# Patient Record
Sex: Female | Born: 1970 | Race: Black or African American | Hispanic: No | Marital: Married | Smoking: Never smoker
Health system: Southern US, Community
[De-identification: ages and names within clinical notes are randomized; demographics above are authoritative.]

## PROBLEM LIST (undated history)

## (undated) DIAGNOSIS — F5104 Psychophysiologic insomnia: Secondary | ICD-10-CM

## (undated) DIAGNOSIS — G47419 Narcolepsy without cataplexy: Secondary | ICD-10-CM

## (undated) DIAGNOSIS — Z8669 Personal history of other diseases of the nervous system and sense organs: Secondary | ICD-10-CM

## (undated) HISTORY — DX: Narcolepsy without cataplexy: G47.419

## (undated) HISTORY — PX: OTHER SURGICAL HISTORY: SHX169

## (undated) HISTORY — DX: Psychophysiologic insomnia: F51.04

## (undated) HISTORY — DX: Personal history of other diseases of the nervous system and sense organs: Z86.69

---

## 1999-12-24 ENCOUNTER — Ambulatory Visit (HOSPITAL_BASED_OUTPATIENT_CLINIC_OR_DEPARTMENT_OTHER): Admission: RE | Admit: 1999-12-24 | Discharge: 1999-12-24 | Payer: Self-pay | Admitting: Internal Medicine

## 2000-01-05 ENCOUNTER — Encounter (INDEPENDENT_AMBULATORY_CARE_PROVIDER_SITE_OTHER): Payer: Self-pay | Admitting: Specialist

## 2000-01-05 ENCOUNTER — Other Ambulatory Visit: Admission: RE | Admit: 2000-01-05 | Discharge: 2000-01-05 | Payer: Self-pay | Admitting: Obstetrics and Gynecology

## 2000-10-25 ENCOUNTER — Other Ambulatory Visit: Admission: RE | Admit: 2000-10-25 | Discharge: 2000-10-25 | Payer: Self-pay | Admitting: Obstetrics and Gynecology

## 2000-11-04 ENCOUNTER — Encounter: Payer: Self-pay | Admitting: Obstetrics and Gynecology

## 2000-11-04 ENCOUNTER — Ambulatory Visit (HOSPITAL_COMMUNITY): Admission: RE | Admit: 2000-11-04 | Discharge: 2000-11-04 | Payer: Self-pay | Admitting: Obstetrics and Gynecology

## 2000-11-29 ENCOUNTER — Ambulatory Visit (HOSPITAL_COMMUNITY): Admission: RE | Admit: 2000-11-29 | Discharge: 2000-11-29 | Payer: Self-pay | Admitting: Obstetrics & Gynecology

## 2000-11-29 ENCOUNTER — Encounter: Payer: Self-pay | Admitting: Obstetrics & Gynecology

## 2001-04-28 ENCOUNTER — Inpatient Hospital Stay (HOSPITAL_COMMUNITY): Admission: AD | Admit: 2001-04-28 | Discharge: 2001-04-28 | Payer: Self-pay | Admitting: Obstetrics and Gynecology

## 2001-05-03 ENCOUNTER — Inpatient Hospital Stay (HOSPITAL_COMMUNITY): Admission: AD | Admit: 2001-05-03 | Discharge: 2001-05-06 | Payer: Self-pay | Admitting: Obstetrics and Gynecology

## 2002-11-27 ENCOUNTER — Other Ambulatory Visit: Admission: RE | Admit: 2002-11-27 | Discharge: 2002-11-27 | Payer: Self-pay | Admitting: Family Medicine

## 2004-08-12 ENCOUNTER — Other Ambulatory Visit: Admission: RE | Admit: 2004-08-12 | Discharge: 2004-08-12 | Payer: Self-pay | Admitting: Family Medicine

## 2005-03-05 ENCOUNTER — Ambulatory Visit: Payer: Self-pay | Admitting: Internal Medicine

## 2005-10-09 ENCOUNTER — Ambulatory Visit: Payer: Self-pay | Admitting: Internal Medicine

## 2007-01-13 ENCOUNTER — Ambulatory Visit: Payer: Self-pay | Admitting: Internal Medicine

## 2007-03-01 DIAGNOSIS — G47419 Narcolepsy without cataplexy: Secondary | ICD-10-CM | POA: Insufficient documentation

## 2007-03-01 DIAGNOSIS — G43909 Migraine, unspecified, not intractable, without status migrainosus: Secondary | ICD-10-CM | POA: Insufficient documentation

## 2007-03-01 DIAGNOSIS — G4700 Insomnia, unspecified: Secondary | ICD-10-CM | POA: Insufficient documentation

## 2007-04-22 ENCOUNTER — Telehealth: Payer: Self-pay | Admitting: Internal Medicine

## 2007-07-07 ENCOUNTER — Telehealth: Payer: Self-pay | Admitting: Internal Medicine

## 2007-09-12 ENCOUNTER — Telehealth (INDEPENDENT_AMBULATORY_CARE_PROVIDER_SITE_OTHER): Payer: Self-pay | Admitting: *Deleted

## 2007-09-21 ENCOUNTER — Encounter: Payer: Self-pay | Admitting: Internal Medicine

## 2007-11-03 ENCOUNTER — Emergency Department (HOSPITAL_COMMUNITY): Admission: EM | Admit: 2007-11-03 | Discharge: 2007-11-03 | Payer: Self-pay | Admitting: Family Medicine

## 2007-11-08 ENCOUNTER — Emergency Department (HOSPITAL_COMMUNITY): Admission: EM | Admit: 2007-11-08 | Discharge: 2007-11-08 | Payer: Self-pay | Admitting: Emergency Medicine

## 2007-11-14 ENCOUNTER — Other Ambulatory Visit: Admission: RE | Admit: 2007-11-14 | Discharge: 2007-11-14 | Payer: Self-pay | Admitting: Family Medicine

## 2007-12-05 ENCOUNTER — Telehealth (INDEPENDENT_AMBULATORY_CARE_PROVIDER_SITE_OTHER): Payer: Self-pay | Admitting: *Deleted

## 2008-01-12 ENCOUNTER — Ambulatory Visit: Payer: Self-pay | Admitting: Internal Medicine

## 2008-01-18 ENCOUNTER — Telehealth (INDEPENDENT_AMBULATORY_CARE_PROVIDER_SITE_OTHER): Payer: Self-pay | Admitting: *Deleted

## 2008-02-10 ENCOUNTER — Encounter: Payer: Self-pay | Admitting: Internal Medicine

## 2008-03-21 ENCOUNTER — Telehealth (INDEPENDENT_AMBULATORY_CARE_PROVIDER_SITE_OTHER): Payer: Self-pay | Admitting: *Deleted

## 2008-06-04 ENCOUNTER — Telehealth: Payer: Self-pay | Admitting: Internal Medicine

## 2008-07-23 ENCOUNTER — Telehealth (INDEPENDENT_AMBULATORY_CARE_PROVIDER_SITE_OTHER): Payer: Self-pay | Admitting: *Deleted

## 2008-07-30 ENCOUNTER — Telehealth (INDEPENDENT_AMBULATORY_CARE_PROVIDER_SITE_OTHER): Payer: Self-pay | Admitting: *Deleted

## 2008-09-18 ENCOUNTER — Telehealth: Payer: Self-pay | Admitting: Internal Medicine

## 2008-10-02 ENCOUNTER — Telehealth (INDEPENDENT_AMBULATORY_CARE_PROVIDER_SITE_OTHER): Payer: Self-pay | Admitting: *Deleted

## 2008-10-17 ENCOUNTER — Telehealth: Payer: Self-pay | Admitting: Internal Medicine

## 2008-12-12 ENCOUNTER — Telehealth (INDEPENDENT_AMBULATORY_CARE_PROVIDER_SITE_OTHER): Payer: Self-pay | Admitting: *Deleted

## 2009-02-18 ENCOUNTER — Telehealth: Payer: Self-pay | Admitting: Internal Medicine

## 2009-02-21 ENCOUNTER — Ambulatory Visit: Payer: Self-pay | Admitting: Internal Medicine

## 2009-02-22 ENCOUNTER — Telehealth: Payer: Self-pay | Admitting: Internal Medicine

## 2009-03-07 ENCOUNTER — Telehealth: Payer: Self-pay | Admitting: Internal Medicine

## 2009-03-11 ENCOUNTER — Encounter: Payer: Self-pay | Admitting: Internal Medicine

## 2009-03-15 ENCOUNTER — Encounter: Payer: Self-pay | Admitting: Internal Medicine

## 2009-03-25 ENCOUNTER — Ambulatory Visit: Payer: Self-pay | Admitting: Internal Medicine

## 2009-04-29 ENCOUNTER — Telehealth: Payer: Self-pay | Admitting: Internal Medicine

## 2009-05-07 ENCOUNTER — Ambulatory Visit (HOSPITAL_COMMUNITY): Admission: RE | Admit: 2009-05-07 | Discharge: 2009-05-07 | Payer: Self-pay | Admitting: Obstetrics and Gynecology

## 2009-05-31 ENCOUNTER — Telehealth (INDEPENDENT_AMBULATORY_CARE_PROVIDER_SITE_OTHER): Payer: Self-pay | Admitting: *Deleted

## 2009-06-11 ENCOUNTER — Telehealth (INDEPENDENT_AMBULATORY_CARE_PROVIDER_SITE_OTHER): Payer: Self-pay | Admitting: *Deleted

## 2009-06-24 ENCOUNTER — Telehealth (INDEPENDENT_AMBULATORY_CARE_PROVIDER_SITE_OTHER): Payer: Self-pay | Admitting: *Deleted

## 2009-07-08 ENCOUNTER — Telehealth: Payer: Self-pay | Admitting: Internal Medicine

## 2009-08-05 ENCOUNTER — Ambulatory Visit: Payer: Self-pay | Admitting: Internal Medicine

## 2009-09-18 ENCOUNTER — Telehealth (INDEPENDENT_AMBULATORY_CARE_PROVIDER_SITE_OTHER): Payer: Self-pay | Admitting: *Deleted

## 2009-10-07 ENCOUNTER — Telehealth (INDEPENDENT_AMBULATORY_CARE_PROVIDER_SITE_OTHER): Payer: Self-pay | Admitting: *Deleted

## 2009-12-24 ENCOUNTER — Telehealth (INDEPENDENT_AMBULATORY_CARE_PROVIDER_SITE_OTHER): Payer: Self-pay | Admitting: *Deleted

## 2010-02-07 ENCOUNTER — Telehealth: Payer: Self-pay | Admitting: Internal Medicine

## 2010-02-10 ENCOUNTER — Telehealth: Payer: Self-pay | Admitting: Internal Medicine

## 2010-03-15 ENCOUNTER — Encounter: Payer: Self-pay | Admitting: Pulmonary Disease

## 2010-03-17 ENCOUNTER — Telehealth: Payer: Self-pay | Admitting: Internal Medicine

## 2010-03-17 ENCOUNTER — Telehealth (INDEPENDENT_AMBULATORY_CARE_PROVIDER_SITE_OTHER): Payer: Self-pay | Admitting: *Deleted

## 2010-03-18 ENCOUNTER — Ambulatory Visit: Payer: Self-pay | Admitting: Internal Medicine

## 2010-06-05 ENCOUNTER — Telehealth (INDEPENDENT_AMBULATORY_CARE_PROVIDER_SITE_OTHER): Payer: Self-pay | Admitting: *Deleted

## 2010-06-16 ENCOUNTER — Ambulatory Visit: Admit: 2010-06-16 | Payer: Self-pay | Admitting: Internal Medicine

## 2010-06-17 ENCOUNTER — Telehealth: Payer: Self-pay | Admitting: Internal Medicine

## 2010-06-17 NOTE — Letter (Signed)
Summary: [employer]  [address]   [place], [place] [address]   Phone: [phone]  Fax: [phone]    Today's Date: August 05, 2009  Name of Patient: Denise Herrera  The above named patient had a medical visit today at:  1:30 pm.  Please take this into consideration when reviewing the time away from work.    Special Instructions:  [ X ] None  [  ] To be off the remainder of today, returning to the normal work / school schedule tomorrow.  [  ] To be off until the next scheduled appointment on ______________________.  [  ] Other ________________________________________________________________ ________________________________________________________________________   Sincerely yours,     Jetty Duhamel, M.D.

## 2010-06-17 NOTE — Progress Notes (Signed)
Summary: nos appt  Phone Note Call from Patient   Caller: [email or web address]  Call For: young Summary of Call: ATC pt to rsc nos from 9/22, phone disconnected. Initial call taken by: Darletta Moll,  February 07, 2010 10:13 AM

## 2010-06-17 NOTE — Progress Notes (Signed)
Summary: written rx  Phone Note Call from Patient   Caller: Patient--[phone] Call For: young Reason for Call: Talk to Nurse Summary of Call: Patient needing written rx for methylphenidate 5 mg and 20mg .  Will pick up when ready. Initial call taken by: Lehman Prom,  March 17, 2010 9:49 AM  Follow-up for Phone Call        Pt called Dr clance over the weekend requesting refills for this.  He denied due to 2 no show appts with Dr Maple Hudson.  Pt scheduled ov with Dr Maple Hudson tomorrow 03-18-10.  Do you want to wait until then to fill rx or print them out today?  Please advise. Abigail Miyamoto RN  March 17, 2010 10:56 AM   Additional Follow-up for Phone Call Additional follow up Details #1::        keep appointment Additional Follow-up by: Waymon Budge MD,  March 17, 2010 12:36 PM    Additional Follow-up for Phone Call Additional follow up Details #2::    LM for pt that Dr Maple Hudson would address new rx's at [facility] on 03-18-10. Abigail Miyamoto RN  March 17, 2010 12:40 PM

## 2010-06-17 NOTE — Progress Notes (Signed)
Summary: requests rx now-(see last msg)  Phone Note Call from Patient   Caller: Patient Call For: young Summary of Call: pt is aware that dr young will address her rx for methyphenidate at appt tomorrow- however, she says that since she is out she must have this today- can't work without this. pt# [phone] Initial call taken by: Tivis Ringer, CNA,  March 17, 2010 2:50 PM  Follow-up for Phone Call        called spoke with patient who states that she worked this past saturday, using the last dose of methylphenidate.  states she is unable to work without it, and goes to work tomorrow morning @ 6am.  would like to know if she may get quantity #1 of the methylphenidate for work tomorrow.  CDY please advise, thanks! Follow-up by: Boone Master CNA/MA,  March 17, 2010 3:57 PM  Additional Follow-up for Phone Call Additional follow up Details #1::        Seen in office. Additional Follow-up by: Waymon Budge MD,  March 19, 2010 8:51 AM

## 2010-06-17 NOTE — Progress Notes (Signed)
Summary: refills  Phone Note Call from Patient Call back at [phone]   Caller: Patient Call For: young Reason for Call: Refill Medication Summary of Call: Need rx for methylphenidate hcl cr 20mg  and methylphenidate hcl 5mg , pt states she is completely out and can't work without it. Initial call taken by: Darletta Moll,  Oct 07, 2009 3:26 PM  Follow-up for Phone Call        Spoke with pt.  She states that she would like to come pick up rx asap, she is completely out.  Pls advise when signed, thanks  Follow-up by: Vernie Murders,  Oct 07, 2009 3:35 PM  Additional Follow-up for Phone Call Additional follow up Details #1::        left message that phone note is complete and if any questions or concerns to call our office.[name] CMA  Oct 07, 2009 4:45 PM    RX is at front for pick up .[name] CMA  Oct 07, 2009 4:45 PM     Prescriptions: METHYLPHENIDATE HCL 5 MG  TABS (METHYLPHENIDATE HCL) 3 two times a day prn  #180 x 0   Entered by:   Vernie Murders   Authorized by:   Waymon Budge MD   Signed by:   Vernie Murders on 10/07/2009   Method used:   Print then Give to Patient   RxID:   [id number] METHYLPHENIDATE HCL CR 20 MG  TBCR (METHYLPHENIDATE HCL) twice daily prn  #60 x 0   Entered by:   Vernie Murders   Authorized by:   Waymon Budge MD   Signed by:   Vernie Murders on 10/07/2009   Method used:   Print then Give to Patient   RxID:   [id number]

## 2010-06-17 NOTE — Progress Notes (Signed)
Summary: PRESCRIPT  Phone Note Call from Patient   Caller: Patient Call For: Denise Herrera Summary of Call: PT NEED WRITTEN PRESCRIPT FOR RITALIN 2 AND 5MG S PT WILL PICK UP Initial call taken by: Rickard Patience,  July 08, 2009 11:07 AM  Follow-up for Phone Call        rx printed and palced on CY look-at. 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

## 2010-06-17 NOTE — Progress Notes (Signed)
Summary: Remus Loffler cr refill   Phone Note Call from Patient Call back at [phone]   Caller: Patient Call For: [name] Summary of Call: NEED AMBIEM PRESCRIPT REFILL [facility] Initial call taken by: Rickard Patience,  Sep 18, 2009 9:50 AM  Follow-up for Phone Call        Pleae advise if okay to refill Ambien CR for patient. Michel Bickers CMA  Sep 18, 2009 10:43 AM Allergies (verified):  No Known Drug Allergies     Additional Follow-up for Phone Call Additional follow up Details #1::        Per DrYoung, okay to refill ambien CR.  I called and spoke with pharmacist and okayed refill x 1 only.  LMOM for pt to be aware this was done. Additional Follow-up by: Vernie Murders,  Sep 18, 2009 3:35 PM    Prescriptions: AMBIEN CR 12.5 MG CR-TABS (ZOLPIDEM TARTRATE) 1 for sleep if needded  #30 x 0   Entered by:   Vernie Murders   Authorized by:   Waymon Budge MD   Signed by:   Vernie Murders on 09/18/2009   Method used:   Telephoned to ...       [facility] [id number] (retail)       [address] [place], [place]  [address]       Ph: [phone]       Fax: [phone]   RxID:   [id number]

## 2010-06-17 NOTE — Progress Notes (Signed)
Summary: rx request  Phone Note Call from Patient   Caller: Patient Call For: Denise Herrera Summary of Call: pt wants to pick up both rx's for METHYLPHENIDATE HCL CR 20mg  and 5mg . call pt at [phone] Initial call taken by: Tivis Ringer, CNA,  December 24, 2009 11:22 AM  Follow-up for Phone Call        Methylphenidate CR 20mg  last sent rx 10-07-2009 for # 60 x 0 refills.  Methylphenidate 5mg  last sent 10-07-2009 for # 180 x0 refills.  Pt last seen 08-05-2009.  Pending appt for 02-06-2010.  Will forward message to CY to address.  Aundra Millet Reynolds LPN  December 24, 2009 11:32 AM   Additional Follow-up for Phone Call Additional follow up Details #1::        Ok to print for my signature Additional Follow-up by: Waymon Budge MD,  December 24, 2009 1:02 PM    Additional Follow-up for Phone Call Additional follow up Details #2::    printed rx and put on CY's desk to sign.  Aundra Millet Reynolds LPN  December 24, 2009 1:14 PM     RX's at front for pick up and left message for pt to pick up during office hours.[name] CMA  December 24, 2009 4:56 PM   Prescriptions: METHYLPHENIDATE HCL 5 MG  TABS (METHYLPHENIDATE HCL) 3 two times a day prn  #180 x 0   Entered by:   Arman Filter LPN   Authorized by:   Waymon Budge MD   Signed by:   Arman Filter LPN on 27/25/3664   Method used:   Print then Give to Patient   RxID:   [id number] METHYLPHENIDATE HCL CR 20 MG  TBCR (METHYLPHENIDATE HCL) twice daily prn  #60 x 0   Entered by:   Arman Filter LPN   Authorized by:   Waymon Budge MD   Signed by:   Arman Filter LPN on 43/32/9518   Method used:   Print then Give to Patient   RxID:   [id number]

## 2010-06-17 NOTE — Progress Notes (Signed)
Summary: prescription  Phone Note Call from Patient Call back at Home Phone [phone]   Caller: Patient Call For: young Summary of Call: Wants to know the status of her ambien. Initial call taken by: Darletta Moll,  June 24, 2009 9:50 AM  Follow-up for Phone Call        lmtcb  Philipp Deputy [facility]  June 24, 2009 2:11 PM   pt called back - please return her call. Follow-up by: Eugene Gavia,  June 24, 2009 3:18 PM  Additional Follow-up for Phone Call Additional follow up Details #1::        pt was unaware med was sent to pharmacy, she will go to pick this up if it needs prior auth she will have the pharmacy fax info to [place] for this Additional Follow-up by: Philipp Deputy CMA,  June 24, 2009 3:59 PM

## 2010-06-17 NOTE — Progress Notes (Signed)
Summary: med not working  Phone Note Call from Patient   Caller: Patient Call For: young Summary of Call: silenor is not helping her sleep Initial call taken by: Rickard Patience,  June 11, 2009 3:03 PM  Follow-up for Phone Call        We changed zolpidem to silenor on 1/14.  Pt states that this is not helping with sleep.  Please advise, thanks!  Additional Follow-up for Phone Call Additional follow up Details #1::        Please ask her what sleep med works best for her. Additional Follow-up by: Waymon Budge MD,  June 11, 2009 4:12 PM    Additional Follow-up for Phone Call Additional follow up Details #2::    Spoke with pt.  She states that the only thing that she has tried for sleep that has helped was [place] cr.   Vernie Murders  June 11, 2009 4:20 PM   Additional Follow-up for Phone Call Additional follow up Details #3:: Details for Additional Follow-up Action Taken: We can Rx Ambien CR 12.5 mg, # 30, 1 for sleep if needed. Ref x 1. There is no generic.  called and spoke with pt.  pt aware rx sent to pharmacy.  Aundra Millet Reynolds LPN  June 11, 2009 4:40 PM  Additional Follow-up by: Waymon Budge MD,  June 11, 2009 4:30 PM  New/Updated Medications: AMBIEN CR 12.5 MG CR-TABS (ZOLPIDEM TARTRATE) 1 for sleep if needded Prescriptions: AMBIEN CR 12.5 MG CR-TABS (ZOLPIDEM TARTRATE) 1 for sleep if needded  #30 x 1   Entered by:   Arman Filter LPN   Authorized by:   Waymon Budge MD   Signed by:   Arman Filter LPN on 16/02/9603   Method used:   Telephoned to ...       [facility] [id number] (retail)       [address] [place], [place]  [address]       Ph: [phone]       Fax: [phone]   RxID:   [id number] AMBIEN CR 12.5 MG CR-TABS (ZOLPIDEM TARTRATE) 1 for sleep if needded  #30 x 1   Entered by:   Waymon Budge MD   Authorized by:   Pulmonary Triage   Signed by:   Waymon Budge MD on 06/11/2009   Method  used:   Historical   RxID:   [id number]

## 2010-06-17 NOTE — Progress Notes (Signed)
Summary: prescript-LMTCB x 1  Phone Note Call from Patient   Caller: Patient Call For: young Summary of Call: generic Evon Slack is not strong enough [facility] Initial call taken by: Rickard Patience,  May 31, 2009 4:11 PM  Follow-up for Phone Call        called, spoke with pt.  Pt states zoldipem one tablet is not working.  states she is still getting up in the middle of the night.  Requesting CY's recs.  Will forward to CY-please advise. Thanks! Follow-up by: Gweneth Dimitri RN,  May 31, 2009 5:25 PM  Additional Follow-up for Phone Call Additional follow up Details #1::        I have replaced zolpidem for sleep on her medlist with Silenor. Please let her try that. Additional Follow-up by: Waymon Budge MD,  June 03, 2009 9:59 AM    Additional Follow-up for Phone Call Additional follow up Details #2::    LMOMTCB Vernie Murders  June 03, 2009 10:01 AM  Spoke with pt and made aware that we called in  rx for silenor 6 mg.   Follow-up by: Vernie Murders,  June 03, 2009 3:30 PM  New/Updated Medications: SILENOR 6 MG TABS (DOXEPIN HCL) 1 for sleep as needed Prescriptions: SILENOR 6 MG TABS (DOXEPIN HCL) 1 for sleep as needed  #30 x 5   Entered by:   Vernie Murders   Authorized by:   Waymon Budge MD   Signed by:   Vernie Murders on 06/03/2009   Method used:   Telephoned to ...       [facility] [id number] (retail)       [address] [place], [place]  [address]       Ph: [phone]       Fax: [phone]   RxID:   [id number] SILENOR 6 MG TABS (DOXEPIN HCL) 1 for sleep as needed  #30 x 5   Entered by:   Waymon Budge MD   Authorized by:   Pulmonary Triage   Signed by:   Waymon Budge MD on 06/03/2009   Method used:   Historical   RxID:   [id number]

## 2010-06-17 NOTE — Letter (Signed)
Summary: Out of Work  [employer]  [address]   [place], [place] [address]   Phone: [phone]  Fax: [phone]    March 18, 2010   Employee:  Denise Herrera    To Whom It May Concern:   For Medical reasons, please excuse the above named employee from work for the following dates:  Start:   Tuesday 03-18-10  End:   Tuesday 03-18-10  Pt can return to work on Wednesday 03-19-10.  If you need additional information, please feel free to contact our office.         Sincerely,     Jason Coop

## 2010-06-17 NOTE — Progress Notes (Signed)
Summary: nos appt  Phone Note Call from Patient   Caller: [email or web address]  Call For: young Summary of Call: Rsc nos from 9/22 to 11/1 @ 4p. Initial call taken by: Darletta Moll,  February 10, 2010 10:34 AM

## 2010-06-17 NOTE — Assessment & Plan Note (Signed)
Summary: rov/apc   Primary Provider/Referring Provider:  Tally Joe  CC:  Follow up  and pt states Remus Loffler is working well.  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 Ritalin RX; does not like Temazepam (breaks through) Takes less ritalin. Feels edgy. Taking Rit 20 Cr once or twice daily. Some days takes 5mg  instead of the second 20. Bedtime around 10PM. latency varies, but longer with temazepam than with ambien before. Wakes or 1AM wanting to eat, then may wake again around 2AM for awhiile. Snores without apnea per husband; no kicking or complicated sleep behavior. She got demoted for being too snappy. Frequent headaches- chronic. Denies chest pain, palpitation or hx of seizure.  March 25, 2009- Narcolepsy wo cataplexy, chronic insomnia, hx Migraine "Fine= making it". Both Nuvigil 150 and 250 seemed to keep her calmer, probably meaning it didn't give the jangle of ritalin. She thinks she knows how to manage ritalin to avoid overstimulation. She denies chest pain or palpitation. Headache is chronic and variable, mostly related to menses- she is working on this with her gyn.  August 05, 2009- Narcolepsy w/o cataplexy, chronic insomnia, hx Migraine She had found that Ambien CR was really the best available for the chronic insomnia aspect of her sleep apnea. Work shift change. Gets up at 4AM to be at work  by 6AM. We estimated bedtime should be 830-9PM to get 7-8 hours sleep. Denies sleep walking with ambien.  Headaches remain frequent.     Current Medications (verified): 1)  Methylphenidate Hcl Cr 20 Mg  Tbcr (Methylphenidate Hcl) .... Twice Daily Prn 2)  Methylphenidate Hcl 5 Mg  Tabs (Methylphenidate Hcl) .... 3 Two Times A Day Prn 3)  Ambien Cr 12.5 Mg Cr-Tabs (Zolpidem Tartrate) .Marland Kitchen.. 1 For Sleep If Needded 4)  Zyrtec Allergy 10 Mg Tabs (Cetirizine Hcl) .Marland Kitchen.. 1 Once Daily 5)  Ibuprofen 800 Mg Tabs  (Ibuprofen) .Marland Kitchen.. 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) INSOMNIA, CHRONIC (ICD-307.42) Hx of MIGRAINE HEADACHE (ICD-346.90)  Past Surgical History: Last updated: 03/25/2009 C-section  Family History: Last updated: 01/12/2008 mother alive age 27 1 sibling alive age 74 1 sibling alive age 69 1 aunt had breast cancer grandparents had DM son has asthma  Social History: Last updated: 02/21/2009 non smoker Works making medical hose and prostheses exercises 1-2 times per week caffeine use:  2 cups daily married 3 children  Review of Systems      See HPI  The patient denies anorexia, fever, weight loss, weight gain, vision loss, decreased hearing, hoarseness, chest pain, syncope, dyspnea on exertion, peripheral edema, prolonged cough, headaches, hemoptysis, abdominal pain, and severe indigestion/heartburn.    Vital Signs:  Patient profile:   40 year old female Height:      643 inches Weight:      170 pounds BMI:     0.29 O2 Sat:      99 % on Room air Pulse rate:   107 / minute BP sitting:   130 / 86  (left arm)  Vitals Entered By: Renold Genta RCP, LPN (August 05, 2009 1:53 PM)  O2 Flow:  Room air CC: Follow up , pt states Remus Loffler is working well Comments Medications reviewed with patient Renold Genta RCP, LPN  August 05, 2009 2:00 PM    Physical Exam  Additional Exam:  General: A/Ox3; pleasant and cooperative, NAD, alert and appropriate, calm SKIN: no rash, lesions. 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  Problem # 2:  INSOMNIA, CHRONIC (ICD-307.42) I believe this reflects the sleep fragmentation common with  narcolepsy syndromes. She is tolerating and satisfied with her use of Ambien.  Medications Added to Medication List This Visit: 1)  Zyrtec Allergy 10 Mg Tabs (Cetirizine hcl) .Marland Kitchen.. 1 once daily 2)  Ibuprofen 800 Mg Tabs (Ibuprofen) .Marland Kitchen.. 1  as needed  Other Orders: Est. Patient Level III ([address])  Patient Instructions: 1)  Please schedule a follow-up appointment in 6 months. 2)  Consider asking Dr Azucena Cecil to refer you to a headache specialist like Dr Amelia Jo. 3)  Call as need for med refills.

## 2010-06-17 NOTE — Miscellaneous (Signed)
Summary: weekend phone call  Clinical Lists Changes pt called this weekend wanting refill on her methylphenidate.  I have noted in computer that she no-showed for 2 visit in september, and reminded her of this.  I have explained to her that medication requires a written prescription, and cannot be called in or sent electronically.  I have asked her to call monday about her refills.

## 2010-06-17 NOTE — Assessment & Plan Note (Signed)
Summary: f10/jd   Primary Duncan Alejandro/Referring Jermisha Hoffart:  Tally Joe  CC:  follow up visit-insomnia.  History of Present Illness:  March 25, 2009- Narcolepsy wo cataplexy, chronic insomnia, hx Migraine "Fine= making it". Both Nuvigil 150 and 250 seemed to keep her calmer, probably meaning it didn't give the jangle of ritalin. She thinks she knows how to manage ritalin to avoid overstimulation. She denies chest pain or palpitation. Headache is chronic and variable, mostly related to menses- she is working on this with her gyn.  August 05, 2009- Narcolepsy w/o cataplexy, chronic insomnia, hx Migraine She had found that Ambien CR was really the best available for the chronic insomnia aspect of her sleep apnea. Work shift change. Gets up at 4AM to be at work  by 6AM. We estimated bedtime should be 830-9PM to get 7-8 hours sleep. Denies sleep walking with ambien.  Headaches remain frequent.   March 18, 2010-  Narcolepsy w/o cataplexy, chronic insomnia, hx Migraine Nurse-CC: follow up visit-insomnia Unable to go to work today- too tired since she was unable to get methylphenidate. We reviewed her need for meds, sleep hygiene. She has not abused meds and they have permitted normal life style. We discussed the significant supply line problems currently for these meds.    Preventive Screening-Counseling & Management  Alcohol-Tobacco     Smoking Status: never  Current Medications (verified): 1)  Methylphenidate Hcl Cr 20 Mg  Tbcr (Methylphenidate Hcl) .... Twice Daily Prn 2)  Methylphenidate Hcl 5 Mg  Tabs (Methylphenidate Hcl) .... 3 Two Times A Day Prn 3)  Ambien Cr 12.5 Mg Cr-Tabs (Zolpidem Tartrate) .Marland Kitchen.. 1 For Sleep If Needded 4)  Zyrtec Allergy 10 Mg Tabs (Cetirizine Hcl) .Marland Kitchen.. 1 Once Daily 5)  Maxalt 10 Mg Tabs (Rizatriptan Benzoate) .... Take 1 By Mouth Once Daily As Needed Migraine 6)  Imipramine Hcl 25 Mg Tabs (Imipramine Hcl) .... 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    Vital Signs:  Patient profile:   40 year old female Height:      64 inches Weight:      165.25 pounds BMI:     28.47 O2 Sat:      100 % on Room air Pulse rate:   96 / minute BP sitting:   138 / 96  (left arm) Cuff size:   regular  Vitals Entered By: [name] CMA (March 18, 2010 4:17 PM)  O2 Flow:  Room air  Physical Exam  Additional Exam:  General: A/Ox3; pleasant and cooperative, NAD, alert and appropriate, calm SKIN:  no rash, lesions. 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 She has been very compliant with med use. We will rewrite her current scripts and let her try alternative drug stores. We will also give samples of Nuvigil to try as an alternative. She thinks the 150 mg samples before weren't strong enough.   Problem # 2:  INSOMNIA, CHRONIC (ICD-307.42)  This is part of the usual problem with narcoleptics not being able to remain asleep or awake. Usually Lowella Curb has helped her and been well tolerated. She continueds with attention to naps and sleep hygiene..   Medications Added to Medication List This Visit: 1)  Maxalt 10 Mg Tabs (Rizatriptan benzoate) .... Take 1 by mouth once daily as needed migraine 2)  Imipramine Hcl 25 Mg Tabs (Imipramine hcl) .... Take 1 by mouth once daily  Other Orders: Est. Patient Level III (11914)  Patient Instructions: 1)  Please schedule a follow-up appointment in 3 months. 2)  Scripts to try refilling your methylphenidate scripts 3)  Samples Nuvigil 250 mg, 1 daily as needed 4)  Work note- medical LOA for today Prescriptions: METHYLPHENIDATE HCL 5 MG  TABS (METHYLPHENIDATE HCL) 3 two times a day prn  #180 x 0   Entered and Authorized by:   Waymon Budge MD   Signed by:   Waymon Budge MD on 03/18/2010   Method used:   Print then Give to Patient   RxID:   [id number] METHYLPHENIDATE HCL CR 20 MG  TBCR (METHYLPHENIDATE HCL) twice daily prn  #60 x 0   Entered and Authorized by:   Waymon Budge MD   Signed by:   Waymon Budge MD on 03/18/2010   Method used:   Print then Give to Patient   RxID:   [id number]

## 2010-06-19 NOTE — Progress Notes (Signed)
Summary: refills  Phone Note Call from Patient Call back at Home Phone [phone]   Caller: Patient Call For: young Reason for Call: Refill Medication Summary of Call: Requests written rx for methylphenidate hcl cr 20mg  and methylphenidate hcl 5mg  will pick up. Initial call taken by: Darletta Moll,  June 05, 2010 11:35 AM  Follow-up for Phone Call        rx printed. pt last seen 11-11. Rx placed on CY look-at to sign. pt will pick-up. Carron Curie CMA  June 05, 2010 12:18 PM   Additional Follow-up for Phone Call Additional follow up Details #1::        Rx is signed and at front desk for patient to pick up-attempted to call patient at given number-left mess about this.If any questions/concerns then call the office.[name] CMA  June 05, 2010 2:07 PM     Prescriptions: METHYLPHENIDATE HCL 5 MG  TABS (METHYLPHENIDATE HCL) 3 two times a day prn  #180 x 0   Entered by:   Carron Curie CMA   Authorized by:   Waymon Budge MD   Signed by:   Carron Curie CMA on 06/05/2010   Method used:   Print then Give to Patient   RxID:   [id number] METHYLPHENIDATE HCL CR 20 MG  TBCR (METHYLPHENIDATE HCL) twice daily prn  #60 x 0   Entered by:   Carron Curie CMA   Authorized by:   Waymon Budge MD   Signed by:   Carron Curie CMA on 06/05/2010   Method used:   Print then Give to Patient   RxID:   [id number]

## 2010-06-25 NOTE — Progress Notes (Signed)
Summary: nos appt  Phone Note Call from Patient   Caller: [email or web address]  Call For: young Summary of Call: Rsc nos from 1/30 to 3/6. Initial call taken by: Darletta Moll,  June 17, 2010 3:45 PM

## 2010-07-22 ENCOUNTER — Ambulatory Visit (INDEPENDENT_AMBULATORY_CARE_PROVIDER_SITE_OTHER): Payer: BC Managed Care – PPO | Admitting: Internal Medicine

## 2010-07-22 ENCOUNTER — Encounter: Payer: Self-pay | Admitting: Internal Medicine

## 2010-07-22 DIAGNOSIS — G43909 Migraine, unspecified, not intractable, without status migrainosus: Secondary | ICD-10-CM

## 2010-07-22 DIAGNOSIS — G47419 Narcolepsy without cataplexy: Secondary | ICD-10-CM

## 2010-07-22 DIAGNOSIS — G4700 Insomnia, unspecified: Secondary | ICD-10-CM

## 2010-07-29 NOTE — Assessment & Plan Note (Signed)
Summary: rov ///kp   Primary Provider/Referring Provider:  Tally Joe  CC:  follow up visit-narcolepsy.  History of Present Illness: August 05, 2009- Narcolepsy w/o cataplexy, chronic insomnia, hx Migraine She had found that Ambien CR was really the best available for the chronic insomnia aspect of her sleep apnea. Work shift change. Gets up at 4AM to be at work  by 6AM. We estimated bedtime should be 830-9PM to get 7-8 hours sleep. Denies sleep walking with ambien.  Headaches remain frequent.   March 18, 2010-  Narcolepsy w/o cataplexy, chronic insomnia, hx Migraine Nurse-CC: follow up visit-insomnia Unable to go to work today- too tired since she was unable to get methylphenidate. We reviewed her need for meds, sleep hygiene. She has not abused meds and they have permitted normal life style. We discussed the significant supply line problems currently for these meds.   July 22, 2010- Narcolepsy w/o cataplexy, chronic insomnia, hx Migraine Nurse-CC: follow up visit-narcolepsy Imipramine given by her headache doctor along with ambien Cr still doesn't help long sleep latencies after HS at 10PM and she still wakes at night if she just takes the imipramine.  Currently she is taking methylphen 20 CR two times a day on work days, none on weekends. . When she has the 5 mg tabs, she also tries to take one of those two times a day as well..     Preventive Screening-Counseling & Management  Alcohol-Tobacco     Smoking Status: never  Current Medications (verified): 1)  Methylphenidate Hcl Cr 20 Mg  Tbcr (Methylphenidate Hcl) .... Twice Daily Prn 2)  Methylphenidate Hcl 5 Mg  Tabs (Methylphenidate Hcl) .... 3 Two Times A Day Prn 3)  Zyrtec Allergy 10 Mg Tabs (Cetirizine Hcl) .Marland Kitchen.. 1 Once Daily 4)  Maxalt 10 Mg Tabs (Rizatriptan Benzoate) .... Take 1 By Mouth Once Daily As Needed Migraine 5)  Imipramine Hcl 25 Mg Tabs (Imipramine Hcl) .... Take 1 By Mouth Once Daily  Allergies  (verified): 1)  ! * Ambien 2)  ! 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  The patient denies anorexia, fever, weight loss, weight gain, vision loss, decreased hearing, hoarseness, chest pain, syncope, dyspnea on exertion, peripheral edema, prolonged cough, hemoptysis, abdominal pain, melena, severe indigestion/heartburn, unusual weight change, abnormal bleeding, enlarged lymph nodes, and angioedema.    Vital Signs:  Patient profile:   40 year old female Height:      64 inches Weight:      167.13 pounds BMI:     28.79 O2 Sat:      100 % on Room air Pulse rate:   93 / minute BP sitting:   160 / 100  (left arm) Cuff size:   regular  Vitals Entered By: [name] CMA (July 22, 2010 3:32 PM)  O2  Flow:  Room air CC: follow up visit-narcolepsy Comments Pt states she is having a migraine at this time-hence the BP increased.[name] CMA  July 22, 2010 3:32 PM    Physical Exam  Additional Exam:  General: A/Ox3; pleasant and cooperative, NAD, alert and appropriate, calm SKIN: no rash, lesions. 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 She manages them appropriately. Narcolepsy is often associated with as much waking at night as sleepiness in the day. We discussed sleep hygiene and the importance of not letting her stimulant meds carry over into bedtime.   Problem # 2:  INSOMNIA, CHRONIC (ICD-307.42) we explored use of ambien and imipramine for help consolidating sleep.   Problem # 3:  Hx of MIGRAINE HEADACHE (ICD-346.90)  She came with headache and just took Excedrin before coming over. we discussed that in relation to her BP which  is up today. Advised she review BP with Dr Azucena Cecil.  Her updated medication list for this problem includes:    Maxalt 10 Mg Tabs (Rizatriptan benzoate) .Marland Kitchen... Take 1 by mouth once daily as needed migraine  Orders: Est. Patient Level III (45409)  Medications Added to Medication List This Visit: 1)  Ambien Cr 12.5 Mg Cr-tabs (Zolpidem tartrate) .Marland Kitchen.. 1 for sleep if needed  Patient Instructions: 1)  Please schedule a follow-up appointment in 6 months. 2)  script for Ambien CR 3)  Please watch your BP and if it still gets higher than 130/90, please talk to Dr Azucena Cecil about it.  Prescriptions: AMBIEN CR 12.5 MG CR-TABS (ZOLPIDEM TARTRATE) 1 for sleep if needed  #30 x 5   Entered and Authorized by:   Waymon Budge MD   Signed by:   Waymon Budge MD on 07/22/2010   Method used:   Print then Give to Patient   RxID:   [id number]

## 2010-08-18 LAB — CBC
HCT: 39 % (ref 36.0–46.0)
Hemoglobin: 13.3 g/dL (ref 12.0–15.0)
MCHC: 34.1 g/dL (ref 30.0–36.0)
MCV: 91.5 fL (ref 78.0–100.0)
Platelets: 341 10*3/uL (ref 150–400)
RBC: 4.26 MIL/uL (ref 3.87–5.11)
RDW: 13 % (ref 11.5–15.5)
WBC: 4.9 10*3/uL (ref 4.0–10.5)

## 2010-09-01 ENCOUNTER — Telehealth: Payer: Self-pay | Admitting: Internal Medicine

## 2010-09-01 MED ORDER — METHYLPHENIDATE HCL ER 20 MG PO TBCR: EXTENDED_RELEASE_TABLET | ORAL | Status: DC

## 2010-09-01 MED ORDER — METHYLPHENIDATE HCL 5 MG PO TABS: ORAL_TABLET | ORAL | Status: DC

## 2010-09-01 NOTE — Telephone Encounter (Signed)
I called and left message that Rx was at front for pick up as requested. If any questions or concerns then patient to call the office.Denise Herrera

## 2010-09-01 NOTE — Telephone Encounter (Signed)
Have printed off rx for pt and was placed on cdy cart for signature.   Carver Fila, CMA

## 2010-09-30 NOTE — Assessment & Plan Note (Signed)
[facility]                             PULMONARY OFFICE NOTE   Denise Herrera, Denise Herrera                         MRN:          [id number]  DATE:01/13/2007                            DOB:          1970-05-23    PROBLEM LIST:  1. Narcolepsy.  2. Insomnia complaint is a component of her narcolepsy.  3. History of migraine.   HISTORY:  Ritalin works well and makes it possible for her to work and  drive comfortably.  She does not usually need naps.  No sleep paralysis  or cataplexy.  Occasional migraines like before, not any worse with  these medicines.  Occasionally uses Ambien to help with the typical  fragmented nighttime sleep of narcolepsy.  She is aware that she  sometimes jerks a little in sleep, apparently not much, and has been  told that she talks in her sleep occasionally.  We talked about that as  possible issue with her Ambien.  It does not disturb her.  We again  discussed the risks and safety issues of using Ritalin and its  controlled drug status.   MEDICATIONS:  1. Generic Ritalin 20 mg SR, used once or sometimes twice daily.  2. Ritalin 5 mg used as a supplement intermittently if needed.   OBJECTIVE:  VITAL SIGNS: Weight 169 pounds.  Blood pressure 124/80,  pulse 103, room air saturation 100%.  CARDIAC:  Heart sounds are regular without murmur.  NEUROLOGIC:  Affect is calm.  There is no tremor.  NECK:  Thyroid is not enlarged.   IMPRESSION:  Narcolepsy with insomnia and hypersomnia.   PLAN:  1. Continue Ritalin.  2. May use p.r.n. Ambien.  3. We discussed sleep hygiene, naps, and emphasized her responsibility      to be safe and alert while driving.  4. Schedule return 1 year, earlier p.r.n.     Clinton D. Maple Hudson, MD, Tonny Bollman, FACP  Electronically Signed    CDY/MedQ  DD: 01/17/2007  DT: 01/17/2007  Job #: [id number]   cc:   Deboraha Sprang at [facility]

## 2010-12-04 ENCOUNTER — Telehealth: Payer: Self-pay | Admitting: Internal Medicine

## 2010-12-04 MED ORDER — METHYLPHENIDATE HCL ER 20 MG PO TBCR: EXTENDED_RELEASE_TABLET | ORAL | Status: DC

## 2010-12-04 MED ORDER — METHYLPHENIDATE HCL 5 MG PO TABS: ORAL_TABLET | ORAL | Status: DC

## 2010-12-04 NOTE — Telephone Encounter (Signed)
Pt came by the office; she needs a 30 day supply. Rx printed, signed by CY, and given to patient while in the office.

## 2010-12-04 NOTE — Telephone Encounter (Signed)
?   90 or 30 day supply- LMTCB

## 2011-01-22 ENCOUNTER — Ambulatory Visit (INDEPENDENT_AMBULATORY_CARE_PROVIDER_SITE_OTHER): Payer: BC Managed Care – PPO | Admitting: Internal Medicine

## 2011-01-22 ENCOUNTER — Encounter: Payer: Self-pay | Admitting: Internal Medicine

## 2011-01-22 VITALS — BP 124/82 | HR 68 | Ht 64.0 in | Wt 167.6 lb

## 2011-01-22 DIAGNOSIS — G4700 Insomnia, unspecified: Secondary | ICD-10-CM

## 2011-01-22 DIAGNOSIS — G47419 Narcolepsy without cataplexy: Secondary | ICD-10-CM

## 2011-01-22 NOTE — Progress Notes (Signed)
Subjective:    Patient ID: Denise Herrera, female    DOB: [birth year], 40 y.o.   MRN: [id number]  HPI 01/22/11- 40 year old female never smoker followed for narcolepsy without cataplexy, chronic insomnia, history of migraine. Last here July 22, 2010 Sleep is ok. Still occasional nap mid-day. Sleeps through night. Metadate 20ER - daily, methylphenidate 5mg  - 1 in AM, 2 at lunch. Usually skips Saturdays and Sundays. Menses make HA worse, but her stimulants seem to not affect them. Denies my descriptions of sleep paralysis, cataplexy and hypnagogic hallucination.  Denies chest pain, palpitation, inappropriate anger, Working with headache specialist trying tofranil and pain meds.  Review of Systems Constitutional:   No-   weight loss, night sweats, fevers, chills, fatigue, lassitude. HEENT:   No-  headaches, difficulty swallowing, tooth/dental problems, sore throat,       No-  sneezing, itching, ear ache, nasal congestion, post nasal drip,  CV:  No-   chest pain, orthopnea, PND, swelling in lower extremities, anasarca, dizziness, palpitations Resp: No-   shortness of breath with exertion or at rest.              No-   productive cough,  No non-productive cough,  No-  coughing up of blood.              No-   change in color of mucus.  No- wheezing.   Skin: No-   rash or lesions. GI:  No-   heartburn, indigestion, abdominal pain, nausea, vomiting, diarrhea,                 change in bowel habits, loss of appetite GU: No-   dysuria, change in color of urine, no urgency or frequency.  No- flank pain. MS:  No-   joint pain or swelling.  No- decreased range of motion.  No- back pain. Neuro- grossly normal to observation, Or:  Psych:  No- change in mood or affect. No depression or anxiety.  No memory loss.      Objective:   Physical Exam General- Alert, Oriented, Affect-appropriate, Distress- none acute  Medium build Skin- rash-none, lesions- none, excoriation- none; tatoos Lymphadenopathy-  none Head- atraumatic            Eyes- Gross vision intact, PERRLA, conjunctivae clear secretions            Ears- Hearing, canals normal            Nose- Clear, NoSeptal dev, mucus, polyps, erosion, perforation             Throat- Mallampati II , mucosa clear , drainage- none, tonsils- atrophic Neck- flexible , trachea midline, no stridor , thyroid nl, carotid no bruit Chest - symmetrical excursion , unlabored           Heart/CV- RRR , no murmur , no gallop  , no rub, nl s1 s2                           - JVD- none , edema- none, stasis changes- none, varices- none           Lung- clear to P&A, wheeze- none, cough- none , dullness-none, rub- none           Chest wall-  Abd- tender-no, distended-no, bowel sounds-present, HSM- no Br/ Gen/ Rectal- Not done, not indicated Extrem- cyanosis- none, clubbing, none, atrophy- none, strength- nl Neuro- grossly intact to observation  Assessment & Plan:

## 2011-01-22 NOTE — Patient Instructions (Addendum)
Please call for refills and as needed

## 2011-01-22 NOTE — Assessment & Plan Note (Signed)
Good control. She is used to these meds and needs no changes after discussion. She will continue to work with her headache doctors.

## 2011-01-25 NOTE — Assessment & Plan Note (Signed)
Fragmented sleep is characteristic of narcolepsy. She has been educated on good sleep hygiene. Medication options reviewed.

## 2011-03-02 ENCOUNTER — Telehealth: Payer: Self-pay | Admitting: Internal Medicine

## 2011-03-02 MED ORDER — METHYLPHENIDATE HCL 5 MG PO TABS: ORAL_TABLET | ORAL | Status: DC

## 2011-03-02 MED ORDER — METHYLPHENIDATE HCL ER 20 MG PO TBCR: EXTENDED_RELEASE_TABLET | ORAL | Status: DC

## 2011-03-02 MED ORDER — ZOLPIDEM TARTRATE ER 12.5 MG PO TBCR: 12.5000 mg | EXTENDED_RELEASE_TABLET | Freq: Every evening | ORAL | Status: DC | PRN

## 2011-03-02 NOTE — Telephone Encounter (Signed)
Called, spoke with pt. I informed her rxs are at front for pick up and ambien cr 12.5mg  take 1 po qhs prn called into [facility].  She verbalized understanding of this.

## 2011-03-02 NOTE — Telephone Encounter (Signed)
Pt is requesting to pick up rxs for methylphidate 20mg  er, methylphidate 5 mg, and ambein cr 12.5mg  1 qhs prn.    Last OV with CDY 01/22/11 Pending OV with CDY 01/22/12  I printed the rxs for Methylphidate and placed on CDY's cart for signature but ambein is not on pt's current med list.  Dr. Maple Hudson, pls advise if this is ok.  Thanks!

## 2011-03-02 NOTE — Telephone Encounter (Signed)
Rx's printed and signed however CY states okay to call in Ambien Cr 12.5mg  #30 take 1 po qhs prn with 5 refills as he is gone for the afternoon for [employer] training.   RX's printed are at front for pick up.

## 2011-06-12 ENCOUNTER — Telehealth: Payer: Self-pay | Admitting: Internal Medicine

## 2011-06-12 MED ORDER — METHYLPHENIDATE HCL 5 MG PO TABS: ORAL_TABLET | ORAL | Status: DC

## 2011-06-12 MED ORDER — METHYLPHENIDATE HCL ER 20 MG PO TBCR: EXTENDED_RELEASE_TABLET | ORAL | Status: DC

## 2011-06-12 NOTE — Telephone Encounter (Signed)
Pt aware that Rx's at front for pick up-due to weather pt understands to call the office prior to coming to pick up Rx's as we may close early.

## 2011-06-12 NOTE — Telephone Encounter (Signed)
Last ov with CDY 01-22-11, follow up in 1 year > appt scheduled for 01-22-12.  Called spoke with patient, verified medications/dose/instructions that pt is requesting refills for.  rx's printed for CDY to sign.  Will call pt when ready so that she may pick up.

## 2011-07-07 ENCOUNTER — Other Ambulatory Visit (HOSPITAL_COMMUNITY)
Admission: RE | Admit: 2011-07-07 | Discharge: 2011-07-07 | Disposition: A | Discharge status: Home / Self Care | Payer: BC Managed Care – PPO | Source: Ambulatory Visit | Attending: Family Medicine | Admitting: Family Medicine

## 2011-07-07 ENCOUNTER — Other Ambulatory Visit: Payer: Self-pay | Admitting: Physician Assistant

## 2011-07-07 DIAGNOSIS — Z0000 Encounter for general adult medical examination without abnormal findings: Secondary | ICD-10-CM | POA: Insufficient documentation

## 2011-09-09 ENCOUNTER — Telehealth: Payer: Self-pay | Admitting: Internal Medicine

## 2011-09-09 MED ORDER — METHYLPHENIDATE HCL ER 20 MG PO TBCR: EXTENDED_RELEASE_TABLET | ORAL | Status: DC

## 2011-09-09 MED ORDER — METHYLPHENIDATE HCL 5 MG PO TABS: ORAL_TABLET | ORAL | Status: DC

## 2011-09-09 NOTE — Telephone Encounter (Signed)
Rx signed and at from to pick up. Left message that phone note complete.

## 2011-09-09 NOTE — Telephone Encounter (Signed)
rx's have been printed off for cdy to sign. Please advise Dr. Maple Hudson thanks

## 2011-10-28 ENCOUNTER — Telehealth: Payer: Self-pay | Admitting: Internal Medicine

## 2011-10-28 NOTE — Telephone Encounter (Signed)
Spoke with patient-states she is needing her RX asap; I explained that CY is out of office until am and can advise then. Pt understands and states she is due in work at [employer] but will miss due to being out of medications. I explained she needs to reach [place] sooner to make sure her medications are filled ontime so she doesn't miss work due to this. Pt will await decision and recs from CY in am.

## 2011-10-28 NOTE — Telephone Encounter (Signed)
Pt does not have any medication for work tomorrow.  Needs Rx ASAP.  Antionette Fairy

## 2011-10-28 NOTE — Telephone Encounter (Signed)
Ok to refill her methylphenidate 20 mg as before, on paper. She should take it to a couple of different pharmacies, rather than repeatedly going to her own if it isn't getting this drug in.

## 2011-10-28 NOTE — Telephone Encounter (Signed)
lmomtcb  

## 2011-10-29 MED ORDER — METHYLPHENIDATE HCL 5 MG PO TABS: ORAL_TABLET | ORAL | Status: DC

## 2011-10-29 MED ORDER — METHYLPHENIDATE HCL ER 20 MG PO TBCR: EXTENDED_RELEASE_TABLET | ORAL | Status: DC

## 2011-10-29 NOTE — Telephone Encounter (Signed)
Per CY-ok to refill this time.

## 2011-10-29 NOTE — Telephone Encounter (Signed)
Called, spoke with pt.  I informed her of below per Dr. Maple Hudson.  Pt states she has been taking more tablets of the 5 mgs bc the 20 mg tablets are on backorder at [facility].  Therefore, she has ran out of the 5 mg tablets as well.  Reports she has been taking at least 7 of the 5 mg tablets per day.  She is aware we will print off rx for the methylphenidate 20 mg so she can take this to different pharmacies.  Also, she is requesting a rx for 5 mg tablets.  Dr. Maple Hudson, are you ok with this?    Per chart, methylphenidate 5 mg rx was last written on 09/09/11 # 180 x 0.

## 2011-10-29 NOTE — Telephone Encounter (Addendum)
Methylphenidate 20 mg and 5 mg rxs have been printed and placed on CDY's cart for signature.    lmomtcb to see if pt wants to pick these up once signed or have them mailed to her.

## 2011-10-30 NOTE — Telephone Encounter (Signed)
Not clear why this is in my look at box

## 2011-10-30 NOTE — Telephone Encounter (Signed)
Rx at front -pt aware.

## 2011-10-30 NOTE — Telephone Encounter (Signed)
Denise Herrera, have this been taking care of. Please advise, thanks

## 2011-12-02 ENCOUNTER — Telehealth: Payer: Self-pay | Admitting: Internal Medicine

## 2011-12-02 NOTE — Telephone Encounter (Signed)
[facility] [place] [facility]  ambien cr 12.5mg  <> take  1 tablet at bedtime as needed.  #30 X5 Last fill 2- [id number] Allergies  Allergen Reactions  . Armodafinil     REACTION: headaches  . Zolpidem Tartrate     REACTION: forgettfulness   Dr young is out of the office sent to Dr wert for approval. Thank you

## 2011-12-03 MED ORDER — ZOLPIDEM TARTRATE ER 12.5 MG PO TBCR: 12.5000 mg | EXTENDED_RELEASE_TABLET | Freq: Every evening | ORAL | Status: DC | PRN

## 2011-12-03 NOTE — Telephone Encounter (Signed)
Ok x one

## 2011-12-03 NOTE — Telephone Encounter (Signed)
Called refill under CY to [facility] per MW.

## 2011-12-16 ENCOUNTER — Telehealth: Payer: Self-pay | Admitting: Internal Medicine

## 2011-12-16 NOTE — Telephone Encounter (Signed)
I spoke with pt and advised her we had called rx in on 12/03/11 #30 x 0 refills. She states the pharmacy is telling her they never received this. I spoke with [facility] and was advised they did receive rx and was ready for pick up. Pt is aware of this and needed nothing further

## 2011-12-30 ENCOUNTER — Telehealth: Payer: Self-pay | Admitting: Internal Medicine

## 2011-12-30 NOTE — Telephone Encounter (Signed)
Spoke with patient about Rx refill she req.  Last OV 01/22/11 Upcoming OV 01/22/12 Pt req refill Ritalin 5mg  #180  Take 3 tablets bid prn Pt has been out of this medication for the past few days--she takes the 5mg  with her 20mg  to help her stay awake during the day and has noticed that without it she becomes tired more quickly than before.   Please advise Dr Maple Hudson if okay to refill Rx. Thanks.   Allergies  Allergen Reactions  . Armodafinil     REACTION: headaches  . Zolpidem Tartrate     REACTION: forgettfulness

## 2011-12-31 NOTE — Telephone Encounter (Signed)
Ok to refill 

## 2012-01-01 MED ORDER — METHYLPHENIDATE HCL 5 MG PO TABS: ORAL_TABLET | ORAL | Status: DC

## 2012-01-01 NOTE — Telephone Encounter (Signed)
Left message for patient that Rx is ready to picked up and will be at the front for her. Also, patient's mobile number is incorrect. Left message on patient's home phone.

## 2012-01-01 NOTE — Telephone Encounter (Signed)
Rx printed and placed on CY look-at to sign. Pt needs to be advised when rx is ready for pick-up. Carron Curie, CMA

## 2012-01-22 ENCOUNTER — Encounter: Payer: Self-pay | Admitting: *Deleted

## 2012-01-22 ENCOUNTER — Encounter: Payer: Self-pay | Admitting: Internal Medicine

## 2012-01-22 ENCOUNTER — Ambulatory Visit (INDEPENDENT_AMBULATORY_CARE_PROVIDER_SITE_OTHER): Payer: BC Managed Care – PPO | Admitting: Internal Medicine

## 2012-01-22 VITALS — BP 124/90 | HR 91 | Ht 64.0 in | Wt 169.4 lb

## 2012-01-22 DIAGNOSIS — G47419 Narcolepsy without cataplexy: Secondary | ICD-10-CM

## 2012-01-22 NOTE — Progress Notes (Signed)
Subjective:    Patient ID: Denise Herrera, female    DOB: 1971-02-26, 41 y.o.   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 Last here July 22, 2010 Sleep is ok. Still occasional nap mid-day. Sleeps through night. Metadate 20ER - daily, methylphenidate 5mg  - 1 in AM, 2 at lunch. Usually skips Saturdays and Sundays. Menses make HA worse, but her stimulants seem to not affect them. Denies my descriptions of sleep paralysis, cataplexy and hypnagogic hallucination.  Denies chest pain, palpitation, inappropriate anger, Working with headache specialist trying tofranil and pain meds.  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 Patient reports she is doing fine--denies any symptoms She naps whenever she is able to. Continues Metadate 20 mg ER once each morning with methylphenidate 5 mg each morning and at lunch but not on weekends. Occasional supply problem with her drugstore. Topamax has helped her headache.  Review of Systems Constitutional:   No-   weight loss, night sweats, fevers, chills, fatigue, lassitude. HEENT:   +  headaches, no-difficulty swallowing, tooth/dental problems, sore throat,       No-  sneezing, itching, ear ache, nasal congestion, post nasal drip,  CV:  No-   chest pain, orthopnea, PND, swelling in lower extremities, anasarca, dizziness, palpitations Resp: No-   shortness of breath with exertion or at rest.              No-   productive cough,  No non-productive cough,  No-  coughing up of blood.              No-   change in color of mucus.  No- wheezing.   Skin: No-   rash or lesions. GI:  No-   heartburn, indigestion, abdominal pain, nausea, vomiting,  GU: . MS:  No-   joint pain or swelling.  No- decreased range of motion.  No- back pain. Neuro- nothing unusual Psych:  No- change in  mood or affect. No depression or anxiety.  No memory loss    Objective:   Physical Exam General- Alert, Oriented, Affect-appropriate, Distress- none acute  Medium build. Appears well Skin- rash-none, lesions- none, excoriation- none; tatoos Lymphadenopathy- none Head- atraumatic            Eyes- Gross vision intact, PERRLA, conjunctivae clear secretions            Ears- Hearing, canals normal            Nose- Clear, NoSeptal dev, mucus, polyps, erosion, perforation             Throat- Mallampati II , mucosa clear , drainage- none, tonsils- atrophic Neck- flexible , trachea midline, no stridor , thyroid nl, carotid no bruit Chest - symmetrical excursion , unlabored           Heart/CV- RRR , no murmur , no gallop  , no rub, nl s1 s2                           - JVD- none , edema- none, stasis changes- none, varices- none           Lung- clear to P&A, wheeze- none, cough- none , dullness-none, rub- none           Chest wall-  Abd-  Br/ Gen/ Rectal- Not done, not indicated Extrem- cyanosis- none, clubbing,  none, atrophy- none, strength- nl Neuro- grossly intact to observation. No tremor Assessment & Plan:

## 2012-01-22 NOTE — Patient Instructions (Addendum)
We can continue present meds  Please call as needed 

## 2012-01-31 NOTE — Assessment & Plan Note (Signed)
Her pattern has not changed and medications seem to be working well. There has been no problem with medication tolerance. She understands sleep hygiene and the need for naps as well as her responsibility to drive safely.

## 2012-03-18 ENCOUNTER — Telehealth: Payer: Self-pay | Admitting: Internal Medicine

## 2012-03-18 MED ORDER — METHYLPHENIDATE HCL 5 MG PO TABS: ORAL_TABLET | ORAL | Status: DC

## 2012-03-18 MED ORDER — ZOLPIDEM TARTRATE ER 12.5 MG PO TBCR: 12.5000 mg | EXTENDED_RELEASE_TABLET | Freq: Every evening | ORAL | Status: DC | PRN

## 2012-03-18 NOTE — Telephone Encounter (Signed)
Per CY-okay to refill as requested. 

## 2012-03-18 NOTE — Telephone Encounter (Signed)
Pt is requesting refill of the ambien and the ritalin.    Pt last ov--01/22/2012 Pt next ov--01/23/2013  CY please advise on these refills please. Thanks  Allergies  Allergen Reactions  . Armodafinil     REACTION: headaches  . Zolpidem Tartrate     REACTION: forgettfulness

## 2012-03-18 NOTE — Telephone Encounter (Signed)
Ambien has been called in, I have printed Ritalin for CY to sign.

## 2012-03-18 NOTE — Telephone Encounter (Signed)
Pt aware that Ambien has been called in and she will come Monday and pickup her Ritalin prescription.

## 2012-03-22 ENCOUNTER — Telehealth: Payer: Self-pay | Admitting: Internal Medicine

## 2012-03-22 NOTE — Telephone Encounter (Signed)
Pt aware that we will put refills on additional refills on prescription.

## 2012-05-24 ENCOUNTER — Telehealth: Payer: Self-pay | Admitting: Internal Medicine

## 2012-05-24 MED ORDER — ZOLPIDEM TARTRATE ER 12.5 MG PO TBCR: 12.5000 mg | EXTENDED_RELEASE_TABLET | Freq: Every evening | ORAL | Status: DC | PRN

## 2012-05-24 MED ORDER — METHYLPHENIDATE HCL ER 20 MG PO TBCR: EXTENDED_RELEASE_TABLET | ORAL | Status: DC

## 2012-05-24 NOTE — Telephone Encounter (Signed)
Yes ok to refill both  

## 2012-05-24 NOTE — Telephone Encounter (Signed)
Pt was last seen on 01/22/12. Last fill of Ritalin was 10/29/11 Last fill of Ambien was 03/18/12.  Would it be okay to refill these? Thanks!

## 2012-05-24 NOTE — Telephone Encounter (Signed)
LMTCB x 1 for the pt. Will print both prescriptions so the pt can pick this up. RX placed on CY cart for signature.

## 2012-05-25 NOTE — Telephone Encounter (Signed)
Left message that rx's ready for pickup and if any questions or concerns then patient to call the office.

## 2012-06-21 ENCOUNTER — Telehealth: Payer: Self-pay | Admitting: Internal Medicine

## 2012-06-21 NOTE — Telephone Encounter (Signed)
ATC patient no answer, lmomtcb

## 2012-06-22 NOTE — Telephone Encounter (Signed)
Last ov 01/22/12. Pending ov 01/23/13.  LMTCB x 1 for the pt.

## 2012-06-23 MED ORDER — METHYLPHENIDATE HCL 5 MG PO TABS: ORAL_TABLET | ORAL | Status: DC

## 2012-06-23 NOTE — Telephone Encounter (Signed)
Per CY-okay to refill as requested. Rx printed and placed on CY's cart to sign. Will call patient once ready for pick up.

## 2012-06-23 NOTE — Telephone Encounter (Signed)
LMTCB-Rx at front for pick up.

## 2012-06-23 NOTE — Telephone Encounter (Signed)
Pls advise if okay for Ritalin 5 mg refill. Pt would like to pick this up.

## 2012-06-24 NOTE — Telephone Encounter (Signed)
LMTCB x 1 for the pt  

## 2012-06-29 NOTE — Telephone Encounter (Signed)
Pt has picked up RX

## 2012-07-04 ENCOUNTER — Telehealth: Payer: Self-pay | Admitting: Internal Medicine

## 2012-07-04 NOTE — Telephone Encounter (Signed)
lmtcb X1 

## 2012-07-05 MED ORDER — METHYLPHENIDATE HCL 10 MG PO TABS: 10.0000 mg | ORAL_TABLET | Freq: Three times a day (TID) | ORAL | Status: DC

## 2012-07-05 NOTE — Telephone Encounter (Signed)
lmomtcb  

## 2012-07-05 NOTE — Telephone Encounter (Signed)
Spoke with pt and notified of recs per CDY She verbalized understanding Rx printed and we will call her once ready to pick up Thanks

## 2012-07-05 NOTE — Telephone Encounter (Signed)
Per CY-change to Ritalin 10 mg #90 take 3 tablets daily no refills.

## 2012-07-05 NOTE — Telephone Encounter (Signed)
Pt returned triage's call.  Holly D Pryor ° °

## 2012-07-05 NOTE — Telephone Encounter (Signed)
Spoke with pt She states that ritalin 5 mg 3 bid prn no longer covered She states pharmacist at [facility] told her that her insurance only allows 3 tablets daily Do you want to do a PA, or change med or strength? Please advise thanks! Last ov 02/01/12 Next ov 01/23/13 Allergies  Allergen Reactions  . Armodafinil     REACTION: headaches  . Zolpidem Tartrate     REACTION: forgettfulness

## 2012-07-05 NOTE — Telephone Encounter (Signed)
rx signed by CY Left detailed message on named voice mail informing pt that her "requested rx is ready to be picked up at her convenience." rx placed in brown folder up front Will sign off.

## 2012-08-16 ENCOUNTER — Telehealth: Payer: Self-pay | Admitting: Internal Medicine

## 2012-08-16 MED ORDER — METHYLPHENIDATE HCL ER 20 MG PO TBCR: EXTENDED_RELEASE_TABLET | ORAL | Status: DC

## 2012-08-16 NOTE — Telephone Encounter (Signed)
lmomtcb x1 

## 2012-08-16 NOTE — Telephone Encounter (Signed)
Pt returned call Verified refill medication, strength, directions and quantity Last ov w/ CY 9.6.13, follow up in 1 year Last refill 1.7.14 #60 Rx printed for CY to sign Pt to pick up Please call when ready

## 2012-08-16 NOTE — Telephone Encounter (Signed)
Rx signed and at front for pick up. Pt aware and nothing more needed at this time.

## 2012-08-16 NOTE — Telephone Encounter (Signed)
ok 

## 2012-09-14 ENCOUNTER — Telehealth: Payer: Self-pay | Admitting: Internal Medicine

## 2012-09-14 MED ORDER — METHYLPHENIDATE HCL 10 MG PO TABS: 10.0000 mg | ORAL_TABLET | Freq: Three times a day (TID) | ORAL | Status: DC

## 2012-09-14 NOTE — Telephone Encounter (Signed)
Spoke with patient-Rx printed and placed on CY's cart-will place at front. Pt aware to pick up Thursday at lunch time. Nothing more needed at this time. Will sign off on message.

## 2012-11-07 ENCOUNTER — Telehealth: Payer: Self-pay | Admitting: Internal Medicine

## 2012-11-07 MED ORDER — METHYLPHENIDATE HCL ER 20 MG PO TBCR: EXTENDED_RELEASE_TABLET | ORAL | Status: DC

## 2012-11-07 NOTE — Telephone Encounter (Signed)
Pt requesting rx for mmethylphenidate 20mg  er. rx sent for CY to sign.  ,\ Allergies  Allergen Reactions  . Armodafinil     REACTION: headaches  . Zolpidem Tartrate     REACTION: forgettfulness   Dr Maple Hudson please advise Thank you

## 2012-11-07 NOTE — Telephone Encounter (Signed)
Pt aware that Rx is at front for pick up. Will sign off on message.

## 2012-11-25 ENCOUNTER — Telehealth: Payer: Self-pay | Admitting: Internal Medicine

## 2012-11-25 MED ORDER — METHYLPHENIDATE HCL 10 MG PO TABS: 10.0000 mg | ORAL_TABLET | Freq: Three times a day (TID) | ORAL | Status: DC

## 2012-11-25 NOTE — Telephone Encounter (Signed)
Rx signed by CY Rx placed up front for pt to pick up at her convenience  LMOM TCB x1

## 2012-11-25 NOTE — Telephone Encounter (Signed)
Called and spoke with pt and she is aware that rx has been printed and we will call her once this rx is ready to be picked up.

## 2012-11-28 NOTE — Telephone Encounter (Signed)
Pt advised Rx available. Carron Curie, CMA

## 2012-12-18 ENCOUNTER — Other Ambulatory Visit: Payer: Self-pay | Admitting: Internal Medicine

## 2012-12-19 ENCOUNTER — Telehealth: Payer: Self-pay | Admitting: Internal Medicine

## 2012-12-19 MED ORDER — ZOLPIDEM TARTRATE ER 12.5 MG PO TBCR: 12.5000 mg | EXTENDED_RELEASE_TABLET | Freq: Every evening | ORAL | Status: DC | PRN

## 2012-12-19 NOTE — Telephone Encounter (Signed)
Please advise if okay to refill. Thanks.  

## 2012-12-19 NOTE — Telephone Encounter (Signed)
Ok to refill 

## 2012-12-19 NOTE — Telephone Encounter (Signed)
ambien 12.5 mg last refilled 05/24/12 #30 x 5 refills Last OV 01/22/12 Pending 01/23/13 Please advise if okay to refill Dr. Maple Hudson thanks

## 2012-12-19 NOTE — Telephone Encounter (Signed)
Per CDY: ok to refill  ----  Rx called into [facility].  Pt aware and was asked to keep OV with CDY in Sept 2014 for further rxs.

## 2013-01-13 ENCOUNTER — Telehealth: Payer: Self-pay | Admitting: Internal Medicine

## 2013-01-13 MED ORDER — METHYLPHENIDATE HCL ER 20 MG PO TBCR: EXTENDED_RELEASE_TABLET | ORAL | Status: DC

## 2013-01-13 MED ORDER — METHYLPHENIDATE HCL 10 MG PO TABS: 10.0000 mg | ORAL_TABLET | Freq: Three times a day (TID) | ORAL | Status: DC

## 2013-01-13 NOTE — Telephone Encounter (Signed)
Pt is needing ritalin 10 mg and 20 mg. She will pick up. This has been printed out and placed on CDY cart for signature. Please advise thanks

## 2013-01-13 NOTE — Telephone Encounter (Signed)
Done

## 2013-01-13 NOTE — Telephone Encounter (Signed)
Rx has been placed up front and patient is aware she may pick this up at her convenience Pt stated that she is out of her medication and asked if her pharmacy could dispense her #1 pill because she will need this for work on Tuesday.  Advised pt she may ask her pharmacist about this, but that it would be an out of pocket expense.  Pt verbalized her understanding. Nothing further needed at this time; will sign off.

## 2013-01-23 ENCOUNTER — Ambulatory Visit (INDEPENDENT_AMBULATORY_CARE_PROVIDER_SITE_OTHER): Payer: BC Managed Care – PPO | Admitting: Internal Medicine

## 2013-01-23 ENCOUNTER — Encounter: Payer: Self-pay | Admitting: Internal Medicine

## 2013-01-23 VITALS — BP 138/90 | HR 109 | Ht 64.0 in | Wt 165.0 lb

## 2013-01-23 DIAGNOSIS — G4700 Insomnia, unspecified: Secondary | ICD-10-CM

## 2013-01-23 DIAGNOSIS — G47419 Narcolepsy without cataplexy: Secondary | ICD-10-CM

## 2013-01-23 MED ORDER — ZOLPIDEM TARTRATE ER 12.5 MG PO TBCR: 12.5000 mg | EXTENDED_RELEASE_TABLET | Freq: Every evening | ORAL | Status: DC | PRN

## 2013-01-23 NOTE — Progress Notes (Signed)
Subjective:    Patient ID: Denise Herrera, female    DOB: 06-13-1970, 42 y.o.   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 Last here July 22, 2010 Sleep is ok. Still occasional nap mid-day. Sleeps through night. Metadate 20ER - daily, methylphenidate 5mg  - 1 in AM, 2 at lunch. Usually skips Saturdays and Sundays. Menses make HA worse, but her stimulants seem to not affect them. Denies my descriptions of sleep paralysis, cataplexy and hypnagogic hallucination.  Denies chest pain, palpitation, inappropriate anger, Working with headache specialist trying tofranil and pain meds.  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 Patient reports she is doing fine--denies any symptoms She naps whenever she is able to. Continues Metadate 20 mg ER once each morning with methylphenidate 5 mg each morning and at lunch but not on weekends. Occasional supply problem with her drugstore. Topamax has helped her headache.  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 FOLLOWS FOR:has been able to take meds and stay awake. Would like to discuss Flu shot today. She thinks she is doing well with these medications:Ritalin ER 20 mg in the morning, 5 mg in the morning and at noon. Skips weekends. Occasionally uses Ambien CR. Does not have cataplexy  Review of Systems- aee HPI Constitutional:   No-   weight loss, night sweats, fevers, chills, +fatigue, lassitude. HEENT:   +  headaches, no-difficulty swallowing, tooth/dental problems, sore throat,       No-  sneezing, itching, ear ache, nasal congestion, post nasal drip,  CV:  No-   chest pain, orthopnea, PND, swelling in lower extremities, anasarca, dizziness, palpitations Resp: No-   shortness  of breath with exertion or at rest.              No-   productive cough,  No non-productive cough,  No-  coughing up of blood.              No-   change in color of mucus.  No- wheezing.   Skin: No-   rash or lesions.  GI:  No-   heartburn, indigestion, abdominal pain, nausea, vomiting,  GU: . MS:  No-   joint pain or swelling.   Neuro- nothing unusual Psych:  No- change in mood or affect. No depression or anxiety.  No memory loss    Objective:   Physical Exam General- Alert, Oriented, Affect-appropriate, Distress- none acute  Medium build. Appears well Skin- rash-none, lesions- none, excoriation- none; tatoos Lymphadenopathy- none Head- atraumatic            Eyes- Gross vision intact, PERRLA, conjunctivae clear secretions            Ears- Hearing, canals normal            Nose- Clear, NoSeptal dev, mucus, polyps, erosion, perforation             Throat- Mallampati II , mucosa clear , drainage- none, tonsils- atrophic Neck- flexible , trachea midline, no stridor , thyroid nl, carotid no bruit Chest - symmetrical excursion , unlabored           Heart/CV- RRR , no murmur , no gallop  , no rub, nl s1 s2                           -  JVD- none , edema- none, stasis changes- none, varices- none           Lung- clear to P&A, wheeze- none, cough- none , dullness-none, rub- none           Chest wall-  Abd-  Br/ Gen/ Rectal- Not done, not indicated Extrem- cyanosis- none, clubbing, none, atrophy- none, strength- nl Neuro- grossly intact to observation. No tremor Assessment & Plan:

## 2013-01-23 NOTE — Patient Instructions (Addendum)
We can continue the methylphenidate/ ritalin as before  Ambien CR refilled  If you change your mind about the flu vaccine, you can get it anywhere, including drug stores, this fall

## 2013-01-29 NOTE — Assessment & Plan Note (Signed)
She has maintained good control, without developing tolerance to her meds. Appropriate naps.

## 2013-01-29 NOTE — Assessment & Plan Note (Signed)
Occasional Ambien CR has been sufficient

## 2013-03-06 ENCOUNTER — Telehealth: Payer: Self-pay | Admitting: Internal Medicine

## 2013-03-06 MED ORDER — METHYLPHENIDATE HCL 10 MG PO TABS: 10.0000 mg | ORAL_TABLET | Freq: Three times a day (TID) | ORAL | Status: DC

## 2013-03-06 NOTE — Telephone Encounter (Signed)
Ok to refill routinely

## 2013-03-06 NOTE — Telephone Encounter (Signed)
Last OV 01/23/13 Pending OV 01/23/14 Last fill 01/13/13 #90  Allergies  Allergen Reactions  . Armodafinil     REACTION: headaches  . Zolpidem Tartrate     REACTION: forgettfulness    CY - please advise on refill. Thanks.

## 2013-03-06 NOTE — Telephone Encounter (Signed)
Pt made aware that Rx at front for pick up.  

## 2013-03-06 NOTE — Telephone Encounter (Signed)
RX printed and placed on CDY CART. WILL FORWARD TO KATIE ONCE SIGNED AND LEFT UPFRONT

## 2013-03-23 ENCOUNTER — Other Ambulatory Visit: Payer: Self-pay

## 2013-04-26 ENCOUNTER — Telehealth: Payer: Self-pay | Admitting: Internal Medicine

## 2013-04-26 MED ORDER — METHYLPHENIDATE HCL ER 20 MG PO TBCR: EXTENDED_RELEASE_TABLET | ORAL | Status: DC

## 2013-04-26 NOTE — Telephone Encounter (Signed)
Pt aware that Rx will be ready for pick up tomorrow as CY is off this afternoon. Rx is for 20 mg tablets. Nothing more needed; will sign off on message.

## 2013-06-08 ENCOUNTER — Telehealth: Payer: Self-pay | Admitting: Internal Medicine

## 2013-06-08 MED ORDER — METHYLPHENIDATE HCL 10 MG PO TABS
10.0000 mg | ORAL_TABLET | Freq: Three times a day (TID) | ORAL | Status: DC
Start: 2013-06-08 — End: 2013-08-08

## 2013-06-08 MED ORDER — METHYLPHENIDATE HCL 10 MG PO TABS: 10.0000 mg | ORAL_TABLET | Freq: Three times a day (TID) | ORAL | Status: DC

## 2013-06-08 NOTE — Telephone Encounter (Signed)
Rx on CDY's cart to be signed

## 2013-06-08 NOTE — Telephone Encounter (Signed)
Pt requesting rx for Methylphenidate 10 mg.  Last filled on 03/06/13 for # 90 tablets.  Pt would like to pick up rx.  Please advise if ok to print rx.

## 2013-06-08 NOTE — Telephone Encounter (Signed)
Ok to refill 

## 2013-06-08 NOTE — Telephone Encounter (Signed)
Spoke with pt and is aware RX will be ready for pick this afternoon. Nothing further needed

## 2013-08-01 ENCOUNTER — Telehealth: Payer: Self-pay | Admitting: Internal Medicine

## 2013-08-01 MED ORDER — METHYLPHENIDATE HCL ER 20 MG PO TBCR: EXTENDED_RELEASE_TABLET | ORAL | Status: DC

## 2013-08-01 NOTE — Telephone Encounter (Signed)
Spoke with pt. She needed a refill on metadate 20 mg. She will pick this up later. RX printed and placed on CDY cart for signature. Nothing further needed

## 2013-08-07 ENCOUNTER — Telehealth: Payer: Self-pay | Admitting: Internal Medicine

## 2013-08-08 MED ORDER — METHYLPHENIDATE HCL 10 MG PO TABS: 10.0000 mg | ORAL_TABLET | Freq: Three times a day (TID) | ORAL | Status: DC

## 2013-08-08 NOTE — Telephone Encounter (Signed)
Rx signed and mailed to patient as requested.

## 2013-09-07 ENCOUNTER — Telehealth: Payer: Self-pay | Admitting: Internal Medicine

## 2013-09-07 MED ORDER — ZOLPIDEM TARTRATE ER 12.5 MG PO TBCR: 12.5000 mg | EXTENDED_RELEASE_TABLET | Freq: Every evening | ORAL | Status: DC | PRN

## 2013-09-07 NOTE — Telephone Encounter (Signed)
Called and lmom to make the pt aware that the rx for the Remus Lofflerambien has been called to the pharmacy.  Pt to call back for anything further is needed

## 2013-09-07 NOTE — Telephone Encounter (Signed)
Per CY-okay to refill as requested. 

## 2013-09-07 NOTE — Telephone Encounter (Signed)
Pt is calling for a refill of her ambien.  Last filled on 01/23/2013 given with 5 refills.    Last ov--01/23/2013 Next ov--01/23/2013  Please advise if ok to refill CY.  thanks

## 2013-10-30 ENCOUNTER — Telehealth: Payer: Self-pay | Admitting: Internal Medicine

## 2013-10-30 MED ORDER — METHYLPHENIDATE HCL ER 20 MG PO TBCR: EXTENDED_RELEASE_TABLET | ORAL | Status: DC

## 2013-10-30 MED ORDER — METHYLPHENIDATE HCL 10 MG PO TABS: 10.0000 mg | ORAL_TABLET | Freq: Three times a day (TID) | ORAL | Status: DC

## 2013-10-30 NOTE — Telephone Encounter (Signed)
Ok to refill 

## 2013-10-30 NOTE — Telephone Encounter (Signed)
Pt last had refill on ritalin 10 08/08/13 #90 x0 refills Ritalin 20 mg 08/01/13 #60 x0 refills Please advise Dr. Maple HudsonYoung thanks

## 2013-10-30 NOTE — Telephone Encounter (Signed)
Per CY-okay to refill. Rx's printed and placed on CY's cart.    Rx's are signed by CY and placed at front for pick up. I attempted to call patient at all numbers and no answer or a way to leave a message. Will leave in Triage to call patient in AM.

## 2013-10-31 NOTE — Telephone Encounter (Signed)
Pt aware. Nothing further needed 

## 2014-01-11 ENCOUNTER — Telehealth: Payer: Self-pay | Admitting: Internal Medicine

## 2014-01-11 MED ORDER — METHYLPHENIDATE HCL 10 MG PO TABS: 10.0000 mg | ORAL_TABLET | Freq: Three times a day (TID) | ORAL | Status: DC

## 2014-01-11 MED ORDER — METHYLPHENIDATE HCL ER 20 MG PO TBCR: EXTENDED_RELEASE_TABLET | ORAL | Status: DC

## 2014-01-11 NOTE — Telephone Encounter (Signed)
Called and spoke with pt and she stated if CY could refill both the ritalin 20 mg er tablets and the 10 mg tablets.  IF CY will refills these pt would like these mailed to her.  CY please advise. Thanks  Last ov--01/23/13 Next ov--01/23/14  Allergies  Allergen Reactions  . Armodafinil     REACTION: headaches  . Zolpidem Tartrate     REACTION: forgettfulness    Current Outpatient Prescriptions on File Prior to Visit  Medication Sig Dispense Refill  . carvedilol (COREG) 6.25 MG tablet Take 6.25 mg by mouth 2 (two) times daily with a meal. Patient does 6.25 in AM and 12.5 in PM      . cetirizine (ZYRTEC) 10 MG tablet Take 10 mg by mouth daily.        . cyclobenzaprine (FLEXERIL) 10 MG tablet 1 at bedtime      . methylphenidate (METADATE ER) 20 MG ER tablet Twice daily as needed  60 tablet  0  . methylphenidate (RITALIN) 10 MG tablet Take 1 tablet (10 mg total) by mouth 3 (three) times daily.  90 tablet  0  . zolpidem (AMBIEN CR) 12.5 MG CR tablet Take 1 tablet (12.5 mg total) by mouth at bedtime as needed for sleep.  30 tablet  5   No current facility-administered medications on file prior to visit.

## 2014-01-11 NOTE — Telephone Encounter (Signed)
Pt is aware of rx being filled and mailed to her. Nothing further is needed.

## 2014-01-11 NOTE — Telephone Encounter (Signed)
Ok to refill both and mail- confirm address

## 2014-01-23 ENCOUNTER — Encounter: Payer: Self-pay | Admitting: Internal Medicine

## 2014-01-23 ENCOUNTER — Encounter: Payer: Self-pay | Admitting: *Deleted

## 2014-01-23 ENCOUNTER — Ambulatory Visit (INDEPENDENT_AMBULATORY_CARE_PROVIDER_SITE_OTHER): Payer: BC Managed Care – PPO | Admitting: Internal Medicine

## 2014-01-23 VITALS — BP 126/82 | HR 77 | Ht 64.0 in | Wt 161.4 lb

## 2014-01-23 DIAGNOSIS — G4700 Insomnia, unspecified: Secondary | ICD-10-CM

## 2014-01-23 NOTE — Patient Instructions (Signed)
We can continue present meds- please call as needed 

## 2014-01-23 NOTE — Progress Notes (Signed)
Subjective:    Patient ID: Denise Herrera, female    DOB: 1970-10-18, 43 y.o.   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 Last here July 22, 2010 Sleep is ok. Still occasional nap mid-day. Sleeps through night. Metadate 20ER - daily, methylphenidate  - 1 in AM, 2 at lunch. Usually skips Saturdays and Sundays. Menses make HA worse, but her stimulants seem to not affect them. Denies my descriptions of sleep paralysis, cataplexy and hypnagogic hallucination.  Denies chest pain, palpitation, inappropriate anger, Working with headache specialist trying tofranil and pain meds.  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 Patient reports she is doing fine--denies any symptoms She naps whenever she is able to. Continues Metadate 20 mg ER once each morning with methylphenidate 5 mg each morning and at lunch but not on weekends. Occasional supply problem with her drugstore. Topamax has helped her headache.  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 FOLLOWS FOR:has been able to take meds and stay awake. Would like to discuss Flu shot today. She thinks she is doing well with these medications:Ritalin ER 20 mg in the morning, 5 mg in the morning and at noon. Skips weekends. Occasionally uses Ambien CR. 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 FOLLOWS FOR: meds continue to help stay awake during the day Metadate 20 ER Ritalin 10 Ambien CR 12.5   Review of Systems- aee HPI Constitutional:   No-   weight loss, night sweats, fevers, chills, +fatigue, lassitude. HEENT:   +  headaches, no-difficulty  swallowing, tooth/dental problems, sore throat,       No-  sneezing, itching, ear ache, nasal congestion, post nasal drip,  CV:  No-   chest pain, orthopnea, PND, swelling in lower extremities, anasarca, dizziness, palpitations Resp: No-   shortness of breath with exertion or at rest.              No-   productive cough,  No non-productive cough,  No-  coughing up of blood.              No-   change in color of mucus.  No- wheezing.   Skin: No-   rash or lesions.  GI:  No-   heartburn, indigestion, abdominal pain, nausea, vomiting,  GU: . MS:  No-   joint pain or swelling.   Neuro- nothing unusual Psych:  No- change in mood or affect. No depression or anxiety.  No memory loss    Objective:   Physical Exam General- Alert, Oriented, Affect-appropriate, Distress- none acute  Medium build. Appears well Skin- rash-none, lesions- none, excoriation- none; tatoos Lymphadenopathy- none Head- atraumatic            Eyes- Gross vision intact, PERRLA, conjunctivae clear secretions            Ears- Hearing, canals normal            Nose- Clear, NoSeptal dev, mucus, polyps, erosion, perforation             Throat- Mallampati II , mucosa clear , drainage- none, tonsils- atrophic Neck- flexible , trachea midline, no stridor , thyroid nl, carotid no bruit Chest - symmetrical excursion , unlabored           Heart/CV-  RRR , no murmur , no gallop  , no rub, nl s1 s2                           - JVD- none , edema- none, stasis changes- none, varices- none           Lung- clear to P&A, wheeze- none, cough- none , dullness-none, rub- none           Chest wall-  Abd-  Br/ Gen/ Rectal- Not done, not indicated Extrem- cyanosis- none, clubbing, none, atrophy- none, strength- nl Neuro- grossly intact to observation. No tremor Assessment & Plan:

## 2014-03-23 ENCOUNTER — Telehealth: Payer: Self-pay | Admitting: Internal Medicine

## 2014-03-23 MED ORDER — METHYLPHENIDATE HCL 10 MG PO TABS: 10.0000 mg | ORAL_TABLET | Freq: Three times a day (TID) | ORAL | Status: DC

## 2014-03-23 MED ORDER — METHYLPHENIDATE HCL ER 20 MG PO TBCR: EXTENDED_RELEASE_TABLET | ORAL | Status: DC

## 2014-03-23 NOTE — Telephone Encounter (Signed)
lmtcb for pt.  Rx placed up front for pick up.

## 2014-03-23 NOTE — Telephone Encounter (Signed)
Ok to refill both ritalin doses

## 2014-03-23 NOTE — Telephone Encounter (Signed)
Pt requesting refill for Ritalin 10mg  and 20mg .  Ritalin 10mg -take 1 tab TID-last filled on 01/11/14 for #90, 0 refills.  Ritalin 20mg -1 tab BID prn-last filled on 01/11/14 for #60, 0 refills.  Pt has pending appt for 01/24/2015.   CY please advise.   Allergies  Allergen Reactions  . Armodafinil     REACTION: headaches  . Zolpidem Tartrate     REACTION: forgettfulness    Current Outpatient Prescriptions on File Prior to Visit  Medication Sig Dispense Refill  . carvedilol (COREG) 6.25 MG tablet Take 6.25 mg by mouth 2 (two) times daily with a meal. Patient does 6.25 in AM and 12.5 in PM    . cetirizine (ZYRTEC) 10 MG tablet Take 10 mg by mouth daily.      . cyanocobalamin 500 MCG tablet Take 500 mcg by mouth daily.    . Ginkgo Biloba 40 MG TABS Take 120 mg by mouth daily.    Marland Kitchen. HYDROcodone-acetaminophen (NORCO/VICODIN) 5-325 MG per tablet Take 1 tablet by mouth every 6 (six) hours as needed for moderate pain.    . methylphenidate (METADATE ER) 20 MG ER tablet Twice daily as needed 60 tablet 0  . methylphenidate (RITALIN) 10 MG tablet Take 1 tablet (10 mg total) by mouth 3 (three) times daily. 90 tablet 0  . Omega-3 Fatty Acids (FISH OIL) 1000 MG CAPS Take 1 capsule by mouth daily.    Marland Kitchen. zolpidem (AMBIEN CR) 12.5 MG CR tablet Take 1 tablet (12.5 mg total) by mouth at bedtime as needed for sleep. 30 tablet 5   No current facility-administered medications on file prior to visit.

## 2014-03-26 NOTE — Telephone Encounter (Signed)
Spoke with the pt and notified that rxs are ready  Nothing further needed

## 2014-03-27 ENCOUNTER — Telehealth: Payer: Self-pay | Admitting: Internal Medicine

## 2014-03-27 MED ORDER — METHYLPHENIDATE HCL ER 20 MG PO TBCR: EXTENDED_RELEASE_TABLET | ORAL | Status: DC

## 2014-03-27 MED ORDER — METHYLPHENIDATE HCL 10 MG PO TABS: 10.0000 mg | ORAL_TABLET | Freq: Three times a day (TID) | ORAL | Status: DC

## 2014-03-27 NOTE — Telephone Encounter (Signed)
Pt just came by and I handed her the envelope containing her rxs  Nothing further needed

## 2014-03-27 NOTE — Telephone Encounter (Signed)
Per 03/23/14 phone msg, ritalin 10 and 20 mg rxs were placed at front for pick up.   Pt in office now to pick up rxs.  However, they are not up at the front for pick.  This was verified x 3 nurses that envelope was not overlooked. I spoke with Dr. Maple HudsonYoung and discussed situation.  He is ok with printing rxs again, this time, for pt. Ritalin 10 and 20 mg rxs printed; signed by Dr. Maple HudsonYoung.   When attempting to give rxs to pt, pt has now left the lobby. Per Receptionist, pt went outside for a little while. I have placed the rxs up front for pick up with a note for nurse/front to document in phone msg once rxs are picked up for documentation purposes. Will leave msg open until it is documented that rxs were picked up.

## 2014-05-30 ENCOUNTER — Telehealth: Payer: Self-pay | Admitting: Internal Medicine

## 2014-05-30 ENCOUNTER — Other Ambulatory Visit: Payer: Self-pay | Admitting: Internal Medicine

## 2014-05-30 NOTE — Telephone Encounter (Signed)
Pt aware that I will need to get approval for refill and he is off until AM.   CY please advise. Thanks.

## 2014-05-31 NOTE — Telephone Encounter (Signed)
Pt made aware that Rx has been called with refills [facility] [place] [place] voicemail. Nothing more needed at this time.

## 2014-05-31 NOTE — Telephone Encounter (Signed)
Called to pharmacy voicemail. 

## 2014-05-31 NOTE — Telephone Encounter (Signed)
Ok to refill ambien

## 2014-06-26 ENCOUNTER — Telehealth: Payer: Self-pay | Admitting: Internal Medicine

## 2014-06-26 MED ORDER — METHYLPHENIDATE HCL ER 20 MG PO TBCR: EXTENDED_RELEASE_TABLET | ORAL | Status: DC

## 2014-06-26 MED ORDER — METHYLPHENIDATE HCL 10 MG PO TABS: 10.0000 mg | ORAL_TABLET | Freq: Three times a day (TID) | ORAL | Status: DC

## 2014-06-26 NOTE — Telephone Encounter (Signed)
lmtcb for pt.  

## 2014-06-26 NOTE — Telephone Encounter (Signed)
Pt would like to pick up Rx's and aware to pick up tomorrow. Rx's signed by CY and placed up front. Nothing more needed at this time.

## 2014-06-26 NOTE — Telephone Encounter (Signed)
Return call.Stanley A Dalton °

## 2014-08-29 ENCOUNTER — Telehealth: Payer: Self-pay | Admitting: Internal Medicine

## 2014-08-29 MED ORDER — METHYLPHENIDATE HCL ER 20 MG PO TBCR: EXTENDED_RELEASE_TABLET | ORAL | Status: DC

## 2014-08-29 MED ORDER — METHYLPHENIDATE HCL 10 MG PO TABS: 10.0000 mg | ORAL_TABLET | Freq: Three times a day (TID) | ORAL | Status: DC

## 2014-08-29 NOTE — Telephone Encounter (Signed)
Pt requesting refill on Ritalin 10 and 20mg  20mg  #60 bid prn last refilled 06/26/14, no refills 10mg  #90 tid last refilled 06/26/14, no refills.  Pt is requesting this be mailed to her, verified home address on file.  Dr. Maple HudsonYoung are you ok with this refill?  Thanks!

## 2014-08-29 NOTE — Telephone Encounter (Signed)
rx printed and left on CY's cart to sign with addressed envelope.

## 2014-08-29 NOTE — Telephone Encounter (Signed)
Ok with refills 

## 2014-08-30 NOTE — Telephone Encounter (Signed)
Rx signed and placed up front to be mailed out to the patient home address. Pt aware. Nothing further needed.

## 2014-09-03 ENCOUNTER — Telehealth: Payer: Self-pay | Admitting: Internal Medicine

## 2014-09-03 NOTE — Telephone Encounter (Signed)
Patient has not received her Ritalin prescription in the mail yet. Patient says that had she known that the prescription would not go out until this morning, then she would have just picked it up.  She said that she cannot go to work without her Ritalin and she is completely out of medication.  She wants to know if she can get a couple of pills to last her until she gets her prescription.    CY - please advise. Current Outpatient Prescriptions on File Prior to Visit  Medication Sig Dispense Refill  . carvedilol (COREG) 6.25 MG tablet Take 6.25 mg by mouth 2 (two) times daily with a meal. Patient does 6.25 in AM and 12.5 in PM    . cetirizine (ZYRTEC) 10 MG tablet Take 10 mg by mouth daily.      . cyanocobalamin 500 MCG tablet Take 500 mcg by mouth daily.    . Ginkgo Biloba 40 MG TABS Take 120 mg by mouth daily.    Marland Kitchen. HYDROcodone-acetaminophen (NORCO/VICODIN) 5-325 MG per tablet Take 1 tablet by mouth every 6 (six) hours as needed for moderate pain.    . methylphenidate (METADATE ER) 20 MG ER tablet Twice daily as needed 60 tablet 0  . methylphenidate (RITALIN) 10 MG tablet Take 1 tablet (10 mg total) by mouth 3 (three) times daily. 90 tablet 0  . Omega-3 Fatty Acids (FISH OIL) 1000 MG CAPS Take 1 capsule by mouth daily.    Marland Kitchen. zolpidem (AMBIEN CR) 12.5 MG CR tablet TAKE ONE CAPSULE BY MOUTH AT BEDTIME AS NEEDED SLEEP 30 tablet 5   No current facility-administered medications on file prior to visit.   Allergies  Allergen Reactions  . Armodafinil     REACTION: headaches  . Zolpidem Tartrate     REACTION: forgettfulness

## 2014-09-03 NOTE — Telephone Encounter (Signed)
Ok script for her to pick up providing enough for 3 days. She can come for it tomorrow AM. Her mailed script will probably get to her tomorrow

## 2014-09-04 MED ORDER — METHYLPHENIDATE HCL 10 MG PO TABS: 10.0000 mg | ORAL_TABLET | Freq: Three times a day (TID) | ORAL | Status: DC

## 2014-09-04 NOTE — Telephone Encounter (Signed)
Pt aware of rx being up front for 3 days worth.  rx printed, signed, and placed up front.  Nothing further needed.

## 2014-11-02 ENCOUNTER — Telehealth: Payer: Self-pay | Admitting: Internal Medicine

## 2014-11-02 NOTE — Telephone Encounter (Signed)
Allergies  Allergen Reactions  . Armodafinil     REACTION: headaches  . Zolpidem Tartrate     REACTION: forgettfulness       Pt calling requesting refill on Ritalin 10mg  and 20mg . Last OV was 01/23/14. Last refills were 08/29/14. Pt requesting to pick these up. CY please advise on refills.

## 2014-11-02 NOTE — Telephone Encounter (Signed)
Ok for these refills, to pick up Monday

## 2014-11-05 MED ORDER — METHYLPHENIDATE HCL 10 MG PO TABS: 10.0000 mg | ORAL_TABLET | Freq: Three times a day (TID) | ORAL | Status: DC

## 2014-11-05 MED ORDER — METHYLPHENIDATE HCL ER 20 MG PO TBCR: EXTENDED_RELEASE_TABLET | ORAL | Status: DC

## 2014-11-05 NOTE — Telephone Encounter (Signed)
RX at front for pick up-left message for patient stating this.

## 2014-11-05 NOTE — Telephone Encounter (Signed)
RX'S printed and placed on CDY cart for signature. Will forward to [place] to f/u on once signed.

## 2014-12-31 ENCOUNTER — Telehealth: Payer: Self-pay | Admitting: Internal Medicine

## 2014-12-31 MED ORDER — METHYLPHENIDATE HCL 10 MG PO TABS: 10.0000 mg | ORAL_TABLET | Freq: Three times a day (TID) | ORAL | Status: DC

## 2014-12-31 MED ORDER — METHYLPHENIDATE HCL ER 20 MG PO TBCR: EXTENDED_RELEASE_TABLET | ORAL | Status: DC

## 2014-12-31 NOTE — Telephone Encounter (Signed)
Ok to refill both?? 

## 2014-12-31 NOTE — Telephone Encounter (Signed)
RX's printed and placed on CDY cart for signature. Will forward to Katie to f/u on

## 2014-12-31 NOTE — Telephone Encounter (Signed)
Called spoke with pt. She is requetsing refill on  methylphenidate (METADATE ER) 20 MG ER tablet  Twice daily as needed Last refilled 11/05/14 #60 x 0 refills  And also on methylphenidate (RITALIN) 10 MG tablet Take 1 tablet (10 mg total) by mouth 3 (three) times daily.       Last refilled 11/05/14 #90 x 0 refills  She wants to pick up. Please advise Dr. Maple Hudson thanks

## 2014-12-31 NOTE — Telephone Encounter (Signed)
Pt aware that Rx's have been signed by CY, placed at front for pick up on Tuesday 01-01-15. Nothing more needed at this time.

## 2015-01-24 ENCOUNTER — Encounter: Payer: Self-pay | Admitting: Internal Medicine

## 2015-01-24 ENCOUNTER — Ambulatory Visit (INDEPENDENT_AMBULATORY_CARE_PROVIDER_SITE_OTHER): Payer: BLUE CROSS/BLUE SHIELD | Admitting: Internal Medicine

## 2015-01-24 ENCOUNTER — Encounter: Payer: Self-pay | Admitting: *Deleted

## 2015-01-24 VITALS — BP 114/78 | HR 75 | Ht 64.0 in | Wt 160.4 lb

## 2015-01-24 DIAGNOSIS — G47419 Narcolepsy without cataplexy: Secondary | ICD-10-CM | POA: Diagnosis not present

## 2015-01-24 DIAGNOSIS — G4700 Insomnia, unspecified: Secondary | ICD-10-CM

## 2015-01-24 MED ORDER — ZOLPIDEM TARTRATE ER 12.5 MG PO TBCR: EXTENDED_RELEASE_TABLET | ORAL | Status: DC

## 2015-01-24 NOTE — Progress Notes (Signed)
Subjective:    Patient ID: NOURA PURPURA, female    DOB: [birth year], 44 y.o.   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 Last here July 22, 2010 Sleep is ok. Still occasional nap mid-day. Sleeps through night. Metadate 20ER - daily, methylphenidate 5mg  - 1 in AM, 2 at lunch. Usually skips Saturdays and Sundays. Menses make HA worse, but her stimulants seem to not affect them. Denies my descriptions of sleep paralysis, cataplexy and hypnagogic hallucination.  Denies chest pain, palpitation, inappropriate anger, Working with headache specialist trying tofranil and pain meds.  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 Patient reports she is doing fine--denies any symptoms She naps whenever she is able to. Continues Metadate 20 mg ER once each morning with methylphenidate 5 mg each morning and at lunch but not on weekends. Occasional supply problem with her drugstore. Topamax has helped her headache.  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 FOLLOWS FOR:has been able to take meds and stay awake. Would like to discuss Flu shot today. She thinks she is doing well with these medications:Ritalin ER 20 mg in the morning, 5 mg in the morning and at noon. Skips weekends. Occasionally uses Ambien CR. 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  FOLLOWS FOR: pt states  she doing well, she states she is taking a little more Ambien than normal depending on how much she is working and the amount of sleep she is getting. Uses Ambien about 5 nights a week. We discussed cognitive behavioral therapy as an alternative. NPSG and MSLT results consistent with narcolepsy syndrome in paper chart prior to 2009 Review of Systems- see HPI Constitutional:   No-   weight loss, night sweats, fevers, chills, +fatigue, lassitude. HEENT:   +  headaches, no-difficulty swallowing, tooth/dental problems, sore throat,       No-  sneezing, itching, ear ache, nasal congestion, post nasal drip,  CV:  No-   chest pain, orthopnea, PND, swelling in lower extremities, anasarca, dizziness, palpitations Resp: No-   shortness of breath with exertion or at rest.              No-   productive cough,  No non-productive cough,  No-  coughing up of blood.              No-   change in color of mucus.  No- wheezing.   Skin: No-   rash or lesions.  GI:  No-   heartburn, indigestion, abdominal pain, nausea, vomiting,  GU: . MS:  No-   joint pain or swelling.   Neuro- nothing unusual Psych:  No- change in mood or affect. No depression or anxiety.  No memory loss    Objective:   Physical Exam General- Alert, Oriented, Affect-appropriate, Distress- none acute  Medium build. Appears well Skin- rash-none, lesions- none, excoriation- none; tatoos Lymphadenopathy- none Head- atraumatic            Eyes- Gross vision intact, PERRLA, conjunctivae clear secretions  Ears- Hearing, canals normal            Nose- Clear, NoSeptal dev, mucus, polyps, erosion, perforation             Throat- Mallampati II , mucosa clear , drainage- none, tonsils- atrophic Neck- flexible , trachea midline, no stridor , thyroid nl, carotid no bruit Chest - symmetrical excursion , unlabored           Heart/CV- RRR , no murmur , no gallop  , no rub, nl s1 s2                           - JVD- none , edema- none, stasis  changes- none, varices- none           Lung- clear to P&A, wheeze- none, cough- none , dullness-none, rub- none           Chest wall-  Abd-  Br/ Gen/ Rectal- Not done, not indicated Extrem- cyanosis- none, clubbing, none, atrophy- none, strength- nl Neuro- grossly intact to observation. No tremor Assessment & Plan:

## 2015-01-24 NOTE — Patient Instructions (Addendum)
Script refilling Ambien  Biofeedback practitioners for Cognitive Behavioral Therapy   Drs Ledon Snare and Delton See  [phone] to help with the insomnia component of your Narcolepsy problem  Ok to continue as you have been doing. Please call as needed

## 2015-02-25 ENCOUNTER — Telehealth: Payer: Self-pay | Admitting: Internal Medicine

## 2015-02-25 MED ORDER — METHYLPHENIDATE HCL 10 MG PO TABS: 10.0000 mg | ORAL_TABLET | Freq: Three times a day (TID) | ORAL | Status: DC

## 2015-02-25 MED ORDER — METHYLPHENIDATE HCL ER 20 MG PO TBCR: EXTENDED_RELEASE_TABLET | ORAL | Status: DC

## 2015-02-25 NOTE — Telephone Encounter (Signed)
Last OV 01/24/15 Pending OV 01/27/16 Last refills: Methylphenidate  08/29/14 #90 1 TID        Methylphenidate  12/31/14 #60 1 BID prn  CY - please advise on refill. Thanks.

## 2015-02-25 NOTE — Telephone Encounter (Signed)
OK to refill

## 2015-02-25 NOTE — Telephone Encounter (Signed)
CY is on vacation this week. Will send to DOD since pt will be out before CY returns.  BQ - please advise. Thanks.

## 2015-02-25 NOTE — Telephone Encounter (Signed)
Rx printed and put at front to be picked up. Patient notified. Nothing further needed.

## 2015-03-01 NOTE — Assessment & Plan Note (Signed)
Typical of narcolepsy, she has trouble staying awake in the daytime and trouble staying asleep at night as part of the same sleep-wake instability. She and I would both like her to explore nonpharmaceutical management of her sleep problems as much as possible. Plan-suggested she look into cognitive behavioral therapy.

## 2015-03-01 NOTE — Assessment & Plan Note (Addendum)
NPSG and MSLT results consistent with narcolepsy syndrome in paper chart prior to 2009 She has been stable for years using methylphenidate for daytime sleepiness and Ambien for nighttime wakefulness at stable dose without complications. Continued emphasis on good sleep hygiene, responsibility to drive safely, appropriate use of naps.

## 2015-05-08 ENCOUNTER — Telehealth: Payer: Self-pay | Admitting: Internal Medicine

## 2015-05-08 MED ORDER — METHYLPHENIDATE HCL 10 MG PO TABS: 10.0000 mg | ORAL_TABLET | Freq: Three times a day (TID) | ORAL | Status: DC

## 2015-05-08 MED ORDER — METHYLPHENIDATE HCL ER 20 MG PO TBCR: EXTENDED_RELEASE_TABLET | ORAL | Status: DC

## 2015-05-08 NOTE — Telephone Encounter (Signed)
Pt is requesting a refill on Methylphenidate 20mg  and 10mg  tablets. Last OV was 01/24/15. Has pending OV on 01/27/16. Last refills were on  02/25/15.  CY - please advise on refills. Thanks.

## 2015-05-08 NOTE — Telephone Encounter (Signed)
rx printed, signed, and placed up front for pickup. Pt aware.  Nothing further needed.

## 2015-05-08 NOTE — Telephone Encounter (Signed)
Ok to refill 

## 2015-05-28 ENCOUNTER — Ambulatory Visit
Admission: RE | Admit: 2015-05-28 | Discharge: 2015-05-28 | Disposition: A | Discharge status: Home / Self Care | Payer: BLUE CROSS/BLUE SHIELD | Source: Ambulatory Visit | Attending: Family Medicine | Admitting: Family Medicine

## 2015-05-28 ENCOUNTER — Other Ambulatory Visit: Payer: Self-pay | Admitting: Family Medicine

## 2015-05-28 DIAGNOSIS — M25572 Pain in left ankle and joints of left foot: Secondary | ICD-10-CM

## 2015-07-08 ENCOUNTER — Telehealth: Payer: Self-pay | Admitting: Internal Medicine

## 2015-07-08 NOTE — Telephone Encounter (Signed)
ATC PT, NA, VM NA. WCB

## 2015-07-09 MED ORDER — METHYLPHENIDATE HCL 10 MG PO TABS: 10.0000 mg | ORAL_TABLET | Freq: Three times a day (TID) | ORAL | Status: DC

## 2015-07-09 MED ORDER — METHYLPHENIDATE HCL ER 20 MG PO TBCR: EXTENDED_RELEASE_TABLET | ORAL | Status: DC

## 2015-07-09 NOTE — Telephone Encounter (Signed)
Rxs printed and placed on CY cart to be signed with envelope with patient name on it. Please place up front to be picked up when signed and document or return to triage to ensure this gets done. Thanks.

## 2015-07-09 NOTE — Telephone Encounter (Signed)
Spoke with pt, requesting a refill on Metadate and Ritalin.  Pt wishes to pick up refills from office.  Last ov: 01/24/15  Last refill on Metadate ER : 05/08/2015 #60 with 0 refills, 2 tabs qd prn.   Last refill on Ritalin : 05/08/2015 #90 with 0 refills, 1 tab TID  CY please advise on refills.  Thanks!

## 2015-07-09 NOTE — Telephone Encounter (Signed)
Rx placed in folder for pick up at front.

## 2015-07-09 NOTE — Telephone Encounter (Signed)
Ok to refill both?? 

## 2015-07-10 NOTE — Telephone Encounter (Signed)
Pt is aware that her prescriptions are ready to be picked up. Nothing further was needed at this time.

## 2015-08-05 ENCOUNTER — Other Ambulatory Visit: Payer: Self-pay | Admitting: Radiology

## 2015-08-18 ENCOUNTER — Other Ambulatory Visit: Payer: Self-pay | Admitting: Internal Medicine

## 2015-08-20 ENCOUNTER — Telehealth: Payer: Self-pay | Admitting: Internal Medicine

## 2015-08-20 DIAGNOSIS — E785 Hyperlipidemia, unspecified: Secondary | ICD-10-CM | POA: Diagnosis not present

## 2015-08-20 DIAGNOSIS — G4730 Sleep apnea, unspecified: Secondary | ICD-10-CM | POA: Diagnosis not present

## 2015-08-20 DIAGNOSIS — J209 Acute bronchitis, unspecified: Secondary | ICD-10-CM | POA: Diagnosis not present

## 2015-08-20 DIAGNOSIS — I10 Essential (primary) hypertension: Secondary | ICD-10-CM | POA: Diagnosis not present

## 2015-08-20 NOTE — Telephone Encounter (Signed)
Ok to refill as before 

## 2015-08-20 NOTE — Telephone Encounter (Signed)
CY please advise on refill. Thanks. 

## 2015-08-20 NOTE — Telephone Encounter (Signed)
[facility] [place]. Per pt's chart, this refill was approved and sent in already this morning.   Called pharmacy, verified that this rx has been sent and received.   Pt aware.  Nothing further needed.

## 2015-08-21 ENCOUNTER — Telehealth: Payer: Self-pay | Admitting: Internal Medicine

## 2015-08-21 NOTE — Telephone Encounter (Signed)
Received records from [facility] for appointment on 09/05/15 with Dr Hilty.  Records given to N Hines (medical records) for Dr Hilty's schedule on 09/05/15 lp °

## 2015-09-04 DIAGNOSIS — M4692 Unspecified inflammatory spondylopathy, cervical region: Secondary | ICD-10-CM | POA: Diagnosis not present

## 2015-09-04 DIAGNOSIS — M4722 Other spondylosis with radiculopathy, cervical region: Secondary | ICD-10-CM | POA: Diagnosis not present

## 2015-09-05 ENCOUNTER — Ambulatory Visit (INDEPENDENT_AMBULATORY_CARE_PROVIDER_SITE_OTHER): Payer: BLUE CROSS/BLUE SHIELD | Admitting: Internal Medicine

## 2015-09-05 ENCOUNTER — Encounter: Payer: Self-pay | Admitting: Internal Medicine

## 2015-09-05 VITALS — BP 136/86 | HR 64 | Ht 64.0 in | Wt 161.5 lb

## 2015-09-05 DIAGNOSIS — I10 Essential (primary) hypertension: Secondary | ICD-10-CM | POA: Diagnosis not present

## 2015-09-05 NOTE — Progress Notes (Signed)
OFFICE NOTE  Chief Complaint:  Labile blood pressure  Primary Care Physician: Denise Hoff, MD  HPI:  Denise Herrera is a 45 y.o. female who is coming referred to me by Dr. Tally Herrera for evaluation of labile blood pressures. According to the records she's had high blood pressure for which she is asymptomatic. She's been on a number of different hypertensive medications before including amlodipine and hydrochlorothiazide. Both medicines made her feel "drained. She was also on by systolic however the cost is excessive for her. She does have Nurse, learning disability but has never used a co-pay assistance card. In addition she was placed on carvedilol 12.5 mg daily, dose which is higher than the equivalent dose of by systolic. This also caused her to feel somewhat fatigued. Blood pressures were optimal during a number of office visits at 120/78. Her blood pressure today was 136/86. At times it was reported blood pressure could be as high as 180s. This makes me concerned about possible whitecoat hypertension. There is not a long standing history of high blood pressure in the family. She reports her mother's side has some high blood pressure but her father does not and siblings do not. 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 She uses CPAP. The stimulants can raise blood pressure and may explain some increased blood pressures during the day. She says she's had the best control of her blood pressure with by systolic. She denies any chest pain, shortness of breath or anginal symptoms.  PMHx:  Past Medical History  Diagnosis Date  . Narcolepsy without cataplexy   . Chronic insomnia   . History of migraine headaches     Past Surgical History  Procedure Laterality Date  . Cesarean section    . Gyn cervical procedure      FAMHx:  Family History  Problem Relation Age of Onset  . Asthma Mother   . Hypertension Mother   . Cancer Mother    breast cancer  . Hypertension Maternal Grandfather   . Diabetes Maternal Grandfather   . Hypertension Maternal Grandmother     SOCHx:   reports that she has never smoked. She has never used smokeless tobacco. She reports that she does not drink alcohol or use illicit drugs.  ALLERGIES:  Allergies  Allergen Reactions  . Amlodipine Besylate     Dizziness, disequilibrium, fatigue  . Armodafinil     REACTION: headaches  . Atorvastatin     fatigue  . Etodolac Itching  . Hydrochlorothiazide     Fatigue/ laziness  . Vicodin [Hydrocodone-Acetaminophen] Itching  . Zolpidem Tartrate     REACTION: forgettfulness    ROS: Pertinent items noted in HPI and remainder of comprehensive ROS otherwise negative.  HOME MEDS: Current Outpatient Prescriptions  Medication Sig Dispense Refill  . acetaminophen (TYLENOL ARTHRITIS PAIN) 650 MG CR tablet Take 650 mg by mouth as needed for pain.    Marland Kitchen atorvastatin (LIPITOR) 20 MG tablet Take 20 mg by mouth daily.    Marland Kitchen BYSTOLIC 5 MG tablet Take 1 tablet by mouth daily.  4  . cetirizine (ZYRTEC) 10 MG tablet Take 10 mg by mouth daily.      . diphenhydrAMINE (SOMINEX) 25 MG tablet Take 25 mg by mouth at bedtime as needed for sleep.    . methylphenidate (METADATE ER) 20 MG ER tablet Twice daily as needed 60 tablet 0  . methylphenidate (RITALIN) 10 MG tablet Take 1 tablet (10  mg total) by mouth 3 (three) times daily. 90 tablet 0  . RELPAX 40 MG tablet   1  . triamcinolone (NASACORT ALLERGY 24HR) 55 MCG/ACT AERO nasal inhaler Place 2 sprays into the Herrera daily.    Marland Kitchen. zolpidem (AMBIEN CR) 12.5 MG CR tablet TAKE 1 TABLET BY MOUTH AT BEDTIME AS NEEDED 30 tablet 5   No current facility-administered medications for this visit.    LABS/IMAGING: No results found for this or any previous visit (from the past 48 hour(s)). No results found.  WEIGHTS: Wt Readings from Last 3 Encounters:  09/05/15 161 lb 8 oz (73.256 kg)  01/24/15 160 lb 6.4 oz (72.757 kg)    01/23/14 161 lb 6.4 oz (73.211 kg)    VITALS: BP 136/86 mmHg  Pulse 64  Ht 5\' 4"  (1.626 m)  Wt 161 lb 8 oz (73.256 kg)  BMI 27.71 kg/m2  EXAM: General appearance: alert and no distress Neck: no carotid bruit and no JVD Lungs: clear to auscultation bilaterally Heart: regular rate and rhythm, S1, S2 normal, no murmur, click, rub or gallop Abdomen: soft, non-tender; bowel sounds normal; no masses,  no organomegaly Extremities: extremities normal, atraumatic, no cyanosis or edema Pulses: 2+ and symmetric Skin: Skin color, texture, turgor normal. No rashes or lesions Neurologic: Grossly normal Psych: Pleasant  EKG: Normal sinus rhythm at 65  ASSESSMENT: 1. Hypertension-with a possible whitecoat component  PLAN: 1.   Mrs. Denise Herrera has had some mild hypertension which seems to be controlled with Bystolic. When she's been on additional medication such as amlodipine, HCTZ or carvedilol, she felt very fatigued, washed out and sluggish. She also had some orthostatic symptoms suggesting that she may been overtreated. At times her blood pressures were high, but that may be related to white coat.  Blood pressure looks good today on Bystolic. I would like her to continue on that and will provide samples and a co-pay assistance card. Hopefully this will help with that issue of cost and we will not have to switch medications. Of also advised her to get a blood pressure cuff and take home readings once or twice a day at random times over the next couple of weeks. Then she will follow-up with Denise Herrera, our antihypertension pharmacist for further evaluation of blood pressure and to see if there is a need for additional medication.  Thanks for the kind referral.  Denise NoseKenneth C. Chloe Baig, MD, [facility] Attending Cardiologist [facility]  Denise NoseKenneth C Jerimiah Herrera 09/05/2015, 5:13 PM

## 2015-09-05 NOTE — Patient Instructions (Signed)
Dr. Rennis GoldenHilty recommends an OMRON arm cuff - monitor 1-2 times daily  Your physician recommends that you schedule a follow-up appointment in 2 weeks with Belenda CruiseKristin (clinical pharmacist) for a blood pressure check -- please bring a list of your BP readings and your cuff to your appointment

## 2015-09-06 ENCOUNTER — Telehealth: Payer: Self-pay | Admitting: Internal Medicine

## 2015-09-06 MED ORDER — METHYLPHENIDATE HCL 10 MG PO TABS: 10.0000 mg | ORAL_TABLET | Freq: Three times a day (TID) | ORAL | Status: DC

## 2015-09-06 MED ORDER — METHYLPHENIDATE HCL ER 20 MG PO TBCR: EXTENDED_RELEASE_TABLET | ORAL | Status: DC

## 2015-09-06 NOTE — Telephone Encounter (Signed)
rx signed and placed up front for pickup.  Pt aware.  Nothing further needed.  

## 2015-09-06 NOTE — Telephone Encounter (Signed)
Spoke with pt, requesting refill on Metadate and Ritalin. Last refill: Metadate ER 20mg  #60 with 0 refills on 07/09/15, take 2 tabs qd prn. Ritalin 10mg  #90 with 0 refills on 07/09/15, take 3 tabs qd.  Pt wishes to pick up rx from office.    Last ov: 01/24/15 Next ov: 01/27/16  CY please advise on refills.  Thanks!

## 2015-09-06 NOTE — Telephone Encounter (Signed)
Refills ok 

## 2015-09-10 DIAGNOSIS — M542 Cervicalgia: Secondary | ICD-10-CM | POA: Diagnosis not present

## 2015-09-24 DIAGNOSIS — M5412 Radiculopathy, cervical region: Secondary | ICD-10-CM | POA: Diagnosis not present

## 2015-09-30 DIAGNOSIS — M5412 Radiculopathy, cervical region: Secondary | ICD-10-CM | POA: Diagnosis not present

## 2015-10-08 DIAGNOSIS — M5412 Radiculopathy, cervical region: Secondary | ICD-10-CM | POA: Diagnosis not present

## 2015-10-21 DIAGNOSIS — M5412 Radiculopathy, cervical region: Secondary | ICD-10-CM | POA: Diagnosis not present

## 2015-11-03 DIAGNOSIS — J22 Unspecified acute lower respiratory infection: Secondary | ICD-10-CM | POA: Diagnosis not present

## 2015-11-03 DIAGNOSIS — R05 Cough: Secondary | ICD-10-CM | POA: Diagnosis not present

## 2015-11-20 ENCOUNTER — Telehealth: Payer: Self-pay | Admitting: Internal Medicine

## 2015-11-20 MED ORDER — METHYLPHENIDATE HCL ER 20 MG PO TBCR: EXTENDED_RELEASE_TABLET | ORAL | Status: DC

## 2015-11-20 MED ORDER — METHYLPHENIDATE HCL 10 MG PO TABS: 10.0000 mg | ORAL_TABLET | Freq: Three times a day (TID) | ORAL | Status: DC

## 2015-11-20 NOTE — Telephone Encounter (Signed)
Pt is requesting refill on her methylphenidate 20 ER and methylphenidate 10mg .   Methylphenidate 20mg  ER Take twice daily as needed # 60 with 0 refills Last refilled on 09/06/15  Methylphenidate 10mg  Take 1 tablet by mouth three times a day #90 with 0 refills  CY please advise if okay to refill both meds  Allergies  Allergen Reactions  . Amlodipine Besylate     Dizziness, disequilibrium, fatigue  . Armodafinil     REACTION: headaches  . Atorvastatin     fatigue  . Etodolac Itching  . Hydrochlorothiazide     Fatigue/ laziness  . Vicodin [Hydrocodone-Acetaminophen] Itching  . Zolpidem Tartrate     REACTION: forgettfulness    Current outpatient prescriptions:  .  acetaminophen (TYLENOL ARTHRITIS PAIN) 650 MG CR tablet, Take 650 mg by mouth as needed for pain., Disp: , Rfl:  .  atorvastatin (LIPITOR) 20 MG tablet, Take 20 mg by mouth daily., Disp: , Rfl:  .  BYSTOLIC 5 MG tablet, Take 1 tablet by mouth daily., Disp: , Rfl: 4 .  cetirizine (ZYRTEC) 10 MG tablet, Take 10 mg by mouth daily.  , Disp: , Rfl:  .  diphenhydrAMINE (SOMINEX) 25 MG tablet, Take 25 mg by mouth at bedtime as needed for sleep., Disp: , Rfl:  .  methylphenidate (METADATE ER) 20 MG ER tablet, Twice daily as needed, Disp: 60 tablet, Rfl: 0 .  methylphenidate (RITALIN) 10 MG tablet, Take 1 tablet (10 mg total) by mouth 3 (three) times daily., Disp: 90 tablet, Rfl: 0 .  RELPAX 40 MG tablet, , Disp: , Rfl: 1 .  triamcinolone (NASACORT ALLERGY 24HR) 55 MCG/ACT AERO nasal inhaler, Place 2 sprays into the nose daily., Disp: , Rfl:  .  zolpidem (AMBIEN CR) 12.5 MG CR tablet, TAKE 1 TABLET BY MOUTH AT BEDTIME AS NEEDED, Disp: 30 tablet, Rfl: 5

## 2015-11-20 NOTE — Telephone Encounter (Signed)
Called spoke with pt. Informed her of CY'recs and that both rx have been placed at the front office for pick up. She voiced understanding and had no further questions. Rx placed at the front for pick up. Nothing further needed.

## 2015-11-20 NOTE — Telephone Encounter (Signed)
Ok to refill 

## 2016-01-17 ENCOUNTER — Telehealth: Payer: Self-pay | Admitting: Internal Medicine

## 2016-01-17 NOTE — Telephone Encounter (Signed)
Rx refill will need to wait until CY's return on 01-23-16. Thanks.

## 2016-01-17 NOTE — Telephone Encounter (Signed)
Pt will be out of Rx on Tuesday-per SN he is okay with giving patient a short term supply rx for 1 week. Please inform pt on Tuesday and print Rx for SN to sign.

## 2016-01-17 NOTE — Telephone Encounter (Signed)
Called and spoke with pt and she stated that she needs a refill of the methyphenidate 10 mg  And 20 mg .  She stated that she has enough to last until Tuesday.  CY will not be back until Wednesday.  SN please advise. Thanks  Allergies  Allergen Reactions  . Amlodipine Besylate     Dizziness, disequilibrium, fatigue  . Armodafinil     REACTION: headaches  . Atorvastatin     fatigue  . Etodolac Itching  . Hydrochlorothiazide     Fatigue/ laziness  . Vicodin [Hydrocodone-Acetaminophen] Itching  . Zolpidem Tartrate     REACTION: forgettfulness

## 2016-01-21 MED ORDER — METHYLPHENIDATE HCL ER 20 MG PO TBCR: EXTENDED_RELEASE_TABLET | ORAL | 0 refills | Status: DC

## 2016-01-21 MED ORDER — METHYLPHENIDATE HCL 10 MG PO TABS: 10.0000 mg | ORAL_TABLET | Freq: Three times a day (TID) | ORAL | 0 refills | Status: DC

## 2016-01-21 NOTE — Telephone Encounter (Signed)
Pt aware that Dr Kriste BasqueNadel has okayed a 1 week supply.  Rx printed and placed on SN cart to sign.  Pt to pick up around lunch.

## 2016-01-21 NOTE — Telephone Encounter (Signed)
rx has been signed by SN and placed up front to pick up. Nothing further is needed.

## 2016-01-27 ENCOUNTER — Encounter: Payer: Self-pay | Admitting: Internal Medicine

## 2016-01-27 ENCOUNTER — Ambulatory Visit (INDEPENDENT_AMBULATORY_CARE_PROVIDER_SITE_OTHER): Payer: BLUE CROSS/BLUE SHIELD | Admitting: Internal Medicine

## 2016-01-27 DIAGNOSIS — G47419 Narcolepsy without cataplexy: Secondary | ICD-10-CM

## 2016-01-27 DIAGNOSIS — G4700 Insomnia, unspecified: Secondary | ICD-10-CM | POA: Diagnosis not present

## 2016-01-27 MED ORDER — METHYLPHENIDATE HCL 10 MG PO TABS: 10.0000 mg | ORAL_TABLET | Freq: Three times a day (TID) | ORAL | 0 refills | Status: DC

## 2016-01-27 MED ORDER — METHYLPHENIDATE HCL ER 20 MG PO TBCR: EXTENDED_RELEASE_TABLET | ORAL | 0 refills | Status: DC

## 2016-01-27 NOTE — Patient Instructions (Signed)
Refill scripts printed for Ritalin and Metadate ER  Ok to continue [place] as needed  Please call as needed

## 2016-01-27 NOTE — Assessment & Plan Note (Signed)
She has worked out a very stable rhythm that is satisfactory for her quality of life. Okay to continue Metadate and Ritalin as prescribed. Controlled medication issues again discussed. No problems.

## 2016-01-27 NOTE — Assessment & Plan Note (Signed)
Common problem with many narcoleptics as sleep stage control is inconsistent throughout day and night. Ambien has worked well for her with no sleepwalking. Plan-okay to continue Ambien.

## 2016-01-27 NOTE — Progress Notes (Signed)
Subjective:    Patient ID: Denise Herrera, female    DOB: [birth year], 45 y.o.   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 Last here July 22, 2010 Sleep is ok. Still occasional nap mid-day. Sleeps through night. Metadate 20ER - daily, methylphenidate 5mg  - 1 in AM, 2 at lunch. Usually skips Saturdays and Sundays. Menses make HA worse, but her stimulants seem to not affect them. Denies my descriptions of sleep paralysis, cataplexy and hypnagogic hallucination.  Denies chest pain, palpitation, inappropriate anger, Working with headache specialist trying tofranil and pain meds.  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 Patient reports she is doing fine--denies any symptoms She naps whenever she is able to. Continues Metadate 20 mg ER once each morning with methylphenidate 5 mg each morning and at lunch but not on weekends. Occasional supply problem with her drugstore. Topamax has helped her headache.  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 FOLLOWS FOR:has been able to take meds and stay awake. Would like to discuss Flu shot today. She thinks she is doing well with these medications:Ritalin ER 20 mg in the morning, 5 mg in the morning and at noon. Skips weekends. Occasionally uses Ambien CR. 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 FOLLOWS FOR: pt states  she doing well, she states she is taking a little more Ambien than normal depending on how much she is working and the amount of sleep she is getting. Uses Ambien about 5 nights a week. We discussed cognitive behavioral therapy as an alternative. NPSG and MSLT results consistent with narcolepsy syndrome in paper chart prior to 2009  01/27/2016-45 year old female never smoker followed for Narcolepsy without cataplexy, chronic insomnia, Located by history of migraine FOLLOW FOR:  narcolepsy, no concerns. She feels very stable, able to continue working without problems. Skips methylphenidate on weekends. On a typical workday she takes Metadate ER once and Ritalin 10 mg, usually twice. She tries to stretch out her prescription. No concerns about intolerance, side effects or misuse. Uses Ambien most work nights to help sleep. Occasional nap.  Review of Systems- see HPI Constitutional:   No-   weight loss, night sweats, fevers, chills, +fatigue, lassitude. HEENT:   +  headaches, no-difficulty swallowing, tooth/dental problems, sore throat,       No-  sneezing, itching, ear ache, nasal congestion, post nasal drip,  CV:  No-   chest pain, orthopnea, PND, swelling in lower extremities, anasarca, dizziness, palpitations Resp: No-   shortness of breath with exertion or at rest.              No-   productive cough,  No non-productive cough,  No-  coughing up of blood.              No-   change in color of mucus.  No- wheezing.   Skin: No-   rash or lesions.  GI:  No-   heartburn, indigestion, abdominal pain, nausea, vomiting,  GU: . MS:  No-   joint pain or swelling.   Neuro-  nothing unusual Psych:  No- change in mood or affect. No depression or anxiety.  No memory loss    Objective:   Physical Exam General- Alert, Oriented, Affect-appropriate, Distress- none acute  Medium build. Appears well Skin- rash-none, lesions- none, excoriation- none; tatoos Lymphadenopathy- none Head- atraumatic             Eyes- Gross vision intact, PERRLA, conjunctivae clear secretions            Ears- Hearing, canals normal            Nose- Clear, NoSeptal dev, mucus, polyps, erosion, perforation             Throat- Mallampati II , mucosa clear , drainage- none, tonsils- atrophic Neck- flexible , trachea midline, no stridor , thyroid nl, carotid no bruit Chest - symmetrical excursion , unlabored           Heart/CV- RRR , no murmur , no gallop  , no rub, nl s1 s2                           - JVD- none , edema- none, stasis changes- none, varices- none           Lung- clear to P&A, wheeze- none, cough- none , dullness-none, rub- none           Chest wall-  Abd-  Br/ Gen/ Rectal- Not done, not indicated Extrem- cyanosis- none, clubbing, none, atrophy- none, strength- nl Neuro- grossly intact to observation. No tremor Assessment & Plan:

## 2016-02-20 DIAGNOSIS — J309 Allergic rhinitis, unspecified: Secondary | ICD-10-CM | POA: Diagnosis not present

## 2016-02-20 DIAGNOSIS — G43909 Migraine, unspecified, not intractable, without status migrainosus: Secondary | ICD-10-CM | POA: Diagnosis not present

## 2016-02-20 DIAGNOSIS — E785 Hyperlipidemia, unspecified: Secondary | ICD-10-CM | POA: Diagnosis not present

## 2016-02-20 DIAGNOSIS — I10 Essential (primary) hypertension: Secondary | ICD-10-CM | POA: Diagnosis not present

## 2016-03-25 ENCOUNTER — Telehealth: Payer: Self-pay | Admitting: Internal Medicine

## 2016-03-25 NOTE — Telephone Encounter (Signed)
Please advise if okay to refill Ambien  Pt last had this prescribed on 08-20-15 12.5 mg # 30 with 5 rf  Last ov 01-27-16  No ov pending

## 2016-03-26 MED ORDER — ZOLPIDEM TARTRATE ER 12.5 MG PO TBCR: 12.5000 mg | EXTENDED_RELEASE_TABLET | Freq: Every evening | ORAL | 1 refills | Status: DC | PRN

## 2016-03-26 NOTE — Telephone Encounter (Signed)
Ok refill 6 months 

## 2016-03-26 NOTE — Telephone Encounter (Signed)
Spoke with pt. She is aware that we will be calling in her prescription. Rx has been called in. Nothing further was needed.

## 2016-04-06 ENCOUNTER — Telehealth: Payer: Self-pay | Admitting: Internal Medicine

## 2016-04-06 MED ORDER — METHYLPHENIDATE HCL ER 20 MG PO TBCR: EXTENDED_RELEASE_TABLET | ORAL | 0 refills | Status: DC

## 2016-04-06 MED ORDER — METHYLPHENIDATE HCL 10 MG PO TABS: 10.0000 mg | ORAL_TABLET | Freq: Three times a day (TID) | ORAL | 0 refills | Status: DC

## 2016-04-06 NOTE — Telephone Encounter (Signed)
Called and spoke with pt and she is needing a refill of the methylphenidate 20 mg and 10 mg.  This was last filled on 01/27/16 at her last OV.  CY please advise if ok to refill.  Thanks  Next ov--no pending appts.   Allergies  Allergen Reactions  . Amlodipine Besylate     Dizziness, disequilibrium, fatigue  . Armodafinil     REACTION: headaches  . Atorvastatin     fatigue  . Etodolac Itching  . Hydrochlorothiazide     Fatigue/ laziness  . Vicodin [Hydrocodone-Acetaminophen] Itching  . Zolpidem Tartrate     REACTION: forgettfulness

## 2016-04-06 NOTE — Telephone Encounter (Signed)
Ok to refill both?? 

## 2016-04-06 NOTE — Telephone Encounter (Signed)
rx printed, signed by CY, and placed in brown accordion file for pickup. Pt aware.   Nothing further needed.

## 2016-06-22 ENCOUNTER — Telehealth: Payer: Self-pay | Admitting: Internal Medicine

## 2016-06-22 NOTE — Telephone Encounter (Signed)
Pt requesting a refill on metadate and ritalin. Last refills- Medtadate 20mg  ER on 04/06/2016 #60, 1 po bid prn with 0 refills Ritalin 10mg  on 04/06/2016 #90 with 0 refills, 1 po tid with 0 refills  Pt requests to pick this rx up in office.  CY please advise on refill.  Thanks!

## 2016-06-23 MED ORDER — METHYLPHENIDATE HCL 10 MG PO TABS: 10.0000 mg | ORAL_TABLET | Freq: Three times a day (TID) | ORAL | 0 refills | Status: DC

## 2016-06-23 MED ORDER — METHYLPHENIDATE HCL ER 20 MG PO TBCR: EXTENDED_RELEASE_TABLET | ORAL | 0 refills | Status: DC

## 2016-06-23 NOTE — Telephone Encounter (Signed)
Patient called back, states she will pick these RX's up from the office.  CB is [phone].

## 2016-06-23 NOTE — Telephone Encounter (Signed)
Attempted to contact pt. Received a message stating, "The wireless customer that you are trying to reach is unavailable at this time. Please try your call again later." We need to know if the pt wants these prescriptions mailed or if she will pick them up.

## 2016-06-23 NOTE — Telephone Encounter (Signed)
Rx placed up front in brown folder for pick up. Pt aware & voiced her understanding. Nothing further needed.

## 2016-06-23 NOTE — Telephone Encounter (Signed)
Ok to refill both?? 

## 2016-06-25 ENCOUNTER — Other Ambulatory Visit: Payer: Self-pay | Admitting: Family Medicine

## 2016-06-25 ENCOUNTER — Ambulatory Visit
Admission: RE | Admit: 2016-06-25 | Discharge: 2016-06-25 | Disposition: A | Discharge status: Home / Self Care | Payer: BLUE CROSS/BLUE SHIELD | Source: Ambulatory Visit | Attending: Family Medicine | Admitting: Family Medicine

## 2016-06-25 DIAGNOSIS — M545 Low back pain, unspecified: Secondary | ICD-10-CM

## 2016-06-25 DIAGNOSIS — M25552 Pain in left hip: Secondary | ICD-10-CM | POA: Diagnosis not present

## 2016-06-25 DIAGNOSIS — R1032 Left lower quadrant pain: Secondary | ICD-10-CM | POA: Diagnosis not present

## 2016-08-04 DIAGNOSIS — Z09 Encounter for follow-up examination after completed treatment for conditions other than malignant neoplasm: Secondary | ICD-10-CM | POA: Diagnosis not present

## 2016-08-04 DIAGNOSIS — Z803 Family history of malignant neoplasm of breast: Secondary | ICD-10-CM | POA: Diagnosis not present

## 2016-08-04 DIAGNOSIS — R922 Inconclusive mammogram: Secondary | ICD-10-CM | POA: Diagnosis not present

## 2016-08-05 DIAGNOSIS — H5213 Myopia, bilateral: Secondary | ICD-10-CM | POA: Diagnosis not present

## 2016-09-04 ENCOUNTER — Telehealth: Payer: Self-pay | Admitting: Internal Medicine

## 2016-09-04 MED ORDER — METHYLPHENIDATE HCL ER 20 MG PO TBCR: EXTENDED_RELEASE_TABLET | ORAL | 0 refills | Status: DC

## 2016-09-04 MED ORDER — METHYLPHENIDATE HCL 10 MG PO TABS: 10.0000 mg | ORAL_TABLET | Freq: Three times a day (TID) | ORAL | 0 refills | Status: DC

## 2016-09-04 NOTE — Telephone Encounter (Signed)
rx refilled and placed in brown accordion folder up front.  Pt aware of refill.  Nothing further needed.

## 2016-09-04 NOTE — Telephone Encounter (Signed)
Ok to refill 

## 2016-09-04 NOTE — Telephone Encounter (Signed)
Called and spoke with pt and she is requesting refill of the metadate  20 mg  BID and the ritalin 10 mg  TID.  Pt would like to pick these up.  CY please advise on refill.  Thanks  Last ov--01/27/16 Next ov--none pending  Allergies  Allergen Reactions  . Amlodipine Besylate     Dizziness, disequilibrium, fatigue  . Armodafinil     REACTION: headaches  . Atorvastatin     fatigue  . Etodolac Itching  . Hydrochlorothiazide     Fatigue/ laziness  . Vicodin [Hydrocodone-Acetaminophen] Itching  . Zolpidem Tartrate     REACTION: forgettfulness

## 2016-09-25 DIAGNOSIS — G43909 Migraine, unspecified, not intractable, without status migrainosus: Secondary | ICD-10-CM | POA: Diagnosis not present

## 2016-09-25 DIAGNOSIS — I10 Essential (primary) hypertension: Secondary | ICD-10-CM | POA: Diagnosis not present

## 2016-09-25 DIAGNOSIS — E7800 Pure hypercholesterolemia, unspecified: Secondary | ICD-10-CM | POA: Diagnosis not present

## 2016-09-25 DIAGNOSIS — J309 Allergic rhinitis, unspecified: Secondary | ICD-10-CM | POA: Diagnosis not present

## 2016-11-16 ENCOUNTER — Telehealth: Payer: Self-pay | Admitting: Internal Medicine

## 2016-11-16 MED ORDER — METHYLPHENIDATE HCL ER 20 MG PO TBCR: EXTENDED_RELEASE_TABLET | ORAL | 0 refills | Status: DC

## 2016-11-16 MED ORDER — METHYLPHENIDATE HCL 10 MG PO TABS: 10.0000 mg | ORAL_TABLET | Freq: Three times a day (TID) | ORAL | 0 refills | Status: DC

## 2016-11-16 NOTE — Telephone Encounter (Signed)
Spoke with pt. She is requesting a refill on Ritalin and Metadate. Her last OV was with CY on 01/27/16. She has a pending OV with CY on 02/25/17. Last refill on both medications were on 09/04/16.  CY-  Please advise on refill. Thanks.

## 2016-11-16 NOTE — Telephone Encounter (Signed)
RX has been printed and signed by CY. Patient is aware RXs are ready for pickup and her to bring her ID with her. She verbalized understanding. RX has been placed up front.

## 2016-11-16 NOTE — Telephone Encounter (Signed)
Ok to refill both?? 

## 2017-01-12 ENCOUNTER — Telehealth: Payer: Self-pay | Admitting: Internal Medicine

## 2017-01-12 MED ORDER — ZOLPIDEM TARTRATE ER 12.5 MG PO TBCR: 12.5000 mg | EXTENDED_RELEASE_TABLET | Freq: Every evening | ORAL | 1 refills | Status: DC | PRN

## 2017-01-12 NOTE — Telephone Encounter (Signed)
Ok to refill 

## 2017-01-12 NOTE — Telephone Encounter (Signed)
Spoke with patient. She is requesting a refill on her Ambien 12.5mg .   Last OV: 01/27/16 Next OV: 02/25/17 Last Refill: 03/26/16 for 90 tablets with 1 refill.   Pt wishes to use [facility] in [place].   CY, please advise if this refill is appropriate. Thanks!

## 2017-01-12 NOTE — Telephone Encounter (Signed)
Patient is aware that CY has approved this medication. Medication has been called into pharmacy. Nothing else needed at time of call.

## 2017-01-15 ENCOUNTER — Encounter: Payer: Self-pay | Admitting: Internal Medicine

## 2017-01-15 ENCOUNTER — Ambulatory Visit (INDEPENDENT_AMBULATORY_CARE_PROVIDER_SITE_OTHER): Payer: BLUE CROSS/BLUE SHIELD | Admitting: Internal Medicine

## 2017-01-15 ENCOUNTER — Ambulatory Visit: Payer: BLUE CROSS/BLUE SHIELD | Admitting: Internal Medicine

## 2017-01-15 VITALS — BP 148/100 | HR 60 | Ht 64.0 in | Wt 167.6 lb

## 2017-01-15 DIAGNOSIS — I10 Essential (primary) hypertension: Secondary | ICD-10-CM | POA: Diagnosis not present

## 2017-01-15 DIAGNOSIS — G47419 Narcolepsy without cataplexy: Secondary | ICD-10-CM

## 2017-01-15 MED ORDER — BYSTOLIC 5 MG PO TABS: ORAL_TABLET | ORAL | 11 refills | Status: DC

## 2017-01-15 NOTE — Patient Instructions (Addendum)
Your physician has recommended you make the following change in your medication:  -- INCREASE bystolic to 7.5mg  Monday-Friday  Dr. Rennis GoldenHilty has requested that you schedule an appointment with one of our clinical pharmacists for a blood pressure check appointment within the next 2-3 weeks.  -- if you monitor your blood pressure (BP) at home, please bring your BP cuff and your BP readings with you to this appointment -- please check your BP no more than twice daily, after you have been sitting/resting for 5-10 minutes, at least 1 hour after taking your BP medications  Your physician wants you to follow-up in: ONE YEAR with Dr. Rennis GoldenHilty. You will receive a reminder letter in the mail two months in advance. If you don't receive a letter, please call our office to schedule the follow-up appointment.

## 2017-01-15 NOTE — Progress Notes (Signed)
OFFICE NOTE  Chief Complaint:  Follow-up of pressure  Primary Care Physician: Denise JoeSwayne, David, MD  HPI:  Denise ByesDonna D Herrera is a 46 y.o. female who is coming referred to me by Dr. Tally Joeavid Swayne for evaluation of labile blood pressures. According to the records she's had high blood pressure for which she is asymptomatic. She's been on a number of different hypertensive medications before including amlodipine and hydrochlorothiazide. Both medicines made her feel "drained. She was also on by systolic however the cost is excessive for her. She does have Nurse, learning disabilitycommercial insurance but has never used a co-pay assistance card. In addition she was placed on carvedilol 12.5 mg daily, dose which is higher than the equivalent dose of by systolic. This also caused her to feel somewhat fatigued. Blood pressures were optimal during a number of office visits at 120/78. Her blood pressure today was 136/86. At times it was reported blood pressure could be as high as 180s. This makes me concerned about possible whitecoat hypertension. There is not a long standing history of high blood pressure in the family. She reports her mother's side has some high blood pressure but her father does not and siblings do not. 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 She uses CPAP. The stimulants can raise blood pressure and may explain some increased blood pressures during the day. She says she's had the best control of her blood pressure with by systolic. She denies any chest pain, shortness of breath or anginal symptoms.  01/15/2017  Mrs. Denise Herrera returns today for follow-up of blood pressure. She reports her in the week her blood pressures are running a little higher. This may be related to her taking Ritalin. On the weekends she does not take the medicine. She is on the Ritalin for narcolepsy. She does use CPAP. She denies any chest pain or worsening shortness of breath.  PMHx:  Past  Medical History:  Diagnosis Date  . Chronic insomnia   . History of migraine headaches   . Narcolepsy without cataplexy(347.00)     Past Surgical History:  Procedure Laterality Date  . CESAREAN SECTION    . gyn cervical procedure      FAMHx:  Family History  Problem Relation Age of Onset  . Asthma Mother   . Hypertension Mother   . Cancer Mother        breast cancer  . Hypertension Maternal Grandfather   . Diabetes Maternal Grandfather   . Hypertension Maternal Grandmother     SOCHx:   reports that she has never smoked. She has never used smokeless tobacco. She reports that she does not drink alcohol or use drugs.  ALLERGIES:  Allergies  Allergen Reactions  . Amlodipine Besylate     Dizziness, disequilibrium, fatigue  . Armodafinil     REACTION: headaches  . Atorvastatin     fatigue  . Etodolac Itching  . Hydrochlorothiazide     Fatigue/ laziness  . Vicodin [Hydrocodone-Acetaminophen] Itching  . Zolpidem Tartrate     REACTION: forgettfulness    ROS: Pertinent items noted in HPI and remainder of comprehensive ROS otherwise negative.  HOME MEDS: Current Outpatient Prescriptions  Medication Sig Dispense Refill  . atorvastatin (LIPITOR) 20 MG tablet Take 20 mg by mouth daily.    Marland Kitchen. BYSTOLIC 5 MG tablet Take 1 tablet by mouth daily.  4  . cetirizine (ZYRTEC) 10 MG tablet Take 10 mg by mouth daily.      .Marland Kitchen  diphenhydrAMINE (SOMINEX) 25 MG tablet Take 25 mg by mouth at bedtime as needed for sleep.    . methylphenidate (METADATE ER) 20 MG ER tablet Twice daily as needed 60 tablet 0  . methylphenidate (RITALIN) 10 MG tablet Take 1 tablet (10 mg total) by mouth 3 (three) times daily. 90 tablet 0  . naproxen (NAPROSYN) 500 MG tablet Take 1 tablet by mouth as directed.  2  . RELPAX 40 MG tablet   1  . triamcinolone (NASACORT ALLERGY 24HR) 55 MCG/ACT AERO nasal inhaler Place 2 sprays into the nose daily.    Marland Kitchen zolpidem (AMBIEN CR) 12.5 MG CR tablet Take 1 tablet (12.5 mg  total) by mouth at bedtime as needed. 90 tablet 1   No current facility-administered medications for this visit.     LABS/IMAGING: No results found for this or any previous visit (from the past 48 hour(s)). No results found.  WEIGHTS: Wt Readings from Last 3 Encounters:  01/15/17 167 lb 9.6 oz (76 kg)  01/27/16 168 lb 12.8 oz (76.6 kg)  09/05/15 161 lb 8 oz (73.3 kg)    VITALS: BP (!) 148/100   Pulse 60   Ht 5\' 4"  (1.626 m)   Wt 167 lb 9.6 oz (76 kg)   BMI 28.77 kg/m   EXAM: General appearance: alert and no distress Neck: no carotid bruit and no JVD Lungs: clear to auscultation bilaterally Heart: regular rate and rhythm, S1, S2 normal, no murmur, click, rub or gallop Abdomen: soft, non-tender; bowel sounds normal; no masses,  no organomegaly Extremities: extremities normal, atraumatic, no cyanosis or edema Pulses: 2+ and symmetric Skin: Skin color, texture, turgor normal. No rashes or lesions Neurologic: Grossly normal Psych: Pleasant  EKG: Sinus rhythm at 60, possible left atrial enlargement-personally reviewed  ASSESSMENT: 1. Essential hypertension 2. Narcolepsy on stimulants  PLAN: 1.   Denise Herrera seems to run higher blood pressures during the week when she is on Ritalin but not on the weekends when she is not taking it. I recommend increasing her Bystolic to 7.5 mg during the week and then continue with 5 mg on the weekends. She should follow her blood pressures when she returns from work during the week and follow-up with her pharmacy hypertension clinic in 2-3 weeks. Follow-up with me then annually.  Chrystie Nose, MD, [facility] Attending Cardiologist [facility]  Chrystie Nose 01/15/2017, 4:36 PM

## 2017-01-22 ENCOUNTER — Encounter: Payer: Self-pay | Admitting: Internal Medicine

## 2017-01-22 ENCOUNTER — Telehealth: Payer: Self-pay | Admitting: Internal Medicine

## 2017-01-22 MED ORDER — METHYLPHENIDATE HCL 10 MG PO TABS: 10.0000 mg | ORAL_TABLET | Freq: Three times a day (TID) | ORAL | 0 refills | Status: DC

## 2017-01-22 MED ORDER — METHYLPHENIDATE HCL ER 20 MG PO TBCR: EXTENDED_RELEASE_TABLET | ORAL | 0 refills | Status: DC

## 2017-01-22 NOTE — Telephone Encounter (Signed)
I have called the pt and she is aware of rx that have been signed and placed up front for her to come by and pick this up.

## 2017-01-22 NOTE — Telephone Encounter (Signed)
Ok to refill both?? 

## 2017-01-22 NOTE — Telephone Encounter (Signed)
Called and spoke with pt and she stated that she is needing a refill of her 2 medication of methylphenidate.  These were last refilled on 7/2.  CY please advise if you are ok to refill these.  Pt would like to pick these up.  Thanks

## 2017-01-27 ENCOUNTER — Ambulatory Visit (INDEPENDENT_AMBULATORY_CARE_PROVIDER_SITE_OTHER): Payer: BLUE CROSS/BLUE SHIELD | Admitting: Pharmacist

## 2017-01-27 VITALS — BP 142/90 | HR 62

## 2017-01-27 DIAGNOSIS — I10 Essential (primary) hypertension: Secondary | ICD-10-CM

## 2017-01-27 NOTE — Patient Instructions (Addendum)
Return for a  follow up appointment in 5 weeks  Your blood pressure today is 142/90 pulse 62  Check your blood pressure at home daily (if able) and keep record of the readings.  Take your BP meds as follows: *All medication as prescribed*  Bring all of your meds, your BP cuff and your record of home blood pressures to your next appointment.  Exercise as you're able, try to walk approximately 30 minutes per day.  Keep salt intake to a minimum, especially watch canned and prepared boxed foods.  Eat more fresh fruits and vegetables and fewer canned items.  Avoid eating in fast food restaurants.    HOW TO TAKE YOUR BLOOD PRESSURE: . Rest 5 minutes before taking your blood pressure. .  Don't smoke or drink caffeinated beverages for at least 30 minutes before. . Take your blood pressure before (not after) you eat. . Sit comfortably with your back supported and both feet on the floor (don't cross your legs). . Elevate your arm to heart level on a table or a desk. . Use the proper sized cuff. It should fit smoothly and snugly around your bare upper arm. There should be enough room to slip a fingertip under the cuff. The bottom edge of the cuff should be 1 inch above the crease of the elbow. . Ideally, take 3 measurements at one sitting and record the average.

## 2017-01-27 NOTE — Progress Notes (Signed)
kla opened in error

## 2017-01-27 NOTE — Progress Notes (Signed)
Patient ID: NAKIEA METZNER                 DOB: [birth year]                      MRN: [id number]     HPI: Denise Herrera is a 46 y.o. female referred by Dr. Rennis Golden to HTN clinic. PMH includes chronic insomnia, migraines, narcolepsy, and hypertension.  Narcolepsy therapy includes methylphenidate  ER plus methylphenidate  TID. Her blood pressure has worsen since with methylphenidate therapy and remains challenging to control.  During most recent  OV with DR Hilty her bystolic dose was increased from  daily (Monday to Friday) to 7.5mg  daily (M-F). Patient denies dizziness, swelling , chest pain or shortness of breath. Repost slight increase in fatigue but nothing significant enough to bother her daily activities.   Current HTN meds:  Bystolic 7.5mg  daily (Monday - Friday) - only days she takes methylphenidate Bystolic  Saturday and Sunday  Previously tried:  Amlodipine - dizziness, fatigue HCTZ - increased fatigue Carvedilol - increased fatigue  BP goal: 130/80  Family History: hypertension, cancer and asthma form mother; hypertension and DM from grandmother  Social History: denies tobacco use, alcohol use or illicit drugs  Diet: low sodium diet  Home BP readings: none provided today  Wt Readings from Last 3 Encounters:  01/15/17 167 lb 9.6 oz (76 kg)  01/27/16 168 lb 12.8 oz (76.6 kg)  09/05/15 161 lb 8 oz (73.3 kg)   BP Readings from Last 3 Encounters:  01/27/17 (!) 142/90  01/15/17 (!) 148/100  01/27/16 120/86   Pulse Readings from Last 3 Encounters:  01/27/17 62  01/15/17 60  01/27/16 77     Past Medical History:  Diagnosis Date  . Chronic insomnia   . History of migraine headaches   . Narcolepsy without cataplexy(347.00)     Current Outpatient Prescriptions on File Prior to Visit  Medication Sig Dispense Refill  . atorvastatin (LIPITOR) 20 MG tablet Take 20 mg by mouth daily.    Marland Kitchen BYSTOLIC 5 MG tablet Take 7.5mg  (1.5 tablets) by mouth Mon-Fri and   all other days. 45 tablet 11  . cetirizine (ZYRTEC) 10 MG tablet Take 10 mg by mouth daily.      . diphenhydrAMINE (SOMINEX) 25 MG tablet Take 25 mg by mouth at bedtime as needed for sleep.    . methylphenidate (METADATE ER) 20 MG ER tablet Twice daily as needed 60 tablet 0  . methylphenidate (RITALIN) 10 MG tablet Take 1 tablet (10 mg total) by mouth 3 (three) times daily. 90 tablet 0  . naproxen (NAPROSYN) 500 MG tablet Take 1 tablet by mouth as directed.  2  . RELPAX 40 MG tablet   1  . triamcinolone (NASACORT ALLERGY 24HR) 55 MCG/ACT AERO nasal inhaler Place 2 sprays into the nose daily.    Marland Kitchen zolpidem (AMBIEN CR) 12.5 MG CR tablet Take 1 tablet (12.5 mg total) by mouth at bedtime as needed. 90 tablet 1   No current facility-administered medications on file prior to visit.     Allergies  Allergen Reactions  . Amlodipine Besylate     Dizziness, disequilibrium, fatigue  . Armodafinil     REACTION: headaches  . Atorvastatin     fatigue  . Etodolac Itching  . Hydrochlorothiazide     Fatigue/ laziness  . Vicodin [Hydrocodone-Acetaminophen] Itching  . Zolpidem Tartrate     REACTION: forgettfulness    Blood pressure Marland Kitchen)  142/90, pulse 62, SpO2 99 %.  Essential hypertension Blood pressure remains elevated above goal of 130/80. No records from home readings were provided today and I am unable to assess BP control at home. Patient bet-blocker dose was adjusted only 10 days ago and patient may need some time to allow tolerability for higher beta-blocker dose. She has narcolepsy and increase dose of Bystolic may worsen symptoms of somnolence/fatigue. Will continue current Bystolic dose at 7.5mg  daily M-F and 5mg  Sat & Sun. Patient instructed to monitor BP twice daily for 1 months and return to clinic for follow up. Plan to initiate ACEi or ARB if additional BP control needed.   Denise Herrera PharmD, BCPS, CPP [facility] [address] [place]  [phone] 01/28/2017 2:53 PM

## 2017-01-28 NOTE — Assessment & Plan Note (Signed)
Blood pressure remains elevated above goal of 130/80. No records from home readings were provided today and I am unable to assess BP control at home. Patient bet-blocker dose was adjusted only 10 days ago and patient may need some time to allow tolerability for higher beta-blocker dose. She has narcolepsy and increase dose of Bystolic may worsen symptoms of somnolence/fatigue. Will continue current Bystolic dose at 7.5mg  daily M-F and 5mg  Sat & Sun. Patient instructed to monitor BP twice daily for 1 months and return to clinic for follow up. Plan to initiate ACEi or ARB if additional BP control needed.

## 2017-02-08 DIAGNOSIS — Z0000 Encounter for general adult medical examination without abnormal findings: Secondary | ICD-10-CM | POA: Diagnosis not present

## 2017-02-08 DIAGNOSIS — E7800 Pure hypercholesterolemia, unspecified: Secondary | ICD-10-CM | POA: Diagnosis not present

## 2017-02-25 ENCOUNTER — Ambulatory Visit (INDEPENDENT_AMBULATORY_CARE_PROVIDER_SITE_OTHER): Payer: BLUE CROSS/BLUE SHIELD | Admitting: Internal Medicine

## 2017-02-25 ENCOUNTER — Encounter: Payer: Self-pay | Admitting: Internal Medicine

## 2017-02-25 ENCOUNTER — Ambulatory Visit: Payer: BLUE CROSS/BLUE SHIELD

## 2017-02-25 VITALS — BP 130/74 | HR 61 | Ht 64.0 in | Wt 168.4 lb

## 2017-02-25 DIAGNOSIS — G47419 Narcolepsy without cataplexy: Secondary | ICD-10-CM | POA: Diagnosis not present

## 2017-02-25 DIAGNOSIS — G4700 Insomnia, unspecified: Secondary | ICD-10-CM | POA: Diagnosis not present

## 2017-02-25 NOTE — Patient Instructions (Signed)
Since your sleep problems were significant before the current meds, and not as well managed with other therapy, we don't want to change unless your BP doctors are really concerned.  Try taking the ambien 30 minutes earlier at night, so it isn't lingering in your system in the morning.  Please call as needed

## 2017-02-25 NOTE — Progress Notes (Signed)
Subjective:    Patient ID: Denise Herrera, female    DOB: [birth year], 46 y.o.   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 She feels very stable, able to continue working without problems. Skips methylphenidate on weekends. On a typical workday she takes Metadate ER once and Ritalin 10 mg, usually twice. She tries to stretch out her prescription. No concerns about intolerance, side effects or misuse. Uses Ambien most work nights to help sleep. Occasional nap.  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 Pt continues to have issues with wakefulness during the day.  Ritalin 10, metadate ER 20 twice, ambien CR 12.5, Sominex Increasing dose of Bystolic from 5 mg to 7.5 mg daily Was associated with increased sense of fatigue. Continues Ambien CR 12.5 mg Monday through Friday only. Occasional Sominex OTC. Continues Metadate once or twice daily, morning and around 1:30. Ritalin 10 mg twice daily-rarely 3 times daily. This is been very stable for her, and with occasional nap, has been sufficient. There is been no tolerance or concern about misuse. He does not seem to interfere with BP control.  Review of Systems- see HPI  + = positive Constitutional:   No-   weight loss, night sweats, fevers, chills, +fatigue, lassitude. HEENT:   +   headaches, no-difficulty swallowing, tooth/dental problems, sore throat,       No-  sneezing, itching, ear ache, nasal congestion, post nasal drip,  CV:  No-   chest pain, orthopnea, PND, swelling in lower extremities, anasarca, dizziness, palpitations Resp: No-   shortness of breath with exertion or at rest.              No-   productive cough,  No non-productive cough,  No-  coughing up of blood.              No-   change in color of mucus.  No- wheezing.   Skin: No-   rash or lesions.  GI:  No-   heartburn, indigestion, abdominal pain, nausea, vomiting,  GU: . MS:  No-   joint pain or swelling.   Neuro- nothing unusual Psych:  No- change in mood or affect. No depression or anxiety.  No memory loss    Objective:   Physical Exam General- Alert, Oriented, Affect-appropriate, Distress- none acute  Medium build. Appears well Skin- rash-none, lesions- none, excoriation- none; tatoos Lymphadenopathy- none Head- atraumatic            Eyes- Gross vision intact, PERRLA, conjunctivae clear secretions            Ears- Hearing, canals normal            Nose- Clear, NoSeptal dev, mucus, polyps, erosion, perforation             Throat- Mallampati II , mucosa clear , drainage- none, tonsils- atrophic Neck- flexible , trachea midline, no stridor , thyroid nl, carotid no bruit Chest - symmetrical excursion , unlabored           Heart/CV- RRR ,  no murmur , no gallop  , no rub, nl s1 s2                           - JVD- none , edema- none, stasis changes- none, varices- none           Lung- clear to P&A, wheeze- none, cough- none , dullness-none, rub- none           Chest wall-  Abd-  Br/ Gen/ Rectal- Not done, not indicated Extrem- cyanosis- none, clubbing, none, atrophy- none, strength- nl Neuro- grossly intact to observation. No tremor Assessment & Plan:

## 2017-02-27 NOTE — Assessment & Plan Note (Signed)
Insomnia is a common complaint with narcolepsy, representing unstable sleep-wake control around-the-clock. She has been very comfortable with Ambien and uses it only 5 days a week to avoid tolerance. There is been no sleepwalking and no complaints of morning carryover.

## 2017-02-27 NOTE — Assessment & Plan Note (Signed)
She feels very stable and feels medications don't need to be changed. She has only been on increased dose of Bystolic for BP in the last few weeks. She poorly clearly associates the increased dose with her sense of increased fatigue and will discuss that with her primary physician. Perhaps a non-beta blocker could be tried.

## 2017-03-03 NOTE — Progress Notes (Signed)
Patient ID: Denise Herrera                 DOB: 1971-02-28                      MRN: [id number]     HPI: NASHAY Herrera is a 46 y.o. female referred by Dr. Rennis Golden to HTN clinic. PMH includes chronic insomnia, migraines, narcolepsy, and hypertension.  Narcolepsy therapy includes methylphenidate 20mg  ER plus methylphenidate 10mg  TID. Her blood pressure has worsen since with methylphenidate therapy and remains challenging to control.  Dr Rennis Golden her bystolic dose was increased from 5mg  daily (Monday to Friday) to 7.5mg  daily (M-F) on 01/15/2017. During most recent HTN clinic visit not changes were made to therapy and focus was placed on lifestyle modifications and compliance with Bystolic increased dose.  Patient denies dizziness, swelling , chest pain or shortness of breath. Repost significant increase in fatigue and need to take extra Ritalin to accomplish daily tasks.  Current HTN meds:  Bystolic 7.5mg  daily (Monday - Friday) - only days she takes methylphenidate  Bystolic 5mg  Saturday and Sunday  Previously tried:  Amlodipine - dizziness, fatigue HCTZ - increased fatigue Carvedilol - increased fatigue  BP goal: 130/80  Family History: hypertension, cancer and asthma form mother; hypertension and DM from grandmother  Social History: denies tobacco use, alcohol use or illicit drugs  Diet: low sodium diet  Home BP readings: 11 readings; average 160/103 (pulse 56-75 bpm) BP home monitor ([facility]) - not accurate with  Systolic BP >15 mmHg different from manual reading  Wt Readings from Last 3 Encounters:  02/25/17 168 lb 6.4 oz (76.4 kg)  01/15/17 167 lb 9.6 oz (76 kg)  01/27/16 168 lb 12.8 oz (76.6 kg)   BP Readings from Last 3 Encounters:  03/04/17 (!) 138/96  02/25/17 130/74  01/27/17 (!) 142/90   Pulse Readings from Last 3 Encounters:  03/04/17 (!) 48  02/25/17 61  01/27/17 62     Past Medical History:  Diagnosis Date  . Chronic insomnia   . History of migraine headaches    . Narcolepsy without cataplexy(347.00)     Current Outpatient Prescriptions on File Prior to Visit  Medication Sig Dispense Refill  . atorvastatin (LIPITOR) 20 MG tablet Take 20 mg by mouth daily.    . cetirizine (ZYRTEC) 10 MG tablet Take 10 mg by mouth daily.      . diphenhydrAMINE (SOMINEX) 25 MG tablet Take 25 mg by mouth at bedtime as needed for sleep.    . methylphenidate (METADATE ER) 20 MG ER tablet Twice daily as needed 60 tablet 0  . methylphenidate (RITALIN) 10 MG tablet Take 1 tablet (10 mg total) by mouth 3 (three) times daily. 90 tablet 0  . naproxen (NAPROSYN) 500 MG tablet Take 1 tablet by mouth as directed.  2  . RELPAX 40 MG tablet   1  . triamcinolone (NASACORT ALLERGY 24HR) 55 MCG/ACT AERO nasal inhaler Place 2 sprays into the nose daily.    Marland Kitchen zolpidem (AMBIEN CR) 12.5 MG CR tablet Take 1 tablet (12.5 mg total) by mouth at bedtime as needed. 90 tablet 1   No current facility-administered medications on file prior to visit.     Allergies  Allergen Reactions  . Amlodipine Besylate     Dizziness, disequilibrium, fatigue  . Armodafinil     REACTION: headaches  . Atorvastatin     fatigue  . Etodolac Itching  . Hydrochlorothiazide  Fatigue/ laziness  . Vicodin [Hydrocodone-Acetaminophen] Itching  . Zolpidem Tartrate     REACTION: forgettfulness    Blood pressure (!) 138/96, pulse (!) 48.  Essential hypertension Blood pressure remains above goal of 130/80 but better controlled today after 4 weeks on Bystolic 7.5mg  daily. Patient reports severe fatigue affecting daily activities and need to take extra Ritalin to perform daily tasks. Average home BP reported as 160/103 but BP home monitor was determined to be inaccurate with over 15mmHg difference from manual readings. Also noted, HR at 48bpm today and range between 50s and 70s at home than may be affecting energy levels. Will [facility], decrease Bystolic back to  5mg  daily, and initiate valsartan 40mg  every evening. Patient to monitor BP and bring records to f/u visit in 2 weeks. BMET repeat will be done during next visit. Patient refused 30 day supply of valsartan without knowing efficacy and tolerability.   Tien Spooner Rodriguez-Guzman PharmD, BCPS, CPP [facility] [address] [place], [phone] 03/04/2017 8:58 AM

## 2017-03-04 ENCOUNTER — Ambulatory Visit (INDEPENDENT_AMBULATORY_CARE_PROVIDER_SITE_OTHER): Payer: BLUE CROSS/BLUE SHIELD | Admitting: Pharmacist

## 2017-03-04 VITALS — BP 138/96 | HR 48

## 2017-03-04 DIAGNOSIS — I10 Essential (primary) hypertension: Secondary | ICD-10-CM | POA: Diagnosis not present

## 2017-03-04 MED ORDER — BYSTOLIC 5 MG PO TABS: ORAL_TABLET | ORAL | 5 refills | Status: DC

## 2017-03-04 MED ORDER — VALSARTAN 40 MG PO TABS: 40.0000 mg | ORAL_TABLET | Freq: Every day | ORAL | 0 refills | Status: DC

## 2017-03-04 NOTE — Assessment & Plan Note (Addendum)
Blood pressure remains above goal of 130/80 but better controlled today after 4 weeks on Bystolic 7.5mg  daily. Patient reports severe fatigue affecting daily activities and need to take extra Ritalin to perform daily tasks. Average home BP reported as 160/103 but BP home monitor was determined to be inaccurate with over 15mmHg difference from manual readings. Also noted, HR at 48bpm today and range between 50s and 70s at home than may be affecting energy levels. Will obtain recent [facility], decrease Bystolic back to 5mg  daily, and initiate valsartan 40mg  every evening. Patient to monitor BP and bring records to f/u visit in 2 weeks. BMET repeat will be done during next visit. Patient refused 30 day supply of valsartan without knowing efficacy and tolerability.

## 2017-03-04 NOTE — Patient Instructions (Addendum)
Return for a follow up appointment in 2 weeks  Your blood pressure today is 138/96 pulse 48  Check your blood pressure at home daily (if able) and keep record of the readings.  Take your BP meds as follows: **DECREASE Bystolic to 5mg  daily **START Valsartan 40mg  every evening**  Bring all of your meds, your BP cuff and your record of home blood pressures to your next appointment.  Exercise as you're able, try to walk approximately 30 minutes per day.  Keep salt intake to a minimum, especially watch canned and prepared boxed foods.  Eat more fresh fruits and vegetables and fewer canned items.  Avoid eating in fast food restaurants.    HOW TO TAKE YOUR BLOOD PRESSURE: . Rest 5 minutes before taking your blood pressure. .  Don't smoke or drink caffeinated beverages for at least 30 minutes before. . Take your blood pressure before (not after) you eat. . Sit comfortably with your back supported and both feet on the floor (don't cross your legs). . Elevate your arm to heart level on a table or a desk. . Use the proper sized cuff. It should fit smoothly and snugly around your bare upper arm. There should be enough room to slip a fingertip under the cuff. The bottom edge of the cuff should be 1 inch above the crease of the elbow. . Ideally, take 3 measurements at one sitting and record the average.

## 2017-03-10 DIAGNOSIS — J069 Acute upper respiratory infection, unspecified: Secondary | ICD-10-CM | POA: Diagnosis not present

## 2017-03-17 NOTE — Progress Notes (Signed)
Patient ID: Denise Herrera                 DOB: [birth year]                      MRN: [id number]     HPI: Denise Herrera is a 46 y.o. female referred by Dr. Rennis Golden to HTN clinic. PMH includes chronic insomnia, migraines, narcolepsy, and hypertension.  Narcolepsy therapy includes methylphenidate 20mg  ER plus methylphenidate 10mg  TID. Her blood pressure has worsen since with methylphenidate therapy and remains challenging to control.  During most recent HTN clinic visit Bystolic was decreased to 5mg  daily due to increased fatigue, decreased HR, and increased sedation. Valsartan 40mg  every evening was added to therapy. Patient denies dizziness, swelling , chest pain or shortness of breath.  Patient presents for HTN follow up. Denies swelling, chest pain or shortness of breath. Reports stopping valsartan 40mg  1 week ago due to severe sedation, and inability to drive.    Current HTN meds:  Bystolic 5mg  daily  Valsartan 40mg  daily (every night) - used for only 1 week  Previously tried:  Amlodipine - dizziness, fatigue HCTZ - increased fatigue Carvedilol - increased fatigue Valsartan - severe lightheaded/unable to drive  BP goal: 409/81  Family History: hypertension, cancer and asthma form mother; hypertension and DM from grandmother  Social History: denies tobacco use, alcohol use or illicit drugs  Diet: low sodium diet but not familiar with DASH diet  Home BP readings: none available today. BP home monitor ([facility]) - not accurate with  Systolic BP >15 mmHg different from manual reading  Wt Readings from Last 3 Encounters:  02/25/17 168 lb 6.4 oz (76.4 kg)  01/15/17 167 lb 9.6 oz (76 kg)  01/27/16 168 lb 12.8 oz (76.6 kg)   BP Readings from Last 3 Encounters:  03/18/17 (!) 136/98  03/04/17 (!) 138/96  02/25/17 130/74   Pulse Readings from Last 3 Encounters:  03/18/17 70  03/04/17 (!) 48  02/25/17 61    Past Medical History:  Diagnosis Date  . Chronic insomnia   . History of  migraine headaches   . Narcolepsy without cataplexy(347.00)     Current Outpatient Prescriptions on File Prior to Visit  Medication Sig Dispense Refill  . atorvastatin (LIPITOR) 20 MG tablet Take 20 mg by mouth daily.    Marland Kitchen BYSTOLIC 5 MG tablet Take 1 tablet (5mg ) daily 30 tablet 5  . cetirizine (ZYRTEC) 10 MG tablet Take 10 mg by mouth daily.      . diphenhydrAMINE (SOMINEX) 25 MG tablet Take 25 mg by mouth at bedtime as needed for sleep.    . methylphenidate (METADATE ER) 20 MG ER tablet Twice daily as needed 60 tablet 0  . methylphenidate (RITALIN) 10 MG tablet Take 1 tablet (10 mg total) by mouth 3 (three) times daily. 90 tablet 0  . naproxen (NAPROSYN) 500 MG tablet Take 1 tablet by mouth as directed.  2  . RELPAX 40 MG tablet   1  . triamcinolone (NASACORT ALLERGY 24HR) 55 MCG/ACT AERO nasal inhaler Place 2 sprays into the nose daily.    Marland Kitchen zolpidem (AMBIEN CR) 12.5 MG CR tablet Take 1 tablet (12.5 mg total) by mouth at bedtime as needed. 90 tablet 1   No current facility-administered medications on file prior to visit.     Allergies  Allergen Reactions  . Amlodipine Besylate     Dizziness, disequilibrium, fatigue  . Armodafinil  REACTION: headaches  . Atorvastatin     fatigue  . Etodolac Itching  . Hydrochlorothiazide     Fatigue/ laziness  . Vicodin [Hydrocodone-Acetaminophen] Itching  . Zolpidem Tartrate     REACTION: forgettfulness    Blood pressure (!) 136/98, pulse 70, SpO2 98 %.  Essential hypertension Blood pressure remains above goal during office visit. Noted patient BP was 130/74 at pulmonologist f/u while taking valsartan, but she stopped taking medication due to ADR. Risk for heart failure and kidney injury with chronically elevated BP was discussed with patient.  Therapy options discussed with patient and DR Hilty; but she continues to refuse addition of spironolactone or any other medication today. Will initiate DASH diet, increase hydration, and  incorporate nuts and fresh fruits to daily intake. Patient is to get new BP home monitor. Instructed to monitor BP twice daily and bring records to next f/u in 8 weeks. Plan to initiate spironolactone at 12.5mg  every other day during next office visit if additional BP control needed.   Aayat Hajjar Rodriguez-Guzman PharmD, BCPS, CPP [facility] [address] [place] [phone] 03/18/2017 9:08 AM

## 2017-03-18 ENCOUNTER — Ambulatory Visit (INDEPENDENT_AMBULATORY_CARE_PROVIDER_SITE_OTHER): Payer: BLUE CROSS/BLUE SHIELD | Admitting: Pharmacist

## 2017-03-18 VITALS — BP 136/98 | HR 70

## 2017-03-18 DIAGNOSIS — I10 Essential (primary) hypertension: Secondary | ICD-10-CM

## 2017-03-18 NOTE — Assessment & Plan Note (Signed)
Blood pressure remains above goal during office visit. Noted patient BP was 130/74 at pulmonologist f/u while taking valsartan, but she stopped taking medication due to ADR. Risk for heart failure and kidney injury with chronically elevated BP was discussed with patient.  Therapy options discussed with patient and DR Hilty; but she continues to refuse addition of spironolactone or any other medication today. Will initiate DASH diet, increase hydration, and incorporate nuts and fresh fruits to daily intake. Patient is to get new BP home monitor. Instructed to monitor BP twice daily and bring records to next f/u in 8 weeks. Plan to initiate spironolactone at 12.5mg  every other day during next office visit if additional BP control needed.

## 2017-03-18 NOTE — Patient Instructions (Addendum)
Return for a  follow up appointment in 8 weeks   Your blood pressure today is 136/98 pulse 70  Check your blood pressure at home daily (if able) and keep record of the readings.  Take your BP meds as follows: *STOP valsartan* *Continue all other medication as previously prescribed*   DASH Eating Plan DASH stands for "Dietary Approaches to Stop Hypertension." The DASH eating plan is a healthy eating plan that has been shown to reduce high blood pressure (hypertension). It may also reduce your risk for type 2 diabetes, heart disease, and stroke. The DASH eating plan may also help with weight loss. What are tips for following this plan? General guidelines  Avoid eating more than 2,300 mg (milligrams) of salt (sodium) a day. If you have hypertension, you may need to reduce your sodium intake to 1,500 mg a day.  Limit alcohol intake to no more than 1 drink a day for nonpregnant women and 2 drinks a day for men. One drink equals 12 oz of beer, 5 oz of wine, or 1 oz of hard liquor.  Work with your health care provider to maintain a healthy body weight or to lose weight. Ask what an ideal weight is for you.  Get at least 30 minutes of exercise that causes your heart to beat faster (aerobic exercise) most days of the week. Activities may include walking, swimming, or biking.  Work with your health care provider or diet and nutrition specialist (dietitian) to adjust your eating plan to your individual calorie needs. Reading food labels  Check food labels for the amount of sodium per serving. Choose foods with less than 5 percent of the Daily Value of sodium. Generally, foods with less than 300 mg of sodium per serving fit into this eating plan.  To find whole grains, look for the word "whole" as the first word in the ingredient list. Shopping  Buy products labeled as "low-sodium" or "no salt added."  Buy fresh foods. Avoid canned foods and premade or frozen meals. Cooking  Avoid adding  salt when cooking. Use salt-free seasonings or herbs instead of table salt or sea salt. Check with your health care provider or pharmacist before using salt substitutes.  Do not fry foods. Cook foods using healthy methods such as baking, boiling, grilling, and broiling instead.  Cook with heart-healthy oils, such as olive, canola, soybean, or sunflower oil. Meal planning   Eat a balanced diet that includes: ? 5 or more servings of fruits and vegetables each day. At each meal, try to fill half of your plate with fruits and vegetables. ? Up to 6-8 servings of whole grains each day. ? Less than 6 oz of lean meat, poultry, or fish each day. A 3-oz serving of meat is about the same size as a deck of cards. One egg equals 1 oz. ? 2 servings of low-fat dairy each day. ? A serving of nuts, seeds, or beans 5 times each week. ? Heart-healthy fats. Healthy fats called Omega-3 fatty acids are found in foods such as flaxseeds and coldwater fish, like sardines, salmon, and mackerel.  Limit how much you eat of the following: ? Canned or prepackaged foods. ? Food that is high in trans fat, such as fried foods. ? Food that is high in saturated fat, such as fatty meat. ? Sweets, desserts, sugary drinks, and other foods with added sugar. ? Full-fat dairy products.  Do not salt foods before eating.  Try to eat at least 2 vegetarian  meals each week.  Eat more home-cooked food and less restaurant, buffet, and fast food.  When eating at a restaurant, ask that your food be prepared with less salt or no salt, if possible. What foods are recommended? The items listed may not be a complete list. Talk with your dietitian about what dietary choices are best for you. Grains Whole-grain or whole-wheat bread. Whole-grain or whole-wheat pasta. Brown rice. [place]row. Bulgur. Whole-grain and low-sodium cereals. Pita bread. Low-fat, low-sodium crackers. Whole-wheat flour tortillas. Vegetables Fresh or frozen  vegetables (raw, steamed, roasted, or grilled). Low-sodium or reduced-sodium tomato and vegetable juice. Low-sodium or reduced-sodium tomato sauce and tomato paste. Low-sodium or reduced-sodium canned vegetables. Fruits All fresh, dried, or frozen fruit. Canned fruit in natural juice (without added sugar). Meat and other protein foods Skinless chicken or [place]. Ground chicken or [place]. Pork with fat trimmed off. Fish and seafood. Egg whites. Dried beans, peas, or lentils. Unsalted nuts, nut butters, and seeds. Unsalted canned beans. Lean cuts of beef with fat trimmed off. Low-sodium, lean deli meat. Dairy Low-fat (1%) or fat-free (skim) milk. Fat-free, low-fat, or reduced-fat cheeses. Nonfat, low-sodium ricotta or cottage cheese. Low-fat or nonfat yogurt. Low-fat, low-sodium cheese. Fats and oils Soft margarine without trans fats. Vegetable oil. Low-fat, reduced-fat, or light mayonnaise and salad dressings (reduced-sodium). Canola, safflower, olive, soybean, and sunflower oils. Avocado. Seasoning and other foods Herbs. Spices. Seasoning mixes without salt. Unsalted popcorn and pretzels. Fat-free sweets. What foods are not recommended? The items listed may not be a complete list. Talk with your dietitian about what dietary choices are best for you. Grains Baked goods made with fat, such as croissants, muffins, or some breads. Dry pasta or rice meal packs. Vegetables Creamed or fried vegetables. Vegetables in a cheese sauce. Regular canned vegetables (not low-sodium or reduced-sodium). Regular canned tomato sauce and paste (not low-sodium or reduced-sodium). Regular tomato and vegetable juice (not low-sodium or reduced-sodium). Angie Fava. Olives. Fruits Canned fruit in a light or heavy syrup. Fried fruit. Fruit in cream or butter sauce. Meat and other protein foods Fatty cuts of meat. Ribs. Fried meat. Berniece Salines. Sausage. Bologna and other processed lunch meats. Salami. Fatback. Hotdogs. Bratwurst.  Salted nuts and seeds. Canned beans with added salt. Canned or smoked fish. Whole eggs or egg yolks. Chicken or [place] with skin. Dairy Whole or 2% milk, cream, and half-and-half. Whole or full-fat cream cheese. Whole-fat or sweetened yogurt. Full-fat cheese. Nondairy creamers. Whipped toppings. Processed cheese and cheese spreads. Fats and oils Butter. Stick margarine. Lard. Shortening. Ghee. Bacon fat. Tropical oils, such as coconut, palm kernel, or palm oil. Seasoning and other foods Salted popcorn and pretzels. Onion salt, garlic salt, seasoned salt, table salt, and sea salt. Worcestershire sauce. Tartar sauce. Barbecue sauce. Teriyaki sauce. Soy sauce, including reduced-sodium. Steak sauce. Canned and packaged gravies. Fish sauce. Oyster sauce. Cocktail sauce. Horseradish that you find on the shelf. Ketchup. Mustard. Meat flavorings and tenderizers. Bouillon cubes. Hot sauce and Tabasco sauce. Premade or packaged marinades. Premade or packaged taco seasonings. Relishes. Regular salad dressings. Where to find more information:  National Heart, Lung, and [place]: https://wilson-eaton.com/  American Heart Association: www.heart.org Summary  The DASH eating plan is a healthy eating plan that has been shown to reduce high blood pressure (hypertension). It may also reduce your risk for type 2 diabetes, heart disease, and stroke.  With the DASH eating plan, you should limit salt (sodium) intake to 2,300 mg a day. If you have hypertension, you may need to  reduce your sodium intake to 1,500 mg a day.  When on the DASH eating plan, aim to eat more fresh fruits and vegetables, whole grains, lean proteins, low-fat dairy, and heart-healthy fats.  Work with your health care provider or diet and nutrition specialist (dietitian) to adjust your eating plan to your individual calorie needs. This information is not intended to replace advice given to you by your health care provider. Make sure you discuss any  questions you have with your health care provider. Document Released: 04/23/2011 Document Revised: 04/27/2016 Document Reviewed: 04/27/2016 Elsevier Interactive Patient Education  2017 [employer]   Bring all of your meds, your BP cuff and your record of home blood pressures to your next appointment.  Exercise as you're able, try to walk approximately 30 minutes per day.  Keep salt intake to a minimum, especially watch canned and prepared boxed foods.  Eat more fresh fruits and vegetables and fewer canned items.  Avoid eating in fast food restaurants.      HOW TO TAKE YOUR BLOOD PRESSURE: . Rest 5 minutes before taking your blood pressure. .  Don't smoke or drink caffeinated beverages for at least 30 minutes before. . Take your blood pressure before (not after) you eat. . Sit comfortably with your back supported and both feet on the floor (don't cross your legs). . Elevate your arm to heart level on a table or a desk. . Use the proper sized cuff. It should fit smoothly and snugly around your bare upper arm. There should be enough room to slip a fingertip under the cuff. The bottom edge of the cuff should be 1 inch above the crease of the elbow. . Ideally, take 3 measurements at one sitting and record the average.

## 2017-03-23 ENCOUNTER — Telehealth: Payer: Self-pay | Admitting: Internal Medicine

## 2017-03-23 NOTE — Telephone Encounter (Signed)
lmtcb x1 for pt. 

## 2017-03-24 MED ORDER — METHYLPHENIDATE HCL ER 20 MG PO TBCR: EXTENDED_RELEASE_TABLET | ORAL | 0 refills | Status: DC

## 2017-03-24 MED ORDER — METHYLPHENIDATE HCL 10 MG PO TABS: 10.0000 mg | ORAL_TABLET | Freq: Three times a day (TID) | ORAL | 0 refills | Status: DC

## 2017-03-24 NOTE — Telephone Encounter (Signed)
Ok to refill both?? 

## 2017-03-24 NOTE — Telephone Encounter (Signed)
Called and spoke with pt and she stated that she will need the ritalin er 20 mg  And the ritalin 10 mg  rx printed out today so she may come and pick these up.  She will be out today.  CY please advise if you are ok with these rx being printed out.   Last refilled 01/22/17  Last ov--02/25/17

## 2017-03-24 NOTE — Telephone Encounter (Signed)
RXs have been printed and placed on CY's cart for signature.   Patient is aware. Stated that she would pick up the RXs this afternoon.

## 2017-06-03 ENCOUNTER — Telehealth: Payer: Self-pay | Admitting: Internal Medicine

## 2017-06-03 MED ORDER — METHYLPHENIDATE HCL 10 MG PO TABS: 10.0000 mg | ORAL_TABLET | Freq: Three times a day (TID) | ORAL | 0 refills | Status: DC

## 2017-06-03 NOTE — Telephone Encounter (Signed)
Called pt who states she is needing a new Rx of her Ritalin 10mg  tablet. Med last filled 03/24/17, #90 tabs with 0 RF. Pt last seen at office 02/25/17.  Dr. Maple HudsonYoung, please advise if you are okay with us refilling this med for pt.  Thanks!  Allergies  Allergen Reactions  . Amlodipine Besylate     Dizziness, disequilibrium, fatigue  . Armodafinil     REACTION: headaches  . Atorvastatin     fatigue  . Etodolac Itching  . Hydrochlorothiazide     Fatigue/ laziness  . Vicodin [Hydrocodone-Acetaminophen] Itching  . Zolpidem Tartrate     REACTION: forgettfulness     Current Outpatient Medications:  .  atorvastatin (LIPITOR) 20 MG tablet, Take 20 mg by mouth daily., Disp: , Rfl:  .  BYSTOLIC 5 MG tablet, Take 1 tablet (5mg ) daily, Disp: 30 tablet, Rfl: 5 .  cetirizine (ZYRTEC) 10 MG tablet, Take 10 mg by mouth daily.  , Disp: , Rfl:  .  diphenhydrAMINE (SOMINEX) 25 MG tablet, Take 25 mg by mouth at bedtime as needed for sleep., Disp: , Rfl:  .  methylphenidate (METADATE ER) 20 MG ER tablet, Twice daily as needed, Disp: 60 tablet, Rfl: 0 .  methylphenidate (RITALIN) 10 MG tablet, Take 1 tablet (10 mg total) 3 (three) times daily by mouth., Disp: 90 tablet, Rfl: 0 .  naproxen (NAPROSYN) 500 MG tablet, Take 1 tablet by mouth as directed., Disp: , Rfl: 2 .  RELPAX 40 MG tablet, , Disp: , Rfl: 1 .  triamcinolone (NASACORT ALLERGY 24HR) 55 MCG/ACT AERO nasal inhaler, Place 2 sprays into the nose daily., Disp: , Rfl:  .  zolpidem (AMBIEN CR) 12.5 MG CR tablet, Take 1 tablet (12.5 mg total) by mouth at bedtime as needed., Disp: 90 tablet, Rfl: 1

## 2017-06-03 NOTE — Telephone Encounter (Signed)
Rx printed and awaiting signature. Advised pt Rx ready to pick up. Nothing further is needed. .Marland Kitchen

## 2017-06-03 NOTE — Telephone Encounter (Signed)
Ok to refill 

## 2017-06-09 ENCOUNTER — Telehealth: Payer: Self-pay | Admitting: Internal Medicine

## 2017-06-09 NOTE — Telephone Encounter (Signed)
Called pt to verify she was needing an Rx of 20mg  tablet along with the 10mg  tablet she picked up.  Pt verified that that was correct due to her taking both the 10mg  tablet of ritalin and also  20mg  tablet of ritalin.  Dr. Maple HudsonYoung, please advise if we can do a script of ritalin 20mg  tablet.    Allergies  Allergen Reactions  . Amlodipine Besylate     Dizziness, disequilibrium, fatigue  . Armodafinil     REACTION: headaches  . Atorvastatin     fatigue  . Etodolac Itching  . Hydrochlorothiazide     Fatigue/ laziness  . Vicodin [Hydrocodone-Acetaminophen] Itching  . Zolpidem Tartrate     REACTION: forgettfulness     Current Outpatient Medications:  .  atorvastatin (LIPITOR) 20 MG tablet, Take 20 mg by mouth daily., Disp: , Rfl:  .  BYSTOLIC 5 MG tablet, Take 1 tablet (5mg ) daily, Disp: 30 tablet, Rfl: 5 .  cetirizine (ZYRTEC) 10 MG tablet, Take 10 mg by mouth daily.  , Disp: , Rfl:  .  diphenhydrAMINE (SOMINEX) 25 MG tablet, Take 25 mg by mouth at bedtime as needed for sleep., Disp: , Rfl:  .  methylphenidate (METADATE ER) 20 MG ER tablet, Twice daily as needed, Disp: 60 tablet, Rfl: 0 .  methylphenidate (RITALIN) 10 MG tablet, Take 1 tablet (10 mg total) by mouth 3 (three) times daily., Disp: 90 tablet, Rfl: 0 .  naproxen (NAPROSYN) 500 MG tablet, Take 1 tablet by mouth as directed., Disp: , Rfl: 2 .  RELPAX 40 MG tablet, , Disp: , Rfl: 1 .  triamcinolone (NASACORT ALLERGY 24HR) 55 MCG/ACT AERO nasal inhaler, Place 2 sprays into the nose daily., Disp: , Rfl:  .  zolpidem (AMBIEN CR) 12.5 MG CR tablet, Take 1 tablet (12.5 mg total) by mouth at bedtime as needed., Disp: 90 tablet, Rfl: 1

## 2017-06-10 MED ORDER — METHYLPHENIDATE HCL ER 20 MG PO TBCR: EXTENDED_RELEASE_TABLET | ORAL | 0 refills | Status: DC

## 2017-06-10 NOTE — Telephone Encounter (Signed)
Ok to refill 

## 2017-06-10 NOTE — Telephone Encounter (Signed)
rx has been signed by CY and placed in the mail to the pt.  Pt is aware.

## 2017-06-10 NOTE — Telephone Encounter (Signed)
rx has been printed out and placed on CY cart to be signed.  Will mail to the pt once returned rx has been sent to triage.

## 2017-08-16 ENCOUNTER — Telehealth: Payer: Self-pay | Admitting: Internal Medicine

## 2017-08-16 MED ORDER — METHYLPHENIDATE HCL ER 20 MG PO TBCR: EXTENDED_RELEASE_TABLET | ORAL | 0 refills | Status: DC

## 2017-08-16 MED ORDER — METHYLPHENIDATE HCL 10 MG PO TABS: 10.0000 mg | ORAL_TABLET | Freq: Three times a day (TID) | ORAL | 0 refills | Status: DC

## 2017-08-16 NOTE — Telephone Encounter (Signed)
Ok to refill 

## 2017-08-16 NOTE — Telephone Encounter (Signed)
Called and spoke with patient, she is requesting a refill of Ritalin. Patient was last seen in the office by CY on 02/25/17 with a recent refill on 06/03/17 patient was prescribed 90 tablets with zero refills. Patient takes the medication 3 times daily. Per last OV by CY she was to call as needed.   CY please advise are you ok with refilling this medication, thanks.   ------------  Current Outpatient Medications on File Prior to Visit  Medication Sig Dispense Refill  . atorvastatin (LIPITOR) 20 MG tablet Take 20 mg by mouth daily.    Marland Kitchen. BYSTOLIC 5 MG tablet Take 1 tablet (5mg ) daily 30 tablet 5  . cetirizine (ZYRTEC) 10 MG tablet Take 10 mg by mouth daily.      . diphenhydrAMINE (SOMINEX) 25 MG tablet Take 25 mg by mouth at bedtime as needed for sleep.    . methylphenidate (METADATE ER) 20 MG ER tablet Twice daily as needed 60 tablet 0  . methylphenidate (RITALIN) 10 MG tablet Take 1 tablet (10 mg total) by mouth 3 (three) times daily. 90 tablet 0  . naproxen (NAPROSYN) 500 MG tablet Take 1 tablet by mouth as directed.  2  . RELPAX 40 MG tablet   1  . triamcinolone (NASACORT ALLERGY 24HR) 55 MCG/ACT AERO nasal inhaler Place 2 sprays into the nose daily.    Marland Kitchen. zolpidem (AMBIEN CR) 12.5 MG CR tablet Take 1 tablet (12.5 mg total) by mouth at bedtime as needed. 90 tablet 1   No current facility-administered medications on file prior to visit.     Allergies  Allergen Reactions  . Amlodipine Besylate     Dizziness, disequilibrium, fatigue  . Armodafinil     REACTION: headaches  . Atorvastatin     fatigue  . Etodolac Itching  . Hydrochlorothiazide     Fatigue/ laziness  . Vicodin [Hydrocodone-Acetaminophen] Itching  . Zolpidem Tartrate     REACTION: forgettfulness

## 2017-08-16 NOTE — Telephone Encounter (Signed)
RX has been printed and signed by CY. Patient is aware that the RX is up front, ready for pickup.   Nothing else needed at time of call.

## 2017-09-02 DIAGNOSIS — J309 Allergic rhinitis, unspecified: Secondary | ICD-10-CM | POA: Diagnosis not present

## 2017-09-02 DIAGNOSIS — M4692 Unspecified inflammatory spondylopathy, cervical region: Secondary | ICD-10-CM | POA: Diagnosis not present

## 2017-09-02 DIAGNOSIS — E7800 Pure hypercholesterolemia, unspecified: Secondary | ICD-10-CM | POA: Diagnosis not present

## 2017-09-02 DIAGNOSIS — G43909 Migraine, unspecified, not intractable, without status migrainosus: Secondary | ICD-10-CM | POA: Diagnosis not present

## 2017-10-18 ENCOUNTER — Telehealth: Payer: Self-pay | Admitting: Internal Medicine

## 2017-10-18 MED ORDER — METHYLPHENIDATE HCL 10 MG PO TABS: 10.0000 mg | ORAL_TABLET | Freq: Three times a day (TID) | ORAL | 0 refills | Status: DC

## 2017-10-18 MED ORDER — METHYLPHENIDATE HCL ER 20 MG PO TBCR: EXTENDED_RELEASE_TABLET | ORAL | 0 refills | Status: DC

## 2017-10-18 NOTE — Telephone Encounter (Signed)
Ok to refill both?? 

## 2017-10-18 NOTE — Telephone Encounter (Signed)
Rx's printed and signed. Called pt to advise. Nothing further is needed.

## 2017-10-18 NOTE — Telephone Encounter (Signed)
Rx refill for Ritalin 10 mg and Methadate 20mg . CY please advise.   Last OV 02/25/2017 Last RX 08/16/2017  Current Outpatient Medications on File Prior to Visit  Medication Sig Dispense Refill  . atorvastatin (LIPITOR) 20 MG tablet Take 20 mg by mouth daily.    Marland Kitchen. BYSTOLIC 5 MG tablet Take 1 tablet (5mg ) daily 30 tablet 5  . cetirizine (ZYRTEC) 10 MG tablet Take 10 mg by mouth daily.      . diphenhydrAMINE (SOMINEX) 25 MG tablet Take 25 mg by mouth at bedtime as needed for sleep.    . methylphenidate (METADATE ER) 20 MG ER tablet Twice daily as needed 60 tablet 0  . methylphenidate (RITALIN) 10 MG tablet Take 1 tablet (10 mg total) by mouth 3 (three) times daily. 90 tablet 0  . naproxen (NAPROSYN) 500 MG tablet Take 1 tablet by mouth as directed.  2  . RELPAX 40 MG tablet   1  . triamcinolone (NASACORT ALLERGY 24HR) 55 MCG/ACT AERO nasal inhaler Place 2 sprays into the nose daily.    Marland Kitchen. zolpidem (AMBIEN CR) 12.5 MG CR tablet Take 1 tablet (12.5 mg total) by mouth at bedtime as needed. 90 tablet 1   No current facility-administered medications on file prior to visit.    Allergies  Allergen Reactions  . Amlodipine Besylate     Dizziness, disequilibrium, fatigue  . Armodafinil     REACTION: headaches  . Atorvastatin     fatigue  . Etodolac Itching  . Hydrochlorothiazide     Fatigue/ laziness  . Vicodin [Hydrocodone-Acetaminophen] Itching  . Zolpidem Tartrate     REACTION: forgettfulness

## 2017-10-26 ENCOUNTER — Telehealth: Payer: Self-pay | Admitting: Internal Medicine

## 2017-10-26 MED ORDER — ZOLPIDEM TARTRATE ER 12.5 MG PO TBCR: 12.5000 mg | EXTENDED_RELEASE_TABLET | Freq: Every evening | ORAL | 1 refills | Status: DC | PRN

## 2017-10-26 NOTE — Telephone Encounter (Signed)
Called and spoke with patient. She is requesting a refill of her ambien. Patient was last seen on 10.11.18. This was last refilled on 8.28.18. CY please advise, thank you.   Current Outpatient Medications on File Prior to Visit  Medication Sig Dispense Refill  . atorvastatin (LIPITOR) 20 MG tablet Take 20 mg by mouth daily.    Marland Kitchen. BYSTOLIC 5 MG tablet Take 1 tablet (5mg ) daily 30 tablet 5  . cetirizine (ZYRTEC) 10 MG tablet Take 10 mg by mouth daily.      . diphenhydrAMINE (SOMINEX) 25 MG tablet Take 25 mg by mouth at bedtime as needed for sleep.    . methylphenidate (METADATE ER) 20 MG ER tablet Twice daily as needed 60 tablet 0  . methylphenidate (RITALIN) 10 MG tablet Take 1 tablet (10 mg total) by mouth 3 (three) times daily. 90 tablet 0  . naproxen (NAPROSYN) 500 MG tablet Take 1 tablet by mouth as directed.  2  . RELPAX 40 MG tablet   1  . triamcinolone (NASACORT ALLERGY 24HR) 55 MCG/ACT AERO nasal inhaler Place 2 sprays into the nose daily.    Marland Kitchen. zolpidem (AMBIEN CR) 12.5 MG CR tablet Take 1 tablet (12.5 mg total) by mouth at bedtime as needed. 90 tablet 1   No current facility-administered medications on file prior to visit.    Allergies  Allergen Reactions  . Amlodipine Besylate     Dizziness, disequilibrium, fatigue  . Armodafinil     REACTION: headaches  . Atorvastatin     fatigue  . Etodolac Itching  . Hydrochlorothiazide     Fatigue/ laziness  . Vicodin [Hydrocodone-Acetaminophen] Itching  . Zolpidem Tartrate     REACTION: forgettfulness

## 2017-10-26 NOTE — Telephone Encounter (Signed)
Per CY-okay to refill Ambien x 6 months. Thanks.

## 2017-10-26 NOTE — Telephone Encounter (Signed)
Called and spoke with patient advised her that we have called medication into pharmacy. Nothing further needed.

## 2017-11-01 DIAGNOSIS — Z803 Family history of malignant neoplasm of breast: Secondary | ICD-10-CM | POA: Diagnosis not present

## 2017-11-01 DIAGNOSIS — Z1231 Encounter for screening mammogram for malignant neoplasm of breast: Secondary | ICD-10-CM | POA: Diagnosis not present

## 2017-12-24 ENCOUNTER — Telehealth: Payer: Self-pay | Admitting: Internal Medicine

## 2017-12-24 MED ORDER — METHYLPHENIDATE HCL 10 MG PO TABS: 10.0000 mg | ORAL_TABLET | Freq: Three times a day (TID) | ORAL | 0 refills | Status: DC

## 2017-12-24 MED ORDER — METHYLPHENIDATE HCL ER 20 MG PO TBCR: EXTENDED_RELEASE_TABLET | ORAL | 0 refills | Status: DC

## 2017-12-24 NOTE — Telephone Encounter (Signed)
Called and spoke with pt regarding refill on rx metadate ER 20mg , and Ritalin 10mg  Advised pt that CY is out of the office today, and can sign the rx on Monday 12/27/17 Pt advised she is in need of them first thing in the morning. Placed both scripts for signature with red paper for CY on the right of his cart to be signed. Will f/u with them first thing in the morning Monday.  Routing to [place] to advise

## 2017-12-27 NOTE — Telephone Encounter (Signed)
Called and spoke with pt that rx for methylphenidate ready for p/u today Placed scripts up front in front office Nothing further needed.

## 2017-12-27 NOTE — Telephone Encounter (Signed)
Returned Rx's to triage.

## 2018-01-22 ENCOUNTER — Other Ambulatory Visit: Payer: Self-pay | Admitting: Internal Medicine

## 2018-02-24 ENCOUNTER — Ambulatory Visit: Payer: BLUE CROSS/BLUE SHIELD | Admitting: Internal Medicine

## 2018-02-24 ENCOUNTER — Encounter: Payer: Self-pay | Admitting: Internal Medicine

## 2018-02-24 VITALS — BP 162/110 | HR 65 | Ht 64.0 in | Wt 167.0 lb

## 2018-02-24 DIAGNOSIS — G47419 Narcolepsy without cataplexy: Secondary | ICD-10-CM

## 2018-02-24 DIAGNOSIS — G4700 Insomnia, unspecified: Secondary | ICD-10-CM

## 2018-02-24 MED ORDER — METHYLPHENIDATE HCL ER 20 MG PO TBCR: EXTENDED_RELEASE_TABLET | ORAL | 0 refills | Status: DC

## 2018-02-24 MED ORDER — METHYLPHENIDATE HCL 10 MG PO TABS: 10.0000 mg | ORAL_TABLET | Freq: Three times a day (TID) | ORAL | 0 refills | Status: DC

## 2018-02-24 MED ORDER — ZOLPIDEM TARTRATE ER 12.5 MG PO TBCR: 12.5000 mg | EXTENDED_RELEASE_TABLET | Freq: Every evening | ORAL | 1 refills | Status: DC | PRN

## 2018-02-24 NOTE — Progress Notes (Signed)
Subjective:    Patient ID: Denise Herrera, female    DOB: 07-26-1970, 47 y.o.   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 Pt continues to have issues with wakefulness during the day.  Ritalin 10, metadate ER 20 twice, ambien CR 12.5, Sominex Increasing dose of Bystolic from 5 mg to 7.5 mg daily Was associated with increased sense of fatigue. Continues Ambien CR 12.5 mg Monday through Friday only. Occasional Sominex OTC. Continues Metadate once or twice daily, morning and around 1:30. Ritalin 10 mg twice daily-rarely 3 times daily. This is been very stable for her, and with occasional nap, has been sufficient. There is been no tolerance or concern about misuse. He does not seem to interfere with BP control.  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  Uses Ambien Monday through Friday on work nights to help consolidate sleep.  No sleepwalking or other associated concerns. Uses her 20 mg extended release methylphenidate once or twice daily and the 10 mg tabs 2 daily.  She feels very comfortable with this.  There have been  no concerns about misuse.  Review of Systems- see HPI  + = positive Constitutional:   No-   weight loss, night sweats, fevers, chills, +fatigue, lassitude. HEENT:   +  headaches, no-difficulty swallowing, tooth/dental problems, sore throat,       No-  sneezing, itching, ear ache, nasal congestion, post nasal drip,  CV:  No-   chest pain, orthopnea, PND, swelling in lower extremities, anasarca, dizziness, palpitations Resp: No-   shortness of breath with exertion or at rest.              No-   productive cough,  No non-productive cough,  No-  coughing up of blood.              No-   change in color of mucus.  No- wheezing.   Skin: No-   rash or lesions.  GI:  No-   heartburn, indigestion, abdominal pain, nausea, vomiting,  GU: . MS:  No-   joint pain or swelling.   Neuro- nothing unusual Psych:  No- change in mood or affect. No depression or anxiety.  No memory loss    Objective:   Physical Exam General- Alert, Oriented, Affect-appropriate, Distress- none acute  Medium build. Appears well Skin- rash-none, lesions- none, excoriation- none; tatoos Lymphadenopathy- none Head- atraumatic            Eyes- Gross vision intact, PERRLA, conjunctivae clear secretions            Ears- Hearing, canals normal            Nose- Clear, NoSeptal dev, mucus, polyps, erosion, perforation             Throat- Mallampati II , mucosa clear ,  drainage- none, tonsils- atrophic Neck- flexible , trachea midline, no stridor , thyroid nl, carotid no bruit Chest - symmetrical excursion , unlabored           Heart/CV- RRR , no murmur , no gallop  , no rub, nl s1 s2                           - JVD- none , edema- none, stasis changes- none, varices- none           Lung- clear to P&A, wheeze- none, cough- none , dullness-none, rub- none           Chest wall-  Abd-  Br/ Gen/ Rectal- Not done, not indicated Extrem- cyanosis- none, clubbing, none, atrophy- none, strength- nl Neuro- grossly intact to observation. No  tremor Assessment & Plan:

## 2018-02-24 NOTE — Patient Instructions (Signed)
Scripts printed refilling meds  Ambien removed from allergy list  Please contact your PCP- your blood pressure was too high today at 162/ 110.

## 2018-02-25 NOTE — Assessment & Plan Note (Addendum)
Insomnia is common problem associated with narcolepsy because of unstable sleep-wake control. Plan-okay to continue Ambien

## 2018-02-25 NOTE — Assessment & Plan Note (Signed)
Is been very stable and her medication use and has learned to control this problem with a combination of medication and attention to adequate sleep including naps.  She is careful about driving.

## 2018-05-16 ENCOUNTER — Other Ambulatory Visit: Payer: Self-pay | Admitting: Internal Medicine

## 2018-05-16 MED ORDER — METHYLPHENIDATE HCL 10 MG PO TABS: 10.0000 mg | ORAL_TABLET | Freq: Three times a day (TID) | ORAL | 0 refills | Status: DC

## 2018-05-16 MED ORDER — METHYLPHENIDATE HCL ER 20 MG PO TBCR: EXTENDED_RELEASE_TABLET | ORAL | 0 refills | Status: DC

## 2018-05-16 NOTE — Telephone Encounter (Signed)
Pt last seen 02-24-18; [facility] For Ritalin 10mg  and 20mg  doses. Pt aware we will send electronically. No call back needed once completed by CY.

## 2018-06-02 ENCOUNTER — Encounter: Payer: Self-pay | Admitting: Internal Medicine

## 2018-06-02 ENCOUNTER — Ambulatory Visit: Payer: BLUE CROSS/BLUE SHIELD | Admitting: Internal Medicine

## 2018-06-02 VITALS — BP 134/76 | HR 74 | Ht 64.0 in | Wt 172.8 lb

## 2018-06-02 DIAGNOSIS — I10 Essential (primary) hypertension: Secondary | ICD-10-CM

## 2018-06-02 DIAGNOSIS — G47419 Narcolepsy without cataplexy: Secondary | ICD-10-CM

## 2018-06-02 NOTE — Patient Instructions (Signed)
Medication Instructions:  NO CHANGES   Follow-Up: At [facility], you and your health needs are our priority.  As part of our continuing mission to provide you with exceptional heart care, we have created designated Provider Care Teams.  These Care Teams include your primary Cardiologist (physician) and Advanced Practice Providers (APPs -  Physician Assistants and Nurse Practitioners) who all work together to provide you with the care you need, when you need it. You will need a follow up appointment as needed with Dr. Rennis Golden.  If you need an appointment, you may see Dr. Rennis Golden or one of the following Advanced Practice Providers on your designated Care Team: Azalee Course, [place] . Micah Flesher, PA-C  Any Other Special Instructions Will Be Listed Below (If Applicable).

## 2018-06-02 NOTE — Progress Notes (Signed)
OFFICE NOTE  Chief Complaint:  Follow-up labile hypertension  Primary Care Physician: Tally Joe, MD  HPI:  Denise Herrera is a 48 y.o. female who is coming referred to me by Dr. Tally Joe for evaluation of labile blood pressures. According to the records she's had high blood pressure for which she is asymptomatic. She's been on a number of different hypertensive medications before including amlodipine and hydrochlorothiazide. Both medicines made her feel "drained. She was also on by systolic however the cost is excessive for her. She does have Nurse, learning disability but has never used a co-pay assistance card. In addition she was placed on carvedilol 12.5 mg daily, dose which is higher than the equivalent dose of by systolic. This also caused her to feel somewhat fatigued. Blood pressures were optimal during a number of office visits at 120/78. Her blood pressure today was 136/86. At times it was reported blood pressure could be as high as 180s. This makes me concerned about possible whitecoat hypertension. There is not a long standing history of high blood pressure in the family. She reports her mother's side has some high blood pressure but her father does not and siblings do not. 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 She uses CPAP. The stimulants can raise blood pressure and may explain some increased blood pressures during the day. She says she's had the best control of her blood pressure with by systolic. She denies any chest pain, shortness of breath or anginal symptoms.  01/15/2017  Denise Herrera returns today for follow-up of blood pressure. She reports her in the week her blood pressures are running a little higher. This may be related to her taking Ritalin. On the weekends she does not take the medicine. She is on the Ritalin for narcolepsy. She does use CPAP. She denies any chest pain or worsening shortness of  breath.  06/02/2018  Denise Herrera is seen today for follow-up of labile blood pressures.  She has been on different doses of Bystolic but recently on 5 mg daily.  She was also on valsartan for a short period of time but had low blood pressures and felt unwell.  This was stopped and she is done better.  Blood pressure now is somewhere between 120 and 130 systolic.  She continues on Ritalin during the day and medication to help her sleep at night.  PMHx:  Past Medical History:  Diagnosis Date  . Chronic insomnia   . History of migraine headaches   . Narcolepsy without cataplexy(347.00)     Past Surgical History:  Procedure Laterality Date  . CESAREAN SECTION    . gyn cervical procedure      FAMHx:  Family History  Problem Relation Age of Onset  . Asthma Mother   . Hypertension Mother   . Cancer Mother        breast cancer  . Hypertension Maternal Grandfather   . Diabetes Maternal Grandfather   . Hypertension Maternal Grandmother     SOCHx:   reports that she has never smoked. She has never used smokeless tobacco. She reports that she does not drink alcohol or use drugs.  ALLERGIES:  Allergies  Allergen Reactions  . Amlodipine Besylate     Dizziness, disequilibrium, fatigue  . Armodafinil     REACTION: headaches  . Atorvastatin     fatigue  . Etodolac Itching  . Hydrochlorothiazide     Fatigue/ laziness  . Vicodin [  Hydrocodone-Acetaminophen] Itching    ROS: Pertinent items noted in HPI and remainder of comprehensive ROS otherwise negative.  HOME MEDS: Current Outpatient Medications  Medication Sig Dispense Refill  . atorvastatin (LIPITOR) 20 MG tablet Take 20 mg by mouth daily.    Marland Kitchen BYSTOLIC 5 MG tablet Take 7.5mg  (1.5 tablets) by mouth Mon-Fri and 5mg  all other days. Please F/U schedule appointment. (Patient taking differently: Take 5 mg by mouth daily. Take 1 tablet by mouth daily) 45 tablet 2  . cetirizine (ZYRTEC) 10 MG tablet Take 10 mg by mouth daily.       . cyclobenzaprine (FLEXERIL) 5 MG tablet Take 5 mg by mouth as needed.    . diphenhydrAMINE (SOMINEX) 25 MG tablet Take 25 mg by mouth at bedtime as needed for sleep.    . methylphenidate (METADATE ER) 20 MG ER tablet Twice daily as needed 60 tablet 0  . methylphenidate (RITALIN) 10 MG tablet Take 1 tablet (10 mg total) by mouth 3 (three) times daily. 90 tablet 0  . naproxen (NAPROSYN) 500 MG tablet Take 1 tablet by mouth as directed.  2  . Omega-3 Fatty Acids (FISH OIL CONCENTRATE PO) Take by mouth daily.    . RELPAX 40 MG tablet   1  . triamcinolone (NASACORT ALLERGY 24HR) 55 MCG/ACT AERO nasal inhaler Place 2 sprays into the nose daily.    Marland Kitchen zolpidem (AMBIEN CR) 12.5 MG CR tablet Take 1 tablet (12.5 mg total) by mouth at bedtime as needed. 90 tablet 1   No current facility-administered medications for this visit.     LABS/IMAGING: No results found for this or any previous visit (from the past 48 hour(s)). No results found.  WEIGHTS: Wt Readings from Last 3 Encounters:  06/02/18 172 lb 12.8 oz (78.4 kg)  02/24/18 167 lb (75.8 kg)  02/25/17 168 lb 6.4 oz (76.4 kg)    VITALS: BP 134/76   Pulse 74   Ht 5\' 4"  (1.626 m)   Wt 172 lb 12.8 oz (78.4 kg)   LMP 05/07/2018   BMI 29.66 kg/m   EXAM: General appearance: alert and no distress Neck: no carotid bruit and no JVD Lungs: clear to auscultation bilaterally Heart: regular rate and rhythm, S1, S2 normal, no murmur, click, rub or gallop Abdomen: soft, non-tender; bowel sounds normal; no masses,  no organomegaly Extremities: extremities normal, atraumatic, no cyanosis or edema Pulses: 2+ and symmetric Skin: Skin color, texture, turgor normal. No rashes or lesions Neurologic: Grossly normal Psych: Pleasant  EKG: Normal sinus rhythm 74, possible LAE-personally reviewed  ASSESSMENT: 1. Essential hypertension 2. Narcolepsy on stimulants  PLAN: 1.   Denise Herrera seems to have more stable blood pressure at this point.  She is  on a regimen of Bystolic 5 mg daily.  She uses Ritalin during the week and less on the weekends.  Overall she seems to be managing her narcolepsy and blood pressure is essentially well controlled.  I will defer further management of blood pressure and cholesterol to her PCP.  Follow-up with cardiology as needed.  Thanks for allowing me to participate in her care.  Chrystie Nose, MD, [facility], FACP  [place]  [facility]  Medical Director of the [facility]  Card[facility] Reduction [facility] Diplomate of the American Board of Clinical Lipidology Attending Cardiologist  Direct Dial: [phone]  Fax: [phone]  Website:  [email or web address] Nicely Treena Cosman 06/02/2018, 3:50 PM

## 2018-06-17 ENCOUNTER — Telehealth: Payer: Self-pay | Admitting: Internal Medicine

## 2018-06-17 DIAGNOSIS — I10 Essential (primary) hypertension: Secondary | ICD-10-CM | POA: Diagnosis not present

## 2018-06-17 DIAGNOSIS — Z0000 Encounter for general adult medical examination without abnormal findings: Secondary | ICD-10-CM | POA: Diagnosis not present

## 2018-06-17 DIAGNOSIS — M4692 Unspecified inflammatory spondylopathy, cervical region: Secondary | ICD-10-CM | POA: Diagnosis not present

## 2018-06-17 DIAGNOSIS — G43909 Migraine, unspecified, not intractable, without status migrainosus: Secondary | ICD-10-CM | POA: Diagnosis not present

## 2018-06-17 DIAGNOSIS — E7800 Pure hypercholesterolemia, unspecified: Secondary | ICD-10-CM | POA: Diagnosis not present

## 2018-06-17 NOTE — Telephone Encounter (Signed)
Call made to patient, requesting refill of ambien, made aware CY is gone for the afternoon and this will be addressed Monday.   Last Fill 02/24/18 Ambien 12.5 one tab po at bedtime prn #90  CY please advise once this has been sent. Thanks.

## 2018-06-20 MED ORDER — ZOLPIDEM TARTRATE ER 12.5 MG PO TBCR: 12.5000 mg | EXTENDED_RELEASE_TABLET | Freq: Every evening | ORAL | 1 refills | Status: DC | PRN

## 2018-06-20 NOTE — Telephone Encounter (Signed)
Ambien refill e-sent 

## 2018-06-20 NOTE — Telephone Encounter (Signed)
ambien refilled 

## 2018-06-20 NOTE — Telephone Encounter (Signed)
Patient is aware Rx was sent to pharmacy

## 2018-07-27 ENCOUNTER — Other Ambulatory Visit: Payer: Self-pay | Admitting: Internal Medicine

## 2018-07-27 MED ORDER — METHYLPHENIDATE HCL ER 20 MG PO TBCR: EXTENDED_RELEASE_TABLET | ORAL | 0 refills | Status: DC

## 2018-07-27 MED ORDER — METHYLPHENIDATE HCL 10 MG PO TABS: 10.0000 mg | ORAL_TABLET | Freq: Three times a day (TID) | ORAL | 0 refills | Status: DC

## 2018-07-27 NOTE — Telephone Encounter (Signed)
Spoke with the pt and notified rxs were sent Nothing further needed 

## 2018-07-27 NOTE — Telephone Encounter (Signed)
Requesting Rx for Ritalin 20mg  and Ritalin 10 mg to be [facility] into [facility]  Last OV 02/24/2018 Last refill 05/17/2018  Current Outpatient Medications on File Prior to Visit  Medication Sig Dispense Refill  . atorvastatin (LIPITOR) 20 MG tablet Take 20 mg by mouth daily.    Marland Kitchen BYSTOLIC 5 MG tablet Take 7.5mg  (1.5 tablets) by mouth Mon-Fri and 5mg  all other days. Please F/U schedule appointment. (Patient taking differently: Take 5 mg by mouth daily. Take 1 tablet by mouth daily) 45 tablet 2  . cetirizine (ZYRTEC) 10 MG tablet Take 10 mg by mouth daily.      . cyclobenzaprine (FLEXERIL) 5 MG tablet Take 5 mg by mouth as needed.    . diphenhydrAMINE (SOMINEX) 25 MG tablet Take 25 mg by mouth at bedtime as needed for sleep.    . methylphenidate (METADATE ER) 20 MG ER tablet Twice daily as needed 60 tablet 0  . methylphenidate (RITALIN) 10 MG tablet Take 1 tablet (10 mg total) by mouth 3 (three) times daily. 90 tablet 0  . naproxen (NAPROSYN) 500 MG tablet Take 1 tablet by mouth as directed.  2  . Omega-3 Fatty Acids (FISH OIL CONCENTRATE PO) Take by mouth daily.    . RELPAX 40 MG tablet   1  . triamcinolone (NASACORT ALLERGY 24HR) 55 MCG/ACT AERO nasal inhaler Place 2 sprays into the nose daily.    Marland Kitchen zolpidem (AMBIEN CR) 12.5 MG CR tablet Take 1 tablet (12.5 mg total) by mouth at bedtime as needed. 90 tablet 1   No current facility-administered medications on file prior to visit.    Allergies  Allergen Reactions  . Amlodipine Besylate     Dizziness, disequilibrium, fatigue  . Armodafinil     REACTION: headaches  . Atorvastatin     fatigue  . Etodolac Itching  . Hydrochlorothiazide     Fatigue/ laziness  . Vicodin [Hydrocodone-Acetaminophen] Itching

## 2018-07-27 NOTE — Telephone Encounter (Signed)
Refills e-sent 

## 2018-10-20 ENCOUNTER — Telehealth: Payer: Self-pay | Admitting: Internal Medicine

## 2018-10-20 MED ORDER — METHYLPHENIDATE HCL 10 MG PO TABS: 10.0000 mg | ORAL_TABLET | Freq: Three times a day (TID) | ORAL | 0 refills | Status: DC

## 2018-10-20 MED ORDER — METHYLPHENIDATE HCL ER 20 MG PO TBCR: EXTENDED_RELEASE_TABLET | ORAL | 0 refills | Status: DC

## 2018-10-20 NOTE — Telephone Encounter (Signed)
Pt called requesting a refill of both her Ritalin 10mg  and Ritalin 20mg . Last OV with CY was 02/24/18 and last time meds were filled for her was 07/27/2018.  Dr. Maple Hudson, please advise if you are okay refilling both meds for pt. Pharmacy these [facility] to be [facility] to [facility] [address] Lynford Humphrey!  Allergies  Allergen Reactions  . Amlodipine Besylate     Dizziness, disequilibrium, fatigue  . Armodafinil     REACTION: headaches  . Atorvastatin     fatigue  . Etodolac Itching  . Hydrochlorothiazide     Fatigue/ laziness  . Vicodin [Hydrocodone-Acetaminophen] Itching     Current Outpatient Medications:  .  atorvastatin (LIPITOR) 20 MG tablet, Take 20 mg by mouth daily., Disp: , Rfl:  .  BYSTOLIC 5 MG tablet, Take 7.5mg  (1.5 tablets) by mouth Mon-Fri and 5mg  all other days. Please F/U schedule appointment. (Patient taking differently: Take 5 mg by mouth daily. Take 1 tablet by mouth daily), Disp: 45 tablet, Rfl: 2 .  cetirizine (ZYRTEC) 10 MG tablet, Take 10 mg by mouth daily.  , Disp: , Rfl:  .  cyclobenzaprine (FLEXERIL) 5 MG tablet, Take 5 mg by mouth as needed., Disp: , Rfl:  .  diphenhydrAMINE (SOMINEX) 25 MG tablet, Take 25 mg by mouth at bedtime as needed for sleep., Disp: , Rfl:  .  methylphenidate (METADATE ER) 20 MG ER tablet, Twice daily as needed, Disp: 60 tablet, Rfl: 0 .  methylphenidate (RITALIN) 10 MG tablet, Take 1 tablet (10 mg total) by mouth 3 (three) times daily., Disp: 90 tablet, Rfl: 0 .  naproxen (NAPROSYN) 500 MG tablet, Take 1 tablet by mouth as directed., Disp: , Rfl: 2 .  Omega-3 Fatty Acids (FISH OIL CONCENTRATE PO), Take by mouth daily., Disp: , Rfl:  .  RELPAX 40 MG tablet, , Disp: , Rfl: 1 .  triamcinolone (NASACORT ALLERGY 24HR) 55 MCG/ACT AERO nasal inhaler, Place 2 sprays into the nose daily., Disp: , Rfl:  .  zolpidem (AMBIEN CR) 12.5 MG CR tablet, Take 1 tablet (12.5 mg total) by mouth at bedtime as needed., Disp: 90 tablet, Rfl: 1

## 2018-10-20 NOTE — Telephone Encounter (Signed)
Pt aware.

## 2018-10-20 NOTE — Telephone Encounter (Signed)
Refills e-sent 

## 2018-10-31 DIAGNOSIS — Z20828 Contact with and (suspected) exposure to other viral communicable diseases: Secondary | ICD-10-CM | POA: Diagnosis not present

## 2018-11-07 DIAGNOSIS — Z1231 Encounter for screening mammogram for malignant neoplasm of breast: Secondary | ICD-10-CM | POA: Diagnosis not present

## 2018-11-07 DIAGNOSIS — Z803 Family history of malignant neoplasm of breast: Secondary | ICD-10-CM | POA: Diagnosis not present

## 2018-12-19 DIAGNOSIS — G43909 Migraine, unspecified, not intractable, without status migrainosus: Secondary | ICD-10-CM | POA: Diagnosis not present

## 2018-12-19 DIAGNOSIS — I10 Essential (primary) hypertension: Secondary | ICD-10-CM | POA: Diagnosis not present

## 2018-12-19 DIAGNOSIS — M4692 Unspecified inflammatory spondylopathy, cervical region: Secondary | ICD-10-CM | POA: Diagnosis not present

## 2018-12-19 DIAGNOSIS — E7800 Pure hypercholesterolemia, unspecified: Secondary | ICD-10-CM | POA: Diagnosis not present

## 2018-12-26 ENCOUNTER — Telehealth: Payer: Self-pay | Admitting: Internal Medicine

## 2018-12-26 MED ORDER — METHYLPHENIDATE HCL 10 MG PO TABS: 10.0000 mg | ORAL_TABLET | Freq: Three times a day (TID) | ORAL | 0 refills | Status: DC

## 2018-12-26 MED ORDER — ZOLPIDEM TARTRATE ER 12.5 MG PO TBCR: 12.5000 mg | EXTENDED_RELEASE_TABLET | Freq: Every evening | ORAL | 1 refills | Status: DC | PRN

## 2018-12-26 MED ORDER — METHYLPHENIDATE HCL ER 20 MG PO TBCR: EXTENDED_RELEASE_TABLET | ORAL | 0 refills | Status: DC

## 2018-12-26 NOTE — Telephone Encounter (Signed)
Ambien refill e-sent 

## 2018-12-26 NOTE — Telephone Encounter (Signed)
Pt called requesting to have her methylphenidate 10mg  and her methylphenidate 20mg  refilled. Pt last seen 02/24/18 and both meds last filled for pt 10/20/2018.  Dr. Annamaria Boots, please advise if you are okay refilling meds for pt. Pharmacy [facility] [place]. Pt also needs to have her ambien CR refilled as well.  Allergies  Allergen Reactions  . Amlodipine Besylate     Dizziness, disequilibrium, fatigue  . Armodafinil     REACTION: headaches  . Atorvastatin     fatigue  . Etodolac Itching  . Hydrochlorothiazide     Fatigue/ laziness  . Vicodin [Hydrocodone-Acetaminophen] Itching     Current Outpatient Medications:  .  atorvastatin (LIPITOR) 20 MG tablet, Take 20 mg by mouth daily., Disp: , Rfl:  .  BYSTOLIC 5 MG tablet, Take 7.5mg  (1.5 tablets) by mouth Mon-Fri and 5mg  all other days. Please F/U schedule appointment. (Patient taking differently: Take 5 mg by mouth daily. Take 1 tablet by mouth daily), Disp: 45 tablet, Rfl: 2 .  cetirizine (ZYRTEC) 10 MG tablet, Take 10 mg by mouth daily.  , Disp: , Rfl:  .  cyclobenzaprine (FLEXERIL) 5 MG tablet, Take 5 mg by mouth as needed., Disp: , Rfl:  .  diphenhydrAMINE (SOMINEX) 25 MG tablet, Take 25 mg by mouth at bedtime as needed for sleep., Disp: , Rfl:  .  methylphenidate (METADATE ER) 20 MG ER tablet, Twice daily as needed, Disp: 60 tablet, Rfl: 0 .  methylphenidate (RITALIN) 10 MG tablet, Take 1 tablet (10 mg total) by mouth 3 (three) times daily., Disp: 90 tablet, Rfl: 0 .  naproxen (NAPROSYN) 500 MG tablet, Take 1 tablet by mouth as directed., Disp: , Rfl: 2 .  Omega-3 Fatty Acids (FISH OIL CONCENTRATE PO), Take by mouth daily., Disp: , Rfl:  .  RELPAX 40 MG tablet, , Disp: , Rfl: 1 .  triamcinolone (NASACORT ALLERGY 24HR) 55 MCG/ACT AERO nasal inhaler, Place 2 sprays into the nose daily., Disp: , Rfl:  .  zolpidem (AMBIEN CR) 12.5 MG CR tablet, Take 1 tablet (12.5 mg total) by mouth at bedtime as needed., Disp: 90  tablet, Rfl: 1

## 2018-12-26 NOTE — Telephone Encounter (Signed)
Methylphenidate refills e-sent 

## 2018-12-26 NOTE — Telephone Encounter (Addendum)
Called and spoke w/ pt regarding CY's refill authorizations. Pt states she also needs a refill of her Ambien. CY stated that was fine and I let pt know of this. Pt verbalized understanding with no additional questions.    Routing to [employer] for Ambien refill authorization to [place]. Thank you.

## 2018-12-26 NOTE — Addendum Note (Signed)
Addended by: Baird Lyons D on: 12/26/2018 04:54 PM   Modules accepted: Orders

## 2019-02-27 ENCOUNTER — Encounter: Payer: Self-pay | Admitting: Internal Medicine

## 2019-02-27 ENCOUNTER — Other Ambulatory Visit: Payer: Self-pay

## 2019-02-27 ENCOUNTER — Ambulatory Visit: Payer: BC Managed Care – PPO | Admitting: Internal Medicine

## 2019-02-27 DIAGNOSIS — G47419 Narcolepsy without cataplexy: Secondary | ICD-10-CM

## 2019-02-27 DIAGNOSIS — G4700 Insomnia, unspecified: Secondary | ICD-10-CM | POA: Diagnosis not present

## 2019-02-27 NOTE — Patient Instructions (Signed)
We can continue current meds   - Please call if we can help with anything

## 2019-02-27 NOTE — Assessment & Plan Note (Signed)
Maintains good sleep habits. No tolerance or side-effect concerns with alerting meds.

## 2019-02-27 NOTE — Progress Notes (Signed)
Subjective:    Patient ID: Denise Herrera, female    DOB: 12-26-1970, 48 y.o.   MRN: [id number]  HPI  female never smoker followed for Narcolepsy without cataplexy, chronic insomnia, complicated by history of migraine NPSG and MSLT results consistent with narcolepsy syndrome in paper chart prior to 2009  -------------------------------------------------------------------------------------------   02/24/2018- 48 year old female never smoker followed for Narcolepsy without cataplexy, chronic insomnia, complicated by history of migraine  ------Follows for: Narcolepsy, doing well, needs refills on Ambien and methyphenidate Arrival BP 162/110 left arm, HR 65-discussed with her Ritalin 10 mg 3x daily, methylphenidate ER 20 mg twice daily Ambien CR 12.5 mg She states she is doing "fine".  Uses Ambien Monday through Friday on work nights to help consolidate sleep.  No sleepwalking or other associated concerns. Uses her 20 mg extended release methylphenidate once or twice daily and the 10 mg tabs 2 daily.  She feels very comfortable with this.  There have been no concerns about misuse.  02/27/2019- 48 year old female never smoker followed for Narcolepsy without cataplexy, chronic insomnia, complicated by history of migraine  -----followed for narcolepsy, pt states her symptoms are at baseline Ritalin 10 mg 3x daily, methylphenidate ER 20 mg twice daily Ambien CR 12.5 mg She feels she is doing very well with sleep issues, remaining stable with current meds. Denies concerns. Feels well controlled. Denies other significant medical issues.  Review of Systems- see HPI  + = positive Constitutional:   No-   weight loss, night sweats, fevers, chills, +fatigue, lassitude. HEENT:   +  headaches, no-difficulty swallowing, tooth/dental problems, sore throat,       No-  sneezing, itching, ear ache, nasal congestion, post nasal drip,  CV:  No-   chest pain, orthopnea, PND, swelling in lower extremities,  anasarca, dizziness, palpitations Resp: No-   shortness of breath with exertion or at rest.              No-   productive cough,  No non-productive cough,  No-  coughing up of blood.              No-   change in color of mucus.  No- wheezing.   Skin: No-   rash or lesions.  GI:  No-   heartburn, indigestion, abdominal pain, nausea, vomiting,  GU: . MS:  No-   joint pain or swelling.   Neuro- nothing unusual Psych:  No- change in mood or affect. No depression or anxiety.  No memory loss    Objective:   Physical Exam General- Alert, Oriented, Affect-appropriate, Distress- none acute  Medium build. Appears well Skin- rash-none, lesions- none, excoriation- none; tatoos Lymphadenopathy- none Head- atraumatic            Eyes- Gross vision intact, PERRLA, conjunctivae clear secretions            Ears- Hearing, canals normal            Nose- Clear, NoSeptal dev, mucus, polyps, erosion, perforation             Throat- Mallampati II , mucosa clear , drainage- none, tonsils- atrophic Neck- flexible , trachea midline, no stridor , thyroid nl, carotid no bruit Chest - symmetrical excursion , unlabored           Heart/CV- RRR , no murmur , no gallop  , no rub, nl s1 s2                           -  JVD- none , edema- none, stasis changes- none, varices- none           Lung- clear to P&A, wheeze- none, cough- none , dullness-none, rub- none           Chest wall-  Abd-  Br/ Gen/ Rectal- Not done, not indicated Extrem- cyanosis- none, clubbing, none, atrophy- none, strength- nl Neuro- grossly intact to observation. No tremor Assessment & Plan:

## 2019-02-27 NOTE — Assessment & Plan Note (Signed)
Ambien working well still and well-tolerated without sleep walking or carry-over problems.

## 2019-03-06 DIAGNOSIS — M25511 Pain in right shoulder: Secondary | ICD-10-CM | POA: Diagnosis not present

## 2019-03-06 DIAGNOSIS — M25561 Pain in right knee: Secondary | ICD-10-CM | POA: Diagnosis not present

## 2019-03-07 ENCOUNTER — Telehealth: Payer: Self-pay | Admitting: Internal Medicine

## 2019-03-07 NOTE — Telephone Encounter (Signed)
Called and spoke w/ pt regarding her refill requests of Ambien and Ritalin. Pt last seen 02/27/2019 w/ CY.   - Ritalin 10 mg last filled 12/26/2018 90 tab x0 refill. - Ambien 12.6 mg CR filled 12/26/2018 90 tab x1 refill.   I let pt know her Ambien request may be a bit early, which pt expressed understanding to and states she will contact her pharmacy about because she states she hasn't been receiving her full 90-supply of Ambien.   I let her know I would send the request for Ritalin to CY. Pt verbalized understanding.   Current Outpatient Medications on File Prior to Visit  Medication Sig Dispense Refill  . atorvastatin (LIPITOR) 20 MG tablet Take 20 mg by mouth daily.    Marland Kitchen BYSTOLIC 5 MG tablet Take 7.5mg  (1.5 tablets) by mouth Mon-Fri and 5mg  all other days. Please F/U schedule appointment. (Patient taking differently: Take 5 mg by mouth daily. Take 1 tablet by mouth daily) 45 tablet 2  . cetirizine (ZYRTEC) 10 MG tablet Take 10 mg by mouth daily.      . cyclobenzaprine (FLEXERIL) 5 MG tablet Take 5 mg by mouth as needed.    . methylphenidate (METADATE ER) 20 MG ER tablet Twice daily as needed 60 tablet 0  . methylphenidate (RITALIN) 10 MG tablet Take 1 tablet (10 mg total) by mouth 3 (three) times daily. 90 tablet 0  . naproxen (NAPROSYN) 500 MG tablet Take 1 tablet by mouth as directed.  2  . Omega-3 Fatty Acids (FISH OIL CONCENTRATE PO) Take by mouth daily.    . RELPAX 40 MG tablet   1  . triamcinolone (NASACORT ALLERGY 24HR) 55 MCG/ACT AERO nasal inhaler Place 2 sprays into the nose daily.    Marland Kitchen zolpidem (AMBIEN CR) 12.5 MG CR tablet Take 1 tablet (12.5 mg total) by mouth at bedtime as needed. 90 tablet 1   No current facility-administered medications on file prior to visit.    Allergies  Allergen Reactions  . Amlodipine Besylate     Dizziness, disequilibrium, fatigue  . Armodafinil     REACTION: headaches  . Atorvastatin     fatigue  . Etodolac Itching  . Hydrochlorothiazide      Fatigue/ laziness  . Vicodin [Hydrocodone-Acetaminophen] Itching   CY, please advise on the refill of pt's Ritalin. Thank you.

## 2019-03-08 MED ORDER — CYCLOBENZAPRINE HCL 5 MG PO TABS: 5.0000 mg | ORAL_TABLET | ORAL | 0 refills | Status: DC | PRN

## 2019-03-08 MED ORDER — ZOLPIDEM TARTRATE ER 12.5 MG PO TBCR: 12.5000 mg | EXTENDED_RELEASE_TABLET | Freq: Every evening | ORAL | 1 refills | Status: DC | PRN

## 2019-03-08 MED ORDER — METHYLPHENIDATE HCL ER 20 MG PO TBCR: EXTENDED_RELEASE_TABLET | ORAL | 0 refills | Status: DC

## 2019-03-08 NOTE — Telephone Encounter (Signed)
Refill scripts sent.

## 2019-03-08 NOTE — Telephone Encounter (Signed)
Called and spoke w/ pt regarding CY's message below. Pt verbalized understanding with no additional questions. Nothing further needed at this time.

## 2019-03-10 ENCOUNTER — Telehealth: Payer: Self-pay | Admitting: Internal Medicine

## 2019-03-10 MED ORDER — METHYLPHENIDATE HCL 10 MG PO TABS: 10.0000 mg | ORAL_TABLET | Freq: Three times a day (TID) | ORAL | 0 refills | Status: DC

## 2019-03-10 NOTE — Telephone Encounter (Signed)
Spoke with patient.  Let her know Dr. Annamaria Boots sent in medication She voiced understanding.  Nothing further needed

## 2019-03-10 NOTE — Telephone Encounter (Signed)
Called and spoke with Patient. Patient stated she only received Ritalin 20mg  at pharmacy yesterday. Patient stated she is still needing Ritalin 10mg  sent [facility].  Patient last refill for Ritalin 10mg , take 1 tab three times a day, #90, no refills, 12/26/18.  Allergies  Allergen Reactions  . Amlodipine Besylate     Dizziness, disequilibrium, fatigue  . Armodafinil     REACTION: headaches  . Atorvastatin     fatigue  . Etodolac Itching  . Hydrochlorothiazide     Fatigue/ laziness  . Vicodin [Hydrocodone-Acetaminophen] Itching   Current Outpatient Medications on File Prior to Visit  Medication Sig Dispense Refill  . atorvastatin (LIPITOR) 20 MG tablet Take 20 mg by mouth daily.    Marland Kitchen BYSTOLIC 5 MG tablet Take 7.5mg  (1.5 tablets) by mouth Mon-Fri and 5mg  all other days. Please F/U schedule appointment. (Patient taking differently: Take 5 mg by mouth daily. Take 1 tablet by mouth daily) 45 tablet 2  . cetirizine (ZYRTEC) 10 MG tablet Take 10 mg by mouth daily.      . cyclobenzaprine (FLEXERIL) 5 MG tablet Take 1 tablet (5 mg total) by mouth as needed. 30 tablet 0  . methylphenidate (METADATE ER) 20 MG ER tablet Twice daily as needed 60 tablet 0  . methylphenidate (RITALIN) 10 MG tablet Take 1 tablet (10 mg total) by mouth 3 (three) times daily. 90 tablet 0  . naproxen (NAPROSYN) 500 MG tablet Take 1 tablet by mouth as directed.  2  . Omega-3 Fatty Acids (FISH OIL CONCENTRATE PO) Take by mouth daily.    . RELPAX 40 MG tablet   1  . triamcinolone (NASACORT ALLERGY 24HR) 55 MCG/ACT AERO nasal inhaler Place 2 sprays into the nose daily.    Marland Kitchen zolpidem (AMBIEN CR) 12.5 MG CR tablet Take 1 tablet (12.5 mg total) by mouth at bedtime as needed. 90 tablet 1   No current facility-administered medications on file prior to visit.    Message routed to Dr. Annamaria Boots

## 2019-03-10 NOTE — Telephone Encounter (Signed)
Sorry for the delay- Ritalin 10 mg script e-sent

## 2019-05-05 ENCOUNTER — Telehealth: Payer: Self-pay | Admitting: Internal Medicine

## 2019-05-05 MED ORDER — METHYLPHENIDATE HCL 10 MG PO TABS: 10.0000 mg | ORAL_TABLET | Freq: Three times a day (TID) | ORAL | 0 refills | Status: DC

## 2019-05-05 MED ORDER — METHYLPHENIDATE HCL ER 20 MG PO TBCR: EXTENDED_RELEASE_TABLET | ORAL | 0 refills | Status: DC

## 2019-05-05 NOTE — Telephone Encounter (Signed)
Refills e-sent 

## 2019-05-05 NOTE — Telephone Encounter (Signed)
Called and spoke with pt letting her know that the meds were sent in for her and she verbalized understanding. Nothing further needed.

## 2019-05-05 NOTE — Telephone Encounter (Signed)
Pt requesting refill of Ritalin 10mg  and Metadate 20mg . Last refills: Ritalin 10mg : 03/10/2019 #90 with 0 refills, take 1 tab TID Metadate 20mg :03/08/2019 #60 with 0 refills, take 1 tab BID PRN Last OV: 02/27/2019 Next OV: 02/29/2020  CY please advise on refills- they have been pended in pt's chart.  Thanks!

## 2019-06-06 DIAGNOSIS — M25561 Pain in right knee: Secondary | ICD-10-CM | POA: Diagnosis not present

## 2019-06-06 DIAGNOSIS — M25461 Effusion, right knee: Secondary | ICD-10-CM | POA: Diagnosis not present

## 2019-07-14 ENCOUNTER — Telehealth: Payer: Self-pay | Admitting: Internal Medicine

## 2019-07-14 MED ORDER — METHYLPHENIDATE HCL ER 20 MG PO TBCR: EXTENDED_RELEASE_TABLET | ORAL | 0 refills | Status: DC

## 2019-07-14 MED ORDER — METHYLPHENIDATE HCL 10 MG PO TABS: 10.0000 mg | ORAL_TABLET | Freq: Three times a day (TID) | ORAL | 0 refills | Status: DC

## 2019-07-14 NOTE — Telephone Encounter (Signed)
Called and spoke with pt. Pt needs both her Ritalin 10mg  and 20mg  refilled.   Dr. , please advise on this for pt.  Allergies  Allergen Reactions  . Amlodipine Besylate     Dizziness, disequilibrium, fatigue  . Armodafinil     REACTION: headaches  . Atorvastatin     fatigue  . Etodolac Itching  . Hydrochlorothiazide     Fatigue/ laziness  . Vicodin [Hydrocodone-Acetaminophen] Itching     Current Outpatient Medications:  .  atorvastatin (LIPITOR) 20 MG tablet, Take 20 mg by mouth daily., Disp: , Rfl:  .  BYSTOLIC 5 MG tablet, Take 7.5mg  (1.5 tablets) by mouth Mon-Fri and 5mg  all other days. Please F/U schedule appointment. (Patient taking differently: Take 5 mg by mouth daily. Take 1 tablet by mouth daily), Disp: 45 tablet, Rfl: 2 .  cetirizine (ZYRTEC) 10 MG tablet, Take 10 mg by mouth daily.  , Disp: , Rfl:  .  cyclobenzaprine (FLEXERIL) 5 MG tablet, Take 1 tablet (5 mg total) by mouth as needed., Disp: 30 tablet, Rfl: 0 .  methylphenidate (METADATE ER) 20 MG ER tablet, Twice daily as needed, Disp: 60 tablet, Rfl: 0 .  methylphenidate (RITALIN) 10 MG tablet, Take 1 tablet (10 mg total) by mouth 3 (three) times daily., Disp: 90 tablet, Rfl: 0 .  naproxen (NAPROSYN) 500 MG tablet, Take 1 tablet by mouth as directed., Disp: , Rfl: 2 .  Omega-3 Fatty Acids (FISH OIL CONCENTRATE PO), Take by mouth daily., Disp: , Rfl:  .  RELPAX 40 MG tablet, , Disp: , Rfl: 1 .  triamcinolone (NASACORT ALLERGY 24HR) 55 MCG/ACT AERO nasal inhaler, Place 2 sprays into the nose daily., Disp: , Rfl:  .  zolpidem (AMBIEN CR) 12.5 MG CR tablet, Take 1 tablet (12.5 mg total) by mouth at bedtime as needed., Disp: 90 tablet, Rfl: 1

## 2019-07-14 NOTE — Telephone Encounter (Signed)
Refills were sent to [facility]

## 2019-07-18 DIAGNOSIS — G43909 Migraine, unspecified, not intractable, without status migrainosus: Secondary | ICD-10-CM | POA: Diagnosis not present

## 2019-07-18 DIAGNOSIS — Z0000 Encounter for general adult medical examination without abnormal findings: Secondary | ICD-10-CM | POA: Diagnosis not present

## 2019-07-18 DIAGNOSIS — I10 Essential (primary) hypertension: Secondary | ICD-10-CM | POA: Diagnosis not present

## 2019-07-18 DIAGNOSIS — E7800 Pure hypercholesterolemia, unspecified: Secondary | ICD-10-CM | POA: Diagnosis not present

## 2019-07-18 DIAGNOSIS — M4692 Unspecified inflammatory spondylopathy, cervical region: Secondary | ICD-10-CM | POA: Diagnosis not present

## 2019-09-08 ENCOUNTER — Telehealth: Payer: Self-pay | Admitting: Internal Medicine

## 2019-09-08 NOTE — Telephone Encounter (Signed)
Called and spoke with pt. Pt is needing a refill of her ambien CR [facility] [place]  Dr. Maple Hudson, please advise.  Allergies  Allergen Reactions  . Amlodipine Besylate     Dizziness, disequilibrium, fatigue  . Armodafinil     REACTION: headaches  . Atorvastatin     fatigue  . Etodolac Itching  . Hydrochlorothiazide     Fatigue/ laziness  . Vicodin [Hydrocodone-Acetaminophen] Itching     Current Outpatient Medications:  .  atorvastatin (LIPITOR) 20 MG tablet, Take 20 mg by mouth daily., Disp: , Rfl:  .  BYSTOLIC 5 MG tablet, Take 7.5mg  (1.5 tablets) by mouth Mon-Fri and 5mg  all other days. Please F/U schedule appointment. (Patient taking differently: Take 5 mg by mouth daily. Take 1 tablet by mouth daily), Disp: 45 tablet, Rfl: 2 .  cetirizine (ZYRTEC) 10 MG tablet, Take 10 mg by mouth daily.  , Disp: , Rfl:  .  cyclobenzaprine (FLEXERIL) 5 MG tablet, Take 1 tablet (5 mg total) by mouth as needed., Disp: 30 tablet, Rfl: 0 .  methylphenidate (METADATE ER) 20 MG ER tablet, Twice daily as needed, Disp: 60 tablet, Rfl: 0 .  methylphenidate (RITALIN) 10 MG tablet, Take 1 tablet (10 mg total) by mouth 3 (three) times daily., Disp: 90 tablet, Rfl: 0 .  naproxen (NAPROSYN) 500 MG tablet, Take 1 tablet by mouth as directed., Disp: , Rfl: 2 .  Omega-3 Fatty Acids (FISH OIL CONCENTRATE PO), Take by mouth daily., Disp: , Rfl:  .  RELPAX 40 MG tablet, , Disp: , Rfl: 1 .  triamcinolone (NASACORT ALLERGY 24HR) 55 MCG/ACT AERO nasal inhaler, Place 2 sprays into the nose daily., Disp: , Rfl:  .  zolpidem (AMBIEN CR) 12.5 MG CR tablet, Take 1 tablet (12.5 mg total) by mouth at bedtime as needed., Disp: 90 tablet, Rfl: 1

## 2019-09-09 MED ORDER — ZOLPIDEM TARTRATE ER 12.5 MG PO TBCR: 12.5000 mg | EXTENDED_RELEASE_TABLET | Freq: Every evening | ORAL | 1 refills | Status: DC | PRN

## 2019-09-09 NOTE — Telephone Encounter (Signed)
ambien refill e-sent to [facility]

## 2019-09-18 ENCOUNTER — Telehealth: Payer: Self-pay | Admitting: Internal Medicine

## 2019-09-18 MED ORDER — METHYLPHENIDATE HCL 10 MG PO TABS: 10.0000 mg | ORAL_TABLET | Freq: Three times a day (TID) | ORAL | 0 refills | Status: DC

## 2019-09-18 MED ORDER — METHYLPHENIDATE HCL ER 20 MG PO TBCR: EXTENDED_RELEASE_TABLET | ORAL | 0 refills | Status: DC

## 2019-09-18 NOTE — Telephone Encounter (Signed)
Pt requesting refill on Ritalin 10mg  and 20 mg.   Last refills:  20mg : 07/14/19 #60 with 0 refills, take 1 tab BID PRN 10mg : 07/14/19 #90 with 0 refills, take 1 tab TID Last OV: 02/27/2019 Next OV: 02/29/2020  rx has been pended in chart.  CY please advise on refill.  Thanks!

## 2019-09-22 MED ORDER — METHYLPHENIDATE HCL ER 20 MG PO TBCR: EXTENDED_RELEASE_TABLET | ORAL | 0 refills | Status: DC

## 2019-09-22 MED ORDER — METHYLPHENIDATE HCL 10 MG PO TABS: 10.0000 mg | ORAL_TABLET | Freq: Three times a day (TID) | ORAL | 0 refills | Status: DC

## 2019-09-22 NOTE — Telephone Encounter (Signed)
Ritalin refills e-sent

## 2019-10-10 DIAGNOSIS — J069 Acute upper respiratory infection, unspecified: Secondary | ICD-10-CM | POA: Diagnosis not present

## 2019-11-06 ENCOUNTER — Telehealth: Payer: Self-pay | Admitting: Internal Medicine

## 2019-11-06 MED ORDER — METHYLPHENIDATE HCL 10 MG PO TABS: 10.0000 mg | ORAL_TABLET | Freq: Three times a day (TID) | ORAL | 0 refills | Status: DC

## 2019-11-06 NOTE — Telephone Encounter (Signed)
Pt is aware that her medication has been sent in. Nothing further was needed.

## 2019-11-06 NOTE — Telephone Encounter (Signed)
Ritalin refill e-sent 

## 2019-11-06 NOTE — Telephone Encounter (Signed)
Pt is needing a refill on Ritalin 10mg . This was last refilled on 09/22/2019#90. Her last OV was on 02/27/2019 and has a pending OV on 02/29/2020.  Dr. 03/02/2020 - please advise on refill. Thanks.

## 2019-11-13 DIAGNOSIS — Z1231 Encounter for screening mammogram for malignant neoplasm of breast: Secondary | ICD-10-CM | POA: Diagnosis not present

## 2019-12-29 ENCOUNTER — Telehealth: Payer: Self-pay | Admitting: Internal Medicine

## 2019-12-29 MED ORDER — METHYLPHENIDATE HCL ER 20 MG PO TBCR: EXTENDED_RELEASE_TABLET | ORAL | 0 refills | Status: DC

## 2019-12-29 MED ORDER — METHYLPHENIDATE HCL 10 MG PO TABS: 10.0000 mg | ORAL_TABLET | Freq: Three times a day (TID) | ORAL | 0 refills | Status: DC

## 2019-12-29 NOTE — Telephone Encounter (Signed)
Metadate and ritalin refills e-sent

## 2019-12-29 NOTE — Telephone Encounter (Signed)
Patient requesting refill for Metodate ER 20 mg and Ritalin 10 mg, last OV 02/27/2019. Please advise/refill.

## 2020-01-21 DIAGNOSIS — Z20828 Contact with and (suspected) exposure to other viral communicable diseases: Secondary | ICD-10-CM | POA: Diagnosis not present

## 2020-02-13 DIAGNOSIS — E7800 Pure hypercholesterolemia, unspecified: Secondary | ICD-10-CM | POA: Diagnosis not present

## 2020-02-13 DIAGNOSIS — G43909 Migraine, unspecified, not intractable, without status migrainosus: Secondary | ICD-10-CM | POA: Diagnosis not present

## 2020-02-13 DIAGNOSIS — M4692 Unspecified inflammatory spondylopathy, cervical region: Secondary | ICD-10-CM | POA: Diagnosis not present

## 2020-02-13 DIAGNOSIS — I10 Essential (primary) hypertension: Secondary | ICD-10-CM | POA: Diagnosis not present

## 2020-02-21 ENCOUNTER — Telehealth: Payer: Self-pay | Admitting: Internal Medicine

## 2020-02-21 MED ORDER — METHYLPHENIDATE HCL ER 20 MG PO TBCR: EXTENDED_RELEASE_TABLET | ORAL | 0 refills | Status: DC

## 2020-02-21 MED ORDER — ZOLPIDEM TARTRATE ER 12.5 MG PO TBCR
12.5000 mg | EXTENDED_RELEASE_TABLET | Freq: Every evening | ORAL | 1 refills | Status: DC | PRN
Start: 2020-02-21 — End: 2020-06-17

## 2020-02-21 MED ORDER — METHYLPHENIDATE HCL 10 MG PO TABS
10.0000 mg | ORAL_TABLET | Freq: Three times a day (TID) | ORAL | 0 refills | Status: DC
Start: 2020-02-21 — End: 2020-04-19

## 2020-02-21 NOTE — Telephone Encounter (Signed)
3 meds refilled as requested

## 2020-02-21 NOTE — Telephone Encounter (Signed)
Called and spoke with pt. Pt is needing a refill of Metadate 20mg , Ritalin 10mg , and Ambien CR 12.5mg  [facility] [address]  Dr. , please advise.  Allergies  Allergen Reactions  . Amlodipine Besylate     Dizziness, disequilibrium, fatigue  . Armodafinil     REACTION: headaches  . Atorvastatin     fatigue  . Etodolac Itching  . Hydrochlorothiazide     Fatigue/ laziness  . Vicodin [Hydrocodone-Acetaminophen] Itching     Current Outpatient Medications:  .  atorvastatin (LIPITOR) 20 MG tablet, Take 20 mg by mouth daily., Disp: , Rfl:  .  BYSTOLIC 5 MG tablet, Take 7.5mg  (1.5 tablets) by mouth Mon-Fri and 5mg  all other days. Please F/U schedule appointment. (Patient taking differently: Take 5 mg by mouth daily. Take 1 tablet by mouth daily), Disp: 45 tablet, Rfl: 2 .  cetirizine (ZYRTEC) 10 MG tablet, Take 10 mg by mouth daily.  , Disp: , Rfl:  .  cyclobenzaprine (FLEXERIL) 5 MG tablet, Take 1 tablet (5 mg total) by mouth as needed., Disp: 30 tablet, Rfl: 0 .  methylphenidate (METADATE ER) 20 MG ER tablet, Twice daily as needed, Disp: 60 tablet, Rfl: 0 .  methylphenidate (RITALIN) 10 MG tablet, Take 1 tablet (10 mg total) by mouth 3 (three) times daily., Disp: 90 tablet, Rfl: 0 .  naproxen (NAPROSYN) 500 MG tablet, Take 1 tablet by mouth as directed., Disp: , Rfl: 2 .  Omega-3 Fatty Acids (FISH OIL CONCENTRATE PO), Take by mouth daily., Disp: , Rfl:  .  RELPAX 40 MG tablet, , Disp: , Rfl: 1 .  triamcinolone (NASACORT ALLERGY 24HR) 55 MCG/ACT AERO nasal inhaler, Place 2 sprays into the nose daily., Disp: , Rfl:  .  zolpidem (AMBIEN CR) 12.5 MG CR tablet, Take 1 tablet (12.5 mg total) by mouth at bedtime as needed., Disp: 90 tablet, Rfl: 1

## 2020-02-29 ENCOUNTER — Encounter: Payer: Self-pay | Admitting: Internal Medicine

## 2020-02-29 ENCOUNTER — Other Ambulatory Visit: Payer: Self-pay

## 2020-02-29 ENCOUNTER — Ambulatory Visit: Payer: BC Managed Care – PPO | Admitting: Internal Medicine

## 2020-02-29 DIAGNOSIS — G47419 Narcolepsy without cataplexy: Secondary | ICD-10-CM

## 2020-02-29 DIAGNOSIS — G4700 Insomnia, unspecified: Secondary | ICD-10-CM

## 2020-02-29 NOTE — Assessment & Plan Note (Signed)
We discussed and will continue current meds Safe driving and adequate sleep/ naps emphasized

## 2020-02-29 NOTE — Assessment & Plan Note (Addendum)
Ambien still works well. She is having to get up earlier to get kids to school since mother isn't there to help. Plan- continue [place]

## 2020-02-29 NOTE — Progress Notes (Signed)
Subjective:    Patient ID: Denise Herrera, female    DOB: [birth year], 49 y.o.   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 Denies concerns. Feels well controlled. Denies other significant medical issues.  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 Mother died last winter, so she is using stimulants more regularly, within guidance, and napping less.  She does not plan to get vaccinated.   Review of Systems- see HPI  + = positive Constitutional:   No-   weight loss, night sweats, fevers, chills, +fatigue, lassitude. HEENT:   +  headaches, no-difficulty swallowing, tooth/dental problems, sore throat,       No-  sneezing, itching, ear ache, nasal congestion, post nasal drip,  CV:  No-   chest pain, orthopnea, PND, swelling in lower extremities, anasarca, dizziness, palpitations Resp: No-   shortness of breath with exertion or at rest.              No-   productive cough,   No non-productive cough,  No-  coughing up of blood.              No-   change in color of mucus.  No- wheezing.   Skin: No-   rash or lesions.  GI:  No-   heartburn, indigestion, abdominal pain, nausea, vomiting,  GU: . MS:  No-   joint pain or swelling.   Neuro- nothing unusual Psych:  No- change in mood or affect. No depression or anxiety.  No memory loss    Objective:   Physical Exam General- Alert, Oriented, Affect-appropriate, Distress- none acute  Medium build. Appears well Skin- rash-none, lesions- none, excoriation- none; tatoos Lymphadenopathy- none Head- atraumatic            Eyes- Gross vision intact, PERRLA, conjunctivae clear secretions            Ears- Hearing, canals normal            Nose- Clear, NoSeptal dev, mucus, polyps, erosion, perforation             Throat- Mallampati II , mucosa clear , drainage- none, tonsils- atrophic Neck- flexible , trachea midline, no stridor , thyroid nl, carotid no bruit Chest - symmetrical excursion , unlabored           Heart/CV- RRR , no murmur , no gallop  , no rub, nl s1 s2                           - JVD- none , edema- none, stasis changes- none, varices-  none           Lung- clear to P&A, wheeze- none, cough- none , dullness-none, rub- none           Chest wall-  Abd-  Br/ Gen/ Rectal- Not done, not indicated Extrem- cyanosis- none, clubbing, none, atrophy- none, strength- nl Neuro- grossly intact to observation. No tremor Assessment & Plan:

## 2020-02-29 NOTE — Patient Instructions (Signed)
Ok to continue current meds  Please try to nap when you can, to supplement your meds  Good luck with those grand kids.  I do recommend the Covid vaccine and Flu vaccine- you want to stick around to raise the kids. !!  Please call if we can help

## 2020-04-19 ENCOUNTER — Telehealth: Payer: Self-pay | Admitting: Internal Medicine

## 2020-04-19 MED ORDER — METHYLPHENIDATE HCL ER 20 MG PO TBCR
EXTENDED_RELEASE_TABLET | ORAL | 0 refills | Status: DC
Start: 2020-04-19 — End: 2020-06-19

## 2020-04-19 MED ORDER — METHYLPHENIDATE HCL 10 MG PO TABS
10.0000 mg | ORAL_TABLET | Freq: Three times a day (TID) | ORAL | 0 refills | Status: DC
Start: 2020-04-19 — End: 2020-06-19

## 2020-04-19 NOTE — Telephone Encounter (Signed)
Called and spoke to patient.  Patient is requesting refill on Ritalin 10mg  and 20mg . Last refilled 02/21/2020 #90. Last OV 02/29/2020 with pending OV for 03/03/2020. [facility]  Dr. 03/02/2020, please advise. Thanks   Current Outpatient Medications on File Prior to Visit  Medication Sig Dispense Refill  . atorvastatin (LIPITOR) 20 MG tablet Take 20 mg by mouth daily.    03/05/2020 BYSTOLIC 5 MG tablet Take 7.5mg  (1.5 tablets) by mouth Mon-Fri and 5mg  all other days. Please F/U schedule appointment. (Patient taking differently: Take 5 mg by mouth daily. Take 1 tablet by mouth daily) 45 tablet 2  . cetirizine (ZYRTEC) 10 MG tablet Take 10 mg by mouth daily.      . cyclobenzaprine (FLEXERIL) 5 MG tablet Take 1 tablet (5 mg total) by mouth as needed. 30 tablet 0  . methylphenidate (METADATE ER) 20 MG ER tablet Twice daily as needed 60 tablet 0  . methylphenidate (RITALIN) 10 MG tablet Take 1 tablet (10 mg total) by mouth 3 (three) times daily. 90 tablet 0  . naproxen (NAPROSYN) 500 MG tablet Take 1 tablet by mouth as directed.  2  . Omega-3 Fatty Acids (FISH OIL CONCENTRATE PO) Take by mouth daily.    . RELPAX 40 MG tablet   1  . triamcinolone (NASACORT ALLERGY 24HR) 55 MCG/ACT AERO nasal inhaler Place 2 sprays into the nose daily.    [place] zolpidem (AMBIEN CR) 12.5 MG CR tablet Take 1 tablet (12.5 mg total) by mouth at bedtime as needed. 90 tablet 1   No current facility-administered medications on file prior to visit.    Allergies  Allergen Reactions  . Amlodipine Besylate     Dizziness, disequilibrium, fatigue  . Armodafinil     REACTION: headaches  . Atorvastatin     fatigue  . Etodolac Itching  . Hydrochlorothiazide     Fatigue/ laziness  . Vicodin [Hydrocodone-Acetaminophen] Itching

## 2020-04-19 NOTE — Telephone Encounter (Signed)
Methylphenidate refills e-sent

## 2020-05-27 ENCOUNTER — Other Ambulatory Visit: Payer: BLUE CROSS/BLUE SHIELD

## 2020-05-27 DIAGNOSIS — Z20822 Contact with and (suspected) exposure to covid-19: Secondary | ICD-10-CM | POA: Diagnosis not present

## 2020-05-29 LAB — SARS-COV-2, NAA 2 DAY TAT

## 2020-05-29 LAB — NOVEL CORONAVIRUS, NAA: SARS-CoV-2, NAA: NOT DETECTED

## 2020-06-14 ENCOUNTER — Telehealth: Payer: Self-pay | Admitting: Internal Medicine

## 2020-06-17 MED ORDER — ZOLPIDEM TARTRATE ER 12.5 MG PO TBCR
12.5000 mg | EXTENDED_RELEASE_TABLET | Freq: Every evening | ORAL | 1 refills | Status: DC | PRN
Start: 2020-06-17 — End: 2020-09-18

## 2020-06-17 NOTE — Telephone Encounter (Signed)
Patient called -unable to reach - need verification of pharmacy when patient calls back   Medication name/strength/dose: Ambien 12.5mg  CR Medication last rx'd: 106/21 Quantity and number of refills last rx'd: 90 tablets 1 refill Instructions: take 1 tablet (12.5 mg total) by mouth at bedtime as needed  Last OV: 02/29/20 Next OV: 03/03/21  CY please advise on refill request  Allergies  Allergen Reactions  . Amlodipine Besylate     Dizziness, disequilibrium, fatigue  . Armodafinil     REACTION: headaches  . Atorvastatin     fatigue  . Etodolac Itching  . Hydrochlorothiazide     Fatigue/ laziness  . Vicodin [Hydrocodone-Acetaminophen] Itching   Current Outpatient Medications on File Prior to Visit  Medication Sig Dispense Refill  . atorvastatin (LIPITOR) 20 MG tablet Take 20 mg by mouth daily.    Marland Kitchen BYSTOLIC 5 MG tablet Take 7.5mg  (1.5 tablets) by mouth Mon-Fri and 5mg  all other days. Please F/U schedule appointment. (Patient taking differently: Take 5 mg by mouth daily. Take 1 tablet by mouth daily) 45 tablet 2  . cetirizine (ZYRTEC) 10 MG tablet Take 10 mg by mouth daily.      . cyclobenzaprine (FLEXERIL) 5 MG tablet Take 1 tablet (5 mg total) by mouth as needed. 30 tablet 0  . methylphenidate (METADATE ER) 20 MG ER tablet Twice daily as needed 60 tablet 0  . methylphenidate (RITALIN) 10 MG tablet Take 1 tablet (10 mg total) by mouth 3 (three) times daily. 90 tablet 0  . naproxen (NAPROSYN) 500 MG tablet Take 1 tablet by mouth as directed.  2  . Omega-3 Fatty Acids (FISH OIL CONCENTRATE PO) Take by mouth daily.    . RELPAX 40 MG tablet   1  . triamcinolone (NASACORT ALLERGY 24HR) 55 MCG/ACT AERO nasal inhaler Place 2 sprays into the nose daily.    zolpidem (AMBIEN CR) 12.5 MG CR tablet Take 1 tablet (12.5 mg total) by mouth at bedtime as needed. 90 tablet 1   No current facility-administered medications on file prior to visit.

## 2020-06-17 NOTE — Telephone Encounter (Signed)
[facility]ien refilled at [facility]

## 2020-06-17 NOTE — Telephone Encounter (Signed)
Called and spoke with pt letting her know that CY refilled her ambien and she verbalized understanding. Nothing further needed. 

## 2020-06-18 ENCOUNTER — Telehealth: Payer: Self-pay | Admitting: Internal Medicine

## 2020-06-19 MED ORDER — METHYLPHENIDATE HCL ER 20 MG PO TBCR
EXTENDED_RELEASE_TABLET | ORAL | 0 refills | Status: DC
Start: 2020-06-19 — End: 2020-08-08

## 2020-06-19 MED ORDER — METHYLPHENIDATE HCL 10 MG PO TABS
10.0000 mg | ORAL_TABLET | Freq: Three times a day (TID) | ORAL | 0 refills | Status: DC
Start: 2020-06-19 — End: 2020-08-08

## 2020-06-19 NOTE — Telephone Encounter (Signed)
Called and spoke with pt letting her know that CY refilled both meds for her and she verbalized understanding. Nothing further needed. 

## 2020-06-19 NOTE — Telephone Encounter (Signed)
Methylphenidate scripts refilled 

## 2020-06-19 NOTE — Telephone Encounter (Signed)
Pt requesting refill for the following: Metadate ER 20mg  Take 1 bid prn Last refill 04/19/20 #60 tablets  Methylphenidate 10mg  Take 1 tid Last refill 04/19/20 #90 tablets  Last ov 02/29/20 with Dr 14/3/21

## 2020-07-23 DIAGNOSIS — Z0000 Encounter for general adult medical examination without abnormal findings: Secondary | ICD-10-CM | POA: Diagnosis not present

## 2020-07-23 DIAGNOSIS — I10 Essential (primary) hypertension: Secondary | ICD-10-CM | POA: Diagnosis not present

## 2020-07-23 DIAGNOSIS — M4692 Unspecified inflammatory spondylopathy, cervical region: Secondary | ICD-10-CM | POA: Diagnosis not present

## 2020-07-23 DIAGNOSIS — E7800 Pure hypercholesterolemia, unspecified: Secondary | ICD-10-CM | POA: Diagnosis not present

## 2020-07-23 DIAGNOSIS — G43909 Migraine, unspecified, not intractable, without status migrainosus: Secondary | ICD-10-CM | POA: Diagnosis not present

## 2020-08-08 ENCOUNTER — Telehealth: Payer: Self-pay | Admitting: Internal Medicine

## 2020-08-08 MED ORDER — METHYLPHENIDATE HCL ER 20 MG PO TBCR
EXTENDED_RELEASE_TABLET | ORAL | 0 refills | Status: DC
Start: 2020-08-08 — End: 2020-09-26

## 2020-08-08 MED ORDER — METHYLPHENIDATE HCL 10 MG PO TABS
10.0000 mg | ORAL_TABLET | Freq: Three times a day (TID) | ORAL | 0 refills | Status: DC
Start: 2020-08-08 — End: 2020-09-26

## 2020-08-08 NOTE — Telephone Encounter (Signed)
Called and spoke with Patient. Patient requested refills on Methylphenidate 20mg  ER tabs and Methylphenidate 10mg  tabs.  Methylphenidate 10mg , take 1 tab, three times a day,#90, no refills Methylphenidate ER 20mg , take twice daily as needed, #60, no refills  Message routed to Dr. to advise

## 2020-08-08 NOTE — Telephone Encounter (Signed)
Called and spoke wit Patient. Patient made aware requested prescriptions sent to pharmacy. Nothing further at this time.

## 2020-08-08 NOTE — Telephone Encounter (Signed)
Methylphenidate scripts refilled 

## 2020-08-18 DIAGNOSIS — J029 Acute pharyngitis, unspecified: Secondary | ICD-10-CM | POA: Diagnosis not present

## 2020-08-29 ENCOUNTER — Ambulatory Visit: Payer: BLUE CROSS/BLUE SHIELD | Admitting: Internal Medicine

## 2020-08-29 ENCOUNTER — Encounter: Payer: Self-pay | Admitting: Internal Medicine

## 2020-08-29 ENCOUNTER — Other Ambulatory Visit: Payer: Self-pay

## 2020-08-29 VITALS — BP 158/100 | HR 59 | Ht 64.0 in | Wt 165.6 lb

## 2020-08-29 DIAGNOSIS — E119 Type 2 diabetes mellitus without complications: Secondary | ICD-10-CM

## 2020-08-29 DIAGNOSIS — I10 Essential (primary) hypertension: Secondary | ICD-10-CM

## 2020-08-29 MED ORDER — TELMISARTAN 40 MG PO TABS: 40.0000 mg | ORAL_TABLET | Freq: Every day | ORAL | 3 refills | Status: DC

## 2020-08-29 NOTE — Progress Notes (Signed)
OFFICE NOTE  Chief Complaint:  Follow-up  Primary Care Physician: Tally Joe, MD  HPI:  Denise Herrera is a 50 y.o. female who is coming referred to me by Dr. Tally Joe for evaluation of labile blood pressures. According to the records she's had high blood pressure for which she is asymptomatic. She's been on a number of different hypertensive medications before including amlodipine and hydrochlorothiazide. Both medicines made her feel "drained. She was also on by systolic however the cost is excessive for her. She does have Nurse, learning disability but has never used a co-pay assistance card. In addition she was placed on carvedilol 12.5 mg daily, dose which is higher than the equivalent dose of by systolic. This also caused her to feel somewhat fatigued. Blood pressures were optimal during a number of office visits at 120/78. Her blood pressure today was 136/86. At times it was reported blood pressure could be as high as 180s. This makes me concerned about possible whitecoat hypertension. There is not a long standing history of high blood pressure in the family. She reports her mother's side has some high blood pressure but her father does not and siblings do not. 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 She uses CPAP. The stimulants can raise blood pressure and may explain some increased blood pressures during the day. She says she's had the best control of her blood pressure with by systolic. She denies any chest pain, shortness of breath or anginal symptoms.  01/15/2017  Denise Herrera returns today for follow-up of blood pressure. She reports her in the week her blood pressures are running a little higher. This may be related to her taking Ritalin. On the weekends she does not take the medicine. She is on the Ritalin for narcolepsy. She does use CPAP. She denies any chest pain or worsening shortness of breath.  06/02/2018  Denise Herrera is  seen today for follow-up of labile blood pressures.  She has been on different doses of Bystolic but recently on 5 mg daily.  She was also on valsartan for a short period of time but had low blood pressures and felt unwell.  This was stopped and she is done better.  Blood pressure now is somewhere between 120 and 130 systolic.  She continues on Ritalin during the day and medication to help her sleep at night.  08/29/2020  Denise Herrera returns today for follow-up.  Her PCP sent her back because of uncontrolled hypertension.  At times in the past her blood pressure looked a little better however is seemingly increased.  She was instructed to take a greater dose of Bystolic but had side effects and said she felt unwell while driving.  I suspect this might be related to bradycardia.  Heart rates in the upper 50s now on 5 mg which is a current dose she is taking.  Blood pressure today was 158/100.  She will likely need a second agent.  Unfortunately she had side effects on amlodipine and hydrochlorothiazide in the past.  Her renal function is normal as of March with a creatinine of 0.93.  PMHx:  Past Medical History:  Diagnosis Date  . Chronic insomnia   . History of migraine headaches   . Narcolepsy without cataplexy(347.00)     Past Surgical History:  Procedure Laterality Date  . CESAREAN SECTION    . gyn cervical procedure      FAMHx:  Family History  Problem  Relation Age of Onset  . Asthma Mother   . Hypertension Mother   . Cancer Mother        breast cancer  . Hypertension Maternal Grandfather   . Diabetes Maternal Grandfather   . Hypertension Maternal Grandmother     SOCHx:   reports that she has never smoked. She has never used smokeless tobacco. She reports that she does not drink alcohol and does not use drugs.  ALLERGIES:  Allergies  Allergen Reactions  . Amlodipine Besylate     Dizziness, disequilibrium, fatigue  . Armodafinil     REACTION: headaches  . Atorvastatin      fatigue  . Etodolac Itching  . Hydrochlorothiazide     Fatigue/ laziness  . Vicodin [Hydrocodone-Acetaminophen] Itching    ROS: Pertinent items noted in HPI and remainder of comprehensive ROS otherwise negative.  HOME MEDS: Current Outpatient Medications  Medication Sig Dispense Refill  . atorvastatin (LIPITOR) 20 MG tablet Take 20 mg by mouth daily.    Marland Kitchen BYSTOLIC 5 MG tablet Take 7.5mg  (1.5 tablets) by mouth Mon-Fri and 5mg  all other days. Please F/U schedule appointment. (Patient taking differently: Take 5 mg by mouth daily. Take 1 tablet by mouth daily) 45 tablet 2  . cetirizine (ZYRTEC) 10 MG tablet Take 10 mg by mouth daily.    . cyclobenzaprine (FLEXERIL) 5 MG tablet Take 1 tablet (5 mg total) by mouth as needed. 30 tablet 0  . methylphenidate (METADATE ER) 20 MG ER tablet Twice daily as needed (Patient taking differently: Twice daily) 60 tablet 0  . methylphenidate (RITALIN) 10 MG tablet Take 1 tablet (10 mg total) by mouth 3 (three) times daily. 90 tablet 0  . naproxen (NAPROSYN) 500 MG tablet Take 1 tablet by mouth as directed.  2  . Omega-3 Fatty Acids (FISH OIL CONCENTRATE PO) Take by mouth daily.    . RELPAX 40 MG tablet   1  . triamcinolone (NASACORT) 55 MCG/ACT AERO nasal inhaler Place 2 sprays into the nose daily.    zolpidem (AMBIEN CR) 12.5 MG CR tablet Take 1 tablet (12.5 mg total) by mouth at bedtime as needed. 90 tablet 1   No current facility-administered medications for this visit.    LABS/IMAGING: No results found for this or any previous visit (from the past 48 hour(s)). No results found.  WEIGHTS: Wt Readings from Last 3 Encounters:  08/29/20 165 lb 9.6 oz (75.1 kg)  02/29/20 167 lb 12.8 oz (76.1 kg)  02/27/19 173 lb 12.8 oz (78.8 kg)    VITALS: BP (!) 158/100 (BP Location: Left Arm, Patient Position: Sitting)   Pulse (!) 59   Ht 5\' 4"  (1.626 m)   Wt 165 lb 9.6 oz (75.1 kg)   SpO2 100%   BMI 28.43 kg/m   EXAM: General appearance: alert and  no distress Neck: no carotid bruit and no JVD Lungs: clear to auscultation bilaterally Heart: regular rate and rhythm, S1, S2 normal, no murmur, click, rub or gallop Abdomen: soft, non-tender; bowel sounds normal; no masses,  no organomegaly Extremities: extremities normal, atraumatic, no cyanosis or edema Pulses: 2+ and symmetric Skin: Skin color, texture, turgor normal. No rashes or lesions Neurologic: Grossly normal Psych: Pleasant  EKG: Sinus bradycardia, possible left atrial enlargement at 59-personally reviewed  ASSESSMENT: 1. Uncontrolled hypertension 2. Narcolepsy on stimulants  PLAN: 1.   Denise Herrera has uncontrolled hypertension.  She will likely need an additional agent.  She is really hesitant to take additional medicine because  of side effects.  She had inaccurate reading she said with the home blood pressure cuff and is unwilling to purchase another cuff in order to follow it.  Although she is skeptical about this she understands the seriousness of good blood pressure control and the endorgan effects of uncontrolled hypertension.  Would recommend adding telmisartan 40 mg daily to her current regimen.  She will continue with the Bystolic.  We will bring her back in about a month with one of our nurse practitioners with the hypertension clinic to recheck her blood pressure.  Chrystie Nose, MD, [facility], FACP  [place]  [facility]  Medical Director of the [facility]  Cardiovascular Risk Reduction Clinic Diplomate of the American Board of Clinical Lipidology Attending Cardiologist  Direct Dial: [phone]  Fax: [phone]  Website:  [email or web address] Nicely Nailea Whitehorn 08/29/2020, 8:32 AM

## 2020-08-29 NOTE — Patient Instructions (Signed)
Medication Instructions:  BEGIN Telmisartan 40mg  once daily.  *If you need a refill on your cardiac medications before your next appointment, please call your pharmacy*   Lab Work: None ordered  If you have labs (blood work) drawn today and your tests are completely normal, you will receive your results only by: MyChart Message (if you have MyChart) OR . A paper copy in the mail If you have any lab test that is abnormal or we need to change your treatment, we will call you to review the results.   Testing/Procedures: None ordered  Follow-Up: At [facility], you and your health needs are our priority.  As part of our continuing mission to provide you with exceptional heart care, we have created designated Provider Care Teams.  These Care Teams include your primary Cardiologist (physician) and Advanced Practice Providers (APPs -  Physician Assistants and Nurse Practitioners) who all work together to provide you with the care you need, when you need it.  We recommend signing up for the patient portal called "MyChart".  Sign up information is provided on this After Visit Summary.  MyChart is used to connect with patients for Virtual Visits (Telemedicine).  Patients are able to view lab/test results, encounter notes, upcoming appointments, etc.  Non-urgent messages can be sent to your provider as well.   To learn more about what you can do with MyChart, go to [facility].    Your next appointment:   1-2 month(s)  The format for your next appointment:   In Person  Provider:   [email or web address] Kirtland Bouchard Hilty, MD or [place], NP

## 2020-09-18 ENCOUNTER — Telehealth: Payer: Self-pay | Admitting: Internal Medicine

## 2020-09-18 MED ORDER — ZOLPIDEM TARTRATE ER 12.5 MG PO TBCR: 12.5000 mg | EXTENDED_RELEASE_TABLET | Freq: Every evening | ORAL | 1 refills | Status: DC | PRN

## 2020-09-18 NOTE — Telephone Encounter (Signed)
Received faxed refill request from   Pt called the office  Medication name/strength/dose: zolpidem cr 12.5 mg Medication last rx'd: 06/17/2020 Quantity and number of refills last rx'd: #90 with 1 refill Instructions: take 1 tablet by mouth at bedtime as needed  Last OV: 02/29/2020 Next OV: 03/03/2021  [facility]  please advise on refill request  Allergies  Allergen Reactions  . Amlodipine Besylate     Dizziness, disequilibrium, fatigue  . Armodafinil     REACTION: headaches  . Atorvastatin     fatigue  . Etodolac Itching  . Hydrochlorothiazide     Fatigue/ laziness  . Vicodin [Hydrocodone-Acetaminophen] Itching   Current Outpatient Medications on File Prior to Visit  Medication Sig Dispense Refill  . atorvastatin (LIPITOR) 20 MG tablet Take 20 mg by mouth daily.    . cetirizine (ZYRTEC) 10 MG tablet Take 10 mg by mouth daily.    . cyclobenzaprine (FLEXERIL) 5 MG tablet Take 1 tablet (5 mg total) by mouth as needed. 30 tablet 0  . methylphenidate (METADATE ER) 20 MG ER tablet Twice daily as needed (Patient taking differently: Twice daily) 60 tablet 0  . methylphenidate (RITALIN) 10 MG tablet Take 1 tablet (10 mg total) by mouth 3 (three) times daily. 90 tablet 0  . naproxen (NAPROSYN) 500 MG tablet Take 1 tablet by mouth as directed.  2  . nebivolol (BYSTOLIC) 5 MG tablet Take 5 mg by mouth daily.    . Omega-3 Fatty Acids (FISH OIL CONCENTRATE PO) Take by mouth daily.    . RELPAX 40 MG tablet   1  . telmisartan (MICARDIS) 40 MG tablet Take 1 tablet (40 mg total) by mouth daily. 90 tablet 3  . triamcinolone (NASACORT) 55 MCG/ACT AERO nasal inhaler Place 2 sprays into the nose daily.    Marland Kitchen zolpidem (AMBIEN CR) 12.5 MG CR tablet Take 1 tablet (12.5 mg total) by mouth at bedtime as needed. 90 tablet 1   No current facility-administered medications on file prior to visit.

## 2020-09-18 NOTE — Telephone Encounter (Signed)
Zolpidem refilled.

## 2020-09-19 NOTE — Telephone Encounter (Signed)
lmtcb for pt.  

## 2020-09-23 NOTE — Telephone Encounter (Signed)
Called and left detailed message of the new Ambien script. Will close encounter.

## 2020-09-26 ENCOUNTER — Telehealth: Payer: Self-pay | Admitting: Internal Medicine

## 2020-09-26 MED ORDER — METHYLPHENIDATE HCL 10 MG PO TABS: 10.0000 mg | ORAL_TABLET | Freq: Three times a day (TID) | ORAL | 0 refills | Status: DC

## 2020-09-26 MED ORDER — METHYLPHENIDATE HCL ER 20 MG PO TBCR: EXTENDED_RELEASE_TABLET | ORAL | 0 refills | Status: DC

## 2020-09-26 NOTE — Telephone Encounter (Signed)
Ritalin scripts refilled 

## 2020-09-26 NOTE — Telephone Encounter (Signed)
I called and spoke with patient regarding refills on both Methylphenidate 10mg  and 20mg  to be sent to [facility] on [address]. Informed patient will route to Dr. and reach out once we hear back.  Dr. , please advise on refills. Thanks!

## 2020-09-26 NOTE — Telephone Encounter (Signed)
I called and notified patient that scripts have been to pharmacy. Patient verbalized understanding, nothing further needed.

## 2020-10-15 DIAGNOSIS — R0981 Nasal congestion: Secondary | ICD-10-CM | POA: Diagnosis not present

## 2020-11-19 ENCOUNTER — Telehealth: Payer: Self-pay | Admitting: Internal Medicine

## 2020-11-19 DIAGNOSIS — Z1231 Encounter for screening mammogram for malignant neoplasm of breast: Secondary | ICD-10-CM | POA: Diagnosis not present

## 2020-11-19 MED ORDER — METHYLPHENIDATE HCL ER 20 MG PO TBCR: EXTENDED_RELEASE_TABLET | ORAL | 0 refills | Status: DC

## 2020-11-19 MED ORDER — METHYLPHENIDATE HCL 10 MG PO TABS: 10.0000 mg | ORAL_TABLET | Freq: Three times a day (TID) | ORAL | 0 refills | Status: DC

## 2020-11-19 NOTE — Telephone Encounter (Signed)
Dr Maple Hudson, please advise on refill for methylphenidate 10 and 20 mg  Last filled 09/26/20 Next ov 03/03/21

## 2020-11-19 NOTE — Telephone Encounter (Signed)
Methylphenidate scripts refilled 

## 2020-11-19 NOTE — Progress Notes (Deleted)
Cardiology Clinic Note   Patient Name: Denise Herrera Date of Encounter: 11/19/2020  Primary Care Provider:  Tally Joe, MD Primary Cardiologist:  Chrystie Nose, MD  Patient Profile    Denise Herrera 50 year old female presents the clinic today for follow-up evaluation of her hypertension.  Past Medical History    Past Medical History:  Diagnosis Date   Chronic insomnia    History of migraine headaches    Narcolepsy without cataplexy(347.00)    Past Surgical History:  Procedure Laterality Date   CESAREAN SECTION     gyn cervical procedure      Allergies  Allergies  Allergen Reactions   Amlodipine Besylate     Dizziness, disequilibrium, fatigue   Armodafinil     REACTION: headaches   Atorvastatin     fatigue   Etodolac Itching   Hydrochlorothiazide     Fatigue/ laziness   Vicodin [Hydrocodone-Acetaminophen] Itching    History of Present Illness    [name] has a PMH of labile blood pressures and type 2 diabetes.  She was initially referred for her elevated blood pressure.  She was asymptomatic at that time.  She had been on a number of different hypertensive medications before taking amlodipine and HCTZ.  She reported that the medications made her feel " drained."  She was additionally placed on carvedilol 12.5 mg daily.  This medication also made her feel fatigued.  Her blood pressures at that time were 120/78.  PMH also includes OSA: She has been compliant on her CPAP.  She returns for follow-up 8/18.  During that time she reported that her blood pressures were running a little higher.  It was felt that this may be due to her taking Ritalin.  She reported that on the weekend she did not take the medication.  She remained compliant with her CPAP.  She denied chest pain worsening shortness of breath.  She returns for follow-up 1/20.  During that time she reported that she had been on different doses of Bystolic.  At that time she was on 5 mg daily.  She was  also on valsartan which made her feel unwell.  The medication was stopped and she felt better.  Her blood pressures were ranging in the 120-130 systolic range.  She continued on her Ritalin daily and sleep aid medication.  She was seen again in follow-up 4/22.  She was sent back to the cardiology office for uncontrolled hypertension.  She was instructed to take a higher dose of her Bystolic but had side effects with the medication and felt unwell while driving.  Her heart rate was in the upper 50s on 5 mg.  Her blood pressure was 158/100.  Her renal function was normal in March with a creatinine of 0.93.  Telmisartan 40 mg daily was added to her medication regimen.  Her Bystolic was continued.  She presents the clinic today for follow-up evaluation and states***  *** denies chest pain, shortness of breath, lower extremity edema, fatigue, palpitations, melena, hematuria, hemoptysis, diaphoresis, weakness, presyncope, syncope, orthopnea, and PND.   Currently taking: Bystolic 5 mg daily Telmisartan 40 mg daily  Did not tolerate: Amlodipine-dizziness, fatigue HCTZ-fatigue, laziness Valsartan-felt unwell  Home Medications    Prior to Admission medications   Medication Sig Start Date End Date Taking? Authorizing Provider  atorvastatin (LIPITOR) 20 MG tablet Take 20 mg by mouth daily.    [provider]  cetirizine (ZYRTEC) 10 MG tablet Take 10 mg by mouth  daily.    [provider]  cyclobenzaprine (FLEXERIL) 5 MG tablet Take 1 tablet (5 mg total) by mouth as needed. 03/08/19   Waymon Budge, MD  methylphenidate (METADATE ER) 20 MG ER tablet Twice daily as needed 09/26/20   Jetty Duhamel D, MD  methylphenidate (RITALIN) 10 MG tablet Take 1 tablet (10 mg total) by mouth 3 (three) times daily. 09/26/20   Jetty Duhamel D, MD  naproxen (NAPROSYN) 500 MG tablet Take 1 tablet by mouth as directed. 12/22/16   [provider]  nebivolol (BYSTOLIC) 5 MG tablet Take 5 mg by  mouth daily.    [provider]  Omega-3 Fatty Acids (FISH OIL CONCENTRATE PO) Take by mouth daily.    [provider]  RELPAX 40 MG tablet  01/01/15   [provider]  telmisartan (MICARDIS) 40 MG tablet Take 1 tablet (40 mg total) by mouth daily. 08/29/20   Hilty, Lisette Abu, MD  triamcinolone (NASACORT) 55 MCG/ACT AERO nasal inhaler Place 2 sprays into the nose daily.    [provider]  zolpidem (AMBIEN CR) 12.5 MG CR tablet Take 1 tablet (12.5 mg total) by mouth at bedtime as needed. 09/18/20   Waymon Budge, MD    Family History    Family History  Problem Relation Age of Onset   Asthma Mother    Hypertension Mother    Cancer Mother        breast cancer   Hypertension Maternal Grandfather    Diabetes Maternal Grandfather    Hypertension Maternal Grandmother    She indicated that her mother is alive. She indicated that the status of her maternal grandmother is unknown. She indicated that the status of her maternal grandfather is unknown. She indicated that the status of her son is unknown and reported the following: hx of asthma. She indicated that two of her four others are alive. She reported the following about one of her unknown others: grandparents hx of DM. She reported the following about another other of unknown status: aunt hx of breast cancer.  Social History    Social History   Socioeconomic History   Marital status: Married    Spouse name: Not on file   Number of children: 3   Years of education: Not on file   Highest education level: Not on file  Occupational History   Occupation: makes medical hose and prostheses  Tobacco Use   Smoking status: Never   Smokeless tobacco: Never  Substance and Sexual Activity   Alcohol use: No    Alcohol/week: 0.0 standard drinks   Drug use: No   Sexual activity: Not on file  Other Topics Concern   Not on file  Social History Narrative   Not on file   Social Determinants of Health    Financial Resource Strain: Not on file  Food Insecurity: Not on file  Transportation Needs: Not on file  Physical Activity: Not on file  Stress: Not on file  Social Connections: Not on file  Intimate Partner Violence: Not on file     Review of Systems    General:  No chills, fever, night sweats or weight changes.  Cardiovascular:  No chest pain, dyspnea on exertion, edema, orthopnea, palpitations, paroxysmal nocturnal dyspnea. Dermatological: No rash, lesions/masses Respiratory: No cough, dyspnea Urologic: No hematuria, dysuria Abdominal:   No nausea, vomiting, diarrhea, bright red blood per rectum, melena, or hematemesis Neurologic:  No visual changes, wkns, changes in mental status. All other systems  reviewed and are otherwise negative except as noted above.  Physical Exam    VS:  There were no vitals taken for this visit. , BMI There is no height or weight on file to calculate BMI. GEN: Well nourished, well developed, in no acute distress. HEENT: normal. Neck: Supple, no JVD, carotid bruits, or masses. Cardiac: RRR, no murmurs, rubs, or gallops. No clubbing, cyanosis, edema.  Radials/DP/PT 2+ and equal bilaterally.  Respiratory:  Respirations regular and unlabored, clear to auscultation bilaterally. GI: Soft, nontender, nondistended, BS + x 4. MS: no deformity or atrophy. Skin: warm and dry, no rash. Neuro:  Strength and sensation are intact. Psych: Normal affect.  Accessory Clinical Findings    Recent Labs: No results found for requested labs within last 8760 hours.   Recent Lipid Panel No results found for: CHOL, TRIG, HDL, CHOLHDL, VLDL, LDLCALC, LDLDIRECT  ECG personally reviewed by me today- *** - No acute changes  EKG 08/30/2020 Sinus bradycardia left atrial enlargement 59 bpm  Assessment & Plan   1.  Uncontrolled hypertension-BP today***.  Much better control at home with the addition of telmisartan.  She brings a blood pressure log which  shows*** Continue Bystolic, telmisartan Heart healthy low-sodium diet-salty 6 given Increase physical activity as tolerated Maintain blood pressure log Reviewed secondary causes of hypertension.   Disposition: Follow-up with pharmacy hypertension clinic in 1 month.  Thomasene Ripple. Maxximus Gotay NP-C    11/19/2020, 4:25 PM [facility] [address] Office [phone] Fax [phone]  Notice: This dictation was prepared with Dragon dictation along with smaller phrase technology. Any transcriptional errors that result from this process are unintentional and may not be corrected upon review.  I spent***minutes examining this patient, reviewing medications, and using patient centered shared decision making involving her cardiac care.  Prior to her visit I spent greater than 20 minutes reviewing her past medical history,  medications, and prior cardiac tests.

## 2020-11-20 NOTE — Telephone Encounter (Signed)
Patient is aware of below message and voiced her understanding.  Nothing further needed.   

## 2020-11-21 ENCOUNTER — Ambulatory Visit: Payer: BLUE CROSS/BLUE SHIELD | Admitting: General Practice

## 2020-11-26 DIAGNOSIS — R928 Other abnormal and inconclusive findings on diagnostic imaging of breast: Secondary | ICD-10-CM | POA: Diagnosis not present

## 2020-11-26 DIAGNOSIS — R921 Mammographic calcification found on diagnostic imaging of breast: Secondary | ICD-10-CM | POA: Diagnosis not present

## 2020-11-26 DIAGNOSIS — R922 Inconclusive mammogram: Secondary | ICD-10-CM | POA: Diagnosis not present

## 2020-12-25 DIAGNOSIS — M67911 Unspecified disorder of synovium and tendon, right shoulder: Secondary | ICD-10-CM | POA: Diagnosis not present

## 2021-01-09 ENCOUNTER — Other Ambulatory Visit: Payer: Self-pay | Admitting: Internal Medicine

## 2021-01-09 NOTE — Telephone Encounter (Signed)
Received faxed refill request from pt called in for refills  Medication name/strength/dose:ritalin 10 Metadate ER 20 mg, Ambien cr 12.5  Medication last rx'd: Metadate #60 on 11/19/2020; Ritalin #90 on 11/19/20; Ambien #90 on 09/18/2020 Quantity and number of refills last rx'd: no refills on all meds Instructions: see med list  Last OV: 02/29/2020 Next OV: 03/03/21  CY  please advise on refill request  Allergies  Allergen Reactions   Amlodipine Besylate     Dizziness, disequilibrium, fatigue   Armodafinil     REACTION: headaches   Atorvastatin     fatigue   Etodolac Itching   Hydrochlorothiazide     Fatigue/ laziness   Vicodin [Hydrocodone-Acetaminophen] Itching   Current Outpatient Medications on File Prior to Visit  Medication Sig Dispense Refill   atorvastatin (LIPITOR) 20 MG tablet Take 20 mg by mouth daily.     cetirizine (ZYRTEC) 10 MG tablet Take 10 mg by mouth daily.     cyclobenzaprine (FLEXERIL) 5 MG tablet Take 1 tablet (5 mg total) by mouth as needed. 30 tablet 0   methylphenidate (METADATE ER) 20 MG ER tablet Twice daily as needed 60 tablet 0   methylphenidate (RITALIN) 10 MG tablet Take 1 tablet (10 mg total) by mouth 3 (three) times daily. 90 tablet 0   naproxen (NAPROSYN) 500 MG tablet Take 1 tablet by mouth as directed.  2   nebivolol (BYSTOLIC) 5 MG tablet Take 5 mg by mouth daily.     Omega-3 Fatty Acids (FISH OIL CONCENTRATE PO) Take by mouth daily.     RELPAX 40 MG tablet   1   telmisartan (MICARDIS) 40 MG tablet Take 1 tablet (40 mg total) by mouth daily. 90 tablet 3   triamcinolone (NASACORT) 55 MCG/ACT AERO nasal inhaler Place 2 sprays into the nose daily.     zolpidem (AMBIEN CR) 12.5 MG CR tablet Take 1 tablet (12.5 mg total) by mouth at bedtime as needed. 90 tablet 1   No current facility-administered medications on file prior to visit.

## 2021-01-10 MED ORDER — METHYLPHENIDATE HCL 10 MG PO TABS: 10.0000 mg | ORAL_TABLET | Freq: Three times a day (TID) | ORAL | 0 refills | Status: DC

## 2021-01-10 MED ORDER — ZOLPIDEM TARTRATE ER 12.5 MG PO TBCR: 12.5000 mg | EXTENDED_RELEASE_TABLET | Freq: Every evening | ORAL | 1 refills | Status: DC | PRN

## 2021-01-10 MED ORDER — METHYLPHENIDATE HCL ER 20 MG PO TBCR: EXTENDED_RELEASE_TABLET | ORAL | 0 refills | Status: DC

## 2021-01-10 NOTE — Telephone Encounter (Signed)
Refills sent for 3 meds as requested

## 2021-02-05 DIAGNOSIS — M67911 Unspecified disorder of synovium and tendon, right shoulder: Secondary | ICD-10-CM | POA: Diagnosis not present

## 2021-02-06 ENCOUNTER — Other Ambulatory Visit: Payer: Self-pay | Admitting: Orthopedic Surgery

## 2021-02-06 DIAGNOSIS — M25511 Pain in right shoulder: Secondary | ICD-10-CM

## 2021-02-19 ENCOUNTER — Other Ambulatory Visit: Payer: Self-pay

## 2021-02-19 ENCOUNTER — Ambulatory Visit
Admission: RE | Admit: 2021-02-19 | Discharge: 2021-02-19 | Disposition: A | Discharge status: Home / Self Care | Payer: BLUE CROSS/BLUE SHIELD | Source: Ambulatory Visit | Attending: Orthopedic Surgery | Admitting: Orthopedic Surgery

## 2021-02-19 DIAGNOSIS — M25511 Pain in right shoulder: Secondary | ICD-10-CM

## 2021-02-22 ENCOUNTER — Other Ambulatory Visit: Payer: BLUE CROSS/BLUE SHIELD

## 2021-02-24 ENCOUNTER — Telehealth: Payer: Self-pay | Admitting: Internal Medicine

## 2021-02-24 MED ORDER — METHYLPHENIDATE HCL 10 MG PO TABS: 10.0000 mg | ORAL_TABLET | Freq: Three times a day (TID) | ORAL | 0 refills | Status: DC

## 2021-02-24 MED ORDER — METHYLPHENIDATE HCL ER 20 MG PO TBCR: EXTENDED_RELEASE_TABLET | ORAL | 0 refills | Status: DC

## 2021-02-24 NOTE — Telephone Encounter (Signed)
Dr. Maple Hudson, please advise if you are okay refilling both pt's Ritalin 10 and 20mg . [facility].  Allergies  Allergen Reactions   Amlodipine Besylate     Dizziness, disequilibrium, fatigue   Armodafinil     REACTION: headaches   Atorvastatin     fatigue   Etodolac Itching   Hydrochlorothiazide     Fatigue/ laziness   Vicodin [Hydrocodone-Acetaminophen] Itching    Current Outpatient Medications:    atorvastatin (LIPITOR) 20 MG tablet, Take 20 mg by mouth daily., Disp: , Rfl:    cetirizine (ZYRTEC) 10 MG tablet, Take 10 mg by mouth daily., Disp: , Rfl:    cyclobenzaprine (FLEXERIL) 5 MG tablet, Take 1 tablet (5 mg total) by mouth as needed., Disp: 30 tablet, Rfl: 0   methylphenidate (METADATE ER) 20 MG ER tablet, Twice daily as needed, Disp: 60 tablet, Rfl: 0   methylphenidate (RITALIN) 10 MG tablet, Take 1 tablet (10 mg total) by mouth 3 (three) times daily., Disp: 90 tablet, Rfl: 0   naproxen (NAPROSYN) 500 MG tablet, Take 1 tablet by mouth as directed., Disp: , Rfl: 2   nebivolol (BYSTOLIC) 5 MG tablet, Take 5 mg by mouth daily., Disp: , Rfl:    Omega-3 Fatty Acids (FISH OIL CONCENTRATE PO), Take by mouth daily., Disp: , Rfl:    RELPAX 40 MG tablet, , Disp: , Rfl: 1   telmisartan (MICARDIS) 40 MG tablet, Take 1 tablet (40 mg total) by mouth daily., Disp: 90 tablet, Rfl: 3   triamcinolone (NASACORT) 55 MCG/ACT AERO nasal inhaler, Place 2 sprays into the nose daily., Disp: , Rfl:    zolpidem (AMBIEN CR) 12.5 MG CR tablet, Take 1 tablet (12.5 mg total) by mouth at bedtime as needed., Disp: 90 tablet, Rfl: 1

## 2021-02-24 NOTE — Telephone Encounter (Signed)
Called and spoke with pt letting her know that [facility] refilled both Rx for her and she verbalized understanding. Nothing further needed.

## 2021-02-24 NOTE — Telephone Encounter (Signed)
Ritalin scripts refilled 

## 2021-02-26 DIAGNOSIS — M75121 Complete rotator cuff tear or rupture of right shoulder, not specified as traumatic: Secondary | ICD-10-CM | POA: Diagnosis not present

## 2021-02-28 DIAGNOSIS — J3489 Other specified disorders of nose and nasal sinuses: Secondary | ICD-10-CM | POA: Diagnosis not present

## 2021-02-28 DIAGNOSIS — J029 Acute pharyngitis, unspecified: Secondary | ICD-10-CM | POA: Diagnosis not present

## 2021-02-28 DIAGNOSIS — Z03818 Encounter for observation for suspected exposure to other biological agents ruled out: Secondary | ICD-10-CM | POA: Diagnosis not present

## 2021-03-02 NOTE — Progress Notes (Signed)
Subjective:    Patient ID: Denise Herrera, female    DOB: May 15, 1971, 50 y.o.   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 Mother died last winter, so she is using stimulants more regularly, within guidance, and napping less.  She does not plan to get vaccinated.   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 Still working.  She is pending shoulder surgery- Dr Ave Filter. Denies heart or lung problems and currently feels well.   Review of Systems- see HPI  + = positive Constitutional:   No-   weight loss, night sweats, fevers, chills, +fatigue, lassitude. HEENT:   +  headaches, no-difficulty swallowing, tooth/dental problems, sore throat,       No-  sneezing, itching, ear ache, nasal congestion, post nasal drip,  CV:  No-   chest pain, orthopnea, PND, swelling in lower extremities, anasarca, dizziness, palpitations Resp: No-   shortness of breath with exertion or at  rest.              No-   productive cough,  No non-productive cough,  No-  coughing up of blood.              No-   change in color of mucus.  No- wheezing.   Skin: No-   rash or lesions.  GI:  No-   heartburn, indigestion, abdominal pain, nausea, vomiting,  GU: . MS:  No-   joint pain or swelling.   Neuro- nothing unusual Psych:  No- change in mood or affect. No depression or anxiety.  No memory loss    Objective:   Physical Exam General- Alert, Oriented, Affect-appropriate, Distress- none acute  Medium build. Appears well Skin- rash-none, lesions- none, excoriation- none; tatoos Lymphadenopathy- none Head- atraumatic            Eyes- Gross vision intact, PERRLA, conjunctivae clear secretions            Ears- Hearing, canals normal            Nose- Clear, NoSeptal dev, mucus, polyps, erosion, perforation             Throat- Mallampati II , mucosa clear , drainage- none, tonsils- atrophic Neck- flexible , trachea midline, no stridor , thyroid nl, carotid no bruit Chest - symmetrical excursion , unlabored           Heart/CV- RRR , no murmur , no gallop  , no rub, nl s1 s2                           -  JVD- none , edema- none, stasis changes- none, varices- none           Lung- clear to P&A, wheeze- none, cough- none , dullness-none, rub- none           Chest wall-  Abd-  Br/ Gen/ Rectal- Not done, not indicated Extrem- cyanosis- none, clubbing, none, atrophy- none, strength- nl Neuro- grossly intact to observation. No tremor Assessment & Plan:

## 2021-03-03 ENCOUNTER — Other Ambulatory Visit: Payer: Self-pay

## 2021-03-03 ENCOUNTER — Ambulatory Visit (INDEPENDENT_AMBULATORY_CARE_PROVIDER_SITE_OTHER): Payer: BLUE CROSS/BLUE SHIELD | Admitting: Internal Medicine

## 2021-03-03 ENCOUNTER — Encounter: Payer: Self-pay | Admitting: Internal Medicine

## 2021-03-03 DIAGNOSIS — G47419 Narcolepsy without cataplexy: Secondary | ICD-10-CM

## 2021-03-03 DIAGNOSIS — I10 Essential (primary) hypertension: Secondary | ICD-10-CM | POA: Diagnosis not present

## 2021-03-03 NOTE — Patient Instructions (Signed)
You can continue current meds  You are clear from pulmonary/ sleep medicine standpoint for planned shoulder surgery.  Please call if we can help

## 2021-03-04 ENCOUNTER — Encounter: Payer: Self-pay | Admitting: Internal Medicine

## 2021-03-04 NOTE — Assessment & Plan Note (Signed)
She continues to benefit from appropriate use of stimulant meds on long-term basis. No concerns.  I don't see any related barrier to surgical clearance from pulmonary/ sleep medicine perspective, as discussed.  Plan- Continue current meds and naps.

## 2021-03-04 NOTE — Assessment & Plan Note (Signed)
Managed elsewhere. There doesn't seem to have been any concern raised about her narcolepsy meds.

## 2021-03-11 DIAGNOSIS — J3489 Other specified disorders of nose and nasal sinuses: Secondary | ICD-10-CM | POA: Diagnosis not present

## 2021-03-12 ENCOUNTER — Telehealth: Payer: Self-pay | Admitting: Internal Medicine

## 2021-03-12 NOTE — Telephone Encounter (Signed)
Fax received from Dr. Jones Broom to perform a Right shoulder arthroscopy rotator cuff repair, subacromial decompression on patient.  Patient needs surgery clearance. Patient was seen on 03/03/2021. Office protocol is a risk assessment can be sent to surgeon if patient has been seen in 60 days or less.   During OV on 03/03/21 surgical clearance was addressed will fax OV notes and form back to [facility].

## 2021-03-12 NOTE — Telephone Encounter (Signed)
OV notes and form faxed to [facility], confirmation received. Nothing further needed at this time.

## 2021-03-18 DIAGNOSIS — G8918 Other acute postprocedural pain: Secondary | ICD-10-CM | POA: Diagnosis not present

## 2021-03-18 DIAGNOSIS — M75121 Complete rotator cuff tear or rupture of right shoulder, not specified as traumatic: Secondary | ICD-10-CM | POA: Diagnosis not present

## 2021-03-18 DIAGNOSIS — M7541 Impingement syndrome of right shoulder: Secondary | ICD-10-CM | POA: Diagnosis not present

## 2021-03-28 DIAGNOSIS — M75121 Complete rotator cuff tear or rupture of right shoulder, not specified as traumatic: Secondary | ICD-10-CM | POA: Diagnosis not present

## 2021-04-21 ENCOUNTER — Other Ambulatory Visit: Payer: Self-pay | Admitting: Internal Medicine

## 2021-04-21 MED ORDER — METHYLPHENIDATE HCL 10 MG PO TABS: 10.0000 mg | ORAL_TABLET | Freq: Three times a day (TID) | ORAL | 0 refills | Status: DC

## 2021-04-21 MED ORDER — METHYLPHENIDATE HCL ER 20 MG PO TBCR: EXTENDED_RELEASE_TABLET | ORAL | 0 refills | Status: DC

## 2021-04-21 NOTE — Telephone Encounter (Signed)
Ritalin scripts refilled 

## 2021-04-21 NOTE — Telephone Encounter (Signed)
Patient was last seen 03/03/2021. Please advise on refills.

## 2021-04-23 ENCOUNTER — Telehealth: Payer: Self-pay | Admitting: Internal Medicine

## 2021-04-23 NOTE — Telephone Encounter (Signed)
Please advise on Ambien refill. Last OV was 03/03/21.

## 2021-04-24 MED ORDER — ZOLPIDEM TARTRATE ER 12.5 MG PO TBCR: 12.5000 mg | EXTENDED_RELEASE_TABLET | Freq: Every evening | ORAL | 1 refills | Status: DC | PRN

## 2021-04-24 NOTE — Telephone Encounter (Signed)
Ambien refilled

## 2021-06-11 DIAGNOSIS — M25611 Stiffness of right shoulder, not elsewhere classified: Secondary | ICD-10-CM | POA: Diagnosis not present

## 2021-06-11 DIAGNOSIS — R531 Weakness: Secondary | ICD-10-CM | POA: Diagnosis not present

## 2021-06-12 ENCOUNTER — Telehealth: Payer: Self-pay | Admitting: Internal Medicine

## 2021-06-12 MED ORDER — METHYLPHENIDATE HCL ER 20 MG PO TBCR: EXTENDED_RELEASE_TABLET | ORAL | 0 refills | Status: DC

## 2021-06-12 MED ORDER — METHYLPHENIDATE HCL 10 MG PO TABS: 10.0000 mg | ORAL_TABLET | Freq: Three times a day (TID) | ORAL | 0 refills | Status: DC

## 2021-06-12 NOTE — Telephone Encounter (Signed)
Called and spoke with pt letting her know that TP sent her Ritalin to pharmacy for her and she verbalized understanding. Nothing further needed.

## 2021-06-12 NOTE — Telephone Encounter (Signed)
Called and spoke with pt who states that she has completely run out of his Ritalin. Stated to her that CY is out of the office until Monday.  Pt wants to know if this could be approved by another provider could refill this once or even send in enough just to hold her over until CY returns Monday back at the office. Tammy, please advise.

## 2021-06-12 NOTE — Telephone Encounter (Signed)
Was In office recently ,  Note says Ritalin Three times a day  and Methylphenidate ER 20mg  Twice daily  .  PMP reviewed  Will send a 1 x rx only .

## 2021-06-18 DIAGNOSIS — R531 Weakness: Secondary | ICD-10-CM | POA: Diagnosis not present

## 2021-06-18 DIAGNOSIS — M25611 Stiffness of right shoulder, not elsewhere classified: Secondary | ICD-10-CM | POA: Diagnosis not present

## 2021-06-24 DIAGNOSIS — M25611 Stiffness of right shoulder, not elsewhere classified: Secondary | ICD-10-CM | POA: Diagnosis not present

## 2021-06-24 DIAGNOSIS — R531 Weakness: Secondary | ICD-10-CM | POA: Diagnosis not present

## 2021-07-02 DIAGNOSIS — M25611 Stiffness of right shoulder, not elsewhere classified: Secondary | ICD-10-CM | POA: Diagnosis not present

## 2021-07-02 DIAGNOSIS — R531 Weakness: Secondary | ICD-10-CM | POA: Diagnosis not present

## 2021-07-08 DIAGNOSIS — R531 Weakness: Secondary | ICD-10-CM | POA: Diagnosis not present

## 2021-07-08 DIAGNOSIS — M25611 Stiffness of right shoulder, not elsewhere classified: Secondary | ICD-10-CM | POA: Diagnosis not present

## 2021-07-16 DIAGNOSIS — M25611 Stiffness of right shoulder, not elsewhere classified: Secondary | ICD-10-CM | POA: Diagnosis not present

## 2021-07-16 DIAGNOSIS — R531 Weakness: Secondary | ICD-10-CM | POA: Diagnosis not present

## 2021-07-28 ENCOUNTER — Telehealth: Payer: Self-pay | Admitting: Internal Medicine

## 2021-07-29 MED ORDER — METHYLPHENIDATE HCL 10 MG PO TABS: 10.0000 mg | ORAL_TABLET | Freq: Three times a day (TID) | ORAL | 0 refills | Status: DC

## 2021-07-29 MED ORDER — ZOLPIDEM TARTRATE ER 12.5 MG PO TBCR: 12.5000 mg | EXTENDED_RELEASE_TABLET | Freq: Every evening | ORAL | 1 refills | Status: DC | PRN

## 2021-07-29 MED ORDER — METHYLPHENIDATE HCL ER 20 MG PO TBCR: EXTENDED_RELEASE_TABLET | ORAL | 0 refills | Status: DC

## 2021-07-29 NOTE — Telephone Encounter (Signed)
Patient is aware of below message and voiced her understanding.  Nothing further needed.   

## 2021-07-29 NOTE — Telephone Encounter (Signed)
Ambien and both ritalin scripts refilled ?

## 2021-07-29 NOTE — Telephone Encounter (Signed)
Spoke to patient. She is requesting a refill on Ambien, Metadate 20mg  and Ritalin 10mg . Last refilled 06/12/2021. ?Last OV 03/03/2021 with pending OV 03/02/2022. ?Preferred pharmacy [facility] ? ?Dr. Annamaria Boots, please advise. Thanks ? ? ? ?Current Outpatient Medications on File Prior to Visit  ?Medication Sig Dispense Refill  ? atorvastatin (LIPITOR) 20 MG tablet Take 20 mg by mouth daily.    ? cetirizine (ZYRTEC) 10 MG tablet Take 10 mg by mouth daily.    ? cyclobenzaprine (FLEXERIL) 5 MG tablet Take 1 tablet (5 mg total) by mouth as needed. 30 tablet 0  ? methylphenidate (METADATE ER) 20 MG ER tablet Twice daily as needed 60 tablet 0  ? methylphenidate (RITALIN) 10 MG tablet Take 1 tablet (10 mg total) by mouth 3 (three) times daily. 90 tablet 0  ? naproxen (NAPROSYN) 500 MG tablet Take 1 tablet by mouth as directed.  2  ? nebivolol (BYSTOLIC) 5 MG tablet Take 5 mg by mouth daily.    ? Omega-3 Fatty Acids (FISH OIL CONCENTRATE PO) Take by mouth daily.    ? RELPAX 40 MG tablet   1  ? telmisartan (MICARDIS) 40 MG tablet Take 1 tablet (40 mg total) by mouth daily. 90 tablet 3  ? triamcinolone (NASACORT) 55 MCG/ACT AERO nasal inhaler Place 2 sprays into the nose daily.    ? zolpidem (AMBIEN CR) 12.5 MG CR tablet Take 1 tablet (12.5 mg total) by mouth at bedtime as needed. 90 tablet 1  ? ?No current facility-administered medications on file prior to visit.  ? ? ?Allergies  ?Allergen Reactions  ? Amlodipine Besylate   ?  Dizziness, disequilibrium, fatigue  ? Armodafinil   ?  REACTION: headaches  ? Atorvastatin   ?  fatigue  ? Etodolac Itching  ? Hydrochlorothiazide   ?  Fatigue/ laziness  ? Vicodin [Hydrocodone-Acetaminophen] Itching  ? ? ?

## 2021-07-30 DIAGNOSIS — M25511 Pain in right shoulder: Secondary | ICD-10-CM | POA: Diagnosis not present

## 2021-08-28 DIAGNOSIS — R053 Chronic cough: Secondary | ICD-10-CM | POA: Diagnosis not present

## 2021-08-28 DIAGNOSIS — Z0000 Encounter for general adult medical examination without abnormal findings: Secondary | ICD-10-CM | POA: Diagnosis not present

## 2021-08-28 DIAGNOSIS — E7800 Pure hypercholesterolemia, unspecified: Secondary | ICD-10-CM | POA: Diagnosis not present

## 2021-08-28 DIAGNOSIS — G43909 Migraine, unspecified, not intractable, without status migrainosus: Secondary | ICD-10-CM | POA: Diagnosis not present

## 2021-08-28 DIAGNOSIS — I10 Essential (primary) hypertension: Secondary | ICD-10-CM | POA: Diagnosis not present

## 2021-09-16 ENCOUNTER — Other Ambulatory Visit: Payer: Self-pay | Admitting: Internal Medicine

## 2021-09-16 NOTE — Telephone Encounter (Signed)
Please advise on refill. Last OV was 10/22. She has a follow up on 10/23. ?

## 2021-09-17 MED ORDER — METHYLPHENIDATE HCL 10 MG PO TABS: 10.0000 mg | ORAL_TABLET | Freq: Three times a day (TID) | ORAL | 0 refills | Status: DC

## 2021-09-17 MED ORDER — METHYLPHENIDATE HCL ER 20 MG PO TBCR: EXTENDED_RELEASE_TABLET | ORAL | 0 refills | Status: DC

## 2021-09-17 NOTE — Telephone Encounter (Signed)
Methylphenidate scripts refilled 

## 2021-10-12 IMAGING — MR MR SHOULDER*R* W/O CM
4 of 5 series · 21 of 40 positions shown · non-contrast
Comparison: None.

CLINICAL DATA: Patient complains of right shoulder pain x a few
months. Patient reports previous steroid injection 6 weeks ago with
minimal relief. No known injury.

EXAM:
MRI OF THE RIGHT SHOULDER WITHOUT CONTRAST
TECHNIQUE: Multiplanar, multisequence MR imaging of the shoulder was performed.
No intravenous contrast was administered.

[Series 11: T2 fat-sat · axial · right · 3.0mm · 0.47mm/px · z∈[-38,+63]mm · 8 of 27 slices shown (1 of 3)]
[im 1/27]
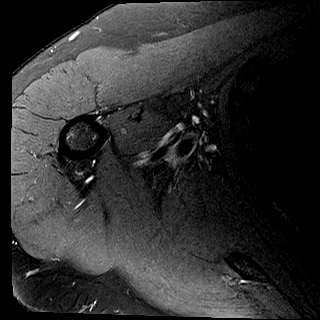
[im 3/27]
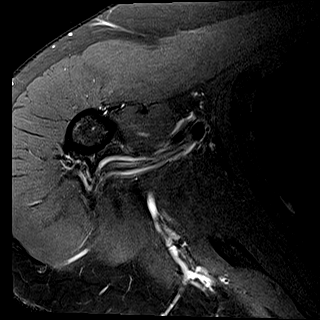
[im 9/27]
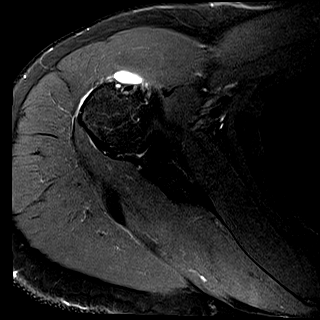
[im 12/27]
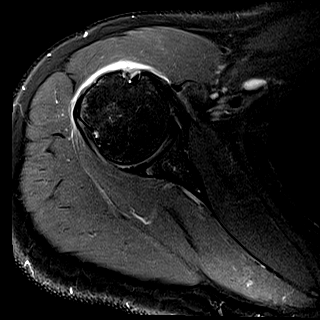
[im 15/27]
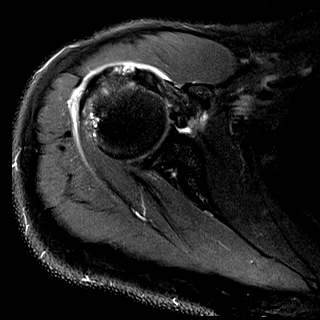
[im 18/27]
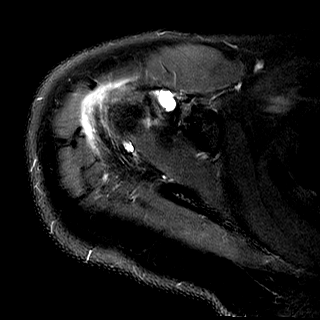
[im 24/27]
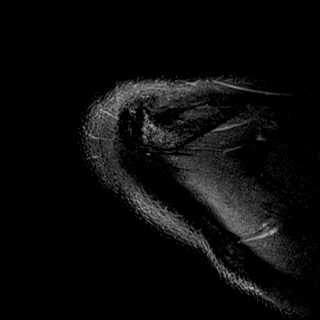
[im 27/27]
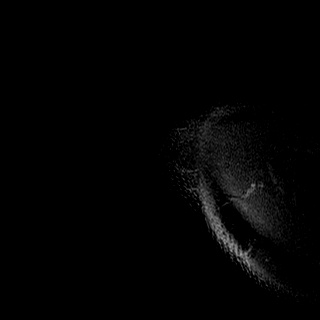

[Series 12: T2 fat-sat · oblique · right · 4.0mm · 0.22mm/px · 3 of 21 slices shown (2 of 3)]
[im 4/21]
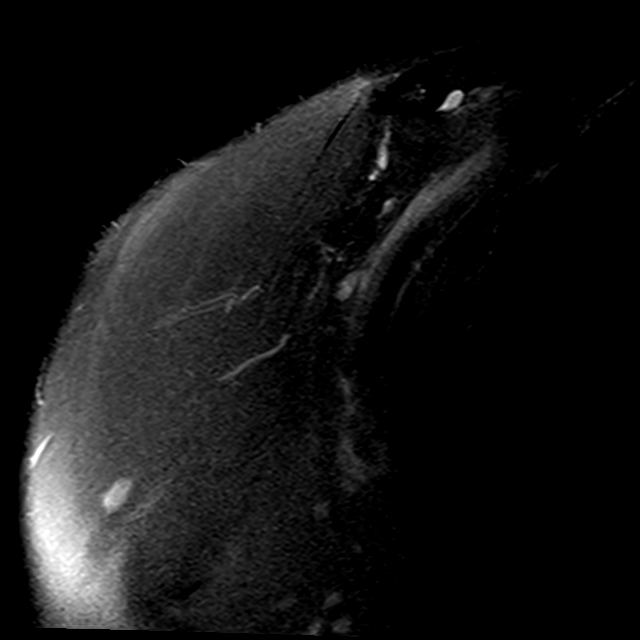
[im 11/21]
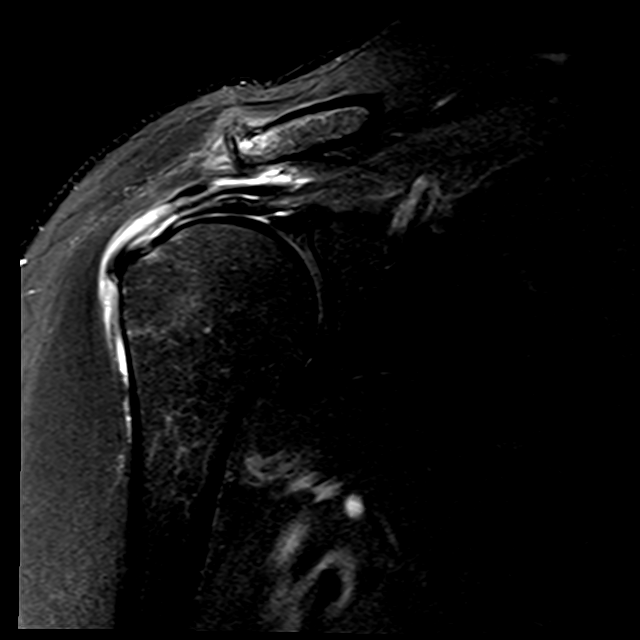
[im 17/21]
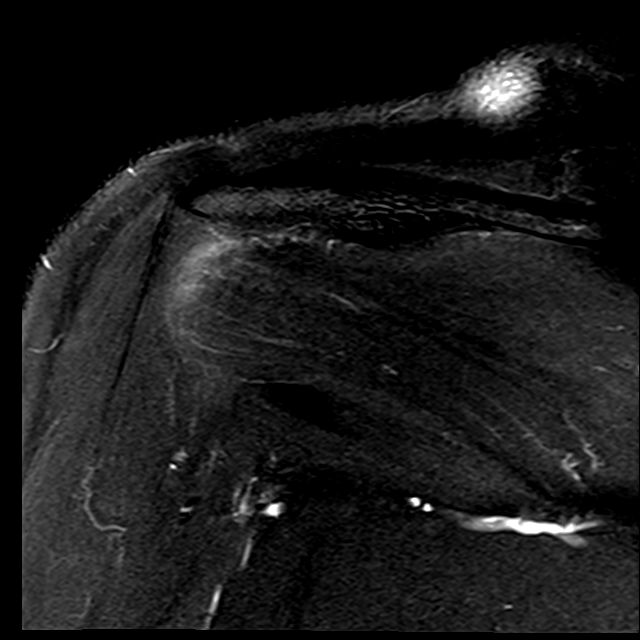

[Series 13: PD · oblique · right · 4.0mm · 0.22mm/px · 7 of 21 slices shown]
[im 1/21]
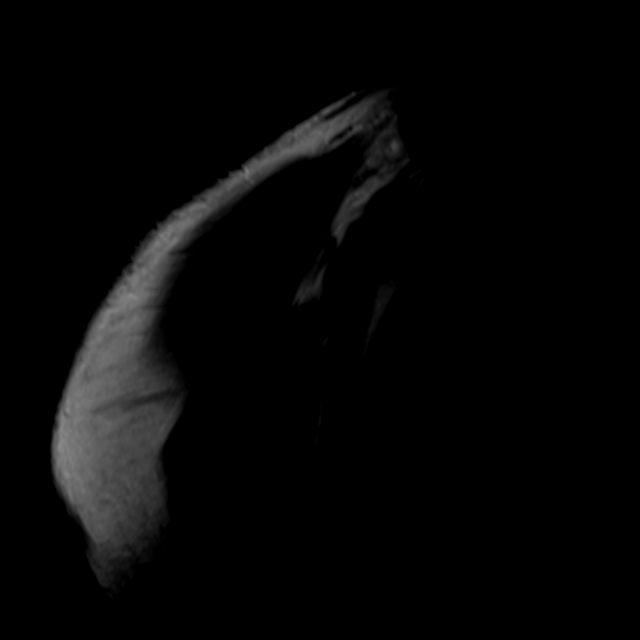
[im 4/21]
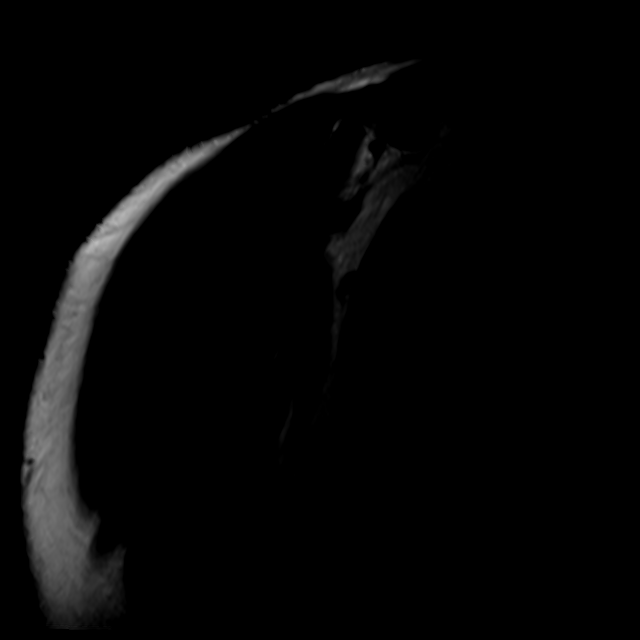
[im 7/21]
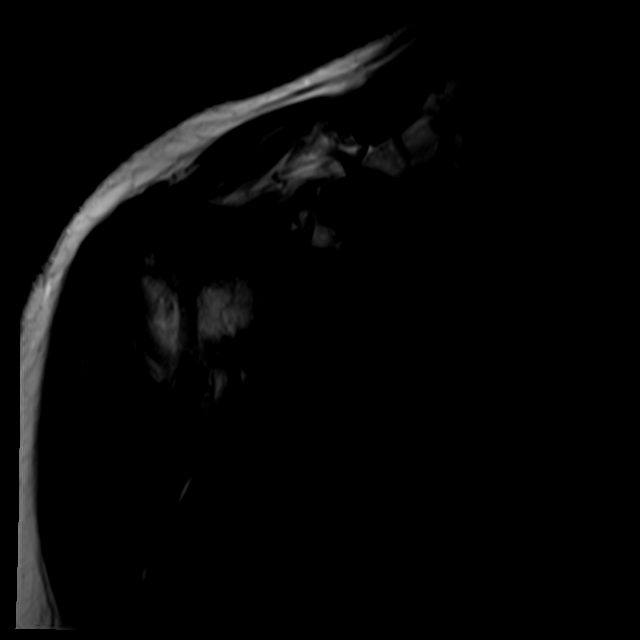
[im 11/21]
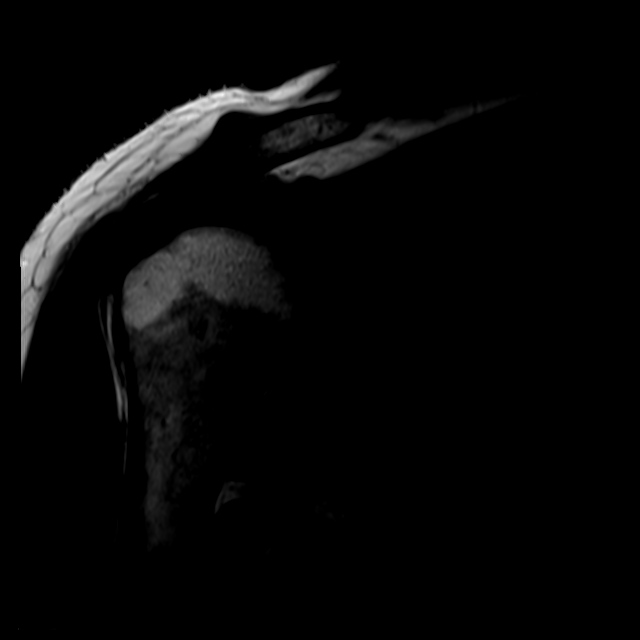
[im 14/21]
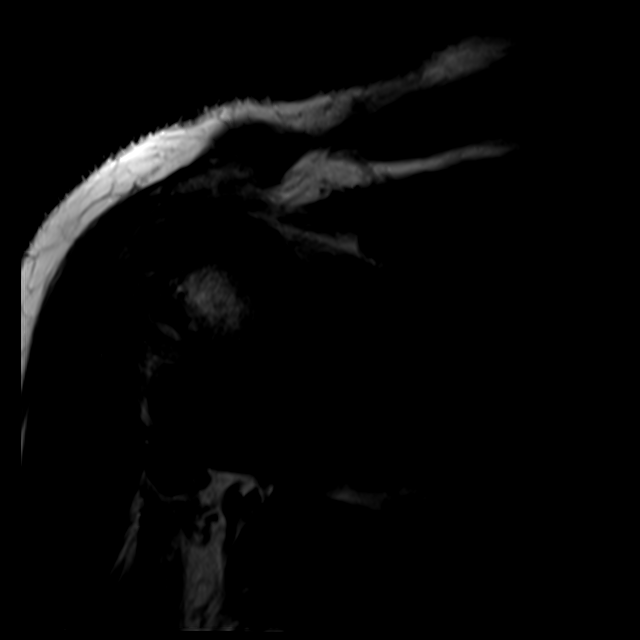
[im 17/21]
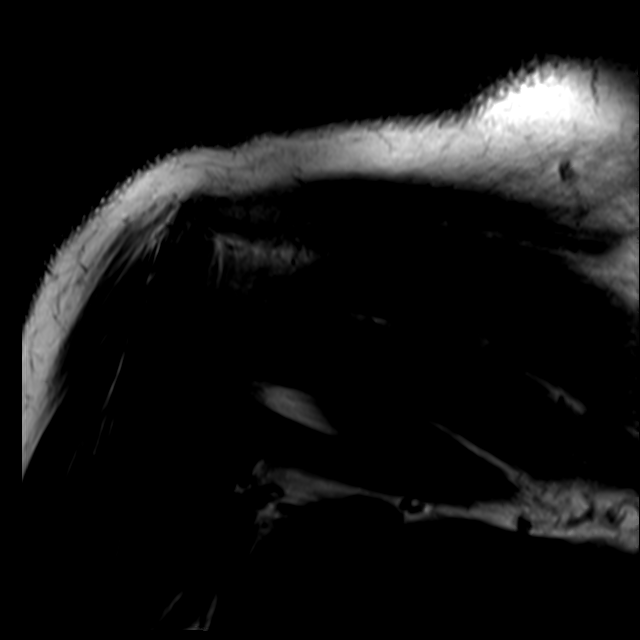
[im 21/21]
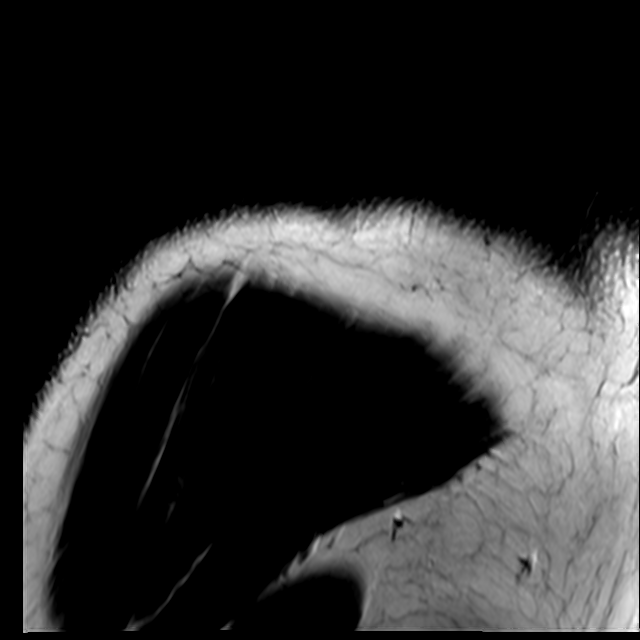

[Series 14: T2 fat-sat · oblique · right · 4.0mm · 0.44mm/px · 3 of 23 slices shown (3 of 3)]
[im 4/23]
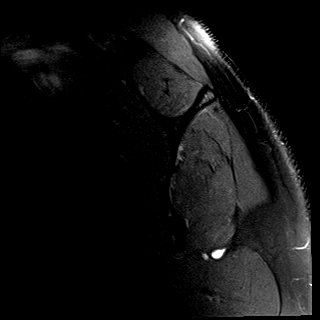
[im 13/23]
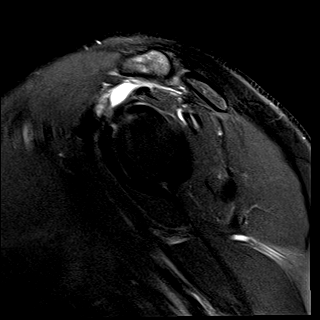
[im 19/23]
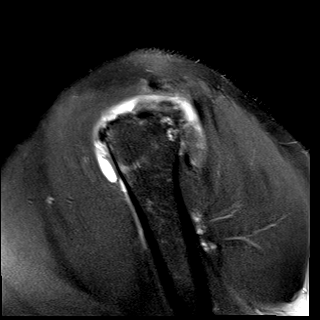

[21 of 40 positions shown; findings below may reference images not displayed]

FINDINGS: Rotator cuff: Full-thickness tear of the anterior to mid
supraspinatus tendon insertion with up to 11 mm of tendon
retraction. Tear measures 13 mm in AP dimension. Tendinosis of the
supraspinatus, infraspinatus, and subscapularis tendons. No
additional tears. Teres minor intact.

Muscles: Preserved bulk and signal intensity of the rotator cuff
musculature without edema, atrophy, or fatty infiltration.

Biceps long head:  Intact.

Acromioclavicular Joint: Mild-moderate arthropathy of the AC joint.
Mild-moderate volume subacromial-subdeltoid bursal fluid.

Glenohumeral Joint: No joint effusion. No chondral defect.

Labrum: Grossly intact, but evaluation is limited by lack of
intraarticular fluid. No paralabral cyst.

Bones: Reactive subchondral marrow signal changes centered at the AC
joint. No fracture. No malalignment. No suspicious bone lesion.

Other: None.
IMPRESSION: 1. Rotator cuff tendinosis with a full-thickness mildly retracted
tear of the anterior to mid supraspinatus tendon.
2. Subacromial-subdeltoid bursitis.
3. Mild-to-moderate AC joint arthropathy.

## 2021-10-30 ENCOUNTER — Telehealth: Payer: Self-pay | Admitting: Internal Medicine

## 2021-10-31 MED ORDER — METHYLPHENIDATE HCL 10 MG PO TABS: 10.0000 mg | ORAL_TABLET | Freq: Three times a day (TID) | ORAL | 0 refills | Status: DC

## 2021-10-31 MED ORDER — METHYLPHENIDATE HCL ER 20 MG PO TBCR: EXTENDED_RELEASE_TABLET | ORAL | 0 refills | Status: DC

## 2021-10-31 NOTE — Telephone Encounter (Signed)
Dr. Maple Hudson, please advise if you are okay refilling [t's methylphenidate 10mg  and 20mg . Preferred pharmacy is [facility].    Allergies  Allergen Reactions   Amlodipine Besylate     Dizziness, disequilibrium, fatigue   Armodafinil     REACTION: headaches   Etodolac Itching   Hydrochlorothiazide     Fatigue/ laziness   Vicodin [Hydrocodone-Acetaminophen] Itching   Atorvastatin     fatigue     Current Outpatient Medications:    atorvastatin (LIPITOR) 20 MG tablet, Take 20 mg by mouth daily., Disp: , Rfl:    cetirizine (ZYRTEC) 10 MG tablet, Take 10 mg by mouth daily., Disp: , Rfl:    cyclobenzaprine (FLEXERIL) 5 MG tablet, Take 1 tablet (5 mg total) by mouth as needed., Disp: 30 tablet, Rfl: 0   methylphenidate (METADATE ER) 20 MG ER tablet, Twice daily as needed, Disp: 60 tablet, Rfl: 0   methylphenidate (RITALIN) 10 MG tablet, Take 1 tablet (10 mg total) by mouth 3 (three) times daily., Disp: 90 tablet, Rfl: 0   naproxen (NAPROSYN) 500 MG tablet, Take 1 tablet by mouth as directed., Disp: , Rfl: 2   nebivolol (BYSTOLIC) 5 MG tablet, Take 5 mg by mouth daily., Disp: , Rfl:    Omega-3 Fatty Acids (FISH OIL CONCENTRATE PO), Take by mouth daily., Disp: , Rfl:    RELPAX 40 MG tablet, , Disp: , Rfl: 1   telmisartan (MICARDIS) 40 MG tablet, Take 1 tablet (40 mg total) by mouth daily., Disp: 90 tablet, Rfl: 3   triamcinolone (NASACORT) 55 MCG/ACT AERO nasal inhaler, Place 2 sprays into the nose daily., Disp: , Rfl:    zolpidem (AMBIEN CR) 12.5 MG CR tablet, Take 1 tablet (12.5 mg total) by mouth at bedtime as needed., Disp: 90 tablet, Rfl: 1

## 2021-10-31 NOTE — Telephone Encounter (Signed)
Called and spoke with pt letting her know that [facility] refilled both meds for her and she verbalized understanding. Nothing further needed.

## 2021-10-31 NOTE — Telephone Encounter (Signed)
Ritalin scripts refilled 

## 2021-11-03 ENCOUNTER — Emergency Department (HOSPITAL_COMMUNITY): Payer: BLUE CROSS/BLUE SHIELD

## 2021-11-03 ENCOUNTER — Other Ambulatory Visit: Payer: Self-pay

## 2021-11-03 ENCOUNTER — Inpatient Hospital Stay (HOSPITAL_COMMUNITY): Payer: BLUE CROSS/BLUE SHIELD

## 2021-11-03 ENCOUNTER — Inpatient Hospital Stay (HOSPITAL_COMMUNITY)
Admission: EM | Admit: 2021-11-03 | Discharge: 2021-11-08 | DRG: 064 | Disposition: A | Discharge status: Rehabilitation Facility | Payer: BLUE CROSS/BLUE SHIELD | Attending: Internal Medicine | Admitting: Internal Medicine

## 2021-11-03 DIAGNOSIS — Z888 Allergy status to other drugs, medicaments and biological substances status: Secondary | ICD-10-CM

## 2021-11-03 DIAGNOSIS — G936 Cerebral edema: Secondary | ICD-10-CM | POA: Diagnosis not present

## 2021-11-03 DIAGNOSIS — Z833 Family history of diabetes mellitus: Secondary | ICD-10-CM

## 2021-11-03 DIAGNOSIS — R220 Localized swelling, mass and lump, head: Secondary | ICD-10-CM | POA: Diagnosis not present

## 2021-11-03 DIAGNOSIS — R29810 Facial weakness: Secondary | ICD-10-CM | POA: Diagnosis present

## 2021-11-03 DIAGNOSIS — R4701 Aphasia: Secondary | ICD-10-CM | POA: Diagnosis not present

## 2021-11-03 DIAGNOSIS — Z8249 Family history of ischemic heart disease and other diseases of the circulatory system: Secondary | ICD-10-CM

## 2021-11-03 DIAGNOSIS — F09 Unspecified mental disorder due to known physiological condition: Secondary | ICD-10-CM | POA: Diagnosis not present

## 2021-11-03 DIAGNOSIS — I615 Nontraumatic intracerebral hemorrhage, intraventricular: Secondary | ICD-10-CM | POA: Diagnosis present

## 2021-11-03 DIAGNOSIS — R531 Weakness: Secondary | ICD-10-CM | POA: Diagnosis not present

## 2021-11-03 DIAGNOSIS — Z885 Allergy status to narcotic agent status: Secondary | ICD-10-CM | POA: Diagnosis not present

## 2021-11-03 DIAGNOSIS — I161 Hypertensive emergency: Secondary | ICD-10-CM | POA: Diagnosis not present

## 2021-11-03 DIAGNOSIS — Z825 Family history of asthma and other chronic lower respiratory diseases: Secondary | ICD-10-CM

## 2021-11-03 DIAGNOSIS — G43909 Migraine, unspecified, not intractable, without status migrainosus: Secondary | ICD-10-CM | POA: Diagnosis not present

## 2021-11-03 DIAGNOSIS — Z79899 Other long term (current) drug therapy: Secondary | ICD-10-CM

## 2021-11-03 DIAGNOSIS — R200 Anesthesia of skin: Secondary | ICD-10-CM | POA: Diagnosis not present

## 2021-11-03 DIAGNOSIS — G8191 Hemiplegia, unspecified affecting right dominant side: Secondary | ICD-10-CM | POA: Diagnosis present

## 2021-11-03 DIAGNOSIS — G47419 Narcolepsy without cataplexy: Secondary | ICD-10-CM | POA: Diagnosis not present

## 2021-11-03 DIAGNOSIS — I6389 Other cerebral infarction: Secondary | ICD-10-CM | POA: Diagnosis not present

## 2021-11-03 DIAGNOSIS — R4781 Slurred speech: Secondary | ICD-10-CM | POA: Diagnosis not present

## 2021-11-03 DIAGNOSIS — R404 Transient alteration of awareness: Secondary | ICD-10-CM | POA: Diagnosis not present

## 2021-11-03 DIAGNOSIS — H832X3 Labyrinthine dysfunction, bilateral: Secondary | ICD-10-CM | POA: Diagnosis not present

## 2021-11-03 DIAGNOSIS — Z8673 Personal history of transient ischemic attack (TIA), and cerebral infarction without residual deficits: Secondary | ICD-10-CM

## 2021-11-03 DIAGNOSIS — I10 Essential (primary) hypertension: Secondary | ICD-10-CM | POA: Diagnosis not present

## 2021-11-03 DIAGNOSIS — Z803 Family history of malignant neoplasm of breast: Secondary | ICD-10-CM | POA: Diagnosis not present

## 2021-11-03 DIAGNOSIS — Z7951 Long term (current) use of inhaled steroids: Secondary | ICD-10-CM

## 2021-11-03 DIAGNOSIS — I69151 Hemiplegia and hemiparesis following nontraumatic intracerebral hemorrhage affecting right dominant side: Secondary | ICD-10-CM | POA: Diagnosis not present

## 2021-11-03 DIAGNOSIS — I619 Nontraumatic intracerebral hemorrhage, unspecified: Secondary | ICD-10-CM | POA: Diagnosis not present

## 2021-11-03 DIAGNOSIS — R42 Dizziness and giddiness: Secondary | ICD-10-CM | POA: Diagnosis not present

## 2021-11-03 DIAGNOSIS — E785 Hyperlipidemia, unspecified: Secondary | ICD-10-CM | POA: Diagnosis not present

## 2021-11-03 DIAGNOSIS — I610 Nontraumatic intracerebral hemorrhage in hemisphere, subcortical: Secondary | ICD-10-CM | POA: Diagnosis not present

## 2021-11-03 DIAGNOSIS — I611 Nontraumatic intracerebral hemorrhage in hemisphere, cortical: Secondary | ICD-10-CM | POA: Diagnosis not present

## 2021-11-03 DIAGNOSIS — G43809 Other migraine, not intractable, without status migrainosus: Secondary | ICD-10-CM | POA: Diagnosis not present

## 2021-11-03 DIAGNOSIS — R29715 NIHSS score 15: Secondary | ICD-10-CM | POA: Diagnosis present

## 2021-11-03 DIAGNOSIS — R414 Neurologic neglect syndrome: Secondary | ICD-10-CM | POA: Diagnosis not present

## 2021-11-03 DIAGNOSIS — H818X9 Other disorders of vestibular function, unspecified ear: Secondary | ICD-10-CM | POA: Diagnosis not present

## 2021-11-03 DIAGNOSIS — E7800 Pure hypercholesterolemia, unspecified: Secondary | ICD-10-CM | POA: Diagnosis not present

## 2021-11-03 DIAGNOSIS — I69120 Aphasia following nontraumatic intracerebral hemorrhage: Secondary | ICD-10-CM | POA: Diagnosis not present

## 2021-11-03 DIAGNOSIS — R001 Bradycardia, unspecified: Secondary | ICD-10-CM | POA: Diagnosis not present

## 2021-11-03 DIAGNOSIS — F5104 Psychophysiologic insomnia: Secondary | ICD-10-CM | POA: Diagnosis not present

## 2021-11-03 DIAGNOSIS — I639 Cerebral infarction, unspecified: Secondary | ICD-10-CM | POA: Diagnosis not present

## 2021-11-03 DIAGNOSIS — R7989 Other specified abnormal findings of blood chemistry: Secondary | ICD-10-CM | POA: Diagnosis not present

## 2021-11-03 DIAGNOSIS — F909 Attention-deficit hyperactivity disorder, unspecified type: Secondary | ICD-10-CM | POA: Diagnosis not present

## 2021-11-03 LAB — LIPID PANEL
Cholesterol: 225 mg/dL — ABNORMAL HIGH (ref 0–200)
HDL: 71 mg/dL (ref >40)
LDL Cholesterol: 142 mg/dL — ABNORMAL HIGH (ref 0–99)
Total CHOL/HDL Ratio: 3.2 RATIO
Triglycerides: 59 mg/dL (ref <150)
VLDL: 12 mg/dL (ref 0–40)

## 2021-11-03 LAB — COMPREHENSIVE METABOLIC PANEL
ALT: 29 U/L (ref 0–44)
AST: 27 U/L (ref 15–41)
Albumin: 4.2 g/dL (ref 3.5–5.0)
Alkaline Phosphatase: 79 U/L (ref 38–126)
Anion gap: 8 (ref 5–15)
BUN: 18 mg/dL (ref 6–20)
CO2: 25 mmol/L (ref 22–32)
Calcium: 9.5 mg/dL (ref 8.9–10.3)
Chloride: 105 mmol/L (ref 98–111)
Creatinine, Ser: 1.15 mg/dL — ABNORMAL HIGH (ref 0.44–1.00)
GFR, Estimated: 58 mL/min — ABNORMAL LOW (ref >60)
Glucose, Bld: 96 mg/dL (ref 70–99)
Potassium: 3.6 mmol/L (ref 3.5–5.1)
Sodium: 138 mmol/L (ref 135–145)
Total Bilirubin: 0.8 mg/dL (ref 0.3–1.2)
Total Protein: 7.7 g/dL (ref 6.5–8.1)

## 2021-11-03 LAB — CBC
HCT: 42.6 % (ref 36.0–46.0)
Hemoglobin: 13.8 g/dL (ref 12.0–15.0)
MCH: 30 pg (ref 26.0–34.0)
MCHC: 32.4 g/dL (ref 30.0–36.0)
MCV: 92.6 fL (ref 80.0–100.0)
Platelets: 353 10*3/uL (ref 150–400)
RBC: 4.6 MIL/uL (ref 3.87–5.11)
RDW: 12.6 % (ref 11.5–15.5)
WBC: 6.1 10*3/uL (ref 4.0–10.5)
nRBC: 0 % (ref 0.0–0.2)

## 2021-11-03 LAB — DIFFERENTIAL
Abs Immature Granulocytes: 0.01 10*3/uL (ref 0.00–0.07)
Basophils Absolute: 0 10*3/uL (ref 0.0–0.1)
Basophils Relative: 1 %
Eosinophils Absolute: 0 10*3/uL (ref 0.0–0.5)
Eosinophils Relative: 1 %
Immature Granulocytes: 0 %
Lymphocytes Relative: 49 %
Lymphs Abs: 3.1 10*3/uL (ref 0.7–4.0)
Monocytes Absolute: 0.4 10*3/uL (ref 0.1–1.0)
Monocytes Relative: 7 %
Neutro Abs: 2.6 10*3/uL (ref 1.7–7.7)
Neutrophils Relative %: 42 %

## 2021-11-03 LAB — I-STAT BETA HCG BLOOD, ED (MC, WL, AP ONLY): I-stat hCG, quantitative: 6.4 m[IU]/mL — ABNORMAL HIGH (ref <5)

## 2021-11-03 LAB — HEMOGLOBIN A1C
Hgb A1c MFr Bld: 5.3 % (ref 4.8–5.6)
Mean Plasma Glucose: 105.41 mg/dL

## 2021-11-03 LAB — I-STAT CHEM 8, ED
BUN: 20 mg/dL (ref 6–20)
Calcium, Ion: 1.06 mmol/L — ABNORMAL LOW (ref 1.15–1.40)
Chloride: 104 mmol/L (ref 98–111)
Creatinine, Ser: 1.2 mg/dL — ABNORMAL HIGH (ref 0.44–1.00)
Glucose, Bld: 92 mg/dL (ref 70–99)
HCT: 43 % (ref 36.0–46.0)
Hemoglobin: 14.6 g/dL (ref 12.0–15.0)
Potassium: 3.5 mmol/L (ref 3.5–5.1)
Sodium: 138 mmol/L (ref 135–145)
TCO2: 23 mmol/L (ref 22–32)

## 2021-11-03 LAB — HIV ANTIBODY (ROUTINE TESTING W REFLEX): HIV Screen 4th Generation wRfx: NONREACTIVE

## 2021-11-03 LAB — MRSA NEXT GEN BY PCR, NASAL: MRSA by PCR Next Gen: NOT DETECTED

## 2021-11-03 LAB — APTT: aPTT: 31 seconds (ref 24–36)

## 2021-11-03 LAB — PROTIME-INR
INR: 1 (ref 0.8–1.2)
Prothrombin Time: 12.8 seconds (ref 11.4–15.2)

## 2021-11-03 LAB — ETHANOL: Alcohol, Ethyl (B): <10 mg/dL (ref <10)

## 2021-11-03 LAB — CBG MONITORING, ED: Glucose-Capillary: 94 mg/dL (ref 70–99)

## 2021-11-03 MED ORDER — STROKE: EARLY STAGES OF RECOVERY BOOK
Freq: Once | Status: AC
  Filled 2021-11-03: qty 1

## 2021-11-03 MED ORDER — SODIUM CHLORIDE 0.9% FLUSH: 3.0000 mL | Freq: Once | INTRAVENOUS | Status: DC

## 2021-11-03 MED ORDER — IOHEXOL 350 MG/ML SOLN
75.0000 mL | Freq: Once | INTRAVENOUS | Status: AC | PRN
  Administered 2021-11-03: 75 mL via INTRAVENOUS

## 2021-11-03 MED ORDER — ACETAMINOPHEN 160 MG/5ML PO SOLN: 650.0000 mg | ORAL | Status: DC | PRN

## 2021-11-03 MED ORDER — ACETAMINOPHEN 325 MG PO TABS
650.0000 mg | ORAL_TABLET | ORAL | Status: DC | PRN
  Administered 2021-11-04 (×2): 650 mg via ORAL
  Filled 2021-11-03 (×2): qty 2

## 2021-11-03 MED ORDER — PANTOPRAZOLE SODIUM 40 MG IV SOLR
40.0000 mg | Freq: Every day | INTRAVENOUS | Status: DC
  Administered 2021-11-03: 40 mg via INTRAVENOUS
  Filled 2021-11-03: qty 10

## 2021-11-03 MED ORDER — LABETALOL HCL 5 MG/ML IV SOLN: 20.0000 mg | Freq: Once | INTRAVENOUS | Status: DC

## 2021-11-03 MED ORDER — SENNOSIDES-DOCUSATE SODIUM 8.6-50 MG PO TABS
1.0000 | ORAL_TABLET | Freq: Two times a day (BID) | ORAL | Status: DC
  Administered 2021-11-05 – 2021-11-08 (×4): 1 via ORAL
  Filled 2021-11-03 (×4): qty 1

## 2021-11-03 MED ORDER — LABETALOL HCL 5 MG/ML IV SOLN
10.0000 mg | Freq: Once | INTRAVENOUS | Status: AC
  Administered 2021-11-03: 10 mg via INTRAVENOUS

## 2021-11-03 MED ORDER — CLEVIDIPINE BUTYRATE 0.5 MG/ML IV EMUL
0.0000 mg/h | INTRAVENOUS | Status: DC
  Administered 2021-11-03 (×2): 2 mg/h via INTRAVENOUS
  Administered 2021-11-04: 4 mg/h via INTRAVENOUS
  Filled 2021-11-03 (×4): qty 50

## 2021-11-03 MED ORDER — ACETAMINOPHEN 650 MG RE SUPP: 650.0000 mg | RECTAL | Status: DC | PRN

## 2021-11-03 MED ORDER — CLEVIDIPINE BUTYRATE 0.5 MG/ML IV EMUL: 0.0000 mg/h | INTRAVENOUS | Status: DC

## 2021-11-03 NOTE — Code Documentation (Signed)
Stroke Response Nurse Documentation Code Documentation  Denise Herrera is a 51 y.o. female arriving to [facility]  via [facility] on 11/03/2021 with past medical hx of HTN, Narcolepsy. Code stroke was activated by EMS.   Patient from home where she was LKW at 1330 and now complaining of right sided weakness and aphasia. Pt called her husband and reported that her right arm was starting to feel numb and she thought she was having a stroke. He called EMS and then the husband drove to the hospital. Upon arrival of EMS, pt was completely aphasic and flaccid on the right side.   Stroke team at the bedside on patient arrival. Labs drawn and patient cleared for CT by Dr. Jodi Mourning and Dr. Axel Filler. Patient to CT with team. NIHSS 24, see documentation for details and code stroke times. Patient with disoriented, not following commands, left gaze preference , right hemianopia, right facial droop, right arm weakness, right leg weakness, right decreased sensation, Global aphasia , dysarthria , and Visual  neglect on exam. The following imaging was completed:  CT Head and CTA. Patient is not a candidate for IV Thrombolytic due to ICH noted per provider. Patient is not a candidate for IR due to no LVO.   Care Plan: q1 NIHSS/VS/Pupils, BP goal 130-150.   Bedside handoff with ED RN Delorise Shiner.    Lucila Maine  Stroke Response RN

## 2021-11-03 NOTE — ED Triage Notes (Signed)
Pt bib [facility] as code stroke. Pt had sudden onset of R sided weakness and aphasia at work. LKW 1330.  BP 160/86, HR 84

## 2021-11-03 NOTE — H&P (Addendum)
Neurology H&P Referring physician : [place] Causey CC: Code stroke with aphasia and right sided weakness  History is obtained from:family, chart and EMS  HPI: Denise Herrera is a 51 y.o. female with a history of hypertension who was at work today and called her husband at 1330 complaining of right sided numbness.  Her co-workers say this was the last time they saw her in a normal state.  She then went to the bathroom at work, developed right sided weakness and aphasia and eventually was able to call for help.  On arrival to the ED, she has expressive aphasia but responds to her name and is able to follow commands.  RUE is flaccid and RLE has 2/5 strength.  She has no history of anticoagulant use.  Blood pressure was only mildly elevated at 141/85.   LKW: 1330 tpa given?: No, ICH IR Thrombectomy? No, ICH Modified Rankin Scale: 0-Completely asymptomatic and back to baseline post- stroke NIHSS: 15 ICH score 1  ROS:  Unable to obtain due to aphasia.   Past Medical History:  Diagnosis Date   Chronic insomnia    History of migraine headaches    Narcolepsy without cataplexy(347.00)      Family History  Problem Relation Age of Onset   Asthma Mother    Hypertension Mother    Cancer Mother        breast cancer   Hypertension Maternal Grandfather    Diabetes Maternal Grandfather    Hypertension Maternal Grandmother      Social History:  reports that she has never smoked. She has never used smokeless tobacco. She reports that she does not drink alcohol and does not use drugs.   Prior to Admission medications   Medication Sig Start Date End Date Taking? Authorizing Provider  atorvastatin (LIPITOR) 20 MG tablet Take 20 mg by mouth daily.    [provider]  cetirizine (ZYRTEC) 10 MG tablet Take 10 mg by mouth daily.    [provider]  cyclobenzaprine (FLEXERIL) 5 MG tablet Take 1 tablet (5 mg total) by mouth as needed. 03/08/19   Waymon Budge, MD  methylphenidate  (METADATE ER) 20 MG ER tablet Twice daily as needed 10/31/21   Jetty Duhamel D, MD  methylphenidate (RITALIN) 10 MG tablet Take 1 tablet (10 mg total) by mouth 3 (three) times daily. 10/31/21   Jetty Duhamel D, MD  naproxen (NAPROSYN) 500 MG tablet Take 1 tablet by mouth as directed. 12/22/16   [provider]  nebivolol (BYSTOLIC) 5 MG tablet Take 5 mg by mouth daily.    [provider]  Omega-3 Fatty Acids (FISH OIL CONCENTRATE PO) Take by mouth daily.    [provider]  RELPAX 40 MG tablet  01/01/15   [provider]  telmisartan (MICARDIS) 40 MG tablet Take 1 tablet (40 mg total) by mouth daily. 08/29/20   Hilty, Lisette Abu, MD  triamcinolone (NASACORT) 55 MCG/ACT AERO nasal inhaler Place 2 sprays into the nose daily.    [provider]  zolpidem (AMBIEN CR) 12.5 MG CR tablet Take 1 tablet (12.5 mg total) by mouth at bedtime as needed. 07/29/21   Waymon Budge, MD     Exam: Current vital signs: BP (!) 141/96   Pulse 64   Temp (!) 97.2 F (36.2 C) (Temporal)   Resp 15   Wt 76.6 kg   SpO2 100%   BMI 28.99 kg/m    Physical Exam  Constitutional: Appears well-developed and well-nourished  middle-age African-American lady.  Psych: Affect appropriate to situation Eyes: No scleral injection HENT: No OP obstrucion Head: Normocephalic. Respiratory: Effort normal and breath sounds normal to anterior ascultation Skin: WDI  Neuro: Mental Status: Patient is awake, alert and responds to name Patient is not able to give a clear and coherent history due to aphasia Moderate expressive and mild receptive aphasia and dysarthria present Cranial Nerves: II: PERRL III,IV, VI: Left gaze preference but able to overcome V: Facial sensation is symmetric to light touch VII: Right sided facial droop VIII: hearing is intact to voice XII: tongue is midline without atrophy or fasciculations.  Motor: Tone is normal. Bulk is normal. 5/5 strength to LUE and  LLE, 0/5 to RUE and 2/5 to RLE Sensory: Sensation is symmetric to light touch and in the arms and legs. Plantars: Toes are downgoing bilaterally.    I have reviewed labs in epic and the pertinent results are: CBC    Component Value Date/Time   WBC 6.1 11/03/2021 1545   RBC 4.60 11/03/2021 1545   HGB 14.6 11/03/2021 1550   HCT 43.0 11/03/2021 1550   PLT 353 11/03/2021 1545   MCV 92.6 11/03/2021 1545   MCH 30.0 11/03/2021 1545   MCHC 32.4 11/03/2021 1545   RDW 12.6 11/03/2021 1545   LYMPHSABS 3.1 11/03/2021 1545   MONOABS 0.4 11/03/2021 1545   EOSABS 0.0 11/03/2021 1545   BASOSABS 0.0 11/03/2021 1545       Latest Ref Rng & Units 11/03/2021    3:50 PM  BMP  Glucose 70 - 99 mg/dL 92   BUN 6 - 20 mg/dL 20   Creatinine 8.67 - 1.00 mg/dL 6.19   Sodium 509 - 326 mmol/L 138   Potassium 3.5 - 5.1 mmol/L 3.5   Chloride 98 - 111 mmol/L 104      I have reviewed the images obtained:  Head CT: Acute IPH in left corona radiata with IVH, no midline shift  CTA head: No aneurysm, AVM or significant abnormality   Primary Diagnosis:  Nontraumatic intracerebral hemorrhage in hemisphere, subcortical  Secondary Diagnosis: Essential (primary) hypertension Aphasia Right hemiplegia History of narcolepsy   Impression: 51 year old patient with a history of hypertension who 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  ICH score 1.  It is likely that the ICH occurred while patient was straining to have a bowel movement.  Patient has no history of anticoagulant use.  She is aphasic on presentation with right sided weakness and a left gaze preference which she is able to overcome.  Plan: Admit to the ICU, preferably 4 North Tight control of blood pressure, SBP 130-150 for the first 24 hours then <160 Use Cleviprex gtt for control of blood pressure As well as as needed IV labetalol or hydralazine. No chemical DVT prophylaxis No antiplatelet medications PT/OT/SLP consults Resume home blood  pressure medications when she is able to swallow safely    Cortney E Ernestina Columbia , MSN, AGACNP-BC [facility] See Amion for schedule and pager information 11/03/2021 4:28 PM   STROKE MD NOTE :  I have personally obtained history,examined this patient, reviewed notes, independently viewed imaging studies, participated in medical decision making and plan of care.ROS completed by me personally and pertinent positives fully documented  I have made any additions or clarifications directly to the above note. Agree with note above.  Patient presented with sudden onset of aphasia and right hemiplegia due to left basal ganglia/thalamic capsular hemorrhage etiology likely uncontrolled  hypertension.  CT scan shows parenchymal hemorrhage with slight intraventricular extension but no hydrocephalus or significant midline shift.  Plan admit to intensive care unit and continue close neurological observation and strict blood pressure control with systolic goal between 130-150 for the first 24 hours and then below 160.  Check CT angiogram of the brain and MRI scan of the brain later.  Physical occupational speech therapy consults.  Resume home medications when she is able to swallow safely.  Check urine drug screen.  Long discussion with patient's husband at the bedside and answered questions.  Discussed with Dr. [place] Causey, ER physician. This patient is critically ill and at significant risk of neurological worsening, death and care requires constant monitoring of vital signs, hemodynamics,respiratory and cardiac monitoring, extensive review of multiple databases, frequent neurological assessment, discussion with family, other specialists and medical decision making of high complexity.I have made any additions or clarifications directly to the above note.This critical care time does not reflect procedure time, or teaching time or supervisory time of PA/NP/Med Resident etc but could involve care discussion  time.  I spent 50 minutes of neurocritical care time  in the care of  this patient.      Delia Heady, MD Medical Director [facility] Pager: [phone] 11/03/2021 5:30 PM

## 2021-11-03 NOTE — ED Notes (Signed)
CBG 67 - new patient override. POC sheet filled out.

## 2021-11-03 NOTE — ED Provider Notes (Addendum)
[facility] EMERGENCY DEPARTMENT Provider Note   CSN: [id number] Arrival date & time: 11/03/21  1539  An emergency department physician performed an initial assessment on this suspected stroke patient at 1543.  History  Chief Complaint  Patient presents with   Code Stroke    Denise Herrera is a 51 y.o. female.  51 year old female with a past medical history of hyperlipidemia, hypertension presents to the ED as a code stroke.  Per EMS, last known normal at 1:30 PM.  Patient suddenly developed acute onset of right-sided facial droop, right sided weakness and numbness, and slurred speech.  On EMS arrival, patient was slurring her words but able to follow some commands. However, en route her condition has steadily declined with her no longer following commands or speaking. Blood pressure 160/89 prior to arrival. She is not anti-coagulated or on anti-platelet therapy. No known history of stroke.  The history is provided by the EMS personnel.       Home Medications Prior to Admission medications   Medication Sig Start Date End Date Taking? Authorizing Provider  atorvastatin (LIPITOR) 20 MG tablet Take 20 mg by mouth daily.    [provider]  cetirizine (ZYRTEC) 10 MG tablet Take 10 mg by mouth daily.    [provider]  cyclobenzaprine (FLEXERIL) 5 MG tablet Take 1 tablet (5 mg total) by mouth as needed. 03/08/19   Waymon Budge, MD  methylphenidate (METADATE ER) 20 MG ER tablet Twice daily as needed 10/31/21   Jetty Duhamel D, MD  methylphenidate (RITALIN) 10 MG tablet Take 1 tablet (10 mg total) by mouth 3 (three) times daily. 10/31/21   Jetty Duhamel D, MD  naproxen (NAPROSYN) 500 MG tablet Take 1 tablet by mouth as directed. 12/22/16   [provider]  nebivolol (BYSTOLIC) 5 MG tablet Take 5 mg by mouth daily.    [provider]  Omega-3 Fatty Acids (FISH OIL CONCENTRATE PO) Take by mouth daily.    [provider]   RELPAX 40 MG tablet  01/01/15   [provider]  telmisartan (MICARDIS) 40 MG tablet Take 1 tablet (40 mg total) by mouth daily. 08/29/20   Hilty, Lisette Abu, MD  triamcinolone (NASACORT) 55 MCG/ACT AERO nasal inhaler Place 2 sprays into the nose daily.    [provider]  zolpidem (AMBIEN CR) 12.5 MG CR tablet Take 1 tablet (12.5 mg total) by mouth at bedtime as needed. 07/29/21   Waymon Budge, MD      Allergies    Amlodipine besylate, Armodafinil, Etodolac, Hydrochlorothiazide, Vicodin [hydrocodone-acetaminophen], and Atorvastatin    Review of Systems   Review of Systems  Unable to perform ROS: Acuity of condition    Physical Exam Updated Vital Signs BP (!) 141/85   Pulse 66   Temp (!) 97.2 F (36.2 C) (Temporal)   Resp 15   Ht 5\' 4"  (1.626 m)   Wt 76.6 kg   SpO2 100%   BMI 28.99 kg/m  Physical Exam Vitals and nursing note reviewed.  Constitutional:      General: She is in acute distress.     Appearance: Normal appearance. She is well-developed.  HENT:     Head: Normocephalic and atraumatic.  Eyes:     General:        Right eye: No discharge.        Left eye: No discharge.     Extraocular Movements: Extraocular movements intact.     Conjunctiva/sclera: Conjunctivae normal.  Comments: Anisocoria with right pupil 45mm and reactive, left pupil 1 mm and reactive. Intact EOM, able to cross midline  Cardiovascular:     Rate and Rhythm: Normal rate and regular rhythm.     Pulses: Normal pulses.     Heart sounds: No murmur heard. Pulmonary:     Effort: Pulmonary effort is normal. No respiratory distress.     Breath sounds: Normal breath sounds.  Abdominal:     General: There is distension.  Musculoskeletal:     Cervical back: Normal range of motion.  Neurological:     Mental Status: She is alert.     Cranial Nerves: Cranial nerve deficit present.     Motor: Weakness present.     Comments: Alert, aphasic with difficulty following commands. She  does have leftward gaze preference but is able to cross midline. Notable right lower facial droop at rest with some ability to activate. Right upper extremity is flaccid with 0/5 strength, no withdrawal to pain though does wince to painful stimuli. Right lower extremity strength 2/5. Left upper and lower extremity strength 5/5. She does exhibit slight right hemibody neglect with no response to confrontation on the right.      ED Results / Procedures / Treatments   Labs (all labs ordered are listed, but only abnormal results are displayed) Labs Reviewed  COMPREHENSIVE METABOLIC PANEL - Abnormal; Notable for the following components:      Result Value   Creatinine, Ser 1.15 (*)    GFR, Estimated 58 (*)    All other components within normal limits  I-STAT CHEM 8, ED - Abnormal; Notable for the following components:   Creatinine, Ser 1.20 (*)    Calcium, Ion 1.06 (*)    All other components within normal limits  I-STAT BETA HCG BLOOD, ED ([facility], [facility], [facility] ONLY) - Abnormal; Notable for the following components:   I-stat hCG, quantitative 6.4 (*)    All other components within normal limits  PROTIME-INR  APTT  CBC  DIFFERENTIAL  ETHANOL  HIV ANTIBODY (ROUTINE TESTING W REFLEX)  RAPID URINE DRUG SCREEN, HOSP PERFORMED  LIPID PANEL  HEMOGLOBIN A1C  CBG MONITORING, ED    EKG None  Radiology CT ANGIO HEAD NECK W WO CM (CODE STROKE)  Result Date: 11/03/2021 CLINICAL DATA:  Intracranial hemorrhage EXAM: CT ANGIOGRAPHY HEAD AND NECK TECHNIQUE: Multidetector CT imaging of the head and neck was performed using the standard protocol during bolus administration of intravenous contrast. Multiplanar CT image reconstructions and MIPs were obtained to evaluate the vascular anatomy. Carotid stenosis measurements (when applicable) are obtained utilizing NASCET criteria, using the distal internal carotid diameter as the denominator. RADIATION DOSE REDUCTION: This exam was performed according to the  departmental dose-optimization program which includes automated exposure control, adjustment of the mA and/or kV according to patient size and/or use of iterative reconstruction technique. CONTRAST:  75 mL Omnipaque 350 COMPARISON:  None Available. FINDINGS: CTA NECK Aortic arch: Great vessel origins are patent. Right carotid system: Patent.  No stenosis. Left carotid system: Patent.  No stenosis. Vertebral arteries: Patent and codominant.  No stenosis. Skeleton: Cervical spine degenerative changes. Other neck: Unremarkable. Upper chest: No apical lung mass. Review of the MIP images confirms the above findings CTA HEAD Anterior circulation: Intracranial internal carotid arteries are patent. Anterior and middle cerebral arteries are patent. Posterior circulation: Intracranial vertebral arteries, basilar artery, and posterior cerebral arteries are patent. A right posterior communicating artery is present. Venous sinuses: Patent as allowed by contrast bolus  timing. There is no abnormal enhancement in the area of hemorrhage. Review of the MIP images confirms the above findings IMPRESSION: No aneurysm, AVM, or other significant vascular abnormality. Electronically Signed   By: Macy Mis M.D.   On: 11/03/2021 16:09   CT HEAD CODE STROKE WO CONTRAST  Result Date: 11/03/2021 CLINICAL DATA:  Code stroke. EXAM: CT HEAD WITHOUT CONTRAST TECHNIQUE: Contiguous axial images were obtained from the base of the skull through the vertex without intravenous contrast. RADIATION DOSE REDUCTION: This exam was performed according to the departmental dose-optimization program which includes automated exposure control, adjustment of the mA and/or kV according to patient size and/or use of iterative reconstruction technique. COMPARISON:  None Available. FINDINGS: Brain: There is acute parenchymal hemorrhage involving the left corona radiata, caudate body, posterior internal capsule, and lateral thalamus with mild surrounding edema.  Hemorrhage measures about 2.1 x 2.7 x 3.2 cm. There is extension into the left lateral ventricle. Mass effect is mild. No hydrocephalus. Gray-white differentiation is preserved. Vascular: No hyperdense vessel. Skull: Unremarkable. Sinuses/Orbits: Right posterior ethmoid and right sphenoid mucosal thickening. Orbits are unremarkable. Other: Mastoid air cells are clear. IMPRESSION: Acute parenchymal hemorrhage in the left corona radiata and thalamocapsular region with intraventricular extension. No significant mass effect or hydrocephalus. These results were communicated to Dr. Leonie Man at 3:52 pm on 11/03/2021 by text page via the [facility] messaging system. Electronically Signed   By: Macy Mis M.D.   On: 11/03/2021 15:55    Procedures Procedures    Medications Ordered in ED Medications  sodium chloride flush (NS) 0.9 % injection 3 mL (has no administration in time range)   stroke: early stages of recovery book (has no administration in time range)  acetaminophen (TYLENOL) tablet 650 mg (has no administration in time range)    Or  acetaminophen (TYLENOL) 160 MG/5ML solution 650 mg (has no administration in time range)    Or  acetaminophen (TYLENOL) suppository 650 mg (has no administration in time range)  senna-docusate (Senokot-S) tablet 1 tablet (has no administration in time range)  pantoprazole (PROTONIX) injection 40 mg (has no administration in time range)  clevidipine (CLEVIPREX) infusion 0.5 mg/mL (1 mg/hr Intravenous Rate/Dose Change 11/03/21 1619)  iohexol (OMNIPAQUE) 350 MG/ML injection 75 mL (75 mLs Intravenous Contrast Given 11/03/21 1601)  labetalol (NORMODYNE) injection 10 mg (10 mg Intravenous Given 11/03/21 1550)    ED Course/ Medical Decision Making/ A&P                           Medical Decision Making Amount and/or Complexity of Data Reviewed Independent Historian: spouse and EMS External Data Reviewed: labs and notes. Labs: ordered. Radiology: ordered and independent  interpretation performed. ECG/medicine tests: ordered.  Risk Drug therapy requiring intensive monitoring for toxicity. Decision regarding hospitalization.   51 year old female with a history as above presents to the ED as a code stroke.  Last known normal at 1:30 PM, approximately 2 hours ago.  On arrival, patient has significant aphasia with notable right lower facial droop and complete flaccidity of the right hemibody.  She was maintaining her airway and in no respiratory distress.  Neurology was present on the patient's arrival and evaluated her at bedside. NIHSS 15. POC glucose 94.  Patient was taken emergently to CT where I reviewed the patient's images.  She was found to have an acute left-sided basal ganglia hemorrhage with intraventricular extension.  There is no significant mass effect or evidence of hydrocephalus.  CTA without obvious aneurysmal or vascular anomaly.  Patient is not anticoagulated.  At this time, we will plan to admit to the ICU for close neurologic checks and tight blood pressure control.  Plan for systolic blood pressure goal between 130 - 150, Cleviprex started in the ED. Plan of care was discussed with the husband who arrived at bedside.  At this time, care will be transferred to ICU team.  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

## 2021-11-04 ENCOUNTER — Inpatient Hospital Stay (HOSPITAL_COMMUNITY): Payer: BLUE CROSS/BLUE SHIELD

## 2021-11-04 DIAGNOSIS — I6389 Other cerebral infarction: Secondary | ICD-10-CM

## 2021-11-04 DIAGNOSIS — I610 Nontraumatic intracerebral hemorrhage in hemisphere, subcortical: Secondary | ICD-10-CM | POA: Diagnosis not present

## 2021-11-04 LAB — GLUCOSE, CAPILLARY: Glucose-Capillary: 67 mg/dL — ABNORMAL LOW (ref 70–99)

## 2021-11-04 LAB — ECHOCARDIOGRAM COMPLETE
Area-P 1/2: 2.64 cm2
Calc EF: 71 %
Height: 64 in
S' Lateral: 2.4 cm
Single Plane A2C EF: 76.6 %
Single Plane A4C EF: 65.3 %
Weight: 2701.96 oz

## 2021-11-04 MED ORDER — LABETALOL HCL 5 MG/ML IV SOLN
20.0000 mg | INTRAVENOUS | Status: DC | PRN
  Administered 2021-11-05: 20 mg via INTRAVENOUS
  Filled 2021-11-04: qty 4

## 2021-11-04 MED ORDER — CHLORHEXIDINE GLUCONATE CLOTH 2 % EX PADS
6.0000 | MEDICATED_PAD | Freq: Every day | CUTANEOUS | Status: DC
  Administered 2021-11-04 – 2021-11-08 (×4): 6 via TOPICAL

## 2021-11-04 MED ORDER — PANTOPRAZOLE SODIUM 40 MG PO TBEC
40.0000 mg | DELAYED_RELEASE_TABLET | Freq: Every day | ORAL | Status: DC
  Administered 2021-11-04 – 2021-11-08 (×5): 40 mg via ORAL
  Filled 2021-11-04 (×5): qty 1

## 2021-11-04 MED ORDER — SODIUM CHLORIDE (PF) 0.9 % IJ SOLN: 20.0000 mg | Freq: Four times a day (QID) | INTRAMUSCULAR | Status: DC | PRN

## 2021-11-04 MED ORDER — NEBIVOLOL HCL 5 MG PO TABS
5.0000 mg | ORAL_TABLET | Freq: Every day | ORAL | Status: DC
Start: 2021-11-04 — End: 2021-11-08
  Administered 2021-11-04 – 2021-11-08 (×5): 5 mg via ORAL
  Filled 2021-11-04 (×6): qty 1

## 2021-11-04 MED ORDER — HYDRALAZINE HCL 20 MG/ML IJ SOLN
20.0000 mg | Freq: Four times a day (QID) | INTRAMUSCULAR | Status: DC | PRN
  Administered 2021-11-04 – 2021-11-06 (×4): 20 mg via INTRAVENOUS
  Filled 2021-11-04 (×4): qty 1

## 2021-11-04 MED ORDER — ATORVASTATIN CALCIUM 10 MG PO TABS
20.0000 mg | ORAL_TABLET | Freq: Every day | ORAL | Status: DC
  Administered 2021-11-04 – 2021-11-08 (×5): 20 mg via ORAL
  Filled 2021-11-04 (×5): qty 2

## 2021-11-04 MED ORDER — ORAL CARE MOUTH RINSE: 15.0000 mL | OROMUCOSAL | Status: DC | PRN

## 2021-11-04 NOTE — Evaluation (Signed)
Physical Therapy Evaluation Patient Details Name: Denise Herrera MRN: [id number] DOB: 1971/05/11 Today's Date: 11/04/2021  History of Present Illness  Denise Herrera is a 51 y.o. female with a history of hypertension admitted with weakness and aphasia. 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 MRI 6/20: "Acute parenchymal hemorrhage with involvement of the left thalamus and adjacent white matter with intraventricular extension. Mild  edema and mass effect."  Clinical Impression  Patient presents with decreased mobility due to deficits listed in PT problem list.  Currently mod A of 2 for OOB to chair due to poor awareness of R side and inattention with hemiparesis.  She was previously working and independent living with spouse and son who reports can provide assistance at d/c.  Feel she will benefit from skilled PT in the acute setting and from follow up acute inpatient rehab prior to d/c home with assist.        Recommendations for follow up therapy are one component of a multi-disciplinary discharge planning process, led by the attending physician.  Recommendations may be updated based on patient status, additional functional criteria and insurance authorization.  Follow Up Recommendations Acute inpatient rehab (3hours/day)      Assistance Recommended at Discharge Frequent or constant Supervision/Assistance  Patient can return home with the following  Two people to help with walking and/or transfers;Assistance with cooking/housework;A lot of help with bathing/dressing/bathroom;Assist for transportation;Direct supervision/assist for medications management;Help with stairs or ramp for entrance    Equipment Recommendations Other (comment) (TBA)  Recommendations for Other Services       Functional Status Assessment Patient has had a recent decline in their functional status and demonstrates the ability to make significant improvements in function in a reasonable and predictable amount of time.     Precautions  / Restrictions Precautions Precautions: Fall Precaution Comments: R hemiparesis, R inattention      Mobility  Bed Mobility Overal bed mobility: Needs Assistance             General bed mobility comments: up on EOB with OT upon PT entry    Transfers Overall transfer level: Needs assistance Equipment used: Rolling walker (2 wheels) Transfers: Sit to/from Stand, Bed to chair/wheelchair/BSC Sit to Stand: Min assist, +2 physical assistance   Step pivot transfers: Mod assist, +2 physical assistance       General transfer comment: stood with A for balance and R UE and LE positioning; assist to step to recliner with RW and A for R LE progression and stance stabilization in stance + hyperextension of knee when blocked to prevent buckling    Ambulation/Gait                  Stairs            Wheelchair Mobility    Modified Rankin (Stroke Patients Only) Modified Rankin (Stroke Patients Only) Pre-Morbid Rankin Score: No symptoms Modified Rankin: Severe disability     Balance Overall balance assessment: Needs assistance   Sitting balance-Leahy Scale: Poor Sitting balance - Comments: min A with L lateral lean Postural control: Left lateral lean Standing balance support: During functional activity, Bilateral upper extremity supported Standing balance-Leahy Scale: Poor Standing balance comment: poor awareness of R side and deficits                             Pertinent Vitals/Pain Pain Assessment Faces Pain Scale: No hurt    Home Living Family/patient expects to be discharged  to:: Private residence Living Arrangements: Spouse/significant other;Children Available Help at Discharge: Family Type of Home: House Home Access: Stairs to enter Entrance Stairs-Rails: None Entrance Stairs-Number of Steps: 3 Alternate Level Stairs-Number of Steps: flight Home Layout: Two level;Bed/bath upstairs;1/2 bath on main level Home Equipment: Agricultural consultant (2  wheels);Shower seat Additional Comments: Spouse works 4-12 pm, son lives with them also and can assist    Prior Function Prior Level of Function : Independent/Modified Independent;Working/employed;Driving             Mobility Comments: worked full time as Production designer, theatre/television/film at [employer]   Dominant Hand: Right    Extremity/Trunk Assessment   Upper Extremity Assessment Upper Extremity Assessment: LUE deficits/detail;RUE deficits/detail RUE Deficits / Details: trace movements noted at the hand, wrist and elbow. Sensory motor deficits. Difficult to fully assess RUE Sensation: decreased proprioception;decreased light touch RUE Coordination: decreased fine motor;decreased gross motor LUE Deficits / Details: seemingly WFL for ROM and MMT    Lower Extremity Assessment Lower Extremity Assessment: Defer to PT evaluation RLE Deficits / Details: able to lift antigravity, but functionally knee buckling with weight bearing forward or backward, needs assist to progress the leg RLE Sensation: decreased proprioception;decreased light touch RLE Coordination: decreased gross motor    Cervical / Trunk Assessment Cervical / Trunk Assessment: Normal  Communication   Communication: Expressive difficulties  Cognition Arousal/Alertness: Lethargic Behavior During Therapy: Flat affect, Impulsive Overall Cognitive Status: Impaired/Different from baseline Area of Impairment: Safety/judgement, Problem solving, Following commands, Attention                   Current Attention Level: Sustained   Following Commands: Follows one step commands with increased time Safety/Judgement: Decreased awareness of safety, Decreased awareness of deficits   Problem Solving: Slow processing, Requires verbal cues General Comments: R inattention        General Comments General comments (skin integrity, edema, etc.): VSS on RA; spoke to spouse for history    Exercises      Assessment/Plan    PT Assessment Patient needs continued PT services  PT Problem List Decreased strength;Decreased mobility;Decreased safety awareness;Impaired sensation;Decreased balance;Decreased cognition;Decreased coordination;Decreased knowledge of precautions       PT Treatment Interventions DME instruction;Therapeutic activities;Cognitive remediation;Patient/family education;Therapeutic exercise;Gait training;Balance training;Functional mobility training    PT Goals (Current goals can be found in the Care Plan section)  Acute Rehab PT Goals Patient Stated Goal: to return to independent PT Goal Formulation: With patient/family Time For Goal Achievement: 11/18/21 Potential to Achieve Goals: Good    Frequency Min 4X/week     Co-evaluation PT/OT/SLP Co-Evaluation/Treatment: Yes Reason for Co-Treatment: Complexity of the patient's impairments (multi-system involvement);For patient/therapist safety;To address functional/ADL transfers PT goals addressed during session: Mobility/safety with mobility;Balance OT goals addressed during session: ADL's and self-care       AM-PAC PT "6 Clicks" Mobility  Outcome Measure Help needed turning from your back to your side while in a flat bed without using bedrails?: A Lot Help needed moving from lying on your back to sitting on the side of a flat bed without using bedrails?: A Lot Help needed moving to and from a bed to a chair (including a wheelchair)?: Total Help needed standing up from a chair using your arms (e.g., wheelchair or bedside chair)?: Total Help needed to walk in hospital room?: Total Help needed climbing 3-5 steps with a railing? : Total 6 Click Score: 8    End of Session Equipment Utilized  During Treatment: Gait belt Activity Tolerance: Patient tolerated treatment well Patient left: in chair;with call bell/phone within reach;with chair alarm set   PT Visit Diagnosis: Other abnormalities of gait and mobility  (R26.89);Other symptoms and signs involving the nervous system (R29.898);Hemiplegia and hemiparesis Hemiplegia - Right/Left: Right Hemiplegia - dominant/non-dominant: Dominant Hemiplegia - caused by: Nontraumatic intracerebral hemorrhage    Time: 1440-1450 PT Time Calculation (min) (ACUTE ONLY): 10 min   Charges:   PT Evaluation $PT Eval Moderate Complexity: 1 Mod          Sheran Lawless, PT Acute Rehabilitation Services [phone] Office:[phone] 11/04/2021   Elray Mcgregor 11/04/2021, 4:49 PM

## 2021-11-04 NOTE — Evaluation (Signed)
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 female with a history of hypertension who developed R sided numbness at work followied by weakness and aphasia but was eventually able to call for help.  On arrival to the ED, she has expressive aphasia but responds to her name and is able to follow commands. 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 MRI 6/20: "Acute parenchymal hemorrhage with involvement of the left thalamus  and adjacent white matter with intraventricular extension. Mild  edema and mass effect."   Assessment / Plan / Recommendation Clinical Impression  Pt presents with a moderate expressive aphasia and mild receptive aphasia. Pt appears to have had significant improvement of symptoms already based on reported deficits in chart review, and RN endorses relatively rapid improvement in communication and motor ability. Pt's spontaneous speech is short and at times agrammatic, but picture description contained salient information.  Some semantic paraphasias noted ("swimming" for "fishing").   Pt completed confrontational naming task with 95% accuracy with self correction.  Semantic/phonemic paraphasias persisted ("wheelchair" immediately corrected to "walker"; and "straw" for  "spoon").  Pt was able to demonstrate use of objects and benefited from semantic cuing.  Repetition was relatively spared with a single word error noted during sentance repetition which pt corrected on second attempt.  Pt followed 1 step commands with 100% accuracy, 2 step command with 50% accuracy and completed 2 of 3 target items in 3 step motor command.  Pt demonstrated self correction and appears aware of errors and demonstrated use of compensatory strategies to improve communicaiton.    Pt demonstrated a mild dysarthria seemingly consistent with UUMN dysarthria, but may have some components of apraxia.  Errors increased with longer utterances.  Pt, however, denies apraxic symptoms to questioning.  Pt's speech is largely intelligible at phrase/short sentence level.    Cognition was not formally assessed today given interference of communication in cognitive assessment.  Pt was oriented to person, place, and situation, but was not fully oriented to time. Pt had correct day of week and time, but not year or month.  Pt appeared to favor L side but was able to track to R with cuing when SLP was on pt's R side presenting stimuli.   Pt would benefit from speech therapy to target aphasia in house, and will likely require speech therapy at next level of care.  Recommend full cognitive assessment as language ability improves.    SLP Assessment  SLP Recommendation/Assessment: Patient needs continued Speech Lanaguage Pathology Services SLP Visit Diagnosis: Aphasia (R47.01);Dysarthria and anarthria (R47.1)    Recommendations for follow up therapy are one component of a multi-disciplinary discharge planning process, led by the attending physician.  Recommendations may be updated based on patient status, additional functional criteria and insurance authorization.  Follow Up Recommendations  Acute inpatient rehab (3hours/day)    Assistance Recommended at Discharge  Frequent or constant  Supervision/Assistance  Functional Status Assessment Patient has had a recent decline in their functional status and demonstrates the ability to make significant improvements in function in a reasonable and predictable amount of time.  Frequency and Duration min 2x/week  2 weeks      SLP Evaluation Cognition  Orientation Level: Oriented to person;Oriented to place;Oriented to situation;Disoriented to time Month: May Day of Week: Correct       Comprehension  Auditory Comprehension Overall Auditory Comprehension: Impaired Commands: Impaired Two Step Basic Commands: 50-74% accurate Visual Recognition/Discrimination Discrimination: Not tested Reading Comprehension Reading Status: Not tested    Expression Expression Primary Mode of Expression: Verbal Verbal Expression Overall Verbal Expression: Impaired Level of Generative/Spontaneous Verbalization: Word;Phrase Repetition: Impaired Level of Impairment: Sentence level Naming: Impairment Confrontation: Impaired Verbal Errors: Semantic paraphasias;Phonemic paraphasias Effective Techniques: Phonemic cues Written Expression Written Expression: Not tested   Oral / Motor  Oral Motor/Sensory Function Overall Oral Motor/Sensory Function: Mild impairment Facial ROM: Reduced right Facial Symmetry: Abnormal symmetry right Facial Strength: Reduced right Lingual ROM: Within Functional Limits Lingual Symmetry: Within Functional Limits Velum: Within Functional Limits Mandible: Within Functional Limits Motor Speech Overall Motor Speech: Impaired Respiration: Within functional limits Resonance: Within functional limits Articulation: Impaired Level of Impairment: Sentence Intelligibility: Intelligibility reduced Sentence: 50-74% accurate Motor Planning: Witnin functional limits            Kerrie Pleasure, MA, CCC-SLP Acute Rehabilitation Services Office: [phone] 11/04/2021, 12:07 PM

## 2021-11-04 NOTE — Progress Notes (Signed)
SLP Cancellation Note  Patient Details Name: Denise Herrera MRN: [id number] DOB: 05/22/1970   Cancelled treatment:        Attempted to see pt for speech-language evaluation. Pt off floor for MRI at time of attempt.  SLP will reattempt as schedule permits.   Kerrie Pleasure, MA, CCC-SLP Acute Rehabilitation Services Office: [phone] 11/04/2021, 9:39 AM

## 2021-11-04 NOTE — Progress Notes (Signed)
STROKE TEAM PROGRESS NOTE   INTERVAL HISTORY Her son is at the bedside.  She was initially out of the room for testing on rounds. When I returned she is sitting up in bed comfortably.  Her husband and son are at the bedside.  Patient's aphasia appears to have improved she is able to not communicate.  She is also moving the right side much better against gravity.  Blood pressure is adequately controlled.  MRI scan of the brain shows stable appearance of the large left thalamic parenchymal hemorrhage with adjacent cytotoxic edema with trace intraventricular extension and mild mass effect on the upper midbrain.  Vitals:   11/04/21 1400 11/04/21 1500 11/04/21 1600 11/04/21 1700  BP: 129/71 131/88 (!) 148/89 135/86  Pulse: 78 78 79 (!) 56  Resp: 19 20 (!) 21 17  Temp:      TempSrc:      SpO2: 97% 98% 97% 98%  Weight:      Height:       CBC:  Recent Labs  Lab 11/03/21 1545 11/03/21 1550  WBC 6.1  --   NEUTROABS 2.6  --   HGB 13.8 14.6  HCT 42.6 43.0  MCV 92.6  --   PLT 353  --    Basic Metabolic Panel:  Recent Labs  Lab 11/03/21 1545 11/03/21 1550  NA 138 138  K 3.6 3.5  CL 105 104  CO2 25  --   GLUCOSE 96 92  BUN 18 20  CREATININE 1.15* 1.20*  CALCIUM 9.5  --    Lipid Panel:  Recent Labs  Lab 11/03/21 1851  CHOL 225*  TRIG 59  HDL 71  CHOLHDL 3.2  VLDL 12  LDLCALC 353*   HgbA1c:  Recent Labs  Lab 11/03/21 1851  HGBA1C 5.3   Urine Drug Screen: No results for input(s): "LABOPIA", "COCAINSCRNUR", "LABBENZ", "AMPHETMU", "THCU", "LABBARB" in the last 168 hours.  Alcohol Level  Recent Labs  Lab 11/03/21 1545  ETH <10    IMAGING past 24 hours ECHOCARDIOGRAM COMPLETE  Result Date: 11/04/2021    ECHOCARDIOGRAM REPORT   Patient Name:   Denise Herrera Date of Exam: 11/04/2021 Medical Rec #:  [id number]      Height:       64.0 in Accession #:    [id number]     Weight:       168.9 lb Date of Birth:  [birth year]      BSA:          1.821 m Patient Age:    51 years        BP:           147/111 mmHg Patient Gender: F              HR:           77 bpm. Exam Location:  Inpatient Procedure: 2D Echo, Cardiac Doppler and Color Doppler Indications:    Stroke I63.9  History:        Patient has no prior history of Echocardiogram examinations.                 Risk Factors:Hypertension. ICH.  Sonographer:    Sheralyn Boatman RDCS Referring Phys: [id number] CORTNEY E DE LA TORRE  Sonographer Comments: Technically difficult study due to poor echo windows. Poor sound wave transmission. Patient in restraints. IMPRESSIONS  1. Left ventricular ejection fraction, by estimation, is 65 to 70%. The left ventricle has normal function. The left ventricle has no regional  wall motion abnormalities. There is moderate concentric left ventricular hypertrophy. Left ventricular diastolic parameters are consistent with Grade I diastolic dysfunction (impaired relaxation).  2. Right ventricular systolic function is normal. The right ventricular size is normal. Tricuspid regurgitation signal is inadequate for assessing PA pressure.  3. The pericardial effusion is posterior to the left ventricle.  4. The mitral valve is grossly normal. No evidence of mitral valve regurgitation. No evidence of mitral stenosis.  5. The aortic valve is tricuspid. Aortic valve regurgitation is not visualized. No aortic stenosis is present.  6. There is mild dilatation of the ascending aorta,when indexed to age, gender, and BSA, measuring 37 mm. Comparison(s): No prior Echocardiogram. FINDINGS  Left Ventricle: Left ventricular ejection fraction, by estimation, is 65 to 70%. The left ventricle has normal function. The left ventricle has no regional wall motion abnormalities. The left ventricular internal cavity size was small. There is moderate  concentric left ventricular hypertrophy. Left ventricular diastolic parameters are consistent with Grade I diastolic dysfunction (impaired relaxation). Right Ventricle: The right ventricular size is normal.  No increase in right ventricular wall thickness. Right ventricular systolic function is normal. Tricuspid regurgitation signal is inadequate for assessing PA pressure. Left Atrium: Left atrial size was normal in size. Right Atrium: Right atrial size was normal in size. Pericardium: Trivial pericardial effusion is present. The pericardial effusion is posterior to the left ventricle. Mitral Valve: The mitral valve is grossly normal. No evidence of mitral valve regurgitation. No evidence of mitral valve stenosis. Tricuspid Valve: The tricuspid valve is normal in structure. Tricuspid valve regurgitation is not demonstrated. No evidence of tricuspid stenosis. Aortic Valve: The aortic valve is tricuspid. Aortic valve regurgitation is not visualized. No aortic stenosis is present. Pulmonic Valve: The pulmonic valve was grossly normal. Pulmonic valve regurgitation is not visualized. Aorta: The aortic root is normal in size and structure. There is mild dilatation of the ascending aorta, measuring 37 mm. Venous: The inferior vena cava was not well visualized. IAS/Shunts: No atrial level shunt detected by color flow Doppler.  LEFT VENTRICLE PLAX 2D LVIDd:         3.40 cm     Diastology LVIDs:         2.40 cm     LV e' medial:    3.70 cm/s LV PW:         1.30 cm     LV E/e' medial:  14.7 LV IVS:        1.35 cm     LV e' lateral:   7.29 cm/s LVOT diam:     2.20 cm     LV E/e' lateral: 7.5 LV SV:         98 LV SV Index:   54 LVOT Area:     3.80 cm  LV Volumes (MOD) LV vol d, MOD A2C: 77.4 ml LV vol d, MOD A4C: 62.0 ml LV vol s, MOD A2C: 18.1 ml LV vol s, MOD A4C: 21.5 ml LV SV MOD A2C:     59.3 ml LV SV MOD A4C:     62.0 ml LV SV MOD BP:      49.6 ml RIGHT VENTRICLE             IVC RV S prime:     12.40 cm/s  IVC diam: 0.80 cm TAPSE (M-mode): 1.9 cm LEFT ATRIUM             Index        RIGHT ATRIUM  Index LA diam:        2.20 cm 1.21 cm/m   RA Area:     7.94 cm LA Vol (A2C):   31.7 ml 17.41 ml/m  RA Volume:   11.70 ml  6.43 ml/m LA Vol (A4C):   16.0 ml 8.79 ml/m LA Biplane Vol: 23.9 ml 13.13 ml/m  AORTIC VALVE LVOT Vmax:   130.00 cm/s LVOT Vmean:  83.100 cm/s LVOT VTI:    0.257 m  AORTA Ao Root diam: 3.35 cm Ao Asc diam:  3.65 cm MITRAL VALVE MV Area (PHT): 2.64 cm    SHUNTS MV Decel Time: 287 msec    Systemic VTI:  0.26 m MV E velocity: 54.35 cm/s  Systemic Diam: 2.20 cm MV A velocity: 86.90 cm/s MV E/A ratio:  0.63 Riley Lam MD Electronically signed by Riley Lam MD Signature Date/Time: 11/04/2021/12:42:35 PM    Final    MR BRAIN WO CONTRAST  Result Date: 11/04/2021 CLINICAL DATA:  Stroke, follow up EXAM: MRI HEAD WITHOUT CONTRAST TECHNIQUE: Multiplanar, multiecho pulse sequences of the brain and surrounding structures were obtained without intravenous contrast. COMPARISON:  None Available. FINDINGS: Brain: Acute parenchymal hemorrhage is present within the left thalamus extending into the adjacent posterior internal capsule and superiorly into the corona radiata. There is mild surrounding edema. Mild mass effect is present. Intraventricular extension of hemorrhage is again noted. Limited evaluation for underlying lesion on this noncontrast study. Patchy foci of T2 hyperintensity in the supratentorial white matter nonspecific but may reflect mild chronic microvascular ischemic changes. There is no evidence prior intracranial hemorrhage. Vascular: Major vessel flow voids at the skull base are preserved. Skull and upper cervical spine: Normal marrow signal is preserved. Sinuses/Orbits: Paranasal sinuses are aerated. Orbits are unremarkable. Other: Sella is unremarkable.  Mastoid air cells are clear. IMPRESSION: Acute parenchymal hemorrhage with involvement of the left thalamus and adjacent white matter with intraventricular extension. Mild edema and mass effect. Limited evaluation for underlying lesion this noncontrast study. No evidence of prior hemorrhage. Electronically Signed   By: Guadlupe Spanish  M.D.   On: 11/04/2021 10:39   CT HEAD WO CONTRAST ( )  Result Date: 11/04/2021 CLINICAL DATA:  Follow-up intraparenchymal hemorrhage on the left EXAM: CT HEAD WITHOUT CONTRAST TECHNIQUE: Contiguous axial images were obtained from the base of the skull through the vertex without intravenous contrast. RADIATION DOSE REDUCTION: This exam was performed according to the departmental dose-optimization program which includes automated exposure control, adjustment of the mA and/or kV according to patient size and/or use of iterative reconstruction technique. COMPARISON:  CT from the previous day. FINDINGS: Brain: There is again noted intraparenchymal hemorrhage involving left corona radiata and left thalamic capsular region with intraventricular extension identified. On the coronal imaging the maximum dimension is 3.8 cm increased from 3.2 cm on the prior exam. Some mass effect on the adjacent left lateral ventricle is noted. Intraventricular hemorrhage is seen. No significant increase in ventricular dilatation is noted when compared with the prior study. No new area focal hemorrhage is seen. Vascular: No hyperdense vessel or unexpected calcification. Skull: Normal. Negative for fracture or focal lesion. Sinuses/Orbits: No acute finding. Other: None. IMPRESSION: Persistent left parenchymal hemorrhage as described which has increased in greatest dimension on coronal imaging from 3.2-3.8 cm. The some slight increase in mass effect upon the left lateral ventricle is noted although no significant midline shift is seen. No new hydrocephalus is seen. Electronically Signed   By: Alcide Clever M.D.   On: 11/04/2021 01:43  PHYSICAL EXAM  Pleasant middle-aged African-American lady not in distress. Physical Exam  Constitutional: Appears well-developed and well-nourished middle-age African-American lady.  Psych: Affect appropriate to situation Eyes: No scleral injection HENT: No OP obstrucion Head:  Normocephalic. Respiratory: Effort normal and breath sounds normal to anterior ascultation Skin: WDI   Neuro: Mental Status: Patient is awake, alert and responds to name Patient is not able to give a clear and coherent history due to aphasia Moderate expressive and mild receptive aphasia and dysarthria present Cranial Nerves: II: PERRL III,IV, VI: Left gaze preference but able to overcome V: Facial sensation is symmetric to light touch VII: Right sided facial droop VIII: hearing is intact to voice XII: tongue is midline without atrophy or fasciculations.  Motor: Tone is normal. Bulk is normal. 5/5 strength to LUE and LLE, 3/5 to RUE with significant weakness of the right grip and intrinsic hand muscles.  And 3/5 to RLE Sensory: Sensation is symmetric to light touch and in the arms and legs. Plantars: Toes are downgoing bilaterally.      ASSESSMENT/PLAN Denise Herrera is a 51 y.o. female with history of hypertension who was at work today and called her husband at 21 complaining of right sided numbness.  Her co-workers say this was the last time they saw her in a normal state.  She then went to the bathroom at work, developed right sided weakness and aphasia and eventually was able to call for help.  On arrival to the ED, she has expressive aphasia but responds to her name and is able to follow commands.  RUE is flaccid and RLE has 2/5 strength.  She has no history of anticoagulant use.  Blood pressure was only mildly elevated at 141/85.   ICH (intracerebral hemorrhage)  6/20- MRI brain- Acute parenchymal hemorrhage with involvement of the left thalamus and adjacent white matter with intraventricular extension. Mild edema and mass effect. Limited evaluation for underlying lesion this non contrast study. No evidence of prior hemorrhage. 6/19-CT head- Acute parenchymal hemorrhage in the left corona radiata and thalamocapsular region with intraventricular extension. No significant mass  effect or hydrocephalus. 6/19- CTA head & neck - No aneurysm, AVM, or other significant vascular abnormality. 6/20- Repeat CTH-Persistent left parenchymal hemorrhage as described which has increased in greatest dimension on coronal imaging from 3.2-3.8 cm.The some slight increase in mass effect upon the left lateral ventricle is noted although no significant midline shift is seen. No new hydrocephalus is seen. 2D Echo- EF 65-70% and no shunt LDL 142 HgbA1c 5.3 VTE prophylaxis - none due to [place]    Diet   Diet heart healthy/carb modified Room service appropriate? Yes; Fluid consistency: Thin   Therapy recommendations:  pending Disposition:  pending therapy recs  Hypertension Home meds:  Micardis 40mg  Stable Permissive hypertension (OK if < 220/120) but gradually normalize in 5-7 days Long-term BP goal normotensive  Hyperlipidemia Home meds:  Lipitor 20mg  PO and Fish oil 1 capsule daily. Lipitor ordered inpatient and will resume fish oil as well on discharge.  LDL 142, goal < 70 Resume fish oil at discharge  Continue statin at discharge  Hospital day # Rentz, AGNP-BC [facility]   I have personally obtained history,examined this patient, reviewed notes, independently viewed imaging studies, participated in medical decision making and plan of care.ROS completed by me personally and pertinent positives fully documented  I have made any additions or clarifications directly to the above note. Agree with note above.  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  She has shown some neurological improvement and follow-up imaging has shown stable appearance of the hematoma and blood pressure has been adequately controlled.  Continue close neurological monitoring and strict blood pressure control with systolic goal below 0000000.  Use as needed IV labetalol and hydralazine and taper  Cleviprex drip as tolerated.  Speech therapy for swallow eval.  Mobilize out of bed.  Physical Occupational Therapy consults.  Long discussion with patient and husband at the bedside and answered questions. This patient is critically ill and at significant risk of neurological worsening, death and care requires constant monitoring of vital signs, hemodynamics,respiratory and cardiac monitoring, extensive review of multiple databases, frequent neurological assessment, discussion with family, other specialists and medical decision making of high complexity.I have made any additions or clarifications directly to the above note.This critical care time does not reflect procedure time, or teaching time or supervisory time of PA/NP/Med Resident etc but could involve care discussion time.  I spent 40 minutes of neurocritical care time  in the care of  this patient.       Antony Contras, MD Medical Director [facility] St[facility] Pager: [phone] 11/04/2021 5:11 PM  To contact Stroke Continuity provider, please refer to [email or web address] After hours, contact General Neurology

## 2021-11-04 NOTE — Evaluation (Signed)
Occupational Therapy Evaluation Patient Details Name: Denise Herrera MRN: [id number] DOB: 1971/01/01 Today's Date: 11/04/2021   History of Present Illness Denise Herrera is a 51 y.o. female who presented with R weakness and aphasia. 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 MRI 6/20: "Acute parenchymal hemorrhage with involvement of the left thalamus  and adjacent white matter with intraventricular extension. Mild  edema and mass effect."   Pt with a history of hypertension   Clinical Impression   Denise Herrera was evaluated s/p the above admission list, she is indep at baseline including working and driving. PLOF and home set up contained from pt's spouse as pt presents with expressive communication difficulties. Upon evaluation pt demonstrated functional deficits due to impaired communication, R hemiparesis, impaired balance, decreased activity tolerance, impaired cognition and lethargy. Overall she required min-mod A +2 for all mobility and needs up to max A +2 for ADLs. She will benefit from continued OT acutely. Recommend d/c to AIR for maximal functional recovery.    Recommendations for follow up therapy are one component of a multi-disciplinary discharge planning process, led by the attending physician.  Recommendations may be updated based on patient status, additional functional criteria and insurance authorization.   Follow Up Recommendations  Acute inpatient rehab (3hours/day)    Assistance Recommended at Discharge Frequent or constant Supervision/Assistance  Patient can return home with the following A lot of help with walking and/or transfers;A lot of help with bathing/dressing/bathroom;Assistance with cooking/housework;Direct supervision/assist for medications management;Assist for transportation;Help with stairs or ramp for entrance    Functional Status Assessment  Patient has had a recent decline in their functional status and demonstrates the ability to make significant improvements in function in a  reasonable and predictable amount of time.  Equipment Recommendations  Other (comment) (pending progress)    Recommendations for Other Services Rehab consult     Precautions / Restrictions Precautions Precautions: Fall Precaution Comments: R hemiparesis, R inattention      Mobility Bed Mobility Overal bed mobility: Needs Assistance Bed Mobility: Supine to Sit     Supine to sit: Mod assist     General bed mobility comments: very impulsive, mod A for trunk elevation    Transfers Overall transfer level: Needs assistance Equipment used: Rolling walker (2 wheels) Transfers: Sit to/from Stand, Bed to chair/wheelchair/BSC Sit to Stand: Min assist, +2 physical assistance     Step pivot transfers: Mod assist, +2 physical assistance, +2 safety/equipment     General transfer comment: very impulsive, min A +2 to stand, heavy mod A to pivot to chiar      Balance Overall balance assessment: Needs assistance Sitting-balance support: Feet supported Sitting balance-Leahy Scale: Poor Sitting balance - Comments: min A   Standing balance support: During functional activity, Bilateral upper extremity supported Standing balance-Leahy Scale: Poor                             ADL either performed or assessed with clinical judgement   ADL Overall ADL's : Needs assistance/impaired Eating/Feeding: Moderate assistance;Sitting   Grooming: Moderate assistance;Sitting   Upper Body Bathing: Maximal assistance;Sitting   Lower Body Bathing: Maximal assistance;+2 for safety/equipment;+2 for physical assistance;Sit to/from stand   Upper Body Dressing : Maximal assistance;Sitting   Lower Body Dressing: Maximal assistance;+2 for physical assistance;+2 for safety/equipment;Sit to/from stand   Toilet Transfer: +2 for physical assistance;+2 for safety/equipment;Rolling walker (2 wheels);Stand-pivot;Moderate assistance Toilet Transfer Details (indicate cue type and reason): simulated  from bed>chair this  date Toileting- Architect and Hygiene: Maximal assistance;+2 for physical assistance;+2 for safety/equipment;Sit to/from stand       Functional mobility during ADLs: +2 for physical assistance;+2 for safety/equipment;Moderate assistance General ADL Comments: R hemi-paresis, impulsive     Vision Baseline Vision/History: 0 No visual deficits Vision Assessment?: Vision impaired- to be further tested in functional context Additional Comments: R inattention noted; L gaze preference. able to track pen in all quads. needs further assessment     Perception     Praxis      Pertinent Vitals/Pain Pain Assessment Pain Assessment: Faces Faces Pain Scale: No hurt Pain Intervention(s): Monitored during session     Hand Dominance Right   Extremity/Trunk Assessment Upper Extremity Assessment Upper Extremity Assessment: LUE deficits/detail;RUE deficits/detail RUE Deficits / Details: trace movements noted at the hand, wrist and elbow. Sensory motor deficits. Difficult to fully assess RUE Sensation: decreased proprioception;decreased light touch RUE Coordination: decreased fine motor;decreased gross motor LUE Deficits / Details: seemingly WFL for ROM and MMT   Lower Extremity Assessment Lower Extremity Assessment: Defer to PT evaluation RLE Deficits / Details: able to lift antigravity, but functionally knee buckling with weight bearing forward or backward, needs assist to progress the leg RLE Sensation: decreased proprioception;decreased light touch RLE Coordination: decreased gross motor   Cervical / Trunk Assessment Cervical / Trunk Assessment: Normal   Communication Communication Communication: Expressive difficulties   Cognition Arousal/Alertness: Lethargic Behavior During Therapy: Flat affect, Impulsive Overall Cognitive Status: Impaired/Different from baseline Area of Impairment: Safety/judgement, Problem solving, Following commands, Attention                    Current Attention Level: Sustained   Following Commands: Follows one step commands with increased time Safety/Judgement: Decreased awareness of safety, Decreased awareness of deficits   Problem Solving: Slow processing, Requires verbal cues General Comments: R inattention, follows 50% of commands with increased time and cues. Some clear verbalizations made, inaccurate information noted after speaking with family.     General Comments  VSS on RA    Exercises     Shoulder Instructions      Home Living Family/patient expects to be discharged to:: Private residence Living Arrangements: Spouse/significant other;Children Available Help at Discharge: Family Type of Home: House Home Access: Stairs to enter Secretary/administrator of Steps: 3 Entrance Stairs-Rails: None Home Layout: Two level;Bed/bath upstairs;1/2 bath on main level Alternate Level Stairs-Number of Steps: flight Alternate Level Stairs-Rails: Right Bathroom Shower/Tub: Producer, television/film/video: Standard     Home Equipment: Agricultural consultant (2 wheels);Shower seat   Additional Comments: Spouse works 4-12 pm, son lives with them also and can assist      Prior Functioning/Environment Prior Level of Function : Independent/Modified Independent;Working/employed;Driving             Mobility Comments: worked full time as Production designer, theatre/television/film at [employer] List: Decreased strength;Decreased range of motion;Decreased activity tolerance;Impaired balance (sitting and/or standing);Decreased coordination;Decreased cognition;Decreased safety awareness;Decreased knowledge of use of DME or AE;Decreased knowledge of precautions;Pain;Impaired UE functional use      OT Treatment/Interventions: Therapeutic exercise;Self-care/ADL training;DME and/or AE instruction;Therapeutic activities;Patient/family education;Balance training    OT Goals(Current goals can be found in the care plan  section) Acute Rehab OT Goals Patient Stated Goal: unable to state OT Goal Formulation: With patient Time For Goal Achievement: 11/18/21 Potential to Achieve Goals: Good ADL Goals Pt Will Perform Grooming: with supervision;sitting Pt Will Perform Upper  Body Dressing: sitting;with min guard assist Pt Will Perform Lower Body Dressing: with min assist;sit to/from stand Pt Will Transfer to Toilet: with min assist;ambulating Additional ADL Goal #1: Pt will locate at least 3 grooming items in her R environment with min cues to participate in grooming task  OT Frequency: Min 2X/week    Co-evaluation PT/OT/SLP Co-Evaluation/Treatment: Yes Reason for Co-Treatment: Complexity of the patient's impairments (multi-system involvement);For patient/therapist safety;To address functional/ADL transfers PT goals addressed during session: Mobility/safety with mobility;Balance OT goals addressed during session: ADL's and self-care      AM-PAC OT "6 Clicks" Daily Activity     Outcome Measure Help from another person eating meals?: A Lot Help from another person taking care of personal grooming?: A Lot Help from another person toileting, which includes using toliet, bedpan, or urinal?: A Lot Help from another person bathing (including washing, rinsing, drying)?: A Lot Help from another person to put on and taking off regular upper body clothing?: A Lot Help from another person to put on and taking off regular lower body clothing?: A Lot 6 Click Score: 12   End of Session Equipment Utilized During Treatment: Gait belt;Rolling walker (2 wheels) Nurse Communication: Mobility status  Activity Tolerance: Patient tolerated treatment well Patient left: in chair;with call bell/phone within reach;with chair alarm set  OT Visit Diagnosis: Unsteadiness on feet (R26.81);Other abnormalities of gait and mobility (R26.89);Repeated falls (R29.6);Muscle weakness (generalized) (M62.81);Pain;Hemiplegia and  hemiparesis Hemiplegia - Right/Left: Right Hemiplegia - dominant/non-dominant: Dominant Hemiplegia - caused by: Nontraumatic intracerebral hemorrhage                Time: 1426-1450 OT Time Calculation (min): 24 min Charges:  OT General Charges $OT Visit: 1 Visit OT Evaluation $OT Eval Moderate Complexity: 1 Mod   Jaycion Treml A Lysandra Loughmiller 11/04/2021, 4:16 PM

## 2021-11-04 NOTE — Progress Notes (Signed)
  Echocardiogram 2D Echocardiogram has been performed.  Denise Herrera 11/04/2021, 11:08 AM

## 2021-11-04 NOTE — Progress Notes (Signed)
PT Cancellation Note  Patient Details Name: ANGELIKI MATES MRN: [id number] DOB: 11-Dec-1970   Cancelled Treatment:    Reason Eval/Treat Not Completed: Active bedrest order; will follow up when activity orders upgraded.    Elray Mcgregor 11/04/2021, 8:32 AM Sheran Lawless, PT Acute Rehabilitation Services Office:[phone] 11/04/2021

## 2021-11-04 NOTE — Progress Notes (Signed)
  Transition of Care [facility]) Screening Note   Patient Details  Name: Denise Herrera Date of Birth: 02/18/1971   Transition of Care [facility]) CM/SW Contact:    Glennon Mac, RN Phone Number: 11/04/2021, 4:53 PM    Transition of Care Department [facility]) has reviewed patient and no TOC needs have been identified at this time. We will continue to monitor patient advancement through interdisciplinary progression rounds. If new patient transition needs arise, please place a TOC consult.  Quintella Baton, RN, BSN  Trauma/Neuro ICU Case Manager [phone]

## 2021-11-05 DIAGNOSIS — I610 Nontraumatic intracerebral hemorrhage in hemisphere, subcortical: Secondary | ICD-10-CM | POA: Diagnosis not present

## 2021-11-05 MED ORDER — METHYLPHENIDATE HCL 20 MG PO TABS
20.0000 mg | ORAL_TABLET | Freq: Two times a day (BID) | ORAL | Status: DC | PRN
  Filled 2021-11-05 (×4): qty 1

## 2021-11-05 MED ORDER — CALCIUM CARBONATE ANTACID 500 MG PO CHEW
2.0000 | CHEWABLE_TABLET | Freq: Once | ORAL | Status: AC
Start: 2021-11-05 — End: 2021-11-05
  Administered 2021-11-05: 400 mg via ORAL
  Filled 2021-11-05: qty 2

## 2021-11-05 MED ORDER — METHYLPHENIDATE HCL ER 20 MG PO TBCR: 20.0000 mg | EXTENDED_RELEASE_TABLET | Freq: Every day | ORAL | Status: DC

## 2021-11-05 MED ORDER — ALUM & MAG HYDROXIDE-SIMETH 200-200-20 MG/5ML PO SUSP
30.0000 mL | Freq: Four times a day (QID) | ORAL | Status: DC | PRN
Start: 2021-11-05 — End: 2021-11-08
  Administered 2021-11-05: 30 mL via ORAL
  Filled 2021-11-05: qty 30

## 2021-11-05 NOTE — Progress Notes (Addendum)
STROKE TEAM PROGRESS NOTE   INTERVAL HISTORY Patient is seen in her room with one family member at the bedside. Her vitals have been stable overnight, and her neurological exam is unchanged.  She is able to communicate well.  Right hemiparesis is also improving.  Blood pressure adequately controlled. MRI brain yesterday showed stable appearance of the left thalamic hematoma with trace intraventricular extension but no hydrocephalus. Vitals:   11/05/21 1223 11/05/21 1230 11/05/21 1300 11/05/21 1330  BP: (!) 144/77 (!) 155/80 133/83 133/77  Pulse: 65 62 63 63  Resp: (!) 21 (!) 21 (!) 21 20  Temp:      TempSrc:      SpO2: 96% 96% 96% 96%  Weight:      Height:       CBC:  Recent Labs  Lab 11/03/21 1545 11/03/21 1550  WBC 6.1  --   NEUTROABS 2.6  --   HGB 13.8 14.6  HCT 42.6 43.0  MCV 92.6  --   PLT 353  --     Basic Metabolic Panel:  Recent Labs  Lab 11/03/21 1545 11/03/21 1550  NA 138 138  K 3.6 3.5  CL 105 104  CO2 25  --   GLUCOSE 96 92  BUN 18 20  CREATININE 1.15* 1.20*  CALCIUM 9.5  --     Lipid Panel:  Recent Labs  Lab 11/03/21 1851  CHOL 225*  TRIG 59  HDL 71  CHOLHDL 3.2  VLDL 12  LDLCALC 093*    HgbA1c:  Recent Labs  Lab 11/03/21 1851  HGBA1C 5.3    Urine Drug Screen: No results for input(s): "LABOPIA", "COCAINSCRNUR", "LABBENZ", "AMPHETMU", "THCU", "LABBARB" in the last 168 hours.  Alcohol Level  Recent Labs  Lab 11/03/21 1545  ETH <10     IMAGING past 24 hours No results found.  PHYSICAL EXAM  Pleasant middle-aged African-American lady not in distress. Physical Exam  Constitutional: Appears well-developed and well-nourished middle-age African-American lady.  Psych: Affect appropriate to situation Eyes: No scleral injection HENT: No OP obstrucion Head: Normocephalic. Skin: WDI   Neuro: Mental Status: Patient is awake, alert and responds to name Patient is able to give a clear and coherent history Mild expressive and mild  receptive aphasia and dysarthria present Cranial Nerves: II: PERRL III,IV, VI: Left gaze preference but able to overcome V: Facial sensation is symmetric to light touch VII: Right sided facial droop VIII: hearing is intact to voice XII: tongue is midline without atrophy or fasciculations.  Motor: Tone is normal. Bulk is normal. 5/5 strength to LUE and LLE, 3/5 to RUE with significant weakness of the right grip and intrinsic hand muscles.  And 3/5 to RLE Sensory: Sensation is symmetric to light touch and in the arms and legs. Plantars: Toes are downgoing bilaterally.      ASSESSMENT/PLAN Denise Herrera is a 51 y.o. female with history of hypertension who was at work today and called her husband at 1330 complaining of right sided numbness.  Her co-workers say this was the last time they saw her in a normal state.  She then went to the bathroom at work, developed right sided weakness and aphasia and eventually was able to call for help.  On arrival to the ED, she has expressive aphasia but responds to her name and is able to follow commands.  RUE is flaccid and RLE has 2/5 strength.  She has no history of anticoagulant use.  Blood pressure was only mildly elevated  at 141/85.   ICH (intracerebral hemorrhage)  6/20- MRI brain- Acute parenchymal hemorrhage with involvement of the left thalamus and adjacent white matter with intraventricular extension. Mild edema and mass effect. Limited evaluation for underlying lesion this non contrast study. No evidence of prior hemorrhage. 6/19-CT head- Acute parenchymal hemorrhage in the left corona radiata and thalamocapsular region with intraventricular extension. No significant mass effect or hydrocephalus. 6/19- CTA head & neck - No aneurysm, AVM, or other significant vascular abnormality. 6/20- Repeat CTH-Persistent left parenchymal hemorrhage as described which has increased in greatest dimension on coronal imaging from 3.2-3.8 cm.The some slight  increase in mass effect upon the left lateral ventricle is noted although no significant midline shift is seen. No new hydrocephalus is seen. 2D Echo- EF 65-70% and no shunt LDL 142 HgbA1c 5.3 VTE prophylaxis - none due to ICH    Diet   Diet heart healthy/carb modified Room service appropriate? Yes with Assist; Fluid consistency: Thin   Therapy recommendations: CIR Disposition:  pending   Hypertension Home meds:  Micardis 40mg  Stable Permissive hypertension (OK if < 220/120) but gradually normalize in 5-7 days Long-term BP goal normotensive  Hyperlipidemia Home meds:  Lipitor 20mg  PO and Fish oil 1 capsule daily. Lipitor ordered inpatient and will resume fish oil as well on discharge.  LDL 142, goal < 70 Resume fish oil at discharge  Continue statin at discharge  Hospital day # 2  Cortney E , MSN, AGACNP-BC [facility] See Amion for schedule and pager information 11/05/2021 3:24 PM   I have personally obtained history,examined this patient, reviewed notes, independently viewed imaging studies, participated in medical decision making and plan of care.ROS completed by me personally and pertinent positives fully documented  I have made any additions or clarifications directly to the above note. Agree with note above.  Patient is showing gradual improvement.  Blood pressure adequately controlled.  Resume home medications and use as needed IV labetalol and hydralazine and taper and discontinue Cleviprex drip.  Mobilize out of bed.  Physical occupational and speech therapy consults.  Transfer to neurology floor bed later today.  Long discussion with patient and husband at the bedside and answered questions.This patient is critically ill and at significant risk of neurological worsening, death and care requires constant monitoring of vital signs, hemodynamics,respiratory and cardiac monitoring, extensive review of multiple databases, frequent neurological assessment,  discussion with family, other specialists and medical decision making of high complexity.I have made any additions or clarifications directly to the above note.This critical care time does not reflect procedure time, or teaching time or supervisory time of PA/NP/Med Resident etc but could involve care discussion time.  I spent 30 minutes of neurocritical care time  in the care of  this patient.      Ernestina Columbia, MD Medical Director [facility] Pager: [phone] 11/05/2021 4:18 PM  To contact Stroke Continuity provider, please refer to [phone]. After hours, contact General Neurology

## 2021-11-05 NOTE — PMR Pre-admission (Shared)
PMR Admission Coordinator Pre-Admission Assessment  Patient: Denise Herrera is an 51 y.o., female MRN: [id number] DOB: August 29, 1970 Height: _0  (162.6 cm) Weight: 76.6 kg  Insurance Information HMO:     PPO:      PCP:      IPA:      80/20:      OTHER: EPO PRIMARY: BCBS       Policy#: [id number]      Subscriber: patient CM Name: ***      Phone#: ***     Fax#: [phone] Pre-Cert#: [id number]      Employer: [employer] Benefits:  Phone #: [phone]     Name: on line benefits checked Eff. Date: 05/18/21     Deduct: $750 (met $0)      Out of Pocket Max: $2500 (met $580)      Life Max: N/A CIR: 80% coverage      SNF: 80% coverage with 60 days/yr limit Outpatient: med nec     Co-Pay: $40 Home Health: 80%      Co-Pay: 20% DME: 80%     Co-Pay: 20% Providers: in network  SECONDARY:       Policy#:      Phone#:   Development worker, community:       Phone#:   The Engineer, petroleum" for patients in Inpatient Rehabilitation Facilities with attached "Privacy Act [place] Records" was provided and verbally reviewed with: N/A  Emergency Contact Information Contact Information     Name Relation Home Work Mobile   [place] Other [phone]         Current Medical History  Patient Admitting Diagnosis: Acute Parenchymal Hemorrhage   History of Present Illness: ***  Complete NIHSS TOTAL: 10  Patient's medical record from [facility] has been reviewed by the rehabilitation admission coordinator and physician.  Past Medical History  Past Medical History:  Diagnosis Date   Chronic insomnia    History of migraine headaches    Narcolepsy without cataplexy(347.00)     Has the patient had major surgery during 100 days prior to admission? Yes  Family History   family history includes Asthma in her mother; Cancer in her mother; Diabetes in her maternal grandfather; Hypertension in her maternal grandfather, maternal grandmother, and  mother.  Current Medications  Current Facility-Administered Medications:    acetaminophen (TYLENOL) tablet 650 mg, 650 mg, Oral, Q4H PRN, 650 mg at 11/04/21 1616 **OR** acetaminophen (TYLENOL) 160 MG/5ML solution 650 mg, 650 mg, Per Tube, Q4H PRN **OR** acetaminophen (TYLENOL) suppository 650 mg, 650 mg, Rectal, Q4H PRN, de Yolanda Manges, Cortney E, NP   atorvastatin (LIPITOR) tablet 20 mg, 20 mg, Oral, Daily, Leonie Man, Pramod S, MD, 20 mg at 11/05/21 0941   Chlorhexidine Gluconate Cloth 2 % PADS 6 each, 6 each, Topical, Q0600, Donnetta Simpers, MD, 6 each at 11/05/21 1051   [DISCONTINUED] labetalol (NORMODYNE) injection 20 mg, 20 mg, Intravenous, Once **AND** clevidipine (CLEVIPREX) infusion 0.5 mg/mL, 0-21 mg/hr, Intravenous, Continuous, de Yolanda Manges, [place] E, NP, Last Rate: 8 mL/hr at 11/05/21 0600, 4 mg/hr at 11/05/21 0600   hydrALAZINE (APRESOLINE) injection 20 mg, 20 mg, Intravenous, Q6H PRN, Garvin Fila, MD, 20 mg at 11/05/21 0092   labetalol (NORMODYNE) injection 20 mg, 20 mg, Intravenous, Q2H PRN, Garvin Fila, MD   [START ON 11/06/2021] methylphenidate (METADATE ER) ER tablet 20 mg, 20 mg, Oral, Daily, de [place], Cortney E, NP   nebivolol (BYSTOLIC) tablet 5 mg, 5 mg, Oral, Daily, Leonie Man,  Lucy [place], MD, 5 mg at 11/05/21 0941   Oral care mouth rinse, 15 mL, Mouth Rinse, PRN, Donnetta Simpers, MD   pantoprazole (PROTONIX) EC tablet 40 mg, 40 mg, Oral, Daily, Leonie Man, Pramod S, MD, 40 mg at 11/05/21 0941   senna-docusate (Senokot-S) tablet 1 tablet, 1 tablet, Oral, BID, de Yolanda Manges, [place] E, NP, 1 tablet at 11/05/21 0941   sodium chloride flush (NS) 0.9 % injection 3 mL, 3 mL, Intravenous, Once, Elnora Morrison, MD  Patients Current Diet:  Diet Order             Diet heart healthy/carb modified Room service appropriate? Yes with Assist; Fluid consistency: Thin  Diet effective now                   Precautions / Restrictions Precautions Precautions: Fall Precaution Comments:  R hemiparesis, R inattention Restrictions Weight Bearing Restrictions: No   Has the patient had 2 or more falls or a fall with injury in the past year? Yes  Prior Activity Level Community (5-7x/wk): Pt. active in the community PTA  Prior Functional Level Self Care: Did the patient need help bathing, dressing, using the toilet or eating? Independent  Indoor Mobility: Did the patient need assistance with walking from room to room (with or without device)? Independent  Stairs: Did the patient need assistance with internal or external stairs (with or without device)? Independent  Functional Cognition: Did the patient need help planning regular tasks such as shopping or remembering to take medications? Independent  Patient Information Are you of Hispanic, Latino/a,or Spanish origin?: A. No, not of Hispanic, Latino/a, or Spanish origin What is your race?: B. Black or African American Do you need or want an interpreter to communicate with a doctor or health care staff?: 0. No  Patient's Response To:  Health Literacy and Transportation Is the patient able to respond to health literacy and transportation needs?: Yes Health Literacy - How often do you need to have someone help you when you read instructions, pamphlets, or other written material from your doctor or pharmacy?: Never In the past 12 months, has lack of transportation kept you from medical appointments or from getting medications?: No In the past 12 months, has lack of transportation kept you from meetings, work, or from getting things needed for daily living?: No  Development worker, international aid / [place] Devices/Equipment: None Home Equipment: Conservation officer, nature (2 wheels), Shower seat  Prior Device Use: Indicate devices/aids used by the patient prior to current illness, exacerbation or injury? None of the above  Current Functional Level Cognition  Overall Cognitive Status: Impaired/Different from baseline Current  Attention Level: Sustained Orientation Level: Oriented to person, Oriented to place, Oriented to situation, Disoriented to time Following Commands: Follows one step commands inconsistently, Follows one step commands with increased time Safety/Judgement: Decreased awareness of safety, Decreased awareness of deficits General Comments: Pt lethargic, but able to stay awake with stimulation. Pt with R inattention, needing repeated multi-modal cues to attend to R side of body, inconsistently following commands with R side. Pt needing increased time to process cues and to initiate. Poor sequencing, needing extensive assistance for stepping. Impulsive to sit prematurely.    Extremity Assessment (includes Sensation/Coordination)  Upper Extremity Assessment: LUE deficits/detail, RUE deficits/detail RUE Deficits / Details: trace movements noted at the hand, wrist and elbow. Sensory motor deficits. Difficult to fully assess RUE Sensation: decreased proprioception, decreased light touch RUE Coordination: decreased fine motor, decreased gross motor LUE Deficits / Details:  seemingly WFL for ROM and MMT  Lower Extremity Assessment: Defer to PT evaluation RLE Deficits / Details: able to lift antigravity, but functionally knee buckling with weight bearing forward or backward, needs assist to progress the leg RLE Sensation: decreased proprioception, decreased light touch RLE Coordination: decreased gross motor    ADLs  Overall ADL's : Needs assistance/impaired Eating/Feeding: Moderate assistance, Sitting Grooming: Moderate assistance, Sitting Upper Body Bathing: Maximal assistance, Sitting Lower Body Bathing: Maximal assistance, +2 for safety/equipment, +2 for physical assistance, Sit to/from stand Upper Body Dressing : Maximal assistance, Sitting Lower Body Dressing: Maximal assistance, +2 for physical assistance, +2 for safety/equipment, Sit to/from stand Toilet Transfer: +2 for physical assistance, +2 for  safety/equipment, Rolling walker (2 wheels), Stand-pivot, Moderate assistance Toilet Transfer Details (indicate cue type and reason): simulated from bed>chair this date Toileting- Clothing Manipulation and Hygiene: Maximal assistance, +2 for physical assistance, +2 for safety/equipment, Sit to/from stand Functional mobility during ADLs: +2 for physical assistance, +2 for safety/equipment, Moderate assistance General ADL Comments: R hemi-paresis, impulsive    Mobility  Overal bed mobility: Needs Assistance Bed Mobility: Sidelying to Sit Sidelying to sit: Mod assist Supine to sit: Mod assist General bed mobility comments: Pt already on her R side upon arrival, cuing pt to bring legs off EOB but poor initiation noted. Pt needing assistance to bring legs off bed, especially her R as she tried to sit up before her leg was off the bed. ModA to then assist trunk up to sit.    Transfers  Overall transfer level: Needs assistance Equipment used: Rolling walker (2 wheels), 1 person hand held assist Transfers: Sit to/from Stand, Bed to chair/wheelchair/BSC Sit to Stand: Mod assist Bed to/from chair/wheelchair/BSC transfer type:: Step pivot Step pivot transfers: Max assist General transfer comment: Pt coming to stand 2x from EOB to PT anterior to her and 1x from recliner to RW, needing cues for R hand and foot placement and R knee block, modA to power up and steady. MaxA to steady pt, shift weight laterally, and cue/assist R leg to step to R and R knee to extend in stance to transfer bed > recliner with UE support on PT anterior to her. Pt sitting prematurely thus needing maxA to direct buttocks safely to chair.    Ambulation / Gait / Stairs / Wheelchair Mobility  Ambulation/Gait Ambulation/Gait assistance: Max Web designer (Feet): 1 Feet Assistive device: Rolling walker (2 wheels), 1 person hand held assist Gait Pattern/deviations: Step-to pattern, Decreased step length - right, Decreased  stride length, Decreased dorsiflexion - right, Decreased weight shift to left, Decreased weight shift to right, Knees buckling, Trunk flexed, Narrow base of support General Gait Details: Pt needing maxA to steady, cue weight shifting, block R knee during stance due to buckling, and cue/assist R foot to step 1x to R from bed > recliner with UE support on PT anterior to her and 1x in front of recliner with 2nd rep using RW. Gait velocity: reduced Gait velocity interpretation: <1.31 ft/sec, indicative of household ambulator    Posture / Balance Dynamic Sitting Balance Sitting balance - Comments: Pt needing min guard-minA to sit statically EOB with L UE support Balance Overall balance assessment: Needs assistance Sitting-balance support: Single extremity supported, Feet supported Sitting balance-Leahy Scale: Poor Sitting balance - Comments: Pt needing min guard-minA to sit statically EOB with L UE support Postural control: Left lateral lean Standing balance support: Bilateral upper extremity supported, Single extremity supported, During functional activity Standing balance-Leahy Scale: Poor Standing  balance comment: Reliant on UE support and external assistance    Special needs/care consideration Skin ***   Previous Home Environment (from acute therapy documentation) Living Arrangements: Spouse/significant other, Children  Lives With: Family Available Help at Discharge: Family Type of Home: House Home Layout: Two level, Bed/bath upstairs, 1/2 bath on main level Alternate Level Stairs-Rails: Right Alternate Level Stairs-Number of Steps: flight Home Access: Stairs to enter Entrance Stairs-Rails: None Entrance Stairs-Number of Steps: 3 Bathroom Shower/Tub: Multimedia programmer: Standard Home Care Services: No Additional Comments: Spouse works 4-12 pm, son lives with them also and can assist  Discharge Living Setting Plans for Discharge Living Setting: Patient's home Type of  Home at Discharge: House Discharge Home Layout: Two level, Bed/bath upstairs Alternate Level Stairs-Rails: Right Alternate Level Stairs-Number of Steps: flight Discharge Home Access: Stairs to enter Entrance Stairs-Rails: None Entrance Stairs-Number of Steps: 3 Discharge Bathroom Shower/Tub: Walk-in shower Discharge Bathroom Toilet: Standard Discharge Bathroom Accessibility: No Does the patient have any problems obtaining your medications?: No  Social/Family/Support Systems Patient Roles: Spouse Contact Information: Chrissie Noa Anticipated Caregiver: [phone] Ability/Limitations of Caregiver: Can provide Min A, son can assist while he works Building control surveyor Availability: 24/7 Discharge Plan Discussed with Primary Caregiver: Yes Is Caregiver In Agreement with Plan?: No  Goals Patient/Family Goal for Rehab: PT/OT/SLP Min A Expected length of stay: 16-18 days Program Orientation Provided & Reviewed with Pt/Caregiver Including Roles  & Responsibilities: Yes  Decrease burden of Care through IP rehab admission: Specialzed equipment needs, Decrease number of caregivers, Bowel and bladder program, and Patient/family education  Possible need for SNF placement upon discharge: not anticipated   Patient Condition: I have reviewed medical records from [facility], spoken with CM, and patient. I met with patient at the bedside for inpatient rehabilitation assessment.  Patient will benefit from ongoing PT, OT, and SLP, can actively participate in 3 hours of therapy a day 5 days of the week, and can make measurable gains during the admission.  Patient will also benefit from the coordinated team approach during an Inpatient Acute Rehabilitation admission.  The patient will receive intensive therapy as well as Rehabilitation physician, nursing, social worker, and care management interventions.  Due to safety, skin/wound care, disease management, medication administration, pain management, and  patient education the patient requires 24 hour a day rehabilitation nursing.  The patient is currently *** with mobility and basic ADLs.  Discharge setting and therapy post discharge at home with home health is anticipated.  Patient has agreed to participate in the Acute Inpatient Rehabilitation Program and will admit {Time; today/tomorrow:10263}.  Preadmission Screen Completed By:  Genella Mech, 11/05/2021 3:19 PM ______________________________________________________________________   Discussed status with Dr. Marland Kitchen on *** at *** and received approval for admission today.  Admission Coordinator:  Genella Mech, CCC-SLP, time Marland KitchenSudie Grumbling ***   Assessment/Plan: Diagnosis: Does the need for close, 24 hr/day Medical supervision in concert with the patient's rehab needs make it unreasonable for this patient to be served in a less intensive setting? {yes_no_potentially:3041433} Co-Morbidities requiring supervision/potential complications: *** Due to {due HC:6237628}, does the patient require 24 hr/day rehab nursing? {yes_no_potentially:3041433} Does the patient require coordinated care of a physician, rehab nurse, PT, OT, and SLP to address physical and functional deficits in the context of the above medical diagnosis(es)? {yes_no_potentially:3041433} Addressing deficits in the following areas: {deficits:3041436} Can the patient actively participate in an intensive therapy program of at least 3 hrs of therapy 5 days a week? {yes_no_potentially:3041433} The potential for patient  to make measurable gains while on inpatient rehab is {potential:3041437} Anticipated functional outcomes upon discharge from inpatient rehab: {functional outcomes:304600100} PT, {functional outcomes:304600100} OT, {functional outcomes:304600100} SLP Estimated rehab length of stay to reach the above functional goals is: *** Anticipated discharge destination: {anticipated dc setting:21604} 10. Overall Rehab/Functional Prognosis:  {potential:3041437}   MD Signature: ***

## 2021-11-05 NOTE — Progress Notes (Addendum)
Inpatient Rehab Admissions Coordinator:   Met with patient and spouse at bedside to discuss potential CIR admission. Pt. Stated interest and indicates family can provide 24/7 support. I  Will open a case with her insurance once off Cleviprex and pursue for admission pending insurance auth, bed availability, and medical readiness.   Clemens Catholic, [place], [place] Admissions Coordinator  [phone] ([place]) [phone] (office)

## 2021-11-05 NOTE — Progress Notes (Signed)
Physical Therapy Treatment Patient Details Name: Denise Herrera MRN: [id number] DOB: March 29, 1971 Today's Date: 11/05/2021   History of Present Illness Denise Herrera is a 51 y.o. female admitted 11/03/21 with weakness and aphasia. 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 MRI 6/20: "Acute parenchymal hemorrhage with involvement of the left thalamus and adjacent white matter with intraventricular extension. Mild  edema and mass effect." PMH: HTN, narcolepsy, migraines, chronic insomnia    PT Comments    Pt is making gradual progress with PT, seeming to be able to follow more commands and attend to her R side when provided multi-modal cues to do so. However, she remains inconsistent and delayed in following commands and needs stimulation to remain awake at times. She continues to display R inattention and R sided weakness that impacts her safety and independence with mobility. Focused session on facilitating R UE and R leg muscular activation and coordination through weight bearing and trying to initiate gait training and through seated exercises. Will continue to follow acutely. Current recommendations remain appropriate.      Recommendations for follow up therapy are one component of a multi-disciplinary discharge planning process, led by the attending physician.  Recommendations may be updated based on patient status, additional functional criteria and insurance authorization.  Follow Up Recommendations  Acute inpatient rehab (3hours/day)     Assistance Recommended at Discharge Frequent or constant Supervision/Assistance  Patient can return home with the following Two people to help with walking and/or transfers;Assistance with cooking/housework;A lot of help with bathing/dressing/bathroom;Assist for transportation;Direct supervision/assist for medications management;Help with stairs or ramp for entrance;Direct supervision/assist for financial management   Equipment Recommendations  Other (comment) (TBA)     Recommendations for Other Services       Precautions / Restrictions Precautions Precautions: Fall Precaution Comments: R hemiparesis, R inattention Restrictions Weight Bearing Restrictions: No     Mobility  Bed Mobility Overal bed mobility: Needs Assistance Bed Mobility: Sidelying to Sit   Sidelying to sit: Mod assist       General bed mobility comments: Pt already on her R side upon arrival, cuing pt to bring legs off EOB but poor initiation noted. Pt needing assistance to bring legs off bed, especially her R as she tried to sit up before her leg was off the bed. ModA to then assist trunk up to sit.    Transfers Overall transfer level: Needs assistance Equipment used: Rolling walker (2 wheels), 1 person hand held assist Transfers: Sit to/from Stand, Bed to chair/wheelchair/BSC Sit to Stand: Mod assist   Step pivot transfers: Max assist       General transfer comment: Pt coming to stand 2x from EOB to PT anterior to her and 1x from recliner to RW, needing cues for R hand and foot placement and R knee block, modA to power up and steady. MaxA to steady pt, shift weight laterally, and cue/assist R leg to step to R and R knee to extend in stance to transfer bed > recliner with UE support on PT anterior to her. Pt sitting prematurely thus needing maxA to direct buttocks safely to chair.    Ambulation/Gait Ambulation/Gait assistance: Max assist Gait Distance (Feet): 1 Feet Assistive device: Rolling walker (2 wheels), 1 person hand held assist Gait Pattern/deviations: Step-to pattern, Decreased step length - right, Decreased stride length, Decreased dorsiflexion - right, Decreased weight shift to left, Decreased weight shift to right, Knees buckling, Trunk flexed, Narrow base of support Gait velocity: reduced Gait velocity interpretation: <1.31 ft/sec, indicative of  household ambulator   General Gait Details: Pt needing maxA to steady, cue weight shifting, block R knee during  stance due to buckling, and cue/assist R foot to step 1x to R from bed > recliner with UE support on PT anterior to her and 1x in front of recliner with 2nd rep using RW.   Stairs             Wheelchair Mobility    Modified Rankin (Stroke Patients Only) Modified Rankin (Stroke Patients Only) Pre-Morbid Rankin Score: No symptoms Modified Rankin: Severe disability     Balance Overall balance assessment: Needs assistance Sitting-balance support: Single extremity supported, Feet supported Sitting balance-Leahy Scale: Poor Sitting balance - Comments: Pt needing min guard-minA to sit statically EOB with L UE support   Standing balance support: Bilateral upper extremity supported, Single extremity supported, During functional activity Standing balance-Leahy Scale: Poor Standing balance comment: Reliant on UE support and external assistance                            Cognition Arousal/Alertness: Lethargic Behavior During Therapy: Flat affect, Impulsive Overall Cognitive Status: Impaired/Different from baseline Area of Impairment: Safety/judgement, Problem solving, Following commands, Attention, Memory, Awareness                   Current Attention Level: Sustained Memory: Decreased short-term memory Following Commands: Follows one step commands inconsistently, Follows one step commands with increased time Safety/Judgement: Decreased awareness of safety, Decreased awareness of deficits Awareness: Intellectual Problem Solving: Slow processing, Requires verbal cues, Decreased initiation, Difficulty sequencing, Requires tactile cues General Comments: Pt lethargic, but able to stay awake with stimulation. Pt with R inattention, needing repeated multi-modal cues to attend to R side of body, inconsistently following commands with R side. Pt needing increased time to process cues and to initiate. Poor sequencing, needing extensive assistance for stepping. Impulsive to  sit prematurely.        Exercises General Exercises - Upper Extremity Shoulder Flexion: AROM, AAROM, Both, 10 reps, Seated (AAROM on R, AROM on L) General Exercises - Lower Extremity Ankle Circles/Pumps: AROM, AAROM, Both, 10 reps, Seated (AAROM on R, AROM on L) Long Arc Quad: AROM, Strengthening, Both, Other reps (comment), Seated (x10 on R, x3 on L) Hip Flexion/Marching: AROM, AAROM, Both, 10 reps, Seated (AAROM on R, AROM on L)    General Comments General comments (skin integrity, edema, etc.): BP 155/100 end of session sitting in recliner      Pertinent Vitals/Pain Pain Assessment Pain Assessment: Faces Faces Pain Scale: No hurt Pain Intervention(s): Monitored during session    Home Living                          Prior Function            PT Goals (current goals can now be found in the care plan section) Acute Rehab PT Goals Patient Stated Goal: to get warm PT Goal Formulation: With patient Time For Goal Achievement: 11/18/21 Potential to Achieve Goals: Good Progress towards PT goals: Progressing toward goals    Frequency    Min 4X/week      PT Plan Current plan remains appropriate    Co-evaluation              AM-PAC PT "6 Clicks" Mobility   Outcome Measure  Help needed turning from your back to your side while in a flat bed without  using bedrails?: A Lot Help needed moving from lying on your back to sitting on the side of a flat bed without using bedrails?: A Lot Help needed moving to and from a bed to a chair (including a wheelchair)?: A Lot Help needed standing up from a chair using your arms (e.g., wheelchair or bedside chair)?: A Lot Help needed to walk in hospital room?: Total Help needed climbing 3-5 steps with a railing? : Total 6 Click Score: 10    End of Session Equipment Utilized During Treatment: Gait belt Activity Tolerance: Patient tolerated treatment well Patient left: with call bell/phone within reach;in chair;with  chair alarm set Nurse Communication: Mobility status;Other (comment);Need for lift equipment (BP) PT Visit Diagnosis: Other abnormalities of gait and mobility (R26.89);Other symptoms and signs involving the nervous system (R29.898);Hemiplegia and hemiparesis;Unsteadiness on feet (R26.81);Muscle weakness (generalized) (M62.81);Difficulty in walking, not elsewhere classified (R26.2) Hemiplegia - Right/Left: Right Hemiplegia - dominant/non-dominant: Dominant Hemiplegia - caused by: Nontraumatic intracerebral hemorrhage     Time: 1430-1452 PT Time Calculation (min) (ACUTE ONLY): 22 min  Charges:  $Neuromuscular Re-education: 8-22 mins                     Raymond Gurney, PT, DPT Acute Rehabilitation Services  Office: [phone]    Jewel Baize 11/05/2021, 3:06 PM

## 2021-11-06 DIAGNOSIS — I610 Nontraumatic intracerebral hemorrhage in hemisphere, subcortical: Secondary | ICD-10-CM | POA: Diagnosis not present

## 2021-11-06 LAB — BASIC METABOLIC PANEL
Anion gap: 11 (ref 5–15)
BUN: 25 mg/dL — ABNORMAL HIGH (ref 6–20)
CO2: 27 mmol/L (ref 22–32)
Calcium: 10.1 mg/dL (ref 8.9–10.3)
Chloride: 103 mmol/L (ref 98–111)
Creatinine, Ser: 1.14 mg/dL — ABNORMAL HIGH (ref 0.44–1.00)
GFR, Estimated: 58 mL/min — ABNORMAL LOW (ref >60)
Glucose, Bld: 123 mg/dL — ABNORMAL HIGH (ref 70–99)
Potassium: 3.9 mmol/L (ref 3.5–5.1)
Sodium: 141 mmol/L (ref 135–145)

## 2021-11-06 MED ORDER — IRBESARTAN 150 MG PO TABS
150.0000 mg | ORAL_TABLET | Freq: Every day | ORAL | Status: DC
  Administered 2021-11-06 – 2021-11-07 (×2): 150 mg via ORAL
  Filled 2021-11-06 (×2): qty 1

## 2021-11-06 NOTE — Progress Notes (Signed)
IP rehab admissions - Patient asleep when I rounded.  Per nurse, patient is off all IV drips and is ready for transfer out of the ICU.  I will contact insurance carrier and open the case to request acute inpatient rehab admission.  I will have my partner follow up tomorrow.  Call for questions.  [phone]

## 2021-11-06 NOTE — Progress Notes (Signed)
Occupational Therapy Treatment Patient Details Name: Denise Herrera MRN: [id number] DOB: 16-Sep-1970 Today's Date: 11/06/2021   History of present illness Denise Herrera is a 51 y.o. female admitted 11/03/21 with weakness and aphasia. 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 MRI 6/20: "Acute parenchymal hemorrhage with involvement of the left thalamus and adjacent white matter with intraventricular extension. Mild  edema and mass effect." PMH: HTN, narcolepsy, migraines, chronic insomnia   OT comments  Patient continues with deficits listed below, impacting independence.  R upper extremity AROM is improving, but she is defaulting to using her left hand/arm, and needing cues and AA to incorporate her LR arm.  Patient able to transfer to the recliner with +2 max A.  OT will continue efforts in the acute setting, and AIR continues to be recommended for intensive post acute therapies prior to returning home.     Recommendations for follow up therapy are one component of a multi-disciplinary discharge planning process, led by the attending physician.  Recommendations may be updated based on patient status, additional functional criteria and insurance authorization.    Follow Up Recommendations  Acute inpatient rehab (3hours/day)    Assistance Recommended at Discharge Frequent or constant Supervision/Assistance  Patient can return home with the following  A lot of help with walking and/or transfers;A lot of help with bathing/dressing/bathroom;Assistance with cooking/housework;Direct supervision/assist for medications management;Assist for transportation;Help with stairs or ramp for entrance   Equipment Recommendations       Recommendations for Other Services      Precautions / Restrictions Precautions Precautions: Fall Precaution Comments: R hemiparesis, R inattention Restrictions Weight Bearing Restrictions: No       Mobility Bed Mobility Overal bed mobility: Needs Assistance Bed Mobility: Supine to  Sit     Supine to sit: Mod assist          Transfers Overall transfer level: Needs assistance Equipment used: 2 person hand held assist Transfers: Sit to/from Stand, Bed to chair/wheelchair/BSC Sit to Stand: Min assist, +2 physical assistance, +2 safety/equipment     Step pivot transfers: Max assist, +2 physical assistance, +2 safety/equipment           Balance Overall balance assessment: Needs assistance Sitting-balance support: Single extremity supported, Feet supported, No upper extremity supported Sitting balance-Leahy Scale: Poor   Postural control: Left lateral lean Standing balance support: Bilateral upper extremity supported, Single extremity supported, During functional activity Standing balance-Leahy Scale: Poor                             ADL either performed or assessed with clinical judgement   ADL       Grooming: Moderate assistance;Sitting;Minimal assistance               Lower Body Dressing: Maximal assistance;+2 for physical assistance;+2 for safety/equipment;Sit to/from stand                      Extremity/Trunk Assessment Upper Extremity Assessment Upper Extremity Assessment: RUE deficits/detail RUE Deficits / Details: AROM is improving.  AA for hand to mouth in seated position. RUE Sensation: decreased proprioception;decreased light touch RUE Coordination: decreased fine motor;decreased gross motor LUE Deficits / Details: Patient does not flinch to nail bed pinch.   Lower Extremity Assessment Lower Extremity Assessment: Defer to PT evaluation   Cervical / Trunk Assessment Cervical / Trunk Assessment: Normal    Vision   Vision Assessment?: Vision impaired- to be further tested in  functional context Additional Comments: improved tracking to R noted, but L preference remains   Perception Perception Perception: Impaired Inattention/Neglect: Does not attend to right side of body   Praxis      Cognition  Arousal/Alertness: Awake/alert Behavior During Therapy: Flat affect, Impulsive, Restless Overall Cognitive Status: Impaired/Different from baseline                     Current Attention Level: Sustained Memory: Decreased short-term memory Following Commands: Follows one step commands inconsistently, Follows one step commands with increased time Safety/Judgement: Decreased awareness of safety, Decreased awareness of deficits   Problem Solving: Slow processing, Requires verbal cues, Decreased initiation, Difficulty sequencing, Requires tactile cues                       General Comments VSS on RA    Pertinent Vitals/ Pain       Pain Assessment Pain Assessment: Faces Faces Pain Scale: No hurt Pain Intervention(s): Monitored during session                                                                     Progress Toward Goals  OT Goals(current goals can now be found in the care plan section)  Progress towards OT goals: Progressing toward goals  Acute Rehab OT Goals OT Goal Formulation: With patient Time For Goal Achievement: 11/18/21 Potential to Achieve Goals: Good  Plan Discharge plan remains appropriate    Co-evaluation    PT/OT/SLP Co-Evaluation/Treatment: Yes Reason for Co-Treatment: Complexity of the patient's impairments (multi-system involvement);Necessary to address cognition/behavior during functional activity;For patient/therapist safety;To address functional/ADL transfers PT goals addressed during session: Mobility/safety with mobility;Balance;Strengthening/ROM OT goals addressed during session: ADL's and self-care      AM-PAC OT "6 Clicks" Daily Activity     Outcome Measure   Help from another person eating meals?: A Little Help from another person taking care of personal grooming?: A Little Help from another person toileting, which includes using toliet, bedpan, or urinal?: A Lot Help from another person  bathing (including washing, rinsing, drying)?: A Lot Help from another person to put on and taking off regular upper body clothing?: A Lot Help from another person to put on and taking off regular lower body clothing?: A Lot 6 Click Score: 14    End of Session Equipment Utilized During Treatment: Gait belt  OT Visit Diagnosis: Unsteadiness on feet (R26.81);Other abnormalities of gait and mobility (R26.89);Repeated falls (R29.6);Muscle weakness (generalized) (M62.81);Pain;Hemiplegia and hemiparesis Hemiplegia - Right/Left: Right Hemiplegia - dominant/non-dominant: Dominant Hemiplegia - caused by: Nontraumatic intracerebral hemorrhage   Activity Tolerance Patient tolerated treatment well   Patient Left in chair;with call bell/phone within reach;with chair alarm set   Nurse Communication Mobility status        Time: 6720-9470 OT Time Calculation (min): 30 min  Charges: OT General Charges $OT Visit: 1 Visit OT Treatments $Self Care/Home Management : 8-22 mins  11/06/2021  RP, OTR/L  Acute Rehabilitation Services  Office:  [phone]   Suzanna Obey 11/06/2021, 2:27 PM

## 2021-11-06 NOTE — Progress Notes (Signed)
Speech Language Pathology Treatment: Cognitive-Linquistic  Patient Details Name: Denise Herrera MRN: [id number] DOB: Apr 14, 1971 Today's Date: 11/06/2021 Time: 1205-1219 SLP Time Calculation (min) (ACUTE ONLY): 14 min  Assessment / Plan / Recommendation Clinical Impression  Pt was seen for treatment. She was lethargic at the beginning of the session, but this improved with additional time and when her friend arrived. She demonstrated 25% accuracy with responsive naming increasing to 75% with cues. She exhibited difficulty with reading comprehension at the sentence level which improved with cues. Pt was educated regarding the nature of dysarthria, and compensatory strategies to improve speech intelligibility. Pt was educated on strategies to improved word retrieval and she required verbal prompts for use of these at the conversational level. Pt verbalized understanding regarding all areas of education; reinforcement will be needed due to receptive language impairments. She used compensatory strategies for speech intelligibility at the phrase level with 75% accuracy increasing to 100% accuracy with cues for vocal intensity. Pt's friend arrived during the session and was educated regarding strategies to improve auditory comprehension, facilitate communication with the pt, and to reduce the pt's frustration. Pt's friend verbalized understanding and her friend was noted to use some of the strategies during the session. SLP will continue to follow pt.     HPI HPI: DAVIDA FALCONI is a 51 y.o. female with a history of hypertension who developed R sided numbness at work followied by weakness and aphasia but was eventually able to call for help.  On arrival to the ED, she has expressive aphasia but responds to her name and is able to follow commands. 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 MRI 6/20: "Acute parenchymal hemorrhage with involvement of the left thalamus  and adjacent white matter with intraventricular extension. Mild   edema and mass effect."      SLP Plan  Continue with current plan of care      Recommendations for follow up therapy are one component of a multi-disciplinary discharge planning process, led by the attending physician.  Recommendations may be updated based on patient status, additional functional criteria and insurance authorization.    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 Vear Clock, MS, CCC-SLP Acute Rehabilitation Services Office number [phone] Pager [phone]   Scheryl Marten  11/06/2021, 3:37 PM

## 2021-11-06 NOTE — Progress Notes (Signed)
Physical Therapy Treatment Patient Details Name: Denise Herrera MRN: [id number] DOB: [birth year] Today's Date: 11/06/2021   History of Present Illness Denise Herrera is a 51 y.o. female admitted 11/03/21 with weakness and aphasia. 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 MRI 6/20: "Acute parenchymal hemorrhage with involvement of the left thalamus and adjacent white matter with intraventricular extension. Mild  edema and mass effect." PMH: HTN, narcolepsy, migraines, chronic insomnia    PT Comments    Pt was more awake today and displaying emerging awareness that her R side was weaker and with sensation deficits. However, pt still displaying poor sequencing, safety awareness, and R upper and lower extremity coordination/strength. Pt displayed increased R UE active movement but displayed minimal if any R lower extremity activation today, requiring maxAx2 for stand step transfers to the R to manage her R leg with swing and stance phase and stabilize pt with weight shifting. Will continue to follow acutely. Current recommendations remain appropriate.ZSp     Recommendations for follow up therapy are one component of a multi-disciplinary discharge planning process, led by the attending physician.  Recommendations may be updated based on patient status, additional functional criteria and insurance authorization.  Follow Up Recommendations  Acute inpatient rehab (3hours/day)     Assistance Recommended at Discharge Frequent or constant Supervision/Assistance  Patient can return home with the following Two people to help with walking and/or transfers;Assistance with cooking/housework;A lot of help with bathing/dressing/bathroom;Assist for transportation;Direct supervision/assist for medications management;Help with stairs or ramp for entrance;Direct supervision/assist for financial management   Equipment Recommendations  Other (comment) (TBA)    Recommendations for Other Services       Precautions / Restrictions  Precautions Precautions: Fall Precaution Comments: R hemiparesis, R inattention Restrictions Weight Bearing Restrictions: No     Mobility  Bed Mobility Overal bed mobility: Needs Assistance Bed Mobility: Supine to Sit     Supine to sit: Mod assist     General bed mobility comments: Cues to manage legs to her R side, but little to no assistance by pt for her R leg but able to follow cues to bring L leg to R. Cues to grab bed rail with L UE and then push up from bed, modA to control trunk and manage R leg.    Transfers Overall transfer level: Needs assistance Equipment used: 2 person hand held assist Transfers: Sit to/from Stand, Bed to chair/wheelchair/BSC Sit to Stand: Min assist, +2 physical assistance, +2 safety/equipment   Step pivot transfers: Max assist, +2 physical assistance, +2 safety/equipment       General transfer comment: R knee blocked with cues to push up with R hand off bed or on R thigh, x3 mini buttocks clearance reps then x1 full sit to stand with bil HHA for support, minAx2 to power up to stand. MaxAx2 for stand step to R bed > recliner to steady pt, facilitate weight shifting, advance R leg to step, and block R knee but hyperextension noted with R stance.    Ambulation/Gait Ambulation/Gait assistance: Max assist, +2 physical assistance, +2 safety/equipment Gait Distance (Feet): 3 Feet Assistive device: 2 person hand held assist Gait Pattern/deviations: Step-to pattern, Decreased step length - right, Decreased stride length, Decreased dorsiflexion - right, Decreased weight shift to left, Decreased weight shift to right, Knees buckling, Trunk flexed, Narrow base of support, Knee hyperextension - right Gait velocity: reduced Gait velocity interpretation: <1.31 ft/sec, indicative of household ambulator   General Gait Details: MaxAx2 for stand step to R bed > recliner  to steady pt, facilitate weight shifting, advance R leg to step, and block R knee but  hyperextension noted with R stance. Minimal to no activation by pt to advance R leg   Stairs             Wheelchair Mobility    Modified Rankin (Stroke Patients Only) Modified Rankin (Stroke Patients Only) Pre-Morbid Rankin Score: No symptoms Modified Rankin: Severe disability     Balance Overall balance assessment: Needs assistance Sitting-balance support: Single extremity supported, Feet supported, No upper extremity supported Sitting balance-Leahy Scale: Poor Sitting balance - Comments: Pt with L lateral lean, needing verbal and tactile cues to sit upright, elevate head, and correct L tilt/flexion. Pt preferring L UE support on bed rail but able to place hands in lap with min guard-minA. Propped pt on R elbow 5x to facilitate R UE weight bearing and activation, pt pushing up from R elbow with R hand 5x with minA. Postural control: Left lateral lean Standing balance support: Bilateral upper extremity supported, Single extremity supported, During functional activity Standing balance-Leahy Scale: Poor Standing balance comment: Reliant on UE support and external assistance                            Cognition Arousal/Alertness: Awake/alert Behavior During Therapy: Flat affect, Impulsive, Restless Overall Cognitive Status: Impaired/Different from baseline Area of Impairment: Safety/judgement, Problem solving, Following commands, Attention, Memory, Awareness, Orientation                 Orientation Level: Disoriented to, Situation, Time (recalls she is in a hospital in [place] and that it is summer; unaware of month and situation) Current Attention Level: Sustained Memory: Decreased short-term memory Following Commands: Follows one step commands inconsistently, Follows one step commands with increased time Safety/Judgement: Decreased awareness of safety, Decreased awareness of deficits Awareness: Intellectual Problem Solving: Slow processing, Requires  verbal cues, Decreased initiation, Difficulty sequencing, Requires tactile cues General Comments: Pt disoriented to situation even though therapist told her earlier in the session. Does recall being told she was in a hospital and that it is summer in [place]. Cannot recall what the 6th month is, but can identify what month comes after May. Pt beginning to be aware that her R side is weaker or has less sensation, but needs repeated multi-modal cues to attend to it and sequence its movement/placement. Poor safety awareness, often trying to stand or scoot anteriorly impulsively. Initially, very restless and stating she was going to pull her IV out and perseverating on saying "hi", but calmed down with time, RN aware.        Exercises Other Exercises Other Exercises: propping on R elbow and pushing up off R hand while seated EOB to facilitate triceps activation/strengthening x5 Other Exercises: attempted LAQ with R leg, but no success today    General Comments General comments (skin integrity, edema, etc.): VSS on RA      Pertinent Vitals/Pain Pain Assessment Pain Assessment: Faces Faces Pain Scale: Hurts a little bit Pain Location: generalized grimacing with mobility Pain Descriptors / Indicators: Grimacing Pain Intervention(s): Limited activity within patient's tolerance, Monitored during session, Repositioned    Home Living                          Prior Function            PT Goals (current goals can now be found in the care plan section)  Acute Rehab PT Goals Patient Stated Goal: agreeable to session PT Goal Formulation: With patient Time For Goal Achievement: 11/18/21 Potential to Achieve Goals: Good Progress towards PT goals: Progressing toward goals    Frequency    Min 4X/week      PT Plan Current plan remains appropriate    Co-evaluation PT/OT/SLP Co-Evaluation/Treatment: Yes Reason for Co-Treatment: Complexity of the patient's impairments  (multi-system involvement);Necessary to address cognition/behavior during functional activity;For patient/therapist safety;To address functional/ADL transfers PT goals addressed during session: Mobility/safety with mobility;Balance;Strengthening/ROM        AM-PAC PT "6 Clicks" Mobility   Outcome Measure  Help needed turning from your back to your side while in a flat bed without using bedrails?: A Lot Help needed moving from lying on your back to sitting on the side of a flat bed without using bedrails?: A Lot Help needed moving to and from a bed to a chair (including a wheelchair)?: Total Help needed standing up from a chair using your arms (e.g., wheelchair or bedside chair)?: Total Help needed to walk in hospital room?: Total Help needed climbing 3-5 steps with a railing? : Total 6 Click Score: 8    End of Session Equipment Utilized During Treatment: Gait belt Activity Tolerance: Patient tolerated treatment well Patient left: with call bell/phone within reach;in chair;with chair alarm set Nurse Communication: Mobility status;Need for lift equipment PT Visit Diagnosis: Other abnormalities of gait and mobility (R26.89);Other symptoms and signs involving the nervous system (R29.898);Hemiplegia and hemiparesis;Unsteadiness on feet (R26.81);Muscle weakness (generalized) (M62.81);Difficulty in walking, not elsewhere classified (R26.2) Hemiplegia - Right/Left: Right Hemiplegia - dominant/non-dominant: Dominant Hemiplegia - caused by: Nontraumatic intracerebral hemorrhage     Time: 1157-2620 PT Time Calculation (min) (ACUTE ONLY): 31 min  Charges:  $Neuromuscular Re-education: 8-22 mins                     Raymond Gurney, PT, DPT Acute Rehabilitation Services  Office: [phone]    Jewel Baize 11/06/2021, 11:54 AM

## 2021-11-06 NOTE — Progress Notes (Addendum)
STROKE TEAM PROGRESS NOTE   INTERVAL HISTORY: Denise Herrera weaned off in past 24 hours. Still requiring PRN antihypertensives with titration ongoing.  Stable neurologically and hemodynamically overnight. No visitors at bedside on rounds. Discussed patient's progress with her at the bedside. Plan for transfer out to floor and hopefully to IPR soon were explained. IPR team has submitted for insurance approval. Her questions were answered.  Neurologic: Exam is unchanged.  Vital signs are stable. Vitals:   11/06/21 0500 11/06/21 0600 11/06/21 0700 11/06/21 0800  BP: (!) 145/86 (!) 150/75 (!) 143/73 (!) 143/85  Pulse: 72 71 61   Resp: 14 18 19 19   Temp:      TempSrc:      SpO2: 99% 99% 98% 97%  Weight:      Height:       CBC:  Recent Labs  Lab 11/03/21 1545 11/03/21 1550  WBC 6.1  --   NEUTROABS 2.6  --   HGB 13.8 14.6  HCT 42.6 43.0  MCV 92.6  --   PLT 353  --    Basic Metabolic Panel:  Recent Labs  Lab 11/03/21 1545 11/03/21 1550  NA 138 138  K 3.6 3.5  CL 105 104  CO2 25  --   GLUCOSE 96 92  BUN 18 20  CREATININE 1.15* 1.20*  CALCIUM 9.5  --    Lipid Panel:  Recent Labs  Lab 11/03/21 1851  CHOL 225*  TRIG 59  HDL 71  CHOLHDL 3.2  VLDL 12  LDLCALC 161*   HgbA1c:  Recent Labs  Lab 11/03/21 1851  HGBA1C 5.3   Urine Drug Screen: No results for input(s): "LABOPIA", "COCAINSCRNUR", "LABBENZ", "AMPHETMU", "THCU", "LABBARB" in the last 168 hours.  Alcohol Level  Recent Labs  Lab 11/03/21 1545  ETH <10    IMAGING past 24 hours No results found.  PHYSICAL EXAM Physical Exam  Constitutional: Appears well-developed and well-nourished middle-age African-American lady.  Psych: Affect appropriate to situation Eyes: No scleral injection HENT: No OP obstrucion Head: Normocephalic. Skin: WDI   Neuro: Mental Status: Patient is initially sleeping but arouses easily to first name to a responsive and oriented state  Mild expressive and mild receptive aphasia  and dysarthria present Cranial Nerves: II: PERRL III,IV, VI: Left gaze preference but able to overcome V: Facial sensation is symmetric to light touch VII: Right sided facial droop VIII: hearing is intact to voice XII: tongue is midline without atrophy or fasciculations.  Motor: Tone is normal. Bulk is normal. 5/5 strength to LUE and LLE, 3/5 to RUE with significant weakness of the right grip and intrinsic hand muscles.  And 3/5 to RLE Sensory: Sensation is symmetric to light touch and in the arms and legs. Plantars: Toes are downgoing bilaterally.     ASSESSMENT/PLAN Denise Herrera is a 51 y.o. female with history of hypertension who was at work today and called her husband at 1330 complaining of right sided numbness.  Her co-workers say this was the last time they saw her in a normal state.  She then went to the bathroom at work, developed right sided weakness and aphasia and eventually was able to call for help.  On arrival to the ED, she has expressive aphasia but responds to her name and is able to follow commands.  RUE is flaccid and RLE has 2/5 strength.  She has no history of anticoagulant use.  Blood pressure was only mildly elevated at 141/85.    ICH (intracerebral hemorrhage)  6/20- MRI brain- Acute parenchymal hemorrhage with involvement of the left thalamus and adjacent white matter with intraventricular extension. Mild edema and mass effect. Limited evaluation for underlying lesion this non contrast study. No evidence of prior hemorrhage. 6/19-CT head- Acute parenchymal hemorrhage in the left corona radiata and thalamocapsular region with intraventricular extension. No significant mass effect or hydrocephalus. 6/19- CTA head & neck - No aneurysm, AVM, or other significant vascular abnormality. 6/20- Repeat CTH-Persistent left parenchymal hemorrhage as described which has increased in greatest dimension on coronal imaging from 3.2-3.8 cm.The some slight increase in mass effect  upon the left lateral ventricle is noted although no significant midline shift is seen. No new hydrocephalus is seen. 2D Echo- EF 65-70% and no shunt LDL 142 HgbA1c 5.3 Continue heart healthy/carb modified VTE prophylaxis - none due to ICH IPR recommended by therapy teams. Insurance Berkley Harvey is PENDING Transfer to floor today   Hypertension Home meds:  Micardis 40, Bystolic 5mg   Improving, now weaned off Denise Herrera, Ibesartan formulary substitution added 6/21. Bystolic home dose on board. Continue PRN IV labetolol and hydralazine, continue to monitor  Permissive hypertension (OK if < 220/120) but gradually normalize in 5-7 days Long-term BP goal normotensive  Hyperlipidemia Home meds:  Lipitor 20mg  PO and Fish oil 1 capsule daily. Lipitor ordered inpatient and will resume fish oil as well on discharge.  LDL 142, goal < 70 Resume fish oil at discharge  Continue statin at discharge  Glucose Management  Home meds:  None  HgbA1c 5.3, goal < 7.0 Heart healthy diet   Other Active Problems   Hospital day # 3       Denise A. Bailey-Modzik, NP-C [facility] See Amion for schedule and pager information   STROKE MD NOTE :  I have personally obtained history,examined this patient, reviewed notes, independently viewed imaging studies, participated in medical decision making and plan of care.ROS completed by me personally and pertinent positives fully documented  I have made any additions or clarifications directly to the above note. Agree with note above.  Continue to wean off Denise Herrera drip.  Use as needed IV labetalol and hydralazine and increase p.o. medications.  Consider bed later today.  Mobilize out of bed.  Continue ongoing therapies and rehab consult.  Long discussion with patient and mother at the bedside and answered questions.This patient is critically ill and at significant risk of neurological worsening, death and care requires constant monitoring of vital signs,  hemodynamics,respiratory and cardiac monitoring, extensive review of multiple databases, frequent neurological assessment, discussion with family, other specialists and medical decision making of high complexity.I have made any additions or clarifications directly to the above note.This critical care time does not reflect procedure time, or teaching time or supervisory time of PA/NP/Med Resident etc but could involve care discussion time.  I spent 30 minutes of neurocritical care time  in the care of  this patient.     Denise Heady, MD Medical Director [facility] Pager: [phone] 11/06/2021 3:43 PM  To contact Stroke Continuity provider, please refer to [email or web address]. After hours, contact General Neurology

## 2021-11-07 DIAGNOSIS — I610 Nontraumatic intracerebral hemorrhage in hemisphere, subcortical: Secondary | ICD-10-CM | POA: Diagnosis not present

## 2021-11-07 MED ORDER — METHYLPHENIDATE HCL 5 MG PO TABS
20.0000 mg | ORAL_TABLET | Freq: Two times a day (BID) | ORAL | Status: DC | PRN
  Administered 2021-11-07: 20 mg via ORAL
  Filled 2021-11-07: qty 4

## 2021-11-07 MED ORDER — LABETALOL HCL 5 MG/ML IV SOLN: 10.0000 mg | INTRAVENOUS | Status: DC | PRN

## 2021-11-07 MED ORDER — HYDRALAZINE HCL 20 MG/ML IJ SOLN: 20.0000 mg | Freq: Four times a day (QID) | INTRAMUSCULAR | Status: DC | PRN

## 2021-11-08 ENCOUNTER — Encounter (HOSPITAL_COMMUNITY): Payer: Self-pay | Admitting: Physical Medicine & Rehabilitation

## 2021-11-08 ENCOUNTER — Inpatient Hospital Stay (HOSPITAL_COMMUNITY)
Admission: RE | Admit: 2021-11-08 | Discharge: 2021-11-29 | DRG: 057 | Disposition: A | Discharge status: Home / Self Care | Payer: BLUE CROSS/BLUE SHIELD | Source: Intra-hospital | Attending: Physical Medicine & Rehabilitation | Admitting: Physical Medicine & Rehabilitation

## 2021-11-08 ENCOUNTER — Other Ambulatory Visit: Payer: Self-pay

## 2021-11-08 DIAGNOSIS — R001 Bradycardia, unspecified: Secondary | ICD-10-CM | POA: Diagnosis not present

## 2021-11-08 DIAGNOSIS — I619 Nontraumatic intracerebral hemorrhage, unspecified: Secondary | ICD-10-CM | POA: Diagnosis present

## 2021-11-08 DIAGNOSIS — Z885 Allergy status to narcotic agent status: Secondary | ICD-10-CM | POA: Diagnosis not present

## 2021-11-08 DIAGNOSIS — H832X3 Labyrinthine dysfunction, bilateral: Secondary | ICD-10-CM | POA: Diagnosis not present

## 2021-11-08 DIAGNOSIS — E785 Hyperlipidemia, unspecified: Secondary | ICD-10-CM | POA: Diagnosis present

## 2021-11-08 DIAGNOSIS — F09 Unspecified mental disorder due to known physiological condition: Secondary | ICD-10-CM | POA: Diagnosis present

## 2021-11-08 DIAGNOSIS — I10 Essential (primary) hypertension: Secondary | ICD-10-CM | POA: Diagnosis not present

## 2021-11-08 DIAGNOSIS — I69151 Hemiplegia and hemiparesis following nontraumatic intracerebral hemorrhage affecting right dominant side: Principal | ICD-10-CM

## 2021-11-08 DIAGNOSIS — G43909 Migraine, unspecified, not intractable, without status migrainosus: Secondary | ICD-10-CM | POA: Diagnosis not present

## 2021-11-08 DIAGNOSIS — Z888 Allergy status to other drugs, medicaments and biological substances status: Secondary | ICD-10-CM | POA: Diagnosis not present

## 2021-11-08 DIAGNOSIS — R42 Dizziness and giddiness: Secondary | ICD-10-CM | POA: Diagnosis not present

## 2021-11-08 DIAGNOSIS — R200 Anesthesia of skin: Secondary | ICD-10-CM | POA: Diagnosis not present

## 2021-11-08 DIAGNOSIS — I610 Nontraumatic intracerebral hemorrhage in hemisphere, subcortical: Secondary | ICD-10-CM | POA: Diagnosis not present

## 2021-11-08 DIAGNOSIS — I69120 Aphasia following nontraumatic intracerebral hemorrhage: Secondary | ICD-10-CM

## 2021-11-08 DIAGNOSIS — R414 Neurologic neglect syndrome: Secondary | ICD-10-CM | POA: Diagnosis present

## 2021-11-08 DIAGNOSIS — Z803 Family history of malignant neoplasm of breast: Secondary | ICD-10-CM

## 2021-11-08 DIAGNOSIS — H818X9 Other disorders of vestibular function, unspecified ear: Secondary | ICD-10-CM | POA: Diagnosis not present

## 2021-11-08 DIAGNOSIS — F909 Attention-deficit hyperactivity disorder, unspecified type: Secondary | ICD-10-CM | POA: Diagnosis present

## 2021-11-08 DIAGNOSIS — Z833 Family history of diabetes mellitus: Secondary | ICD-10-CM

## 2021-11-08 DIAGNOSIS — Z8249 Family history of ischemic heart disease and other diseases of the circulatory system: Secondary | ICD-10-CM

## 2021-11-08 DIAGNOSIS — R7989 Other specified abnormal findings of blood chemistry: Secondary | ICD-10-CM | POA: Diagnosis not present

## 2021-11-08 DIAGNOSIS — I161 Hypertensive emergency: Secondary | ICD-10-CM | POA: Diagnosis not present

## 2021-11-08 DIAGNOSIS — Z825 Family history of asthma and other chronic lower respiratory diseases: Secondary | ICD-10-CM

## 2021-11-08 DIAGNOSIS — G43809 Other migraine, not intractable, without status migrainosus: Secondary | ICD-10-CM | POA: Diagnosis not present

## 2021-11-08 DIAGNOSIS — Z79899 Other long term (current) drug therapy: Secondary | ICD-10-CM

## 2021-11-08 DIAGNOSIS — E7800 Pure hypercholesterolemia, unspecified: Secondary | ICD-10-CM | POA: Diagnosis not present

## 2021-11-08 DIAGNOSIS — I611 Nontraumatic intracerebral hemorrhage in hemisphere, cortical: Secondary | ICD-10-CM | POA: Diagnosis not present

## 2021-11-08 DIAGNOSIS — F5104 Psychophysiologic insomnia: Secondary | ICD-10-CM | POA: Diagnosis not present

## 2021-11-08 LAB — BASIC METABOLIC PANEL
Anion gap: 11 (ref 5–15)
BUN: 21 mg/dL — ABNORMAL HIGH (ref 6–20)
CO2: 26 mmol/L (ref 22–32)
Calcium: 9.7 mg/dL (ref 8.9–10.3)
Chloride: 102 mmol/L (ref 98–111)
Creatinine, Ser: 1.01 mg/dL — ABNORMAL HIGH (ref 0.44–1.00)
GFR, Estimated: >60 mL/min (ref >60)
Glucose, Bld: 95 mg/dL (ref 70–99)
Potassium: 3.5 mmol/L (ref 3.5–5.1)
Sodium: 139 mmol/L (ref 135–145)

## 2021-11-08 LAB — CBC
HCT: 49.3 % — ABNORMAL HIGH (ref 36.0–46.0)
Hemoglobin: 15.6 g/dL — ABNORMAL HIGH (ref 12.0–15.0)
MCH: 29.8 pg (ref 26.0–34.0)
MCHC: 31.6 g/dL (ref 30.0–36.0)
MCV: 94.1 fL (ref 80.0–100.0)
Platelets: 349 10*3/uL (ref 150–400)
RBC: 5.24 MIL/uL — ABNORMAL HIGH (ref 3.87–5.11)
RDW: 12.9 % (ref 11.5–15.5)
WBC: 7.5 10*3/uL (ref 4.0–10.5)
nRBC: 0 % (ref 0.0–0.2)

## 2021-11-08 MED ORDER — SENNOSIDES-DOCUSATE SODIUM 8.6-50 MG PO TABS
1.0000 | ORAL_TABLET | Freq: Two times a day (BID) | ORAL | Status: DC
  Administered 2021-11-08 – 2021-11-28 (×32): 1 via ORAL
  Filled 2021-11-08 (×40): qty 1

## 2021-11-08 MED ORDER — ACETAMINOPHEN 650 MG RE SUPP: 650.0000 mg | RECTAL | Status: DC | PRN

## 2021-11-08 MED ORDER — IRBESARTAN 300 MG PO TABS
300.0000 mg | ORAL_TABLET | Freq: Every day | ORAL | Status: DC
  Administered 2021-11-08: 300 mg via ORAL
  Filled 2021-11-08: qty 1

## 2021-11-08 MED ORDER — METHYLPHENIDATE HCL 5 MG PO TABS
5.0000 mg | ORAL_TABLET | Freq: Two times a day (BID) | ORAL | Status: DC
  Administered 2021-11-09 – 2021-11-29 (×41): 5 mg via ORAL
  Filled 2021-11-08 (×41): qty 1

## 2021-11-08 MED ORDER — ACETAMINOPHEN 325 MG PO TABS
650.0000 mg | ORAL_TABLET | ORAL | Status: DC | PRN
  Administered 2021-11-08 – 2021-11-26 (×6): 650 mg via ORAL
  Filled 2021-11-08 (×9): qty 2

## 2021-11-08 MED ORDER — ATORVASTATIN CALCIUM 10 MG PO TABS
20.0000 mg | ORAL_TABLET | Freq: Every day | ORAL | Status: DC
  Administered 2021-11-09 – 2021-11-29 (×21): 20 mg via ORAL
  Filled 2021-11-08 (×22): qty 2

## 2021-11-08 MED ORDER — TRAZODONE HCL 50 MG PO TABS: 50.0000 mg | ORAL_TABLET | Freq: Every evening | ORAL | Status: DC | PRN

## 2021-11-08 MED ORDER — NEBIVOLOL HCL 5 MG PO TABS
5.0000 mg | ORAL_TABLET | Freq: Every day | ORAL | Status: DC
  Administered 2021-11-09 – 2021-11-29 (×21): 5 mg via ORAL
  Filled 2021-11-08 (×21): qty 1

## 2021-11-08 MED ORDER — ACETAMINOPHEN 160 MG/5ML PO SOLN: 650.0000 mg | ORAL | Status: DC | PRN

## 2021-11-08 MED ORDER — IRBESARTAN 300 MG PO TABS
300.0000 mg | ORAL_TABLET | Freq: Every day | ORAL | Status: DC
  Administered 2021-11-09 – 2021-11-29 (×21): 300 mg via ORAL
  Filled 2021-11-08 (×21): qty 1

## 2021-11-08 MED ORDER — METHYLPHENIDATE HCL 5 MG PO TABS: 20.0000 mg | ORAL_TABLET | Freq: Two times a day (BID) | ORAL | Status: DC | PRN

## 2021-11-08 MED ORDER — PANTOPRAZOLE SODIUM 40 MG PO TBEC
40.0000 mg | DELAYED_RELEASE_TABLET | Freq: Every day | ORAL | Status: DC
  Administered 2021-11-09 – 2021-11-29 (×21): 40 mg via ORAL
  Filled 2021-11-08 (×21): qty 1

## 2021-11-08 NOTE — Discharge Instructions (Addendum)
Inpatient Rehab Discharge Instructions  TANVI GATLING Discharge date and time: No discharge date for patient encounter.   Activities/Precautions/ Functional Status: Activity: As tolerated Diet: Regular Wound Care: Routine skin checks Functional status:  ___ No restrictions     ___ Walk up steps independently ___ 24/7 supervision/assistance   ___ Walk up steps with assistance ___ Intermittent supervision/assistance  ___ Bathe/dress independently ___ Walk with walker     _x__ Bathe/dress with assistance ___ Walk Independently    ___ Shower independently ___ Walk with assistance    ___ Shower with assistance ___ No alcohol     ___ Return to work/school ________   COMMUNITY REFERRALS UPON DISCHARGE:    Outpatient: PT      OT     ST                  Agency:[facility]     [phone]              Appointment Date/Time:*Please expect follow-up within 7-10 business days to schedule your appointment. If you have not received follow-up, be sure to contact the site directly.*  Medical Equipment/Items Ordered: rolling walker, 3in1 bedside commode, tub transfer bench and wheelchair                                                 Agency/Supplier: [employer] [phone]   Special Instructions: No driving smoking or alcohol   My questions have been answered and I understand these instructions. I will adhere to these goals and the provided educational materials after my discharge from the hospital.  Patient/Caregiver Signature _______________________________ Date __________  Clinician Signature _______________________________________ Date __________  Please bring this form and your medication list with you to all your follow-up doctor's appointments.

## 2021-11-08 NOTE — Discharge Summary (Signed)
Physician Discharge Summary   LEVITA FIGEROA AOZ:[id number] DOB: 1971-01-23 DOA: 11/03/2021  PCP: Tally Joe, MD  Admit date: 11/03/2021 Discharge date: 11/08/2021  Admitted From: Home Disposition:  CIR Discharging physician: Lewie Chamber, MD  Recommendations for Outpatient Follow-up:  Follow up with neurology   Home Health:  Equipment/Devices:   Discharge Condition: stable CODE STATUS: Full Diet recommendation:  Diet Orders (From admission, onward)     Start     Ordered   11/05/21 0836  Diet heart healthy/carb modified Room service appropriate? Yes with Assist; Fluid consistency: Thin  Diet effective now       Question Answer Comment  Diet-HS Snack? Nothing   Room service appropriate? Yes with Assist   Fluid consistency: Thin      11/05/21 0836            Hospital Course:  Ms. Harrigan is a 51 yo female with PMH HTN who presented with right sided numbness, expressive aphasia.  CT head showed acute parenchymal hemorrhage involving left corona radiata and thalamocapsular region with intraventricular extension. She underwent work-up with neurology.  CT angio head/neck negative for aneurysm, AVM, or significant vascular abnormality.  She was recommended to continue on strict blood pressure control.  Blood pressure medications were continued at discharge to rehab. For hyperlipidemia, statin also continued.   Principal Diagnosis: ICH (intracerebral hemorrhage) (HCC)  Discharge Diagnoses: Principal Problem:   ICH (intracerebral hemorrhage) (HCC)    Allergies as of 11/08/2021       Reactions   Hydrochlorothiazide Other (See Comments)   Fatigue Laziness    Lodine [etodolac] Itching   Norvasc [amlodipine] Other (See Comments)   Dizziness Disequilibrium Fatigue   Nuvigil [armodafinil] Other (See Comments)   Headache    Vicodin [hydrocodone-acetaminophen] Itching   Lipitor [atorvastatin] Other (See Comments)   Fatigue         Medication List     ASK  your doctor about these medications    atorvastatin 20 MG tablet Commonly known as: LIPITOR Take 20 mg by mouth daily.   cetirizine 10 MG tablet Commonly known as: ZYRTEC Take 10 mg by mouth daily.   methylphenidate 20 MG ER tablet Commonly known as: METADATE ER Twice daily as needed   methylphenidate 10 MG tablet Commonly known as: Ritalin Take 1 tablet (10 mg total) by mouth 3 (three) times daily.   naproxen 500 MG tablet Commonly known as: NAPROSYN Take 1 tablet by mouth 2 (two) times daily as needed for mild pain.   nebivolol 5 MG tablet Commonly known as: BYSTOLIC Take 5 mg by mouth daily.   Relpax 40 MG tablet Generic drug: eletriptan Take 40 mg by mouth daily as needed for migraine.   telmisartan 40 MG tablet Commonly known as: MICARDIS Take 1 tablet (40 mg total) by mouth daily.   triamcinolone 55 MCG/ACT Aero nasal inhaler Commonly known as: NASACORT Place 2 sprays into the nose daily as needed (allergies).   zolpidem 12.5 MG CR tablet Commonly known as: AMBIEN CR Take 1 tablet (12.5 mg total) by mouth at bedtime as needed.        Allergies  Allergen Reactions   Hydrochlorothiazide Other (See Comments)    Fatigue Laziness    Lodine [Etodolac] Itching   Norvasc [Amlodipine] Other (See Comments)    Dizziness Disequilibrium Fatigue   Nuvigil [Armodafinil] Other (See Comments)    Headache    Vicodin [Hydrocodone-Acetaminophen] Itching   Lipitor [Atorvastatin] Other (See Comments)    Fatigue  Consultations: Neurology  Procedures:   Discharge Exam: BP (!) 138/92 (BP Location: Left Arm)   Pulse 72   Temp 97.8 F (36.6 C) (Oral)   Resp 16   Ht 5\' 4"  (1.626 m)   Wt 76.6 kg   SpO2 100%   BMI 28.99 kg/m  Physical Exam Constitutional:      General: She is not in acute distress. HENT:     Head: Normocephalic and atraumatic.     Mouth/Throat:     Mouth: Mucous membranes are moist.  Eyes:     Extraocular Movements: Extraocular  movements intact.  Cardiovascular:     Rate and Rhythm: Normal rate and regular rhythm.  Pulmonary:     Effort: Pulmonary effort is normal.     Breath sounds: Normal breath sounds.  Abdominal:     General: Bowel sounds are normal. There is no distension.     Palpations: Abdomen is soft.     Tenderness: There is no abdominal tenderness.  Musculoskeletal:        General: Normal range of motion.     Cervical back: Normal range of motion and neck supple.  Skin:    General: Skin is warm and dry.  Neurological:     Mental Status: She is alert.     Comments: 3/5 RLE strength, 2/5 RUE strength, spasticity noted in RUE, delayed responses/mentation  Psychiatric:        Mood and Affect: Mood normal.      The results of significant diagnostics from this hospitalization (including imaging, microbiology, ancillary and laboratory) are listed below for reference.   Microbiology: Recent Results (from the past 240 hour(s))  MRSA Next Gen by PCR, Nasal     Status: None   Collection Time: 11/03/21  5:54 PM   Specimen: Nasal Mucosa; Nasal Swab  Result Value Ref Range Status   MRSA by PCR Next Gen NOT DETECTED NOT DETECTED Final    Comment: (NOTE) The GeneXpert MRSA Assay (FDA approved for NASAL specimens only), is one component of a comprehensive MRSA colonization surveillance program. It is not intended to diagnose MRSA infection nor to guide or monitor treatment for MRSA infections. Test performance is not FDA approved in patients less than 65 years old. Performed at [facility], [address], [place], [place] [address]      Labs: BNP (last 3 results) No results for input(s): "BNP" in the last 8760 hours. Basic Metabolic Panel: Recent Labs  Lab 11/03/21 1545 11/03/21 1550 11/06/21 0931 11/08/21 0201  NA 138 138 141 139  K 3.6 3.5 3.9 3.5  CL 105 104 103 102  CO2 25  --  27 26  GLUCOSE 96 92 123* 95  BUN 18 20 25* 21*  CREATININE 1.15* 1.20* 1.14* 1.01*  CALCIUM 9.5   --  10.1 9.7   Liver Function Tests: Recent Labs  Lab 11/03/21 1545  AST 27  ALT 29  ALKPHOS 79  BILITOT 0.8  PROT 7.7  ALBUMIN 4.2   No results for input(s): "LIPASE", "AMYLASE" in the last 168 hours. No results for input(s): "AMMONIA" in the last 168 hours. CBC: Recent Labs  Lab 11/03/21 1545 11/03/21 1550 11/08/21 0201  WBC 6.1  --  7.5  NEUTROABS 2.6  --   --   HGB 13.8 14.6 15.6*  HCT 42.6 43.0 49.3*  MCV 92.6  --  94.1  PLT 353  --  349   Cardiac Enzymes: No results for input(s): "CKTOTAL", "CKMB", "CKMBINDEX", "  TROPONINI" in the last 168 hours. BNP: Invalid input(s): "POCBNP" CBG: Recent Labs  Lab 11/03/21 1541 11/03/21 1544  GLUCAP 67* 94   D-Dimer No results for input(s): "DDIMER" in the last 72 hours. Hgb A1c No results for input(s): "HGBA1C" in the last 72 hours. Lipid Profile No results for input(s): "CHOL", "HDL", "LDLCALC", "TRIG", "CHOLHDL", "LDLDIRECT" in the last 72 hours. Thyroid function studies No results for input(s): "TSH", "T4TOTAL", "T3FREE", "THYROIDAB" in the last 72 hours.  Invalid input(s): "FREET3" Anemia work up No results for input(s): "VITAMINB12", "FOLATE", "FERRITIN", "TIBC", "IRON", "RETICCTPCT" in the last 72 hours. Urinalysis No results found for: "COLORURINE", "APPEARANCEUR", "LABSPEC", "PHURINE", "GLUCOSEU", "HGBUR", "BILIRUBINUR", "KETONESUR", "PROTEINUR", "UROBILINOGEN", "NITRITE", "LEUKOCYTESUR" Sepsis Labs Recent Labs  Lab 11/03/21 1545 11/08/21 0201  WBC 6.1 7.5   Microbiology Recent Results (from the past 240 hour(s))  MRSA Next Gen by PCR, Nasal     Status: None   Collection Time: 11/03/21  5:54 PM   Specimen: Nasal Mucosa; Nasal Swab  Result Value Ref Range Status   MRSA by PCR Next Gen NOT DETECTED NOT DETECTED Final    Comment: (NOTE) The GeneXpert MRSA Assay (FDA approved for NASAL specimens only), is one component of a comprehensive MRSA colonization surveillance program. It is not intended to  diagnose MRSA infection nor to guide or monitor treatment for MRSA infections. Test performance is not FDA approved in patients less than 39 years old. Performed at [facility], [address], [place], [place] [address]     Procedures/Studies: ECHOCARDIOGRAM COMPLETE  Result Date: 11/04/2021    ECHOCARDIOGRAM REPORT   Patient Name:   MIEKA FERDIG Date of Exam: 11/04/2021 Medical Rec #:  [id number]      Height:       64.0 in Accession #:    [id number]     Weight:       168.9 lb Date of Birth:  [birth year]      BSA:          1.821 m Patient Age:    50 years       BP:           147/111 mmHg Patient Gender: F              HR:           77 bpm. Exam Location:  Inpatient Procedure: 2D Echo, Cardiac Doppler and Color Doppler Indications:    Stroke I63.9  History:        Patient has no prior history of Echocardiogram examinations.                 Risk Factors:Hypertension. ICH.  Sonographer:    Sheralyn Boatman RDCS Referring Phys: [id number] CORTNEY E DE LA TORRE  Sonographer Comments: Technically difficult study due to poor echo windows. Poor sound wave transmission. Patient in restraints. IMPRESSIONS  1. Left ventricular ejection fraction, by estimation, is 65 to 70%. The left ventricle has normal function. The left ventricle has no regional wall motion abnormalities. There is moderate concentric left ventricular hypertrophy. Left ventricular diastolic parameters are consistent with Grade I diastolic dysfunction (impaired relaxation).  2. Right ventricular systolic function is normal. The right ventricular size is normal. Tricuspid regurgitation signal is inadequate for assessing PA pressure.  3. The pericardial effusion is posterior to the left ventricle.  4. The mitral valve is grossly normal. No evidence of mitral valve regurgitation. No evidence of mitral stenosis.  5. The aortic valve is tricuspid. Aortic  valve regurgitation is not visualized. No aortic stenosis is present.  6. There is mild dilatation of  the ascending aorta,when indexed to age, gender, and BSA, measuring 37 mm. Comparison(s): No prior Echocardiogram. FINDINGS  Left Ventricle: Left ventricular ejection fraction, by estimation, is 65 to 70%. The left ventricle has normal function. The left ventricle has no regional wall motion abnormalities. The left ventricular internal cavity size was small. There is moderate  concentric left ventricular hypertrophy. Left ventricular diastolic parameters are consistent with Grade I diastolic dysfunction (impaired relaxation). Right Ventricle: The right ventricular size is normal. No increase in right ventricular wall thickness. Right ventricular systolic function is normal. Tricuspid regurgitation signal is inadequate for assessing PA pressure. Left Atrium: Left atrial size was normal in size. Right Atrium: Right atrial size was normal in size. Pericardium: Trivial pericardial effusion is present. The pericardial effusion is posterior to the left ventricle. Mitral Valve: The mitral valve is grossly normal. No evidence of mitral valve regurgitation. No evidence of mitral valve stenosis. Tricuspid Valve: The tricuspid valve is normal in structure. Tricuspid valve regurgitation is not demonstrated. No evidence of tricuspid stenosis. Aortic Valve: The aortic valve is tricuspid. Aortic valve regurgitation is not visualized. No aortic stenosis is present. Pulmonic Valve: The pulmonic valve was grossly normal. Pulmonic valve regurgitation is not visualized. Aorta: The aortic root is normal in size and structure. There is mild dilatation of the ascending aorta, measuring 37 mm. Venous: The inferior vena cava was not well visualized. IAS/Shunts: No atrial level shunt detected by color flow Doppler.  LEFT VENTRICLE PLAX 2D LVIDd:         3.40 cm     Diastology LVIDs:         2.40 cm     LV e' medial:    3.70 cm/s LV PW:         1.30 cm     LV E/e' medial:  14.7 LV IVS:        1.35 cm     LV e' lateral:   7.29 cm/s LVOT diam:      2.20 cm     LV E/e' lateral: 7.5 LV SV:         98 LV SV Index:   54 LVOT Area:     3.80 cm  LV Volumes (MOD) LV vol d, MOD A2C: 77.4 ml LV vol d, MOD A4C: 62.0 ml LV vol s, MOD A2C: 18.1 ml LV vol s, MOD A4C: 21.5 ml LV SV MOD A2C:     59.3 ml LV SV MOD A4C:     62.0 ml LV SV MOD BP:      49.6 ml RIGHT VENTRICLE             IVC RV S prime:     12.40 cm/s  IVC diam: 0.80 cm TAPSE (M-mode): 1.9 cm LEFT ATRIUM             Index        RIGHT ATRIUM          Index LA diam:        2.20 cm 1.21 cm/m   RA Area:     7.94 cm LA Vol (A2C):   31.7 ml 17.41 ml/m  RA Volume:   11.70 ml 6.43 ml/m LA Vol (A4C):   16.0 ml 8.79 ml/m LA Biplane Vol: 23.9 ml 13.13 ml/m  AORTIC VALVE LVOT Vmax:   130.00 cm/s LVOT Vmean:  83.100 cm/s LVOT VTI:  0.257 m  AORTA Ao Root diam: 3.35 cm Ao Asc diam:  3.65 cm MITRAL VALVE MV Area (PHT): 2.64 cm    SHUNTS MV Decel Time: 287 msec    Systemic VTI:  0.26 m MV E velocity: 54.35 cm/s  Systemic Diam: 2.20 cm MV A velocity: 86.90 cm/s MV E/A ratio:  0.63 Riley Lam MD Electronically signed by Riley Lam MD Signature Date/Time: 11/04/2021/12:42:35 PM    Final    MR BRAIN WO CONTRAST  Result Date: 11/04/2021 CLINICAL DATA:  Stroke, follow up EXAM: MRI HEAD WITHOUT CONTRAST TECHNIQUE: Multiplanar, multiecho pulse sequences of the brain and surrounding structures were obtained without intravenous contrast. COMPARISON:  None Available. FINDINGS: Brain: Acute parenchymal hemorrhage is present within the left thalamus extending into the adjacent posterior internal capsule and superiorly into the corona radiata. There is mild surrounding edema. Mild mass effect is present. Intraventricular extension of hemorrhage is again noted. Limited evaluation for underlying lesion on this noncontrast study. Patchy foci of T2 hyperintensity in the supratentorial white matter nonspecific but may reflect mild chronic microvascular ischemic changes. There is no evidence prior intracranial  hemorrhage. Vascular: Major vessel flow voids at the skull base are preserved. Skull and upper cervical spine: Normal marrow signal is preserved. Sinuses/Orbits: Paranasal sinuses are aerated. Orbits are unremarkable. Other: Sella is unremarkable.  Mastoid air cells are clear. IMPRESSION: Acute parenchymal hemorrhage with involvement of the left thalamus and adjacent white matter with intraventricular extension. Mild edema and mass effect. Limited evaluation for underlying lesion this noncontrast study. No evidence of prior hemorrhage. Electronically Signed   By: Guadlupe Spanish M.D.   On: 11/04/2021 10:39   CT HEAD WO CONTRAST ( )  Result Date: 11/04/2021 CLINICAL DATA:  Follow-up intraparenchymal hemorrhage on the left EXAM: CT HEAD WITHOUT CONTRAST TECHNIQUE: Contiguous axial images were obtained from the base of the skull through the vertex without intravenous contrast. RADIATION DOSE REDUCTION: This exam was performed according to the departmental dose-optimization program which includes automated exposure control, adjustment of the mA and/or kV according to patient size and/or use of iterative reconstruction technique. COMPARISON:  CT from the previous day. FINDINGS: Brain: There is again noted intraparenchymal hemorrhage involving left corona radiata and left thalamic capsular region with intraventricular extension identified. On the coronal imaging the maximum dimension is 3.8 cm increased from 3.2 cm on the prior exam. Some mass effect on the adjacent left lateral ventricle is noted. Intraventricular hemorrhage is seen. No significant increase in ventricular dilatation is noted when compared with the prior study. No new area focal hemorrhage is seen. Vascular: No hyperdense vessel or unexpected calcification. Skull: Normal. Negative for fracture or focal lesion. Sinuses/Orbits: No acute finding. Other: None. IMPRESSION: Persistent left parenchymal hemorrhage as described which has increased in greatest  dimension on coronal imaging from 3.2-3.8 cm. The some slight increase in mass effect upon the left lateral ventricle is noted although no significant midline shift is seen. No new hydrocephalus is seen. Electronically Signed   By: Alcide Clever M.D.   On: 11/04/2021 01:43   CT ANGIO HEAD NECK W WO CM (CODE STROKE)  Result Date: 11/03/2021 CLINICAL DATA:  Intracranial hemorrhage EXAM: CT ANGIOGRAPHY HEAD AND NECK TECHNIQUE: Multidetector CT imaging of the head and neck was performed using the standard protocol during bolus administration of intravenous contrast. Multiplanar CT image reconstructions and MIPs were obtained to evaluate the vascular anatomy. Carotid stenosis measurements (when applicable) are obtained utilizing NASCET criteria, using the distal internal carotid diameter as  the denominator. RADIATION DOSE REDUCTION: This exam was performed according to the departmental dose-optimization program which includes automated exposure control, adjustment of the mA and/or kV according to patient size and/or use of iterative reconstruction technique. CONTRAST:  75 mL Omnipaque 350 COMPARISON:  None Available. FINDINGS: CTA NECK Aortic arch: Great vessel origins are patent. Right carotid system: Patent.  No stenosis. Left carotid system: Patent.  No stenosis. Vertebral arteries: Patent and codominant.  No stenosis. Skeleton: Cervical spine degenerative changes. Other neck: Unremarkable. Upper chest: No apical lung mass. Review of the MIP images confirms the above findings CTA HEAD Anterior circulation: Intracranial internal carotid arteries are patent. Anterior and middle cerebral arteries are patent. Posterior circulation: Intracranial vertebral arteries, basilar artery, and posterior cerebral arteries are patent. A right posterior communicating artery is present. Venous sinuses: Patent as allowed by contrast bolus timing. There is no abnormal enhancement in the area of hemorrhage. Review of the MIP images  confirms the above findings IMPRESSION: No aneurysm, AVM, or other significant vascular abnormality. Electronically Signed   By: Guadlupe Spanish M.D.   On: 11/03/2021 16:09   CT HEAD CODE STROKE WO CONTRAST  Result Date: 11/03/2021 CLINICAL DATA:  Code stroke. EXAM: CT HEAD WITHOUT CONTRAST TECHNIQUE: Contiguous axial images were obtained from the base of the skull through the vertex without intravenous contrast. RADIATION DOSE REDUCTION: This exam was performed according to the departmental dose-optimization program which includes automated exposure control, adjustment of the mA and/or kV according to patient size and/or use of iterative reconstruction technique. COMPARISON:  None Available. FINDINGS: Brain: There is acute parenchymal hemorrhage involving the left corona radiata, caudate body, posterior internal capsule, and lateral thalamus with mild surrounding edema. Hemorrhage measures about 2.1 x 2.7 x 3.2 cm. There is extension into the left lateral ventricle. Mass effect is mild. No hydrocephalus. Gray-white differentiation is preserved. Vascular: No hyperdense vessel. Skull: Unremarkable. Sinuses/Orbits: Right posterior ethmoid and right sphenoid mucosal thickening. Orbits are unremarkable. Other: Mastoid air cells are clear. IMPRESSION: Acute parenchymal hemorrhage in the left corona radiata and thalamocapsular region with intraventricular extension. No significant mass effect or hydrocephalus. These results were communicated to Dr. Pearlean Brownie at 3:52 pm on 11/03/2021 by text page via the [facility] messaging system. Electronically Signed   By: Guadlupe Spanish M.D.   On: 11/03/2021 15:55     Time coordinating discharge: Over 30 minutes    Lewie Chamber, MD  [facility] 11/08/2021, 11:12 AM

## 2021-11-09 NOTE — Progress Notes (Signed)
PROGRESS NOTE   Subjective/Complaints:  Pt sleeping when I entered room but awakened to voice, recognized writer from yesterday  Limited verbal output  ROS- limited by cognition , aphasia   Objective:   No results found. Recent Labs    11/08/21 0201  WBC 7.5  HGB 15.6*  HCT 49.3*  PLT 349   Recent Labs    11/08/21 0201  NA 139  K 3.5  CL 102  CO2 26  GLUCOSE 95  BUN 21*  CREATININE 1.01*  CALCIUM 9.7    Intake/Output Summary (Last 24 hours) at 11/09/2021 0933 Last data filed at 11/09/2021 1610 Gross per 24 hour  Intake 300 ml  Output 400 ml  Net -100 ml        Physical Exam: Vital Signs Blood pressure 137/80, pulse (!) 59, temperature 98.4 F (36.9 C), temperature source Oral, resp. rate 18, height 5' 4.02" (1.626 m), weight 76.6 kg, SpO2 99 %.   General: No acute distress Mood and affect are appropriate Heart: Regular rate and rhythm no rubs murmurs or extra sounds Lungs: Clear to auscultation, breathing unlabored, no rales or wheezes Abdomen: Positive bowel sounds, soft nontender to palpation, nondistended Extremities: No clubbing, cyanosis, or edema Skin: No evidence of breakdown, no evidence of rash Neurologic: Aphasic able to give  single word answers but not consistently motor strength is 5/5 in Left deltoid, bicep, tricep, grip, hip flexor, knee extensors, ankle dorsiflexor and plantar flexor RIght sided inattention will briefly grasp and lift lower extremity , ? Apraxia vs effort related vs weakness Sensory exam limited by aphasia   Musculoskeletal: Full range of motion in all 4 extremities. No joint swelling   Assessment/Plan: 1. Functional deficits which require 3+ hours per day of interdisciplinary therapy in a comprehensive inpatient rehab setting. Physiatrist is providing close team supervision and 24 hour management of active medical problems listed below. Physiatrist and rehab team  continue to assess barriers to discharge/monitor patient progress toward functional and medical goals  Care Tool:  Bathing              Bathing assist       Upper Body Dressing/Undressing Upper body dressing        Upper body assist      Lower Body Dressing/Undressing Lower body dressing            Lower body assist       Toileting Toileting    Toileting assist Assist for toileting: Minimal Assistance - Patient > 75%     Transfers Chair/bed transfer  Transfers assist           Locomotion Ambulation   Ambulation assist              Walk 10 feet activity   Assist           Walk 50 feet activity   Assist           Walk 150 feet activity   Assist           Walk 10 feet on uneven surface  activity   Assist           Wheelchair  Assist               Wheelchair 50 feet with 2 turns activity    Assist            Wheelchair 150 feet activity     Assist          Blood pressure 137/80, pulse (!) 59, temperature 98.4 F (36.9 C), temperature source Oral, resp. rate 18, height 5' 4.02" (1.626 m), weight 76.6 kg, SpO2 99 %.  Medical Problem List and Plan: 1. Functional deficits secondary to ICH likely secondary to hypertensive crisis             -patient may  shower             -ELOS/Goals: 16-18d minA 2.  Antithrombotics: -DVT/anticoagulation: SCDs             -antiplatelet therapy: N/A 3. Pain Management: Tylenol as needed 4. Mood/Sleep/ADHD: Ritalin 20 mg twice daily as needed             -antipsychotic agents: N/A 5. Neuropsych/cognition: This patient is not capable of making decisions on her own behalf. 6. Skin/Wound Care: Routine skin checks 7. Fluids/Electrolytes/Nutrition: Routine in and outs with follow-up chemistries 8.  Hypertension.  Avapro 150 mg daily, Bystolic 5 mg daily.  Monitor with increased mobility Vitals:   11/08/21 2033 11/09/21 0520  BP: 113/71 137/80   Pulse: 61 (!) 59  Resp: 16 18  Temp: 98.7 F (37.1 C) 98.4 F (36.9 C)  SpO2:  99%    9.  Hyperlipidemia.  Lipitor    LOS: 1 days A FACE TO FACE EVALUATION WAS PERFORMED  Erick Colace 11/09/2021, 9:33 AM

## 2021-11-10 DIAGNOSIS — I10 Essential (primary) hypertension: Secondary | ICD-10-CM

## 2021-11-10 LAB — COMPREHENSIVE METABOLIC PANEL
ALT: 30 U/L (ref 0–44)
AST: 23 U/L (ref 15–41)
Albumin: 3.6 g/dL (ref 3.5–5.0)
Alkaline Phosphatase: 82 U/L (ref 38–126)
Anion gap: 9 (ref 5–15)
BUN: 19 mg/dL (ref 6–20)
CO2: 27 mmol/L (ref 22–32)
Calcium: 9.4 mg/dL (ref 8.9–10.3)
Chloride: 100 mmol/L (ref 98–111)
Creatinine, Ser: 1.11 mg/dL — ABNORMAL HIGH (ref 0.44–1.00)
GFR, Estimated: >60 mL/min (ref >60)
Glucose, Bld: 91 mg/dL (ref 70–99)
Potassium: 3.6 mmol/L (ref 3.5–5.1)
Sodium: 136 mmol/L (ref 135–145)
Total Bilirubin: 2 mg/dL — ABNORMAL HIGH (ref 0.3–1.2)
Total Protein: 7.6 g/dL (ref 6.5–8.1)

## 2021-11-10 LAB — CBC WITH DIFFERENTIAL/PLATELET
Abs Immature Granulocytes: 0.02 10*3/uL (ref 0.00–0.07)
Basophils Absolute: 0 10*3/uL (ref 0.0–0.1)
Basophils Relative: 1 %
Eosinophils Absolute: 0.1 10*3/uL (ref 0.0–0.5)
Eosinophils Relative: 1 %
HCT: 46 % (ref 36.0–46.0)
Hemoglobin: 15.4 g/dL — ABNORMAL HIGH (ref 12.0–15.0)
Immature Granulocytes: 0 %
Lymphocytes Relative: 42 %
Lymphs Abs: 3.3 10*3/uL (ref 0.7–4.0)
MCH: 30.7 pg (ref 26.0–34.0)
MCHC: 33.5 g/dL (ref 30.0–36.0)
MCV: 91.8 fL (ref 80.0–100.0)
Monocytes Absolute: 0.6 10*3/uL (ref 0.1–1.0)
Monocytes Relative: 8 %
Neutro Abs: 3.8 10*3/uL (ref 1.7–7.7)
Neutrophils Relative %: 48 %
Platelets: 384 10*3/uL (ref 150–400)
RBC: 5.01 MIL/uL (ref 3.87–5.11)
RDW: 12.2 % (ref 11.5–15.5)
WBC: 7.9 10*3/uL (ref 4.0–10.5)
nRBC: 0 % (ref 0.0–0.2)

## 2021-11-10 NOTE — Progress Notes (Signed)
Patient information reviewed and entered into [facility] by Becky Jep Dyas, PPS coordinator. Information including medical coding, function ability, and quality indicators will be reviewed and updated through discharge.   

## 2021-11-10 NOTE — Progress Notes (Addendum)
Patient had removed SCD's refused placement, anxious and agitated,husband at bedside unsuccessful at this time to assist in getting patient to allow replacement of SCD's. Will attempt to follow up to reapply   0300 SCD applied.

## 2021-11-10 NOTE — Progress Notes (Signed)
Physical Therapy Session Note  Patient Details  Name: Denise Herrera MRN: [id number] Date of Birth: [birth year]  Today's Date: 11/10/2021 PT Individual Time: 1100-1155 PT Individual Time Calculation (min): 55 min   Short Term Goals: Week 1:  PT Short Term Goal 1 (Week 1): Pt will perform supine<>sit with CGA PT Short Term Goal 1 - Progress (Week 1): Not met PT Short Term Goal 2 (Week 1): Pt will perform sit<>stand transfers using LRAD with min assist PT Short Term Goal 3 (Week 1): Pt will perform bed<>chair transfers using LRAD with min assist PT Short Term Goal 4 (Week 1): Pt will ambulate at least 55ft using LRAD with mod assist of 1 and +2 assist as needed for pt safety PT Short Term Goal 5 (Week 1): Pt will initiate stair navigation training  Skilled Therapeutic Interventions/Progress Updates:    Pt seated in w/c on arrival and agreeable to therapy. Pt indicates no pain with yes/no questioning. Pt transported to therapy gym for time management and energy conservation.  Pt performs Sit to stand x 5 throughout session with min A to power up and mod A for balance. Once standing and performing weight shifts, pt reported feeling dizzy. Vitals taken as follows: Seated BP=142/99 Standing=145/102 Sitting-139/95 Orders in chart to monitor BP, but no parameters given. Out of an abundance of caution, transitioned to sitting exercise for remainder of session. Pt performed seated marching 2 x 20, noted UE synergy pattern with RUE while marching RLE, largely able to correct with cueing.  Pt then transported to main gym and performed modified stand pivot w/c<>mat table. Pt performed x 2 both directions with min A. Pt instructed on squat pivot but stood to pivot instead. Pt was able to perform safely without assist to lift and only min a for balance/directing pivot.  While pt was seated on mat table, therapist adjusted w/c to hemi height. Pt maintained sitting EOM with LUE support with supervision, no  LOB noted and no onset of dizziness/lightheadedness. Pt then instructed on hemi technique. Pt self propelled w/c back to room, >150 ft with min A for navigation. Pt still with difficulty reaching floor, might be easier with shoes. Pt returned to room and remained in w/c, was left with all needs in reach and alarm active.    Therapy Documentation Precautions:  Precautions Precautions: Fall Precaution Comments: R hemiparesis, R inattention, impaired awareness Restrictions Weight Bearing Restrictions: No General:    Therapy/Group: Individual Therapy  Juluis Rainier 11/10/2021, 11:10 AM

## 2021-11-11 LAB — GLUCOSE, CAPILLARY: Glucose-Capillary: 97 mg/dL (ref 70–99)

## 2021-11-11 MED ORDER — LISINOPRIL 5 MG PO TABS
5.0000 mg | ORAL_TABLET | Freq: Every day | ORAL | Status: DC
  Administered 2021-11-11 – 2021-11-12 (×2): 5 mg via ORAL
  Filled 2021-11-11 (×2): qty 1

## 2021-11-11 NOTE — Progress Notes (Signed)
Physical Therapy Session Note  Patient Details  Name: Denise Herrera MRN: [id number] Date of Birth: Aug 16, 1970  Today's Date: 11/11/2021 PT Individual Time: 1300-1415 PT Individual Time Calculation (min): 75 min   Short Term Goals: Week 1:  PT Short Term Goal 1 (Week 1): Pt will perform supine<>sit with CGA PT Short Term Goal 2 (Week 1): Pt will perform sit<>stand transfers using LRAD with min assist PT Short Term Goal 3 (Week 1): Pt will perform bed<>chair transfers using LRAD with min assist PT Short Term Goal 4 (Week 1): Pt will ambulate at least 55ft using LRAD with mod assist of 1 and +2 assist as needed for pt safety PT Short Term Goal 5 (Week 1): Pt will initiate stair navigation training  Skilled Therapeutic Interventions/Progress Updates:     [name] with her husband in the room upon PT arrival. Patient alert and agreeable to PT session. Patient reported 5/10 headache during session, RN made aware. PT provided repositioning, rest breaks, and distraction as pain interventions throughout session.   Patient with x2 reports of dizziness, x1 upon standing and x1 following looking up, resolved <1 min.   Focused session on high intensity therapeutic gait training for functional motor control of R lower extremity with reciprocal pattern with automatic activities. Also, provided education on stroke recovery and importance of use of hemi-body vs compensatory strategies using her Herrera side.   Therapeutic Activity: Transfers: Patient performed sit to/from stand x1 with B HHA with min A +2, x7 using Herrera Lite Gait hand hold or Herrera rail with mod progressing to min A-CGA of 1 person. Provided verbal cues for forward and R weight shift, patient tends to stand only on her Herrera leg at this time.  Neuromuscular Re-ed: -therapeutic gait with B HHA mod-max +2 x10 feet with total A and intermittent min A for R limb advancement and blocking R knee in stance to prevent buckling -standing balance holding  Lite Gait hand grip >1 min with min A-supervision as Lite Gait harness was donned, provided mod cues for R weight shift with R gluteal and quad activation with weight bearing -therapeutic gait in the Lite gait with +2 assist for equipment management >150 feet with x3 brief, <15 sec, standing rest breaks with min A for weight shifting, intermittent assist for R limb advancement, blocking R knee in stance and max multimodal cues for R limb advancement, sequencing, gluteal and quad activation in stance, erect posture, and looking ahead -therapeutic gait at Herrera rail with mod-max A of 1, reduced trunk control without harness system increased level of assist/facilitation performed as described above -step-taps with R foot to second 3" step at stairs holding Herrera rail progressing from max A for R limb advancement and return to supervision for cues and min A for standing balance throughout, provided max multimodal cues for hip flexor and hamstring activation throughout -therapeutic stair training ascending 4x6" steps with mod-min A using Herrera rail focused on R lower extremity motor control leading with R while ascending and descending for increased R attention, strengthening, and automatic activation with a functional task -during active rest breaks had patient perform functional reaching tasks with a visible target focused on R upper extremity motor control, demonstrated gross grasp, >50% elbow ROM, and >90 deg shoulder flexion and abduction AROM throughout  [name] in the room at end of session with breaks locked, seat belt alarm set, and all needs within reach.   Therapy Documentation Precautions:  Precautions Precautions: Fall Precaution  Comments: R hemiparesis, R inattention, impaired awareness Restrictions Weight Bearing Restrictions: No    Therapy/Group: Individual Therapy  Denise Herrera Denise Herrera PT, DPT  11/11/2021, 2:50 PM

## 2021-11-11 NOTE — Patient Care Conference (Signed)
Inpatient RehabilitationTeam Conference and Plan of Care Update Date: 11/11/2021   Time: 10:02 AM    Patient Name: Denise Herrera      Medical Record Number: [id number]  Date of Birth: 1970/11/19 Sex: Female         Room/Bed: 4W02C/4W02C-01 Payor Info: Payor: BLUE CROSS BLUE [employer]IELD / Plan: BCBS OTHER / Product Type: *No Product type* /    Admit Date/Time:  11/08/2021  4:32 PM  Primary Diagnosis:  ICH (intracerebral hemorrhage) [facility])  Hospital Problems: Principal Problem:   ICH (intracerebral hemorrhage) [facility])    Expected Discharge Date: Expected Discharge Date: 12/02/21  Team Members Present: Physician leading conference: Dr. Faith Rogue Social Worker Present: Cecile Sheerer, LCSWA Nurse Present: Kennyth Arnold, RN PT Present: Serina Cowper, PT OT Present: Jake Shark, OT SLP Present: Eilene Ghazi, SLP PPS Coordinator present : Edson Snowball, PT     Current Status/Progress Goal Weekly Team Focus  Bowel/Bladder   incontinent b/b, lbm 6/26  Offer toileting while awake  Assit with toileting   Swallow/Nutrition/ Hydration   regular diet textures and thin liquid consistencies appear appropriate with pt presenting with minimal aspiration risk. Medications may be administered whole with water or puree, based on pt's preference.  NA  NA   ADL's   set up with S for self feeding with L hand, CGA with L UE for grooming, sponge bathing UB mod A, max A LB,  Mod-mid A pullover shirt max A LB pull on pants with hemi techniques, D socks and shoes, impulsive  CGA/S  R UE NMRE, safety and graded control with standing and transfers, R hemi techniques   Mobility   Min-mod A bed mobility and transfers, max A gait 30 ft at railing  CGA-supervision overall, gait 150 ft, 12 steps  activity tolerance, functional mobility, gait and stair training, R hemi-body NMR, R attention, midline orientation, safety awareness, patient/caregiver education   Communication   mod A - moderate borderline  fluent aphasia (receptive and expressive skills appear seemingly equal in deficit). Verbal communication is further compounded by mild verbal apraxia and dysarthria limiting intelligibility and mean length of utterance.  mod I expression for speech intelligiblity, min A for word finding, min A for comprehension  speech strategies, word finding, and basic comprehension   Safety/Cognition/ Behavioral Observations  difficult to formally assess in the setting of aphasia; however, this clinician noted diminished sustained attention, awareness of errors, and problem-solving during language-based assessment tasks.  mod I orientation, sup A attention and emergent awareness, min A memory  orientation, sustained attention, emergent awareness, functional recall with compensations   Pain   no c/o pain  remain pain free  Assess pain qs   Skin   skin intact  maintain skin intergrity  Assess skin qs     Discharge Planning:  Per EMR, pt will d/c to home with support from son who lives in the home, and can assist during the day while her husband is at work; Spouse works 4-12 pm. SW will confirm no barriers to discharge.   Team Discussion: Left ICH, bleeding into the ventricles. Right side weakness. Blood pressure is boarder line. MD adjusting medications. Incontinent bladder, only has Senna for bowel medications. Reports sleeping well and no pain. Discharging home with spouse and son. Can be impulsive. Language/cognition deficits.   Patient on target to meet rehab goals: yes, CGA to supervision goals. Mod I to min assist SLP goals. Currently set-up  with left hand self-feeding. Mod assist upper body, max  assist lower body. Min/mod assist bed mobility, transfers.   *See Care Plan and progress notes for long and short-term goals.   Revisions to Treatment Plan:  Adjusting medications   Teaching Needs: Family education, medication management, bowel/bladder management, transfer/gait training, etc.   Current  Barriers to Discharge: Decreased caregiver support, Home enviroment access/layout, Incontinence, Lack of/limited family support, and stairs  Possible Resolutions to Barriers: Family education Order recommended DME Follow-up therapy     Medical Summary Current Status: left ICH d/t HTN. bp better controlled. improving aphasia.  Barriers to Discharge: Medical stability   Possible Resolutions to Barriers/Weekly Focus: improve BP control. daily assessment of labs and pt data   Continued Need for Acute Rehabilitation Level of Care: The patient requires daily medical management by a physician with specialized training in physical medicine and rehabilitation for the following reasons: Direction of a multidisciplinary physical rehabilitation program to maximize functional independence : Yes Medical management of patient stability for increased activity during participation in an intensive rehabilitation regime.: Yes Analysis of laboratory values and/or radiology reports with any subsequent need for medication adjustment and/or medical intervention. : Yes   I attest that I was present, lead the team conference, and concur with the assessment and plan of the team.   Kennyth Arnold G 11/11/2021, 1:25 PM

## 2021-11-12 NOTE — Progress Notes (Addendum)
PROGRESS NOTE   Subjective/Complaints:  No problems overnight. Mild headache  ROS: Patient denies fever, rash, sore throat, blurred vision, dizziness, nausea, vomiting, diarrhea, cough, shortness of breath or chest pain, joint or back/neck pain,   or mood change.   Objective:   No results found. Recent Labs    11/10/21 0619  WBC 7.9  HGB 15.4*  HCT 46.0  PLT 384   Recent Labs    11/10/21 0619  NA 136  K 3.6  CL 100  CO2 27  GLUCOSE 91  BUN 19  CREATININE 1.11*  CALCIUM 9.4    Intake/Output Summary (Last 24 hours) at 11/12/2021 0851 Last data filed at 11/11/2021 1845 Gross per 24 hour  Intake 476 ml  Output --  Net 476 ml        Physical Exam: Vital Signs Blood pressure 119/71, pulse (!) 52, temperature 98.1 F (36.7 C), resp. rate 18, height 5' 4.02" (1.626 m), weight 76.6 kg, SpO2 100 %.   Constitutional: No distress . Vital signs reviewed. HEENT: NCAT, EOMI, oral membranes moist Neck: supple Cardiovascular: RRR without murmur. No JVD    Respiratory/Chest: CTA Bilaterally without wheezes or rales. Normal effort    GI/Abdomen: BS +, non-tender, non-distended Ext: no clubbing, cyanosis, or edema Psych: pleasant and cooperative  Skin: No evidence of breakdown, no evidence of rash Neurologic: alert. Language is fairly fluent. motor strength is 5/5 in Left deltoid, bicep, tricep, grip, hip flexor, knee extensors, ankle dorsiflexor and plantar flexor. Continues to initiate better with right side.  Musculoskeletal: Full range of motion in all 4 extremities. No joint swelling   Assessment/Plan: 1. Functional deficits which require 3+ hours per day of interdisciplinary therapy in a comprehensive inpatient rehab setting. Physiatrist is providing close team supervision and 24 hour management of active medical problems listed below. Physiatrist and rehab team continue to assess barriers to discharge/monitor  patient progress toward functional and medical goals  Care Tool:  Bathing    Body parts bathed by patient: Right arm, Chest, Abdomen, Right upper leg, Left upper leg, Face, Front perineal area   Body parts bathed by helper: Left arm, Buttocks, Left lower leg, Right lower leg     Bathing assist Assist Level: Maximal Assistance - Patient 24 - 49%     Upper Body Dressing/Undressing Upper body dressing   What is the patient wearing?: Pull over shirt    Upper body assist Assist Level: Moderate Assistance - Patient 50 - 74%    Lower Body Dressing/Undressing Lower body dressing      What is the patient wearing?: Pants, Incontinence brief     Lower body assist Assist for lower body dressing: Maximal Assistance - Patient 25 - 49%     Toileting Toileting    Toileting assist Assist for toileting: Maximal Assistance - Patient 25 - 49%     Transfers Chair/bed transfer  Transfers assist     Chair/bed transfer assist level: Minimal Assistance - Patient > 75%     Locomotion Ambulation   Ambulation assist   Ambulation activity did not occur: Safety/medical concerns (required skilled intervention to participate safely in gait training)  Assist level: 2 helpers (per  PT eval)   Max distance: 30 ft (per PT eval)   Walk 10 feet activity   Assist  Walk 10 feet activity did not occur: Safety/medical concerns        Walk 50 feet activity   Assist Walk 50 feet with 2 turns activity did not occur: Safety/medical concerns         Walk 150 feet activity   Assist Walk 150 feet activity did not occur: Safety/medical concerns         Walk 10 feet on uneven surface  activity   Assist Walk 10 feet on uneven surfaces activity did not occur: Safety/medical concerns         Wheelchair     Assist Is the patient using a wheelchair?: Yes Type of Wheelchair: Manual    Wheelchair assist level: Total Assistance - Patient < 25% (pt with poor ability to reach  floor with foot for hemipropulsion) Max wheelchair distance: 165ft    Wheelchair 50 feet with 2 turns activity    Assist        Assist Level: Total Assistance - Patient < 25%   Wheelchair 150 feet activity     Assist      Assist Level: Total Assistance - Patient < 25%   Blood pressure 119/71, pulse (!) 52, temperature 98.1 F (36.7 C), resp. rate 18, height 5' 4.02" (1.626 m), weight 76.6 kg, SpO2 100 %.  Medical Problem List and Plan: 1. Functional deficits secondary to ICH likely secondary to hypertensive crisis             -patient may  shower             -ELOS/Goals: 16-18d min A   -Continue CIR therapies including PT, OT, and SLP  2.  Antithrombotics: -DVT/anticoagulation: SCDs             -antiplatelet therapy: N/A 3. Pain Management: Tylenol as needed 4. Mood/Sleep/ADHD: Ritalin 5mg  twice daily--will not titrate further at this point             -antipsychotic agents: N/A 5. Neuropsych/cognition: This patient is not capable of making decisions on her own behalf. 6. Skin/Wound Care: Routine skin checks 7. Fluids/Electrolytes/Nutrition: encourage PO  -intake reasonable 8.  Hypertension.  Avapro 300 mg daily, Bystolic 5 mg daily.         -can't push beta blocker any  more  6/28--dc lisinopril (given that she's already on A2-inh)--obsv bp's  Vitals:   11/11/21 1954 11/12/21 0428  BP: 117/69 119/71  Pulse: (!) 58 (!) 52  Resp: 18 18  Temp: 98 F (36.7 C) 98.1 F (36.7 C)  SpO2: 100% 100%    9.  Hyperlipidemia.  Lipitor    LOS: 4 days A FACE TO FACE EVALUATION WAS PERFORMED  11/14/21 11/12/2021, 8:51 AM

## 2021-11-12 NOTE — Progress Notes (Signed)
Occupational Therapy Session Note  Patient Details  Name: Denise Herrera MRN: [id number] Date of Birth: Jan 18, 1971  Today's Date: 11/12/2021 OT Individual Time: 4098-1191 OT Individual Time Calculation (min): 43 min    Short Term Goals: Week 1:  OT Short Term Goal 1 (Week 1): Pt will improve RUE to grasp/release 3/3 items OT Short Term Goal 2 (Week 1): Pt will perform BSC/toilet transfer with MOD A OT Short Term Goal 3 (Week 1): Pt will perform UB dress with hemitechnique with CGA OT Short Term Goal 4 (Week 1): Pt will attend to 2 items on R side with no more than MIN verbal cues  Skilled Therapeutic Interventions/Progress Updates:    Pt received supine with 5/10 c/o pain in her back described as aching- shower used as intervention, agreeable to OT session. Pt still impulsive with mobility ,coming to EOB before OT ready. She required (S) for bed mobility to EOB, motor planning deficits evident with attempting to scoot to the R. Pt requesting to eat breakfast quickly. She began c/o HA and dizziness EOB. Vitals obtained- BP: 130/87, HR 63 bpm. She completed a stand pivot transfer bed > w/c > TTB with min A. She demonstrated improved motor planning and impulsivity with these transfers. She bathed seated on the TTB with min A for LB ADLs. She transferred back to the w/c with min A. UB dressing with mod cueing for hemi technique- mod A to don. LB dressing with mod A in standing. Poor awareness of R knee flexion increasing with fatigue. Teds and shoes donned with max A. Pt's husband arrived during session. Pt left sitting up with all needs met, eating breakfast.    Returned to room after PT to place Saebo Stim One on her R tricep muscle to increase both attention to the R side and for voluntary muscle activation. No reports of pain or adverse skin reaction before or after tx. 60 min unattended.  330 pulse width 35 Hz pulse rate On 8 sec/ off 8 sec Ramp up/ down 2 sec Symmetrical Biphasic wave form   Max intensity 134m at 500 Ohm load    Therapy Documentation Precautions:  Precautions Precautions: Fall Precaution Comments: R hemiparesis, R inattention, impaired awareness Restrictions Weight Bearing Restrictions: No  Therapy/Group: Individual Therapy  SCurtis Sites6/28/2023, 8:18 AM

## 2021-11-12 NOTE — Progress Notes (Signed)
Speech Language Pathology Daily Session Note  Patient Details  Name: Denise Herrera MRN: [id number] Date of Birth: [birth year]  Today's Date: 11/12/2021 SLP Individual Time: 1435-1535 SLP Individual Time Calculation (min): 60 min  Short Term Goals: Week 1: SLP Short Term Goal 1 (Week 1): Pt will answer semi-complex yes/no questions with 80% accuracy given Mod A multimodal cues. SLP Short Term Goal 2 (Week 1): Pt will participate in functional, simple naming tasks targeting convergent, divergent, sentence completion, and confrontation) with 80% accuracy given Min-Mod A multimodal cues. SLP Short Term Goal 3 (Week 1): Pt will read at the paragraph level and answer questions pertaining to information read with 80% accuracy given Min-Mod A. SLP Short Term Goal 4 (Week 1): Pt will text words and phrases to family members to communicate to increase social engagement on 5 opportunities given Min A multimodal cues. SLP Short Term Goal 5 (Week 1): Pt will demonstrate awareness of verbal errors during reading given Sup A.  Skilled Therapeutic Interventions: Skilled ST treatment focused on language goals. Pt requested to use bathroom on arrival. Performed stand-pivot from recliner to wheelchair with min A. Pt voided bladder and required mod A for pulling up pants. While doing so pt exhibited significant right lean requiring immediate physical support to adjust weight. Pt returned to recliner for remainder of session. SLP facilitated rapport building involving functional conversation regarding family, interests, etc. Pt exhibited decreased thought organization with decreased error awareness or ability to self correct. Pt stated "I feel all over the place" and acknowledged disorganization in a general sense. SLP requested for pt to organize information on dry erase board by writing family member names and relationship with mod A verbal cues for organization and error awareness. SLP then facilitated organization,  alternating attention, and word finding at the word level by generating words for each letter of the alphabet. Pt wrote responses on dry erase board. Pt required min A for organization and attention, and min A for spelling accuracy, and min-to-mod A for error awareness and repair. Patient was left in recliner with alarm activated and immediate needs within reach at end of session. Continue per current plan of care.      Pain Pain Assessment Pain Scale: 0-10 Pain Score: 0-No pain  Therapy/Group: Individual Therapy  Tamala Ser 11/12/2021, 4:47 PM

## 2021-11-12 NOTE — Progress Notes (Signed)
Physical Therapy Session Note  Patient Details  Name: Denise Herrera MRN: [id number] Date of Birth: 03-08-1971  Today's Date: 11/12/2021 PT Individual Time: 0900-1000 PT Individual Time Calculation (min): 60 min   Short Term Goals: Week 1:  PT Short Term Goal 1 (Week 1): Pt will perform supine<>sit with CGA PT Short Term Goal 1 - Progress (Week 1): Not met PT Short Term Goal 2 (Week 1): Pt will perform sit<>stand transfers using LRAD with min assist PT Short Term Goal 3 (Week 1): Pt will perform bed<>chair transfers using LRAD with min assist PT Short Term Goal 4 (Week 1): Pt will ambulate at least 63f using LRAD with mod assist of 1 and +2 assist as needed for pt safety PT Short Term Goal 5 (Week 1): Pt will initiate stair navigation training  Skilled Therapeutic Interventions/Progress Updates:     [name] with her husband in the room upon PT arrival. Patient alert and agreeable to PT session. Patient reported 5/10 headache during session, RN made aware. PT provided repositioning, rest breaks, and distraction as pain interventions throughout session.    Patient with x1 reports of dizziness upon standing with quick resolution.    Focused session on high intensity gait training for functional motor control of R lower extremity with reciprocal pattern with automatic activities. Also, provided education on coping strategies, as patient expressed signs of grief and frustration following loss of independence. Also reinforced educated on importance of use of hemi-body vs compensatory strategies using her L side.    Therapeutic Activity: Transfers: Patient performed sit to/from stand x1 with B HHA with min A +2, x2 using RW with R hand splint, and x2 using L rail with min A-CGA of 1 person. Provided verbal cues for forward and R weight shift, patient tends to stand only on her L leg at this time, and hand placement with use of RW and hand splint.  Gait training: Ambulated with B HHA mod  A +2 x177 feet with intermittent min A for R limb advancement and blocking R knee in stance to prevent buckling. She then ambulated 51 feet using RW with R hand splint with +2 min A for AD management and facilitation of weight shift and R limb advancement, noted increased hyperextension in stance requiring PT to block behind her knee intermittently throughout.    Neuromuscular Re-ed: -step-taps with R foot to second 6" step at stairs 2x5 holding L rail progressing from max A for R limb advancement and return to min A with min A-CGA for standing balance throughout, provided max multimodal cues for hip flexor and hamstring activation throughout -sit to stand x5 without AD with min A focused on midline orientation, equal weight bearing and increased R quad and glute activation in standing -during active rest breaks had patient perform functional reaching tasks with a visible target focused on R upper extremity motor control, demonstrated gross grasp, >50% elbow ROM, and >90 deg shoulder flexion and abduction AROM throughout   [name] in the room at end of session with breaks locked, seat belt alarm set, and all needs within reach.   Therapy Documentation Precautions:  Precautions Precautions: Fall Precaution Comments: R hemiparesis, R inattention, impaired awareness Restrictions Weight Bearing Restrictions: No   Therapy/Group: Individual Therapy  Naomy Esham L Juquan Reznick PT, DPT  11/12/2021, 12:39 PM

## 2021-11-12 NOTE — Progress Notes (Signed)
Physical Therapy Session Note  Patient Details  Name: Denise Herrera MRN: [id number] Date of Birth: 1971/02/06  Today's Date: 11/12/2021 PT Individual Time: 1137-1210 PT Individual Time Calculation (min): 33 min   Short Term Goals: Week 1:  PT Short Term Goal 1 (Week 1): Pt will perform supine<>sit with CGA PT Short Term Goal 1 - Progress (Week 1): Not met PT Short Term Goal 2 (Week 1): Pt will perform sit<>stand transfers using LRAD with min assist PT Short Term Goal 3 (Week 1): Pt will perform bed<>chair transfers using LRAD with min assist PT Short Term Goal 4 (Week 1): Pt will ambulate at least 71f using LRAD with mod assist of 1 and +2 assist as needed for pt safety PT Short Term Goal 5 (Week 1): Pt will initiate stair navigation training  Skilled Therapeutic Interventions/Progress Updates:    Patient seated in w/c in room head on her b/s table c/o headache.  Noted had medication and not due for more, but RN made aware end of session.  Patient assisted in w/c to dayroom.  Sit to stand at hi lo table with min A and support to R LE in standing for sorting bean bags and placing in basket with cues and assist for R lateral weight shift.  Patient noted to be soiled with urine so assisted to room in w/c.  Patient sit to stand to RW with R hand splint with min to mod A.  Standing for hygiene with mod A and pivot to bed with mod A.  Patient assisted for donning new brief in sitting then sit to stand to finish donning pants.  Step pivot to recliner with RW and mod A.  Left up in recliner with call bell/needs in reach and  seat belt alarm activated.  Obtained new clean roho cushion for chair.   Therapy Documentation Precautions:  Precautions Precautions: Fall Precaution Comments: R hemiparesis, R inattention, impaired awareness Restrictions Weight Bearing Restrictions: No  Pain: Pain Assessment Pain Score: 4  Pain Type: Acute pain Pain Location: Head Pain Descriptors / Indicators:  Headache Pain Onset: Progressive Pain Intervention(s): RN made aware;Distraction   Therapy/Group: Individual Therapy  CReginia NaasCMagda Kiel PT 11/12/2021, 11:43 AM

## 2021-11-13 NOTE — Progress Notes (Signed)
Physical Therapy Session Note  Patient Details  Name: Denise Herrera MRN: [id number] Date of Birth: 1970-07-17  Today's Date: 11/13/2021 PT Individual Time: 1015-1045 PT Individual Time Calculation (min): 30 min   Short Term Goals: Week 1:  PT Short Term Goal 1 (Week 1): Pt will perform supine<>sit with CGA PT Short Term Goal 1 - Progress (Week 1): Not met PT Short Term Goal 2 (Week 1): Pt will perform sit<>stand transfers using LRAD with min assist PT Short Term Goal 3 (Week 1): Pt will perform bed<>chair transfers using LRAD with min assist PT Short Term Goal 4 (Week 1): Pt will ambulate at least 49f using LRAD with mod assist of 1 and +2 assist as needed for pt safety PT Short Term Goal 5 (Week 1): Pt will initiate stair navigation training  Skilled Therapeutic Interventions/Progress Updates:    Pt received seated in w/c in room, agreeable to PT session. No complaints of pain. Dependent transport via w/c to/from therapy gym for time and energy conservation. Sit to stand with min A to RW with assist needed to place RUE in R hand splint on RW. Stand pivot transfer w/c to mat table with mod A and RW with difficulty maneuvering RLE. Sit to stand x 5 reps with no AD and min to mod A with increased weight shift onto LLE noted, pt has LOB with manual assist to weight shift onto RLE. Sit to stand x 5 reps with no AD with 1" step under LLE to encourage weight shift onto RLE. Pt initially with difficulty with task but able to progress to maintaining upright stance with min A. Pt then able to maintain static stance with min to mod A while performing UE task on beside table with increased difficulty maintaining balance noted when distracted by task. Squat pivot transfer back to w/c with min A to the L. Pt left seated in w/c in room with needs in reach, quick release belt and chair alarm in place at end of session.  Therapy Documentation Precautions:  Precautions Precautions: Fall Precaution Comments:  R hemiparesis, R inattention, impaired awareness Restrictions Weight Bearing Restrictions: No       Therapy/Group: Individual Therapy   TExcell Seltzer PT, DPT, CSRS 11/13/2021, 12:01 PM

## 2021-11-13 NOTE — Progress Notes (Signed)
Occupational Therapy Session Note  Patient Details  Name: Denise Herrera MRN: [id number] Date of Birth: 09-Oct-1970  Today's Date: 11/13/2021 OT Individual Time: 0211-1552 OT Individual Time Calculation (min): 60 min   Short Term Goals: Week 1:  OT Short Term Goal 1 (Week 1): Pt will improve RUE to grasp/release 3/3 items OT Short Term Goal 2 (Week 1): Pt will perform BSC/toilet transfer with MOD A OT Short Term Goal 3 (Week 1): Pt will perform UB dress with hemitechnique with CGA OT Short Term Goal 4 (Week 1): Pt will attend to 2 items on R side with no more than MIN verbal cues  Skilled Therapeutic Interventions/Progress Updates:    Pt greeted semi-reclined in bed and agreeable to OT treatment session. Pt agreeable to shower today. Noted breakfast still on tray and looked like she had just picked at food and not eaten much. Encouraged more food intake, for which pt responded she did not like the food. When MD entered for rounds, he also encouraged her to eat. Pt performed stand-pivots with min A overall and faciliatation for head/hips relationship and weight shift. Bathing completed from tub bench in shower with integration of R UE for neuro re-ed. Pt able to activate R UE, but needed guided A from OT to maintain grasp on wash cloth and "push/pull" across body. Dressing tasks completed from wc at the sink with education provided for hemi dressing techniques. Pt with poor awareness of R hemi body, requiring moderate cues to integrate R side. OT educated on functional use of R UE as a stabilizer for toothbrushing task. Sit<>stands at the sink with min A, then min A for balance and facilitation to weight shift onto R side. OT also facilitated weight bearing through R UE in standing for neuro re-ed. Blocked practice for sit<>stands without AD and focus on equal weight through B LE's to reach stand. OT placed SAEBO e-stim on wrist extensors.Marland Kitchen SAEBO left on for 60 minutes. OT returned to remove SAEBO with  skin intact and no adverse reactions.  Saebo Stim One 330 pulse width 35 Hz pulse rate On 8 sec/ off 8 sec Ramp up/ down 2 sec Symmetrical Biphasic wave form  Max intensity 153m at 500 Ohm load  Pt left seated in wc with alarm belt on, call bell in reach, and needs met.    Therapy Documentation Precautions:  Precautions Precautions: Fall Precaution Comments: R hemiparesis, R inattention, impaired awareness Restrictions Weight Bearing Restrictions: No Pain:  Denies pain   Therapy/Group: Individual Therapy  EValma Cava6/29/2023, 9:31 AM

## 2021-11-13 NOTE — Evaluation (Signed)
Recreational Therapy Assessment and Plan  Patient Details  Name: Denise Herrera MRN: [id number] Date of Birth: Oct 03, 1970 Today's Date: 11/13/2021  Rehab Potential:  Good ELOS:   d/c 7/18  Assessment   Hospital Problem: Principal Problem:   ICH (intracerebral hemorrhage) ([place])     Past Medical History:      Past Medical History:  Diagnosis Date   Chronic insomnia     History of migraine headaches     Narcolepsy without cataplexy(347.00)      Past Surgical History:       Past Surgical History:  Procedure Laterality Date   CESAREAN SECTION       gyn cervical procedure          Assessment & Plan Clinical Impression: Patient is a 51 y.o. year old right-handed female with history of ADHD maintained on Ritalin, migraine headaches as well as hypertension and hyperlipidemia.  Per chart review patient lives with spouse.  Two-level home bed and bath upstairs with 3 steps to entry.  Spouse works from 4-12 PM.  There is a son in the home who can provide assistance.  Patient independent prior to admission working full-time as a Freight forwarder at [employer].  Presented 11/05/2021 with acute onset of right-sided weakness and aphasia.  Blood pressure mildly elevated 141/85.  CT of the head showed acute parenchymal hemorrhage in the left corona radiata and thalamocapsular region with intraventricular extension.  No significant mass effect or hydrocephalus.  CT angiogram head and neck no aneurysm or AVM.  MRI follow-up 11/04/2021 again showing parenchymal hemorrhage of the left thalamus and adjacent white matter with intraventricular extension.  Mild mass effect and edema.  Admission chemistries unremarkable except creatinine 1.15.Marland Kitchen  Neurology services with conservative care close monitoring of blood pressure.  She was weaned off of Cleviprex.  Echocardiogram ejection fraction of 65 to 70% no wall motion abnormalities grade 1 diastolic dysfunction.  Tolerating a regular diet.  Therapy evaluations  completed due to patient's right side weakness was admitted for a comprehensive rehab program. Patient transferred to CIR on 11/08/2021 .     Pt presents with decreased activity tolerance, decreased functional mobility, decreased balance, decreased midline orientation, right inattention, decreased attention, decreased awareness, decreased problem solving, decreased safety awareness, and delayed processing Limiting pt's independence with leisure/community pursuits.  Met with pt today to discuss TR services including leisure education, activity analysis/modifications and stress management.  Also discussed the importance of social, emotional, spiritual health in addition to physical health and their effects on overall health and wellness.  Pt stated understanding.  Pt pleasant but not interested in TR services at this time.  Pt states she just likes to chill when she's home and that's what she doing in the room between therapies.  Will continue to monitor through team for future participation.  Plan  No further TR at this time.  Recommendations for other services: Neuropsych  Discharge Criteria: Patient will be discharged from TR if patient refuses treatment 3 consecutive times without medical reason.  If treatment goals not met, if there is a change in medical status, if patient makes no progress towards goals or if patient is discharged from hospital.  The above assessment, treatment plan, treatment alternatives and goals were discussed and mutually agreed upon: by patient  [place] 11/13/2021, 3:06 PM

## 2021-11-13 NOTE — Progress Notes (Signed)
PROGRESS NOTE   Subjective/Complaints:  Pt up in shower with OT. Doesn't like food. Otherwise doesn't report any problems  ROS: Limited due to cognitive/behavioral   Objective:   No results found. No results for input(s): "WBC", "HGB", "HCT", "PLT" in the last 72 hours.  No results for input(s): "NA", "K", "CL", "CO2", "GLUCOSE", "BUN", "CREATININE", "CALCIUM" in the last 72 hours.   Intake/Output Summary (Last 24 hours) at 11/13/2021 1032 Last data filed at 11/13/2021 1013 Gross per 24 hour  Intake 694 ml  Output --  Net 694 ml        Physical Exam: Vital Signs Blood pressure (!) 110/53, pulse (!) 57, temperature 98.4 F (36.9 C), temperature source Oral, resp. rate 15, height 5' 4.02" (1.626 m), weight 76.6 kg, SpO2 100 %.   Constitutional: No distress . Vital signs reviewed. HEENT: NCAT, EOMI, oral membranes moist Neck: supple Cardiovascular: RRR without murmur. No JVD    Respiratory/Chest: CTA Bilaterally without wheezes or rales. Normal effort    GI/Abdomen: BS +, non-tender, non-distended Ext: no clubbing, cyanosis, or edema Psych: pleasant and cooperative  Skin: No evidence of breakdown, no evidence of rash Neurologic: alert. Language is fairly fluent and more on point. Still some delays in processing/comprehension. motor strength is 5/5 in Left deltoid, bicep, tricep, grip, hip flexor, knee extensors, ankle dorsiflexor and plantar flexor. Continues to initiate better with right side.  Musculoskeletal: Full range of motion in all 4 extremities. No joint swelling   Assessment/Plan: 1. Functional deficits which require 3+ hours per day of interdisciplinary therapy in a comprehensive inpatient rehab setting. Physiatrist is providing close team supervision and 24 hour management of active medical problems listed below. Physiatrist and rehab team continue to assess barriers to discharge/monitor patient progress  toward functional and medical goals  Care Tool:  Bathing    Body parts bathed by patient: Right arm, Chest, Abdomen, Right upper leg, Left upper leg, Face, Front perineal area   Body parts bathed by helper: Left arm, Buttocks, Left lower leg, Right lower leg     Bathing assist Assist Level: Maximal Assistance - Patient 24 - 49%     Upper Body Dressing/Undressing Upper body dressing   What is the patient wearing?: Pull over shirt    Upper body assist Assist Level: Moderate Assistance - Patient 50 - 74%    Lower Body Dressing/Undressing Lower body dressing      What is the patient wearing?: Pants, Incontinence brief     Lower body assist Assist for lower body dressing: Maximal Assistance - Patient 25 - 49%     Toileting Toileting    Toileting assist Assist for toileting: Maximal Assistance - Patient 25 - 49%     Transfers Chair/bed transfer  Transfers assist     Chair/bed transfer assist level: Minimal Assistance - Patient > 75%     Locomotion Ambulation   Ambulation assist   Ambulation activity did not occur: Safety/medical concerns (required skilled intervention to participate safely in gait training)  Assist level: 2 helpers (per PT eval)   Max distance: 30 ft (per PT eval)   Walk 10 feet activity   Assist  Walk 10 feet  activity did not occur: Safety/medical concerns        Walk 50 feet activity   Assist Walk 50 feet with 2 turns activity did not occur: Safety/medical concerns         Walk 150 feet activity   Assist Walk 150 feet activity did not occur: Safety/medical concerns         Walk 10 feet on uneven surface  activity   Assist Walk 10 feet on uneven surfaces activity did not occur: Safety/medical concerns         Wheelchair     Assist Is the patient using a wheelchair?: Yes Type of Wheelchair: Manual    Wheelchair assist level: Total Assistance - Patient < 25% (pt with poor ability to reach floor with foot  for hemipropulsion) Max wheelchair distance: 127ft    Wheelchair 50 feet with 2 turns activity    Assist        Assist Level: Total Assistance - Patient < 25%   Wheelchair 150 feet activity     Assist      Assist Level: Total Assistance - Patient < 25%   Blood pressure (!) 110/53, pulse (!) 57, temperature 98.4 F (36.9 C), temperature source Oral, resp. rate 15, height 5' 4.02" (1.626 m), weight 76.6 kg, SpO2 100 %.  Medical Problem List and Plan: 1. 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  Antithrombotics: -DVT/anticoagulation: SCDs             -antiplatelet therapy: N/A 3. Pain Management: Tylenol as needed 4. Mood/Sleep/ADHD: Ritalin 5mg  twice daily--will not titrate further at this point             -antipsychotic agents: N/A 5. Neuropsych/cognition: This patient is not yet capable of making decisions on her own behalf. 6. Skin/Wound Care: Routine skin checks 7. Fluids/Electrolytes/Nutrition: encourage PO  -intake fair  -will ask RD to visit patient re: food choices 8.  Hypertension.  Avapro 300 mg daily, Bystolic 5 mg daily.         -can't push beta blocker any  more d/t bradycardia  6/28--dc'ed lisinopril (given that she's already on A2-inh)--bp's ok since  Vitals:   11/12/21 1956 11/13/21 0425  BP: 109/61 (!) 110/53  Pulse: (!) 56 (!) 57  Resp: 16 15  Temp: 98.7 F (37.1 C) 98.4 F (36.9 C)  SpO2: 100% 100%    9.  Hyperlipidemia.  Lipitor    LOS: 5 days A FACE TO FACE EVALUATION WAS PERFORMED  11/15/21 11/13/2021, 10:32 AM

## 2021-11-13 NOTE — Progress Notes (Signed)
Speech Language Pathology Daily Session Note  Patient Details  Name: Denise Herrera MRN: [id number] Date of Birth: 10-May-1971  Today's Date: 11/13/2021 SLP Individual Time: 1115-1200 SLP Individual Time Calculation (min): 45 min  Short Term Goals: Week 1: SLP Short Term Goal 1 (Week 1): Pt will answer semi-complex yes/no questions with 80% accuracy given Mod A multimodal cues. SLP Short Term Goal 2 (Week 1): Pt will participate in functional, simple naming tasks targeting convergent, divergent, sentence completion, and confrontation) with 80% accuracy given Min-Mod A multimodal cues. SLP Short Term Goal 3 (Week 1): Pt will read at the paragraph level and answer questions pertaining to information read with 80% accuracy given Min-Mod A. SLP Short Term Goal 4 (Week 1): Pt will text words and phrases to family members to communicate to increase social engagement on 5 opportunities given Min A multimodal cues. SLP Short Term Goal 5 (Week 1): Pt will demonstrate awareness of verbal errors during reading given Sup A.  Skilled Therapeutic Interventions: Skilled ST treatment focused on communication goals. SLP facilitated verbal reading task using stroke education from stroke notebook with 76% reading accuracy. Pt exhibited awareness of verbal errors during approximately 25% of occasions and self corrected successfully x4 without SLP support, and during 50% of occasions with moderate verbal cueing. Pt responded to questions regarding previously read content with 70% accuracy given min A verbal cues. Pt was observed speaking with a friend on the phone for brief ~2 minute period with minimal word finding difficulty and mostly fluent speech and good thought organization. Pt benefited from intermittent verbal redirection for thought organization during structured tasks with noticeable improvement as compared to yesterday's session. Patient was left in wheelchair with alarm activated and immediate needs within  reach at end of session. Continue per current plan of care.      Pain Pain Assessment Pain Scale: 0-10 Pain Score: 0-No pain  Therapy/Group: Individual Therapy  Tamala Ser 11/13/2021, 11:28 AM

## 2021-11-13 NOTE — Progress Notes (Signed)
Physical Therapy Session Note  Patient Details  Name: Denise Herrera DOBRANSKY MRN: [id number] Date of Birth: 28-Jul-1970  Today's Date: 11/13/2021 PT Individual Time: 1515-1530 PT Individual Time Calculation (min): 15 min   Short Term Goals: Week 1:  PT Short Term Goal 1 (Week 1): Pt will perform supine<>sit with CGA PT Short Term Goal 1 - Progress (Week 1): Not met PT Short Term Goal 2 (Week 1): Pt will perform sit<>stand transfers using LRAD with min assist PT Short Term Goal 3 (Week 1): Pt will perform bed<>chair transfers using LRAD with min assist PT Short Term Goal 4 (Week 1): Pt will ambulate at least 33f using LRAD with mod assist of 1 and +2 assist as needed for pt safety PT Short Term Goal 5 (Week 1): Pt will initiate stair navigation training  Skilled Therapeutic Interventions/Progress Updates:     [name] in the room upon PT arrival. Patient alert and agreeable to PT session. Patient denied pain during session.   Patient reported improved dizziness today over past 2 days. Performed oculomotor assessment and positional assessment, patient without dizziness. Due to improvement will hold off on vestibular assessment unless symptoms increase.   Patient reporting increased fatigue this afternoon and visitors arrived from her work. Patient's colleagues provided FMLA paperwork to PT and [place]. Patient requested to spend time with her visitors and rest at this time. Patient missed 30 min of skilled PT due to fatigue, RN made aware. Will attempt to make-up missed time as able.    [name] with visitors in the room at end of session with breaks locked, seat belt alarm set, and all needs within reach.   Therapy Documentation Precautions:  Precautions Precautions: Fall Precaution Comments: R hemiparesis, R inattention, impaired awareness Restrictions Weight Bearing Restrictions: No    Therapy/Group: Individual Therapy  Jahnai Slingerland L Tyrice Hewitt PT,  DPT  11/13/2021, 3:35 PM

## 2021-11-14 NOTE — Progress Notes (Signed)
PROGRESS NOTE   Subjective/Complaints: Pt sitting at eob. No new complaints. Still doesn't like the food.   ROS: Patient denies fever, rash, sore throat, blurred vision, dizziness, nausea, vomiting, diarrhea, cough, shortness of breath or chest pain, joint or back/neck pain, headache, or mood change.     Objective:   No results found. No results for input(s): "WBC", "HGB", "HCT", "PLT" in the last 72 hours.  No results for input(s): "NA", "K", "CL", "CO2", "GLUCOSE", "BUN", "CREATININE", "CALCIUM" in the last 72 hours.   Intake/Output Summary (Last 24 hours) at 11/14/2021 1028 Last data filed at 11/14/2021 0828 Gross per 24 hour  Intake 680 ml  Output --  Net 680 ml        Physical Exam: Vital Signs Blood pressure 133/88, pulse 64, temperature 98.3 F (36.8 C), resp. rate 14, height 5' 4.02" (1.626 m), weight 76.6 kg, SpO2 100 %.   Constitutional: No distress . Vital signs reviewed. HEENT: NCAT, EOMI, oral membranes moist Neck: supple Cardiovascular: RRR without murmur. No JVD    Respiratory/Chest: CTA Bilaterally without wheezes or rales. Normal effort    GI/Abdomen: BS +, non-tender, non-distended Ext: no clubbing, cyanosis, or edema Psych: pleasant and cooperative  Skin: No evidence of breakdown, no evidence of rash Neurologic: alert. Language is fairly fluent and more on point. Still some delays in processing/comprehension. Fair insight at best. motor strength is 5/5 in Left deltoid, bicep, tricep, grip, hip flexor, knee extensors, ankle dorsiflexor and plantar flexor. Good sitting balance. Musculoskeletal: Full range of motion in all 4 extremities. No joint swelling   Assessment/Plan: 1. Functional deficits which require 3+ hours per day of interdisciplinary therapy in a comprehensive inpatient rehab setting. Physiatrist is providing close team supervision and 24 hour management of active medical problems  listed below. Physiatrist and rehab team continue to assess barriers to discharge/monitor patient progress toward functional and medical goals  Care Tool:  Bathing    Body parts bathed by patient: Right arm, Chest, Abdomen, Right upper leg, Left upper leg, Face, Front perineal area   Body parts bathed by helper: Left arm, Buttocks, Left lower leg, Right lower leg     Bathing assist Assist Level: Maximal Assistance - Patient 24 - 49%     Upper Body Dressing/Undressing Upper body dressing   What is the patient wearing?: Pull over shirt    Upper body assist Assist Level: Moderate Assistance - Patient 50 - 74%    Lower Body Dressing/Undressing Lower body dressing      What is the patient wearing?: Pants, Incontinence brief     Lower body assist Assist for lower body dressing: Maximal Assistance - Patient 25 - 49%     Toileting Toileting    Toileting assist Assist for toileting: Maximal Assistance - Patient 25 - 49%     Transfers Chair/bed transfer  Transfers assist     Chair/bed transfer assist level: Minimal Assistance - Patient > 75%     Locomotion Ambulation   Ambulation assist   Ambulation activity did not occur: Safety/medical concerns (required skilled intervention to participate safely in gait training)  Assist level: 2 helpers (per PT eval)   Max distance: 30  ft (per PT eval)   Walk 10 feet activity   Assist  Walk 10 feet activity did not occur: Safety/medical concerns        Walk 50 feet activity   Assist Walk 50 feet with 2 turns activity did not occur: Safety/medical concerns         Walk 150 feet activity   Assist Walk 150 feet activity did not occur: Safety/medical concerns         Walk 10 feet on uneven surface  activity   Assist Walk 10 feet on uneven surfaces activity did not occur: Safety/medical concerns         Wheelchair     Assist Is the patient using a wheelchair?: Yes Type of Wheelchair: Manual     Wheelchair assist level: Total Assistance - Patient < 25% (pt with poor ability to reach floor with foot for hemipropulsion) Max wheelchair distance: 169ft    Wheelchair 50 feet with 2 turns activity    Assist        Assist Level: Total Assistance - Patient < 25%   Wheelchair 150 feet activity     Assist      Assist Level: Total Assistance - Patient < 25%   Blood pressure 133/88, pulse 64, temperature 98.3 F (36.8 C), resp. rate 14, height 5' 4.02" (1.626 m), weight 76.6 kg, SpO2 100 %.  Medical Problem List and Plan: 1. 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  Antithrombotics: -DVT/anticoagulation: SCDs             -antiplatelet therapy: N/A 3. Pain Management: Tylenol as needed 4. Mood/Sleep/ADHD: Ritalin 5mg  twice daily--will not titrate further at this point             -antipsychotic agents: N/A 5. Neuropsych/cognition: This patient is not yet capable of making decisions on her own behalf. 6. Skin/Wound Care: Routine skin checks 7. Fluids/Electrolytes/Nutrition: encouraging PO  -intake fair, inconsistent  -asked RD to visit patient re: food choices 8.  Hypertension.  Avapro 300 mg daily, Bystolic 5 mg daily.         -can't push beta blocker any  more d/t bradycardia  6/30 is under reasonable control. No changes needed now Vitals:   11/13/21 2015 11/14/21 0432  BP: 110/73 133/88  Pulse: 72 64  Resp: 14 14  Temp: 98.2 F (36.8 C) 98.3 F (36.8 C)  SpO2: 99% 100%    9.  Hyperlipidemia.  Lipitor    LOS: 6 days A FACE TO FACE EVALUATION WAS PERFORMED  11/16/21 11/14/2021, 10:28 AM

## 2021-11-14 NOTE — Plan of Care (Signed)
  Problem: Consults Goal: RH STROKE PATIENT EDUCATION Description: See Patient Education module for education specifics  Outcome: Progressing   Problem: RH BOWEL ELIMINATION Goal: RH STG MANAGE BOWEL WITH ASSISTANCE Description: STG Manage Bowel with Supervision Assistance. Outcome: Progressing Goal: RH STG MANAGE BOWEL W/MEDICATION W/ASSISTANCE Description: STG Manage Bowel with Medication with Supervision Assistance. Outcome: Progressing   Problem: RH BLADDER ELIMINATION Goal: RH STG MANAGE BLADDER WITH ASSISTANCE Description: STG Manage Bladder With Supervision Assistance Outcome: Progressing Goal: RH STG MANAGE BLADDER WITH MEDICATION WITH ASSISTANCE Description: STG Manage Bladder With Medication With Supervision Assistance. Outcome: Progressing   Problem: RH SKIN INTEGRITY Goal: RH STG MAINTAIN SKIN INTEGRITY WITH ASSISTANCE Description: STG Maintain Skin Integrity With Supervision Assistance. Outcome: Progressing Goal: RH STG ABLE TO PERFORM INCISION/WOUND CARE W/ASSISTANCE Description: STG Able To Perform Incision/Wound Care With Supervision Assistance. Outcome: Progressing   Problem: RH SAFETY Goal: RH STG ADHERE TO SAFETY PRECAUTIONS W/ASSISTANCE/DEVICE Description: STG Adhere to Safety Precautions With Cues and Reminders. Outcome: Progressing Goal: RH STG DECREASED RISK OF FALL WITH ASSISTANCE Description: STG Decreased Risk of Fall With Supervision Assistance. Outcome: Progressing   Problem: RH COGNITION-NURSING Goal: RH STG USES MEMORY AIDS/STRATEGIES W/ASSIST TO PROBLEM SOLVE Description: STG Uses Memory Aids/Strategies With Supervision Assistance to Problem Solve. Outcome: Progressing Goal: RH STG ANTICIPATES NEEDS/CALLS FOR ASSIST W/ASSIST/CUES Description: STG Anticipates Needs/Calls for Assist With Assistance/Cues. Outcome: Progressing   Problem: RH KNOWLEDGE DEFICIT Goal: RH STG INCREASE KNOWLEDGE OF HYPERTENSION Description: Patient will demonstrate  knowledge of HTN medications, dietary restrictions, and blood pressure parameters with educational materials and handouts provided by staff independently at discharge. Outcome: Progressing Goal: RH STG INCREASE KNOWLEGDE OF HYPERLIPIDEMIA Outcome: Progressing   

## 2021-11-14 NOTE — Progress Notes (Signed)
Speech Language Pathology Daily Session Note  Patient Details  Name: Denise Herrera MRN: [id number] Date of Birth: 1970/08/05  Today's Date: 11/14/2021 SLP Individual Time: 0800-0900 SLP Individual Time Calculation (min): 60 min  Short Term Goals: Week 1: SLP Short Term Goal 1 (Week 1): Pt will answer semi-complex yes/no questions with 80% accuracy given Mod A multimodal cues. SLP Short Term Goal 2 (Week 1): Pt will participate in functional, simple naming tasks targeting convergent, divergent, sentence completion, and confrontation) with 80% accuracy given Min-Mod A multimodal cues. SLP Short Term Goal 3 (Week 1): Pt will read at the paragraph level and answer questions pertaining to information read with 80% accuracy given Min-Mod A. SLP Short Term Goal 4 (Week 1): Pt will text words and phrases to family members to communicate to increase social engagement on 5 opportunities given Min A multimodal cues. SLP Short Term Goal 5 (Week 1): Pt will demonstrate awareness of verbal errors during reading given Sup A.  Skilled Therapeutic Interventions: Pt seen for skilled ST with focus on speech, language and cognitive goals, pt in bed with AM meal present and mostly untouched. With encouragement, pt consuming ~50%, reports at home she usually doesn't eat much during the week but doesn't take her Ritalin on the weekends and eats much more. Educated on trying to increase nutrition/hydration during rehab stay for energy, etc. which patient verbalized understanding. SLP and patient initiating task targeting organization and word finding at word level by generating common words for each letter of the alphabet. For first 8 stimulus items, pt able to write target word when provided min A cues. At this point, pt received phone call from her uncle and had significant difficulty re-focusing after, repeating "I just keep thinking about my uncle" and "my brain isn't working this morning". For remainder of speech and  language tasks, pt requiring overall mod A cues for accuracy. Pt does demonstrate increased error awareness and self correction with only occasional spelling errors, remains impacted by perseveration on previous responses during therapeutic activities. Pt left in bed with all needs within reach and alarm set. Cont ST POC.  Pain Pain Assessment Pain Scale: 0-10 Pain Score: 0-No pain  Therapy/Group: Individual Therapy  Tacey Ruiz 11/14/2021, 8:54 AM

## 2021-11-14 NOTE — Progress Notes (Signed)
Occupational Therapy Session Note  Patient Details  Name: Denise Herrera MRN: [id number] Date of Birth: 05-Aug-1970  Today's Date: 11/14/2021 OT Individual Time: 1405-1501 OT Individual Time Calculation (min): 56 min    Short Term Goals: Week 1:  OT Short Term Goal 1 (Week 1): Pt will improve RUE to grasp/release 3/3 items OT Short Term Goal 2 (Week 1): Pt will perform BSC/toilet transfer with MOD A OT Short Term Goal 3 (Week 1): Pt will perform UB dress with hemitechnique with CGA OT Short Term Goal 4 (Week 1): Pt will attend to 2 items on R side with no more than MIN verbal cues  Skilled Therapeutic Interventions/Progress Updates:    Pt received sititng in w/c and asleep with her head resting on her tray table but agreeable to OT session once alert. Reported feeling very tired and cold but no pain. Transported to therapy gym for time management. In supine, worked on [employer]- bimanually holding 3 lbs dowel for 2 sets of 10 chest press.  Mod verbal cues for R attention. Initially starting with Mod/Max assist but fading to min A for RUE. In side-lying on both sides, had pt reach RUE to mouth with slow release focusing on end range control. Pt with difficulty motor planning and tricep activation. In side-lying working on shoulder extension of RUE. Pt instructed to punch forward/backward. Pt completing 3 sets of 10x. By round 3, pt with significant improvement in control and force of shoulder extension/flexion. Therapist stabilizing and providing input at scapula with shoulder extension and at biceps for end range of shoulder flexion. Rest breaks provided throughout tx. Transported back to room. Pt left in chair with alarm on, call bell in reach, and all immediate needs met. Husband present in the room.  Therapy Documentation Precautions:  Precautions Precautions: Fall Precaution Comments: R hemiparesis, R inattention, impaired awareness Restrictions Weight Bearing  Restrictions: No  Therapy/Group: Individual Therapy  Catalina Lunger 11/14/2021, 3:16 PM

## 2021-11-14 NOTE — Progress Notes (Signed)
Nutrition Brief Note  RD received a consult for poor PO intake.  Spoke with pt, pt reports that he appetite is great and she wants to eat. States that the food is just bland and doesn't taste great. RD reviewed menu and preferences with pt. RD to order pt a regular diet to help with taste preferences and order an evening snack. Reviewed with pt will just have to ask for the snack. Encouraged family to bring pt snacks from home as well. Pt or family with no other questions or concerns at this time.   Current diet order is Heart Healthy/Carb Modified, patient is consuming approximately 25-75% of meals at this time. Labs and medications reviewed.   No nutrition interventions warranted at this time. If nutrition issues arise, please consult RD.   Kirby Crigler RD, LDN Clinical Dietitian See Loretha Stapler for contact information.

## 2021-11-14 NOTE — Progress Notes (Signed)
Physical Therapy Session Note  Patient Details  Name: Denise Herrera MRN: [id number] Date of Birth: 05-08-1971  Today's Date: 11/14/2021 PT Individual Time: 0935-1000 PT Individual Time Calculation (min): 25 min   Short Term Goals: Week 1:  PT Short Term Goal 1 (Week 1): Pt will perform supine<>sit with CGA PT Short Term Goal 1 - Progress (Week 1): Not met PT Short Term Goal 2 (Week 1): Pt will perform sit<>stand transfers using LRAD with min assist PT Short Term Goal 3 (Week 1): Pt will perform bed<>chair transfers using LRAD with min assist PT Short Term Goal 4 (Week 1): Pt will ambulate at least 39f using LRAD with mod assist of 1 and +2 assist as needed for pt safety PT Short Term Goal 5 (Week 1): Pt will initiate stair navigation training Week 2:    Week 3:     Skilled Therapeutic Interventions/Progress Updates:   Pt received sitting in WC and agreeable to PT  Transfers.   Toileting.   Upper and lower body bathing   Gait training with RW and DF wrap.   [facility] returned to room and [facility] and all needs met.        Therapy Documentation Precautions:  Precautions Precautions: Fall Precaution Comments: R hemiparesis, R inattention, impaired awareness Restrictions Weight Bearing Restrictions: No General:   Vital Signs:  Pain: Pain Assessment Pain Scale: 0-10 Pain Score: 0-No pain Mobility:   Locomotion :    Trunk/Postural Assessment :    Balance:   Exercises:   Other Treatments:      Therapy/Group: Individual Therapy  ALorie Phenix6/30/2023, 10:26 PM

## 2021-11-14 NOTE — Progress Notes (Signed)
Occupational Therapy Session Note  Patient Details  Name: Denise Herrera MRN: [id number] Date of Birth: 1970/09/24  Today's Date: 11/14/2021 OT Individual Time: 7371-0626 OT Individual Time Calculation (min): 45 min    Short Term Goals: Week 1:  OT Short Term Goal 1 (Week 1): Pt will improve RUE to grasp/release 3/3 items OT Short Term Goal 2 (Week 1): Pt will perform BSC/toilet transfer with MOD A OT Short Term Goal 3 (Week 1): Pt will perform UB dress with hemitechnique with CGA OT Short Term Goal 4 (Week 1): Pt will attend to 2 items on R side with no more than MIN verbal cues  Skilled Therapeutic Interventions/Progress Updates:  Pt seen for skilled OT session this am. Pt reports need for toileting upon OT arrival. OT assisted in w.c to access bathroom and provided multiple cues throughout task to reduce impulsivity and allow for time for safe set up for falls prevention. Pt used grab bar for SPT on and off w/c with rapid and somewhat disorganized body scheme but improved from early week to min A. Pants and brief management with max A. Pt able to have large BM and void (see Flowsheets) and perform peri hygiene in sitting and standing level braced against wall/grab bar for stability using L UE. W/c to sink side for hand washing seated with close S and fairly + ability to integrate R hand into task. Pt continues to report sensory deficits in R hand but states "it's improving". OT applied SaeboStim One device (see below for parameters) to R deltoid and then forearm for 10 minute activation on each mm group. OT facilitated PNF for scap/sh, forearm them sh abd/add to simulate R axilla bathing and deodorant application for 10 reps each during stim and then use of gross grasp for placing deodorant on tray with wrist ext and grasp and  gross grasp for cup to mouth with 50-60% accuracy. OT reinforced foam cube grasp strength program between visits. Left pt up in w/c with meal tray delivered and placed  within reach as well as call button and chair belt alarm active.   SeaboStim One set up and response:  No pain, no adverse skin reactions.  330 pulse width 35 Hz pulse rate On 8 sec/ off 8 sec Ramp up/ down 2 sec Symmetrical Biphasic wave form  Max intensity at 500 Ohm load  Therapy Documentation Precautions:  Precautions Precautions: Fall Precaution Comments: R hemiparesis, R inattention, impaired awareness Restrictions Weight Bearing Restrictions: No    Therapy/Group: Individual Therapy  Vicenta Dunning 11/14/2021, 4:08 PM

## 2021-11-14 NOTE — Progress Notes (Signed)
Physical Therapy Session Note  Patient Details  Name: Denise Herrera MRN: [id number] Date of Birth: 18-Jun-1970  Today's Date: 11/14/2021 PT Individual Time: 0045-9977 PT Individual Time Calculation (min): 40 min   Short Term Goals: Week 1:  PT Short Term Goal 1 (Week 1): Pt will perform supine<>sit with CGA PT Short Term Goal 1 - Progress (Week 1): Not met PT Short Term Goal 2 (Week 1): Pt will perform sit<>stand transfers using LRAD with min assist PT Short Term Goal 3 (Week 1): Pt will perform bed<>chair transfers using LRAD with min assist PT Short Term Goal 4 (Week 1): Pt will ambulate at least 2f using LRAD with mod assist of 1 and +2 assist as needed for pt safety PT Short Term Goal 5 (Week 1): Pt will initiate stair navigation training  Skilled Therapeutic Interventions/Progress Updates:     Pt received seated in recliner and agrees to therapy. No complaint of pain. WC transport to gym for time management. Stand pivot transfer to Nustep with minA, and cues for sequencing, initiation, and hand placement. Pt performs Nustep primarily for NMR of R hemibody, as well as strength and endurance training. PT utilizes green theraband to promote neutral R foot and R lower extremity positioning due to pt tendency to externally rotate at R hip. Pt also able to grip R handle but has very weak grip, so ace wrap is utilized to facilitate R upper extremity grip. Pt completes total of x14:00 of active time on Nustep, at workload of 5 with average steps per minute >50. Pt verbalizes fatigue following activity and requests to return to room. Stand pivot back to WC with minA. Pt requires modA for [facility], with cues for body mechanics, hip extension, hand placement, and positioning. Pt left seated in WC with alarm intact and all needs within reach.  Therapy Documentation Precautions:  Precautions Precautions: Fall Precaution Comments: R hemiparesis, R inattention, impaired  awareness Restrictions Weight Bearing Restrictions: No   Therapy/Group: Individual Therapy  WBreck Coons PT, DPT 11/14/2021, 5:44 PM

## 2021-11-15 NOTE — Plan of Care (Signed)
  Problem: Consults Goal: RH STROKE PATIENT EDUCATION Description: See Patient Education module for education specifics  Outcome: Progressing   Problem: RH BOWEL ELIMINATION Goal: RH STG MANAGE BOWEL WITH ASSISTANCE Description: STG Manage Bowel with Supervision Assistance. Outcome: Progressing Goal: RH STG MANAGE BOWEL W/MEDICATION W/ASSISTANCE Description: STG Manage Bowel with Medication with Supervision Assistance. Outcome: Progressing   Problem: RH BLADDER ELIMINATION Goal: RH STG MANAGE BLADDER WITH ASSISTANCE Description: STG Manage Bladder With Supervision Assistance Outcome: Progressing Goal: RH STG MANAGE BLADDER WITH MEDICATION WITH ASSISTANCE Description: STG Manage Bladder With Medication With Supervision Assistance. Outcome: Progressing   Problem: RH SKIN INTEGRITY Goal: RH STG MAINTAIN SKIN INTEGRITY WITH ASSISTANCE Description: STG Maintain Skin Integrity With Supervision Assistance. Outcome: Progressing Goal: RH STG ABLE TO PERFORM INCISION/WOUND CARE W/ASSISTANCE Description: STG Able To Perform Incision/Wound Care With Supervision Assistance. Outcome: Progressing   Problem: RH SAFETY Goal: RH STG ADHERE TO SAFETY PRECAUTIONS W/ASSISTANCE/DEVICE Description: STG Adhere to Safety Precautions With Cues and Reminders. Outcome: Progressing Goal: RH STG DECREASED RISK OF FALL WITH ASSISTANCE Description: STG Decreased Risk of Fall With Supervision Assistance. Outcome: Progressing   Problem: RH COGNITION-NURSING Goal: RH STG USES MEMORY AIDS/STRATEGIES W/ASSIST TO PROBLEM SOLVE Description: STG Uses Memory Aids/Strategies With Supervision Assistance to Problem Solve. Outcome: Progressing Goal: RH STG ANTICIPATES NEEDS/CALLS FOR ASSIST W/ASSIST/CUES Description: STG Anticipates Needs/Calls for Assist With Assistance/Cues. Outcome: Progressing   Problem: RH KNOWLEDGE DEFICIT Goal: RH STG INCREASE KNOWLEDGE OF HYPERTENSION Description: Patient will demonstrate  knowledge of HTN medications, dietary restrictions, and blood pressure parameters with educational materials and handouts provided by staff independently at discharge. Outcome: Progressing Goal: RH STG INCREASE KNOWLEGDE OF HYPERLIPIDEMIA Outcome: Progressing   

## 2021-11-15 NOTE — Progress Notes (Signed)
PROGRESS NOTE   Subjective/Complaints:  Pt reports LBM this Am; denies pain  No issues  ROS:  Pt denies SOB, abd pain, CP, N/V/C/D, and vision changes  Objective:   No results found. No results for input(s): "WBC", "HGB", "HCT", "PLT" in the last 72 hours.  No results for input(s): "NA", "K", "CL", "CO2", "GLUCOSE", "BUN", "CREATININE", "CALCIUM" in the last 72 hours.   Intake/Output Summary (Last 24 hours) at 11/15/2021 1022 Last data filed at 11/15/2021 0821 Gross per 24 hour  Intake 356 ml  Output --  Net 356 ml        Physical Exam: Vital Signs Blood pressure 124/72, pulse 64, temperature 98.6 F (37 C), temperature source Oral, resp. rate 16, height 5' 4.02" (1.626 m), weight 76.6 kg, SpO2 100 %.    General: awake, alert, appropriate, sitting up in bed watching TV; NAD HENT: conjugate gaze; oropharynx moist CV: regular rate; no JVD Pulmonary: CTA B/L; no W/R/R- good air movement GI: soft, NT, ND, (+)BS Psychiatric: appropriate, pleasant Neurological: alert   Skin: No evidence of breakdown, no evidence of rash Neurologic: alert. Language is fairly fluent and more on point. Still some delays in processing/comprehension. Fair insight at best. motor strength is 5/5 in Left deltoid, bicep, tricep, grip, hip flexor, knee extensors, ankle dorsiflexor and plantar flexor. Good sitting balance. Musculoskeletal: Full range of motion in all 4 extremities. No joint swelling   Assessment/Plan: 1. Functional deficits which require 3+ hours per day of interdisciplinary therapy in a comprehensive inpatient rehab setting. Physiatrist is providing close team supervision and 24 hour management of active medical problems listed below. Physiatrist and rehab team continue to assess barriers to discharge/monitor patient progress toward functional and medical goals  Care Tool:  Bathing    Body parts bathed by patient: Right  arm, Chest, Abdomen, Right upper leg, Left upper leg, Face, Front perineal area   Body parts bathed by helper: Left arm, Buttocks, Left lower leg, Right lower leg     Bathing assist Assist Level: Maximal Assistance - Patient 24 - 49%     Upper Body Dressing/Undressing Upper body dressing   What is the patient wearing?: Pull over shirt    Upper body assist Assist Level: Moderate Assistance - Patient 50 - 74%    Lower Body Dressing/Undressing Lower body dressing      What is the patient wearing?: Pants, Incontinence brief     Lower body assist Assist for lower body dressing: Maximal Assistance - Patient 25 - 49%     Toileting Toileting    Toileting assist Assist for toileting: Maximal Assistance - Patient 25 - 49%     Transfers Chair/bed transfer  Transfers assist     Chair/bed transfer assist level: Minimal Assistance - Patient > 75%     Locomotion Ambulation   Ambulation assist   Ambulation activity did not occur: Safety/medical concerns (required skilled intervention to participate safely in gait training)  Assist level: 2 helpers (per PT eval)   Max distance: 30 ft (per PT eval)   Walk 10 feet activity   Assist  Walk 10 feet activity did not occur: Safety/medical concerns  Walk 50 feet activity   Assist Walk 50 feet with 2 turns activity did not occur: Safety/medical concerns         Walk 150 feet activity   Assist Walk 150 feet activity did not occur: Safety/medical concerns         Walk 10 feet on uneven surface  activity   Assist Walk 10 feet on uneven surfaces activity did not occur: Safety/medical concerns         Wheelchair     Assist Is the patient using a wheelchair?: Yes Type of Wheelchair: Manual    Wheelchair assist level: Total Assistance - Patient < 25% (pt with poor ability to reach floor with foot for hemipropulsion) Max wheelchair distance: 13ft    Wheelchair 50 feet with 2 turns  activity    Assist        Assist Level: Total Assistance - Patient < 25%   Wheelchair 150 feet activity     Assist      Assist Level: Total Assistance - Patient < 25%   Blood pressure 124/72, pulse 64, temperature 98.6 F (37 C), temperature source Oral, resp. rate 16, height 5' 4.02" (1.626 m), weight 76.6 kg, SpO2 100 %.  Medical Problem List and Plan: 1. Functional deficits secondary to ICH likely secondary to hypertensive crisis             -patient may  shower             -ELOS/Goals: 12/02/21,  min A   -Con't CIR- PT, OT and SLP 2.  Antithrombotics: -DVT/anticoagulation: SCDs             -antiplatelet therapy: N/A 3. Pain Management: Tylenol as needed 4. Mood/Sleep/ADHD: Ritalin 5mg  twice daily--will not titrate further at this point             -antipsychotic agents: N/A 5. Neuropsych/cognition: This patient is not yet capable of making decisions on her own behalf. 6. Skin/Wound Care: Routine skin checks 7. Fluids/Electrolytes/Nutrition: encouraging PO  -intake fair, inconsistent  -asked RD to visit patient re: food choices 8.  Hypertension.  Avapro 300 mg daily, Bystolic 5 mg daily.         -can't push beta blocker any  more d/t bradycardia  7/1- BP controlled- con't regimen Vitals:   11/14/21 2048 11/15/21 0411  BP: 138/78 124/72  Pulse: 66 64  Resp: 16 16  Temp: 98.1 F (36.7 C) 98.6 F (37 C)  SpO2: 97% 100%    9.  Hyperlipidemia.  Lipitor    LOS: 7 days A FACE TO FACE EVALUATION WAS PERFORMED  Esai Stecklein 11/15/2021, 10:22 AM

## 2021-11-16 NOTE — Progress Notes (Signed)
PROGRESS NOTE   Subjective/Complaints:  Pt reports no issues- LBM yesterday AM Slept well.   ROS:   Pt denies SOB, abd pain, CP, N/V/C/D, and vision changes  Objective:   No results found. No results for input(s): "WBC", "HGB", "HCT", "PLT" in the last 72 hours.  No results for input(s): "NA", "K", "CL", "CO2", "GLUCOSE", "BUN", "CREATININE", "CALCIUM" in the last 72 hours.   Intake/Output Summary (Last 24 hours) at 11/16/2021 0942 Last data filed at 11/16/2021 0800 Gross per 24 hour  Intake 1247 ml  Output --  Net 1247 ml        Physical Exam: Vital Signs Blood pressure 122/75, pulse 61, temperature 98 F (36.7 C), resp. rate 18, height 5' 4.02" (1.626 m), weight 74.6 kg, SpO2 99 %.     General: awake, alert, appropriate, sitting up in bed; on phone; NAD HENT: conjugate gaze; oropharynx moist CV: regular rate; no JVD Pulmonary: CTA B/L; no W/R/R- good air movement GI: soft, NT, ND, (+)BS Psychiatric: appropriate Neurological: alert- mild delays in repsonses  Skin: No evidence of breakdown, no evidence of rash Neurologic: alert. Language is fairly fluent and more on point. Still some delays in processing/comprehension. Fair insight at best. motor strength is 5/5 in Left deltoid, bicep, tricep, grip, hip flexor, knee extensors, ankle dorsiflexor and plantar flexor. Good sitting balance. Musculoskeletal: Full range of motion in all 4 extremities. No joint swelling   Assessment/Plan: 1. Functional deficits which require 3+ hours per day of interdisciplinary therapy in a comprehensive inpatient rehab setting. Physiatrist is providing close team supervision and 24 hour management of active medical problems listed below. Physiatrist and rehab team continue to assess barriers to discharge/monitor patient progress toward functional and medical goals  Care Tool:  Bathing    Body parts bathed by patient: Right  arm, Chest, Abdomen, Right upper leg, Left upper leg, Face, Front perineal area   Body parts bathed by helper: Left arm, Buttocks, Left lower leg, Right lower leg     Bathing assist Assist Level: Maximal Assistance - Patient 24 - 49%     Upper Body Dressing/Undressing Upper body dressing   What is the patient wearing?: Pull over shirt    Upper body assist Assist Level: Moderate Assistance - Patient 50 - 74%    Lower Body Dressing/Undressing Lower body dressing      What is the patient wearing?: Pants, Incontinence brief     Lower body assist Assist for lower body dressing: Maximal Assistance - Patient 25 - 49%     Toileting Toileting Toileting Activity did not occur (Clothing management and hygiene only): N/A (no void or bm)  Toileting assist Assist for toileting: Maximal Assistance - Patient 25 - 49%     Transfers Chair/bed transfer  Transfers assist     Chair/bed transfer assist level: Minimal Assistance - Patient > 75%     Locomotion Ambulation   Ambulation assist   Ambulation activity did not occur: Safety/medical concerns (required skilled intervention to participate safely in gait training)  Assist level: 2 helpers (per PT eval)   Max distance: 30 ft (per PT eval)   Walk 10 feet activity   Assist  Walk 10 feet activity did not occur: Safety/medical concerns        Walk 50 feet activity   Assist Walk 50 feet with 2 turns activity did not occur: Safety/medical concerns         Walk 150 feet activity   Assist Walk 150 feet activity did not occur: Safety/medical concerns         Walk 10 feet on uneven surface  activity   Assist Walk 10 feet on uneven surfaces activity did not occur: Safety/medical concerns         Wheelchair     Assist Is the patient using a wheelchair?: Yes Type of Wheelchair: Manual    Wheelchair assist level: Total Assistance - Patient < 25% (pt with poor ability to reach floor with foot for  hemipropulsion) Max wheelchair distance: 170ft    Wheelchair 50 feet with 2 turns activity    Assist        Assist Level: Total Assistance - Patient < 25%   Wheelchair 150 feet activity     Assist      Assist Level: Total Assistance - Patient < 25%   Blood pressure 122/75, pulse 61, temperature 98 F (36.7 C), resp. rate 18, height 5' 4.02" (1.626 m), weight 74.6 kg, SpO2 99 %.  Medical Problem List and Plan: 1. Functional deficits secondary to ICH likely secondary to hypertensive crisis             -patient may  shower             -ELOS/Goals: 12/02/21,  min A   Con't PT, OT and SLP 2.  Antithrombotics: -DVT/anticoagulation: SCDs             -antiplatelet therapy: N/A 3. Pain Management: Tylenol as needed 4. Mood/Sleep/ADHD: Ritalin 5mg  twice daily--will not titrate further at this point             -antipsychotic agents: N/A 5. Neuropsych/cognition: This patient is not yet capable of making decisions on her own behalf. 6. Skin/Wound Care: Routine skin checks 7. Fluids/Electrolytes/Nutrition: encouraging PO  -intake fair, inconsistent  -asked RD to visit patient re: food choices 8.  Hypertension.  Avapro 300 mg daily, Bystolic 5 mg daily.         -can't push beta blocker any  more d/t bradycardia  7/1-7/2- BP looking good- con't regimen Vitals:   11/15/21 1939 11/16/21 0417  BP: 118/70 122/75  Pulse: 62 61  Resp: 17 18  Temp: 98.2 F (36.8 C) 98 F (36.7 C)  SpO2: 99% 99%    9.  Hyperlipidemia.  Lipitor    LOS: 8 days A FACE TO FACE EVALUATION WAS PERFORMED  Oree Hislop 11/16/2021, 9:42 AM

## 2021-11-16 NOTE — Progress Notes (Signed)
Physical Therapy Weekly Progress Note  Patient Details  Name: Denise Herrera MRN: [id number] Date of Birth: Aug 19, 1970  Beginning of progress report period: November 09, 2021 End of progress report period: November 16, 2021  Today's Date: 11/16/2021 PT Individual Time: 1030-1130 PT Individual Time Calculation (min): 60 min   Patient has met 5 of 5 short term goals.  Patient with steady progress this week. Continues to demonstrates impulsivity with mobility, reduced proprioception on the R, R inattention of hemi-body and with visual scanning, and R hemiplegia with most limited hamstring, hip flexor, and DF activation with functional mobility. She currently performs bed mobility with supervision with increased time for R hemi-body management, basic transfers with min A-CGA using RW, gait >100 feet with min/mod A with <25% assist for R limb advancement, and 4x6" steps with min/mod A using L rail.   Patient continues to demonstrate the following deficits decreased cardiorespiratoy endurance, abnormal tone, ataxia, decreased coordination, and decreased motor planning, decreased attention to right, decreased attention, decreased awareness, and decreased safety awareness, and decreased sitting balance, decreased standing balance, decreased postural control, hemiplegia, and decreased balance strategies and therefore will continue to benefit from skilled PT intervention to increase functional independence with mobility.  Patient progressing toward long term goals.  Continue plan of care.  PT Short Term Goals Week 1:  PT Short Term Goal 1 (Week 1): Pt will perform supine<>sit with CGA PT Short Term Goal 1 - Progress (Week 1): Met PT Short Term Goal 2 (Week 1): Pt will perform sit<>stand transfers using LRAD with min assist PT Short Term Goal 2 - Progress (Week 1): Met PT Short Term Goal 3 (Week 1): Pt will perform bed<>chair transfers using LRAD with min assist PT Short Term Goal 3 - Progress (Week 1): Met PT  Short Term Goal 4 (Week 1): Pt will ambulate at least 5f using LRAD with mod assist of 1 and +2 assist as needed for pt safety PT Short Term Goal 4 - Progress (Week 1): Met PT Short Term Goal 5 (Week 1): Pt will initiate stair navigation training PT Short Term Goal 5 - Progress (Week 1): Met Week 2:  PT Short Term Goal 1 (Week 2): Patient will perform basic transfers with CGA consistently using LRAD. PT Short Term Goal 2 (Week 2): Patient will ambulate >150 feet with min A using LRAD. PT Short Term Goal 3 (Week 2): Patient will perform standing balance with CGA >20 min.  Skilled Therapeutic Interventions/Progress Updates:     Patient in bed upon PT arrival. Patient alert and agreeable to PT session. Patient denied pain during session. Patient eager to shower during session, due to only therapy session with PT today, focused on R upper extremity use, sitting balance and postural control, and basic transfers while performing ADLs for showering and dressing using TTB in the shower for seated upper and lower body bathing for safety. Also focused on gait training in the room x2 and out into the hall x1 focused on R hemi-body motor control.   Therapeutic Activity: Bed Mobility: Patient performed supine to sit with supervision in a flat bed. Provided verbal cues for attention to R arm placement. Transfers: Patient performed sit to/from stand using the RW with R hand grip x3 and grab bar in the shower x2 with CGA-min A and hand-over-hand assist for R hand placement (patient able to lift her hand with shoulder flexion, but limited with triceps activation for elbow extension. Provided verbal cues for safety awareness due  to mild impulsivity, R hand placement, pushing up and reaching back with her L hand for safety, and forward weight shift. Patient performed stand pivot BSC over toilet to TTB and min A an cues for AD management and attention to R foot placement. Bathing/Dressing: Patient performed upper body  bathing with set-up assist and supervision, using her R hand with cues >25% of the time utilizing hand-over-hand assist from her L hand for harder to reach areas with mod cues. She performed lower body bathing with min A for PT to wash her R foot due to poor postural control and reduced R leg strength to lift her leg into figure four sitting. Again, she utilized her R hand >25% of the time with the same strategies and cues as above. She initiated washing her hair with set-up assist and supervision, progressed to hand-over-hand assist from PT for using her R hand. Rinsed her hair with total A for a rest break and to ensure thoroughness. Patient dried off with min A for her feet and buttocks for thoroughness, otherwise set-up assist.  She donned a brief with total A due to novelty of task, pants with supervision for cues on technique and use of R hand, patient able to teach-back hemi-body technique, donned a t-shirt with set-up assist recalling hemi-technique learned previously. Donned B socks and shoes with total A for time/energy management.  Patient performed oral care at the sink independently following bathing/dressing.   Gait Training:  Patient ambulated >20 feet into the bathroom, >10 feet to the sink, and >50 feet into the hall with x1 90 deg turn using RW with R hand splint, DF ACE wrap applied on final trial with improved R foot clearance, with mod-max progressing to min-mod A with no assist for R limb advancement and PT blocking R knee to prevent extensor thrust in stance. Ambulated with decreased R hip flexion, knee flexion, and DF, with poor safety awareness and attention to her R leg and foot placement (improved with max cues for attention and sequencing), and intermittent assist for AD management due to overshooting on turns with impulsivity.  [name] in the room at end of session with breaks locked, seat belt alarm set, and all needs within reach.   Therapy Documentation Precautions:   Precautions Precautions: Fall Precaution Comments: R hemiparesis, R inattention, impaired awareness Restrictions Weight Bearing Restrictions: No   Therapy/Group: Individual Therapy  Tyrika Newman L Lonzy Mato PT, DPT  11/16/2021, 7:21 PM

## 2021-11-16 NOTE — Plan of Care (Signed)
  Problem: Consults Goal: RH STROKE PATIENT EDUCATION Description: See Patient Education module for education specifics  Outcome: Progressing   Problem: RH BOWEL ELIMINATION Goal: RH STG MANAGE BOWEL WITH ASSISTANCE Description: STG Manage Bowel with Supervision Assistance. Outcome: Progressing Goal: RH STG MANAGE BOWEL W/MEDICATION W/ASSISTANCE Description: STG Manage Bowel with Medication with Supervision Assistance. Outcome: Progressing   Problem: RH BLADDER ELIMINATION Goal: RH STG MANAGE BLADDER WITH ASSISTANCE Description: STG Manage Bladder With Supervision Assistance Outcome: Progressing Goal: RH STG MANAGE BLADDER WITH MEDICATION WITH ASSISTANCE Description: STG Manage Bladder With Medication With Supervision Assistance. Outcome: Progressing   Problem: RH SKIN INTEGRITY Goal: RH STG MAINTAIN SKIN INTEGRITY WITH ASSISTANCE Description: STG Maintain Skin Integrity With Supervision Assistance. Outcome: Progressing Goal: RH STG ABLE TO PERFORM INCISION/WOUND CARE W/ASSISTANCE Description: STG Able To Perform Incision/Wound Care With Supervision Assistance. Outcome: Progressing   Problem: RH SAFETY Goal: RH STG ADHERE TO SAFETY PRECAUTIONS W/ASSISTANCE/DEVICE Description: STG Adhere to Safety Precautions With Cues and Reminders. Outcome: Progressing Goal: RH STG DECREASED RISK OF FALL WITH ASSISTANCE Description: STG Decreased Risk of Fall With Supervision Assistance. Outcome: Progressing   Problem: RH COGNITION-NURSING Goal: RH STG USES MEMORY AIDS/STRATEGIES W/ASSIST TO PROBLEM SOLVE Description: STG Uses Memory Aids/Strategies With Supervision Assistance to Problem Solve. Outcome: Progressing Goal: RH STG ANTICIPATES NEEDS/CALLS FOR ASSIST W/ASSIST/CUES Description: STG Anticipates Needs/Calls for Assist With Assistance/Cues. Outcome: Progressing   Problem: RH KNOWLEDGE DEFICIT Goal: RH STG INCREASE KNOWLEDGE OF HYPERTENSION Description: Patient will demonstrate  knowledge of HTN medications, dietary restrictions, and blood pressure parameters with educational materials and handouts provided by staff independently at discharge. Outcome: Progressing Goal: RH STG INCREASE KNOWLEGDE OF HYPERLIPIDEMIA Outcome: Progressing

## 2021-11-17 NOTE — Progress Notes (Signed)
Physical Therapy Session Note  Patient Details  Name: Denise Herrera MRN: [id number] Date of Birth: 10-Aug-1970  Today's Date: 11/17/2021 PT Individual Time: 1300-1345 PT Individual Time Calculation (min): 45 min   Short Term Goals: Week 2:  PT Short Term Goal 1 (Week 2): Patient will perform basic transfers with CGA consistently using LRAD. PT Short Term Goal 2 (Week 2): Patient will ambulate >150 feet with min A using LRAD. PT Short Term Goal 3 (Week 2): Patient will perform standing balance with CGA >20 min.  Skilled Therapeutic Interventions/Progress Updates:    Pt reporting still feeling dizzy this PM but willing to attempt exercises for VOR/gaze stabilization. CGA to come to EOB with assist for attention to management of RUE during transition. Once EOB maintained balance with supervision. Instructed in the below exercises in 30 sec intervals each x 3 reps. Minimal increase in symptoms each time with horizontal head turns but increased with vertical and then with VOR cancellation exercise the longest recovery time (1-24min) with a rating 10/10 dizziness. Baseline rating of 3/10 once stabilized. Educated on attempting these again later this evening/tomorrow as well. Handout given also.    Access Code: [email or web address] URL: [email or web address] Date: 11/17/2021 Prepared by: Jill Side  Exercises - Seated Gaze Stabilization with Head Rotation  - 1 x daily - 7 x weekly - 3 sets - 10 reps - Seated Gaze Stabilization with Head Nod  - 1 x daily - 7 x weekly - 3 sets - 10 reps - Seated VOR Cancellation  - 1 x daily - 7 x weekly - 3 sets - 10 reps   Pt needing to use bathroom but declines gait into bathroom at this time due to dizziness. Performed min assist stand step/pivot transfers with cues for safety, attention to RUE management and controlled movements from bed > w/c > toilet > w/c > bed. Performed hand hygiene at sink including incorporation of bimanual use with supervision. Returned  to supine with min assist for RLE management and repositioned in supine independently. All needs in reach.   Therapy Documentation Precautions:  Precautions Precautions: Fall Precaution Comments: R hemiparesis, R inattention, impaired awareness Restrictions Weight Bearing Restrictions: No  Pain: Denies pain, just reports dizziness bothering her today.      Therapy/Group: Individual Therapy  Karolee Stamps Darrol Poke, PT, DPT, CBIS  11/17/2021, 2:54 PM

## 2021-11-17 NOTE — Progress Notes (Signed)
Occupational Therapy Session Note  Patient Details  Name: Denise Herrera MRN: [id number] Date of Birth: 1971-01-31  Today's Date: 11/17/2021 OT Individual Time: 3014-9969 OT Individual Time Calculation (min): 58 min    Short Term Goals: Week 1:  OT Short Term Goal 1 (Week 1): Pt will improve RUE to grasp/release 3/3 items OT Short Term Goal 2 (Week 1): Pt will perform BSC/toilet transfer with MOD A OT Short Term Goal 3 (Week 1): Pt will perform UB dress with hemitechnique with CGA OT Short Term Goal 4 (Week 1): Pt will attend to 2 items on R side with no more than MIN verbal cues  Skilled Therapeutic Interventions/Progress Updates:    Pt received sitting in bed with HOB elevated and agreeable to OT session. Requesting to shower and use the restroom. Required min A to sit EOB with facilitation for LB and UB. Transferred to w/c with Min A. Demonstrating impulsivity with each transfer throughout session. Pt showered with CGA. Completed oral care with facilitation of WB of RUE and grasping toothbrush with RUE. LB dressing/shoes completed with Min A. Transported to day gym for time management. Required Mod verbal cues for sequencing of transfer. Cues provided for foot/hand placement, leaning forward, and slowly sitting down. Once in standing, pt very impulsive to sit. On mat, facilitated pt into quadruped position to encourage RUE/LE weightbearing and scapular/shoulder stability. Pt required max cueing to motor plan getting her body into this position. She required min facilitation at the R elbow to maintain position with cueing for weightshift. She then transitioned to prone on the mat with min A. Mod cueing for R attention/positioning. Supermans completed with mod facilitation for RUE NMR and B lat activation. Pt left with alarm on, call bell in reach, and all needs met.  Therapy Documentation Precautions:  Precautions Precautions: Fall Precaution Comments: R hemiparesis, R inattention, impaired  awareness Restrictions Weight Bearing Restrictions: No   Therapy/Group: Individual Therapy  Catalina Lunger 11/17/2021, 7:14 AM

## 2021-11-17 NOTE — Plan of Care (Signed)
  Problem: RH Attention Goal: LTG Patient will demonstrate this level of attention during functional activites (SLP) Description: LTG:  Patient will will demonstrate this level of attention during functional activites (SLP) Flowsheets (Taken 11/17/2021 1603) Number of minutes patient will demonstrate attention during cognitive/linguistic activities: 15

## 2021-11-17 NOTE — Progress Notes (Signed)
Physical Therapy Session Note  Patient Details  Name: Denise Herrera MRN: [id number] Date of Birth: 06-Nov-1970  Today's Date: 11/17/2021 PT Individual Time: 1005-1103 PT Individual Time Calculation (min): 58 min   Short Term Goals: Week 2:  PT Short Term Goal 1 (Week 2): Patient will perform basic transfers with CGA consistently using LRAD. PT Short Term Goal 2 (Week 2): Patient will ambulate >150 feet with min A using LRAD. PT Short Term Goal 3 (Week 2): Patient will perform standing balance with CGA >20 min.  Skilled Therapeutic Interventions/Progress Updates:     [name] in the room upon PT arrival. Patient alert and agreeable to PT session. Patient denied pain during session, reported 5/10 dizziness in sitting at beginning of session.   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 Patient reports onset of dizziness around 0900 duirng OT session this morning. Reports x1 other occurance a few weeks ago. Patient denies hx of fall or cervical injury, denies previous or active cervical pain, cervical ROM WFL.    Type of Dizziness  Imbalance;Unsteady with head/body turns;"Funny feeling in head";Blurred vision    Frequency of Dizziness x2 in the past few weeks    Duration of Dizziness hours    Symptom Nature Constant;Intermittent   constant with changes in postion and head movements, intermittent over time without intervention   Aggravating Factors No known aggravating factors    Relieving Factors No known relieving factors     Progression of Symptoms No change since onset    History of similar episodes x1      Oculomotor Exam   Oculomotor Alignment Normal    Ocular ROM WFL    Spontaneous Absent    Gaze-induced  Absent    Head shaking Horizontal Absent    Head Shaking Vertical Absent    Smooth Pursuits Intact    Saccades Dysmetria;Hypometric;Undershoots;Comment    Comment --   undershoots with corrective saccade to the L with increased symptoms     Oculomotor Exam-Fixation Suppressed    Left Head Impulse WNL    Right Head Impulse WNL      Vestibulo-Ocular Reflex   VOR 1 Head Only (x 1 viewing) symptomatice for horizontal and vertical VOR x1, symptoms 7/10 after <30 sec      Cognition   Cognition Orientation Level Oriented x 4      Positional Sensitivities   Sit to Supine Mild dizziness    Right Hallpike Severe dizziness   no nystagmus, onset 2-3 sec, duration >60 sec, came in waves   Up from Right Hallpike Mild dizziness    Positional Sensitivities Comments Will continue to monitor positional sensitivities, limited due to patient intolerance to testing      Orthostatics   BP supine (x 5 minutes) 142/88   asymptomatic   HR supine (x 5 minutes) 56    BP sitting 137/91   asymptomatic   HR sitting 65    BP standing (after 1 minute) 140/96   asymptomatic   HR standing (after 1 minute) 77  Results/Recommendations: Patient with symptoms with occulo-motor testing for VOR x1 horizontal and vertical, horizontal saccades, convergence insufficiency >5 cm, and positional/motion sensitivity. Recommend use of gaze stabilization, accommodation, and progressive habituation exercises for reduced symptoms.   Therapeutic Activity: Bed Mobility: Patient performed supine to/from sit with supervision in a flat bed without use of bed rails with cues to bring her R knee to chest to lift her leg onto the bed. Transfers: Patient performed sit to/from stand x1 and stand pivot w/c>bed with min A-CGA using  RW with R hand splint. Provided verbal cues for R hand placement with min A, controlled standing for reduced impulsivity, and reaching back to control descent for safety.  Patient in bed, dizziness 4/10 lying flat, at end of session with breaks locked, bed alarm set, and all needs within reach.   Therapy Documentation Precautions:  Precautions Precautions: Fall Precaution Comments: R hemiparesis, R inattention, impaired awareness Restrictions Weight Bearing Restrictions: No    Therapy/Group: Individual Therapy  Sarita Hakanson L Eleshia Wooley PT, DPT, NCS, CBIS  11/17/2021, 12:38 PM

## 2021-11-17 NOTE — Progress Notes (Signed)
Speech Language Pathology Weekly Progress and Session Note  Patient Details  Name: Denise Herrera MRN: [id number] Date of Birth: [birth year]  Beginning of progress report period: November 09, 2021 End of progress report period: November 17, 2021  Today's Date: 11/17/2021 SLP Individual Time: 1430-1500 SLP Individual Time Calculation (min): 30 min  Short Term Goals: Week 1: SLP Short Term Goal 1 (Week 1): Pt will answer semi-complex yes/no questions with 80% accuracy given Mod A multimodal cues. SLP Short Term Goal 1 - Progress (Week 1): Met SLP Short Term Goal 2 (Week 1): Pt will participate in functional, simple naming tasks targeting convergent, divergent, sentence completion, and confrontation) with 80% accuracy given Min-Mod A multimodal cues. SLP Short Term Goal 2 - Progress (Week 1): Not met SLP Short Term Goal 3 (Week 1): Pt will read at the paragraph level and answer questions pertaining to information read with 80% accuracy given Min-Mod A. SLP Short Term Goal 3 - Progress (Week 1): Met SLP Short Term Goal 4 (Week 1): Pt will text words and phrases to family members to communicate to increase social engagement on 5 opportunities given Min A multimodal cues. SLP Short Term Goal 4 - Progress (Week 1): Other (comment) (Not formally addressed) SLP Short Term Goal 5 (Week 1): Pt will demonstrate awareness of verbal errors during reading given Sup A. SLP Short Term Goal 5 - Progress (Week 1): Not met  New Short Term Goals: Week 2: SLP Short Term Goal 1 (Week 2): Pt will answer complex yes/no questions with 80% accuracy given min A verbal cues. SLP Short Term Goal 2 (Week 2): Pt will participate in functional, simple naming tasks targeting convergent, divergent, sentence completion, and confrontation) with 80% accuracy given Mod A multimodal cues. SLP Short Term Goal 3 (Week 2): Pt will read at the paragraph level and answer questions pertaining to information read with 80% accuracy given Min  A. SLP Short Term Goal 4 (Week 2): Pt will text words and phrases to family members to communicate to increase social engagement on 5 opportunities given Min A multimodal cues. SLP Short Term Goal 5 (Week 2): Pt will demonstrate awareness of verbal errors during reading and or structured language tasks given Sup A. SLP Short Term Goal 6 (Week 2): Patient will sustain attention to functional cognitive-communication tasks for 8 minute direction with min A verbal redirection cues  Weekly Progress Updates: Patient has made slow yet functional gains and has met 2 of 5 STGs this reporting period. STG 4 was not formally addressed during this reporting period due to focus on other communication goals. Patient is currently requiring max A for convergent/divergent naming tasks, min A for awareness of verbal errors, min A for reading comprehension/recall. Pt appears to be limited by internal distractibility at times which impacts her attention to task, thought organization, and self monitoring skills. SLP added STGgoal for sustained attention. Pt does demonstrate gradual improvements with awareness of errors and self correction. Patient and family education is ongoing. Patient would benefit from continued skilled SLP intervention to maximize cognitive functioning and overall functional independence prior to discharge.     Intensity: Minumum of 1-2 x/day, 30 to 90 minutes Frequency: 3 to 5 out of 7 days Duration/Length of Stay: 7/18 Treatment/Interventions: Cueing hierarchy;Functional tasks;Multimodal communication approach;Patient/family education;Speech/Language facilitation;Therapeutic Activities;Cognitive remediation/compensation   Daily Session Skilled Therapeutic Interventions: Skilled ST treatment focused on cognitive-communication goals. Pt was awake and semi-reclined on arrival and appeared sleepy/internally distracted during session. Pt reported 7/10 headache but  was not interested in pain medication. RN  made aware. SLP facilitated session by providing sup A verbal cues and additional time for comprehension of semi-complex yes/no questions to achieve 90% accuracy. SLP facilitated simple convergent naming task with max A to achieve 30% accuracy. Pt appeared increasingly distracted and suspect headache may have contributed to difficulty. Unable to address further language goals due to time constraints. Patient was left in bed with alarm activated and immediate needs within reach at end of session. Continue per current plan of care.       General    Pain Pain Assessment Pain Scale: 0-10 Pain Score: 7  Pain Type: Acute pain Pain Location: Head Pain Orientation: Mid Pain Descriptors / Indicators: Headache Pain Onset: Progressive Pain Intervention(s): Refused Multiple Pain Sites: No  Therapy/Group: Individual Therapy  Patty Sermons 11/17/2021, 4:02 PM

## 2021-11-17 NOTE — Progress Notes (Signed)
PROGRESS NOTE   Subjective/Complaints:  No new issues today. In good spirits  ROS: Patient denies fever, rash, sore throat, blurred vision, dizziness, nausea, vomiting, diarrhea, cough, shortness of breath or chest pain, joint or back/neck pain, headache, or mood change.   Objective:   No results found. No results for input(s): "WBC", "HGB", "HCT", "PLT" in the last 72 hours.  No results for input(s): "NA", "K", "CL", "CO2", "GLUCOSE", "BUN", "CREATININE", "CALCIUM" in the last 72 hours.   Intake/Output Summary (Last 24 hours) at 11/17/2021 1033 Last data filed at 11/16/2021 1834 Gross per 24 hour  Intake 590 ml  Output --  Net 590 ml        Physical Exam: Vital Signs Blood pressure 130/82, pulse 65, temperature 98 F (36.7 C), temperature source Oral, resp. rate 17, height 5' 4.02" (1.626 m), weight 74.6 kg, SpO2 99 %.     Constitutional: No distress . Vital signs reviewed. HEENT: NCAT, EOMI, oral membranes moist Neck: supple Cardiovascular: RRR without murmur. No JVD    Respiratory/Chest: CTA Bilaterally without wheezes or rales. Normal effort    GI/Abdomen: BS +, non-tender, non-distended Ext: no clubbing, cyanosis, or edema Psych: pleasant and cooperative  Skin: No evidence of breakdown, no evidence of rash Neurologic: alert. Language is fairly fluent and more on point. Continued delays in processing/comprehension. Fair insight at best. motor strength is 5/5 in Left deltoid, bicep, tricep, grip, hip flexor, knee extensors, ankle dorsiflexor and plantar flexor. Good sitting balance. Musculoskeletal: Full range of motion in all 4 extremities. No joint swelling   Assessment/Plan: 1. Functional deficits which require 3+ hours per day of interdisciplinary therapy in a comprehensive inpatient rehab setting. Physiatrist is providing close team supervision and 24 hour management of active medical problems listed  below. Physiatrist and rehab team continue to assess barriers to discharge/monitor patient progress toward functional and medical goals  Care Tool:  Bathing    Body parts bathed by patient: Right arm, Chest, Abdomen, Right upper leg, Left upper leg, Face, Front perineal area   Body parts bathed by helper: Left arm, Buttocks, Left lower leg, Right lower leg     Bathing assist Assist Level: Maximal Assistance - Patient 24 - 49%     Upper Body Dressing/Undressing Upper body dressing   What is the patient wearing?: Pull over shirt    Upper body assist Assist Level: Moderate Assistance - Patient 50 - 74%    Lower Body Dressing/Undressing Lower body dressing      What is the patient wearing?: Pants, Incontinence brief     Lower body assist Assist for lower body dressing: Maximal Assistance - Patient 25 - 49%     Toileting Toileting Toileting Activity did not occur (Clothing management and hygiene only): N/A (no void or bm)  Toileting assist Assist for toileting: Maximal Assistance - Patient 25 - 49%     Transfers Chair/bed transfer  Transfers assist     Chair/bed transfer assist level: Minimal Assistance - Patient > 75%     Locomotion Ambulation   Ambulation assist   Ambulation activity did not occur: Safety/medical concerns (required skilled intervention to participate safely in gait training)  Assist level:  2 helpers (per PT eval)   Max distance: 30 ft (per PT eval)   Walk 10 feet activity   Assist  Walk 10 feet activity did not occur: Safety/medical concerns        Walk 50 feet activity   Assist Walk 50 feet with 2 turns activity did not occur: Safety/medical concerns         Walk 150 feet activity   Assist Walk 150 feet activity did not occur: Safety/medical concerns         Walk 10 feet on uneven surface  activity   Assist Walk 10 feet on uneven surfaces activity did not occur: Safety/medical concerns          Wheelchair     Assist Is the patient using a wheelchair?: Yes Type of Wheelchair: Manual    Wheelchair assist level: Total Assistance - Patient < 25% (pt with poor ability to reach floor with foot for hemipropulsion) Max wheelchair distance: 111ft    Wheelchair 50 feet with 2 turns activity    Assist        Assist Level: Total Assistance - Patient < 25%   Wheelchair 150 feet activity     Assist      Assist Level: Total Assistance - Patient < 25%   Blood pressure 130/82, pulse 65, temperature 98 F (36.7 C), temperature source Oral, resp. rate 17, height 5' 4.02" (1.626 m), weight 74.6 kg, SpO2 99 %.  Medical Problem List and Plan: 1. Functional deficits secondary to ICH likely secondary to hypertensive crisis             -patient may  shower             -ELOS/Goals: 12/02/21,  min A   -Continue CIR therapies including PT, OT, and SLP  -DVT/anticoagulation: SCDs             -antiplatelet therapy: N/A 3. Pain Management: Tylenol as needed 4. Mood/Sleep/ADHD: Ritalin 5mg  twice daily--will not titrate further at this point             -antipsychotic agents: N/A 5. Neuropsych/cognition: This patient is not yet capable of making decisions on her own behalf. 6. Skin/Wound Care: Routine skin checks 7. Fluids/Electrolytes/Nutrition: encouraging PO  -intake better over weekend  -appreciate RD assistance--changed to regular diet 8.  Hypertension.  Avapro 300 mg daily, Bystolic 5 mg daily.         -can't push beta blocker any  more d/t bradycardia  7/3 bp controlled Vitals:   11/16/21 1929 11/17/21 0319  BP: 138/88 130/82  Pulse: (!) 57 65  Resp: 18 17  Temp: 98.5 F (36.9 C) 98 F (36.7 C)  SpO2: 100% 99%    9.  Hyperlipidemia.  Lipitor    LOS: 9 days A FACE TO FACE EVALUATION WAS PERFORMED  01/18/22 11/17/2021, 10:33 AM

## 2021-11-18 MED ORDER — MECLIZINE HCL 25 MG PO TABS: 12.5000 mg | ORAL_TABLET | Freq: Three times a day (TID) | ORAL | Status: DC | PRN

## 2021-11-18 NOTE — Progress Notes (Addendum)
PROGRESS NOTE   Subjective/Complaints:  Pt experienced some dizziness in therapy yesterday, while in quadriped per OT. Sx resolved, and she denies none further.   ROS: Patient denies fever, rash, sore throat, blurred vision,  nausea, vomiting, diarrhea, cough, shortness of breath or chest pain, joint or back/neck pain, headache, or mood change.    Objective:   No results found. No results for input(s): "WBC", "HGB", "HCT", "PLT" in the last 72 hours.  No results for input(s): "NA", "K", "CL", "CO2", "GLUCOSE", "BUN", "CREATININE", "CALCIUM" in the last 72 hours.   Intake/Output Summary (Last 24 hours) at 11/18/2021 0919 Last data filed at 11/18/2021 4010 Gross per 24 hour  Intake 440 ml  Output --  Net 440 ml        Physical Exam: Vital Signs Blood pressure 133/86, pulse 62, temperature 98 F (36.7 C), resp. rate 17, height 5' 4.02" (1.626 m), weight 74.6 kg, SpO2 100 %.     Constitutional: No distress . Vital signs reviewed. HEENT: NCAT, EOMI, oral membranes moist Neck: supple Cardiovascular: RRR without murmur. No JVD    Respiratory/Chest: CTA Bilaterally without wheezes or rales. Normal effort    GI/Abdomen: BS +, non-tender, non-distended Ext: no clubbing, cyanosis, or edema Psych: pleasant and cooperative  Skin: No evidence of breakdown, no evidence of rash Neurologic: very alert. Language is fairly fluent and more on point. Continued delays in processing/comprehension, apraxic. Fair insight at best. motor strength is 5/5 in Left deltoid, bicep, tricep, grip, hip flexor, knee extensors, ankle dorsiflexor and plantar flexor. RUE 3/5 prox to 4/5 distal. RLE 4/5 prox to distal.  Musculoskeletal: Full range of motion in all 4 extremities. No joint swelling   Assessment/Plan: 1. Functional deficits which require 3+ hours per day of interdisciplinary therapy in a comprehensive inpatient rehab setting. Physiatrist is  providing close team supervision and 24 hour management of active medical problems listed below. Physiatrist and rehab team continue to assess barriers to discharge/monitor patient progress toward functional and medical goals  Care Tool:  Bathing    Body parts bathed by patient: Right arm, Chest, Abdomen, Right upper leg, Left upper leg, Face, Front perineal area   Body parts bathed by helper: Left arm, Buttocks, Left lower leg, Right lower leg     Bathing assist Assist Level: Maximal Assistance - Patient 24 - 49%     Upper Body Dressing/Undressing Upper body dressing   What is the patient wearing?: Pull over shirt    Upper body assist Assist Level: Moderate Assistance - Patient 50 - 74%    Lower Body Dressing/Undressing Lower body dressing      What is the patient wearing?: Pants, Incontinence brief     Lower body assist Assist for lower body dressing: Maximal Assistance - Patient 25 - 49%     Toileting Toileting Toileting Activity did not occur (Clothing management and hygiene only): N/A (no void or bm)  Toileting assist Assist for toileting: Moderate Assistance - Patient 50 - 74%     Transfers Chair/bed transfer  Transfers assist     Chair/bed transfer assist level: Minimal Assistance - Patient > 75%     Locomotion Ambulation   Ambulation  assist   Ambulation activity did not occur: Safety/medical concerns (required skilled intervention to participate safely in gait training)  Assist level: 2 helpers (per PT eval)   Max distance: 30 ft (per PT eval)   Walk 10 feet activity   Assist  Walk 10 feet activity did not occur: Safety/medical concerns        Walk 50 feet activity   Assist Walk 50 feet with 2 turns activity did not occur: Safety/medical concerns         Walk 150 feet activity   Assist Walk 150 feet activity did not occur: Safety/medical concerns         Walk 10 feet on uneven surface  activity   Assist Walk 10 feet on  uneven surfaces activity did not occur: Safety/medical concerns         Wheelchair     Assist Is the patient using a wheelchair?: Yes Type of Wheelchair: Manual    Wheelchair assist level: Total Assistance - Patient < 25% (pt with poor ability to reach floor with foot for hemipropulsion) Max wheelchair distance: 15ft    Wheelchair 50 feet with 2 turns activity    Assist        Assist Level: Total Assistance - Patient < 25%   Wheelchair 150 feet activity     Assist      Assist Level: Total Assistance - Patient < 25%   Blood pressure 133/86, pulse 62, temperature 98 F (36.7 C), resp. rate 17, height 5' 4.02" (1.626 m), weight 74.6 kg, SpO2 100 %.  Medical Problem List and Plan: 1. Functional deficits secondary to ICH likely secondary to hypertensive crisis             -patient may  shower             -ELOS/Goals: 12/02/21,  min A   -Continue CIR therapies including PT, OT, and SLP. Interdisciplinary team conference today to discuss goals, barriers to discharge, and dc planning.   2. DVT/anticoagulation: SCDs             -antiplatelet therapy: N/A 3. Pain Management: Tylenol as needed 4. Mood/Sleep/ADHD: Ritalin 5mg  twice daily--will not titrate further at this point             -antipsychotic agents: N/A 5. Neuropsych/cognition: This patient is not yet capable of making decisions on her own behalf. 6. Skin/Wound Care: Routine skin checks 7. Fluids/Electrolytes/Nutrition: encouraging PO  -intake improving. Encouraged family to bring in food  -appreciate RD assistance--changed to regular diet 8.  Hypertension.  Avapro 300 mg daily, Bystolic 5 mg daily.         -can't push beta blocker any more d/t bradycardia  7/4 bp fairly controlled Vitals:   11/17/21 1919 11/18/21 0446  BP: 122/80 133/86  Pulse: (!) 58 62  Resp: 17 17  Temp: 98.3 F (36.8 C) 98 F (36.7 C)  SpO2: 100% 100%    9.  Hyperlipidemia.  Lipitor 10. Vestibular sx:  -gaze  stabilization exercises provided by therapy  -will have meclizine available prn as well.     LOS: 10 days A FACE TO FACE EVALUATION WAS PERFORMED  01/19/22 11/18/2021, 9:19 AM

## 2021-11-18 NOTE — Patient Care Conference (Signed)
Inpatient RehabilitationTeam Conference and Plan of Care Update Date: 11/18/2021   Time: 10:00 AM    Patient Name: Denise Herrera      Medical Record Number: [id number]  Date of Birth: 03-Jun-1970 Sex: Female         Room/Bed: 4W02C/4W02C-01 Payor Info: Payor: BLUE CROSS BLUE [employer]IELD / Plan: BCBS OTHER / Product Type: *No Product type* /    Admit Date/Time:  11/08/2021  4:32 PM  Primary Diagnosis:  ICH (intracerebral hemorrhage) [facility])  Hospital Problems: Principal Problem:   ICH (intracerebral hemorrhage) (HCC)    Expected Discharge Date: Expected Discharge Date: 12/02/21  Team Members Present: Physician leading conference: Dr. Faith Rogue Social Worker Present: Lavera Guise, BSW Nurse Present: Kennyth Arnold, RN PT Present: Serina Cowper, PT OT Present: Jake Shark, OT SLP Present: Eilene Ghazi, SLP     Current Status/Progress Goal Weekly Team Focus  Bowel/Bladder   incontinent b/b, lbm 7/2  Offer toileting while awake  Assit with toileting   Swallow/Nutrition/ Hydration             ADL's   UB/LB dressing Min A (hemi-technique reminders), impulsive with transfers, motor planning deficits, showered with close S  CGA/S  RUE NMRE, transfers, R hemi dressing   Mobility   Min A overall, intermittent mod A with R LOB due to inattention/motor planning, gait >100 ft RW with R hand splint  CGA-supervision overall, gait 150 ft, 12 steps  activity tolerance, functional mobility, gait and stair training, R hemi-body NMR, R attention, safety awareness, patient/caregiver education   Communication   sup-to-min A for conversational speech; min-to-mod A for verbal reading with awareness of errors; mod-to-max A for convergent/divergent naming during structured language tasks  mod I expression for speech intelligiblity, min A for word finding, min A for comprehension  word finding, comprehension, awareness of errors, texting, and functional writing tasks   Safety/Cognition/  Behavioral Observations  min A  mod I orientation, sup A attention and emergent awareness, min A memory  attention, awareness, recall   Pain   no c/o pain  Remain pain free  Assess pain qs   Skin   Skin intact  Maintain skin intergrity  Asses skin qs     Discharge Planning:  Discharging home with husband and son. Son [facility] and able to provide 24/7 supervision. Spouse works 4PM-12AM   Team Discussion: BP better, intake better. On Ritalin chronically, maintain current dose. Continent/incontinent bladder, continent bowel. No reported pain. Discharging home with spouse and son. Son to be primary caregiver, works from home. Reports of dizziness and irregular heart rate, vestibular eval showing Occular Motor deficits.   Patient on target to meet rehab goals: yes, CGA to supervision goals. Mod I speech goals, min assist word finding and comprehension. Currently min assist upper body, lower body. Supervision shower. Min assist overall mobility. Loss of balance to right side. Ambulating 100 ft with RW. Supervision to min assist word finding. Min/mod assist verbal reading.  *See Care Plan and progress notes for long and short-term goals.   Revisions to Treatment Plan:  None at this time.   Teaching Needs: Family education, medication management, transfer/gait training, safety awareness, etc.   Current Barriers to Discharge: Incontinence and safety awareness, right inattention  Possible Resolutions to Barriers: Time toileting schedule Cues for safety Cues for right side inattention     Medical Summary Current Status: Pt with some vestibular sx yesterday. better today. bp controlled, intake improved. language and processing better  Barriers to Discharge:  Medical stability   Possible Resolutions to [employer] Focus: daily assessment of labs and pt data   Continued Need for Acute Rehabilitation Level of Care: The patient requires daily medical management by a physician with specialized  training in physical medicine and rehabilitation for the following reasons: Direction of a multidisciplinary physical rehabilitation program to maximize functional independence : Yes Medical management of patient stability for increased activity during participation in an intensive rehabilitation regime.: Yes Analysis of laboratory values and/or radiology reports with any subsequent need for medication adjustment and/or medical intervention. : Yes   I attest that I was present, lead the team conference, and concur with the assessment and plan of the team.   Kennyth Arnold G 11/18/2021, 11:55 AM

## 2021-11-18 NOTE — Progress Notes (Signed)
Occupational Therapy Weekly Progress Note  Patient Details  Name: Denise Herrera MRN: [id number] Date of Birth: October 31, 1970  Beginning of progress report period: November 09, 2021 End of progress report period: November 18, 2021  Today's Date: 11/18/2021 OT Individual Time: 2500-3704 OT Individual Time Calculation (min): 70 min    Patient has met 3 of 4 short term goals.  Camri is making notable progress in CIR demonstrating increased independence with transfers and self-care. Waneda would continue to benefit from therapy to address the deficits listed below including functional use of RUE, motor planning, BADLs. Family education will be completed closer to d/c.   Patient continues to demonstrate the following deficits: muscle weakness and muscle paralysis, impaired timing and sequencing, unbalanced muscle activation, motor apraxia, decreased coordination, and decreased motor planning, decreased awareness, decreased problem solving, and decreased safety awareness, and decreased standing balance, decreased postural control, and decreased balance strategies and therefore will continue to benefit from skilled OT intervention to enhance overall performance with BADL and Reduce care partner burden.  Patient progressing toward long term goals..  Continue plan of care.  OT Short Term Goals Week 2:  OT Short Term Goal 1 (Week 2): Pt will complete UB/LB dressing with CGA. OT Short Term Goal 2 (Week 2): Pt will maintaing dynamic standing balance with CGA for increased independence with functional activties. OT Short Term Goal 3 (Week 2): Pt will demonstrate increased safety awareness to hemi-side. OT Short Term Goal 4 (Week 2): Pt will increase RUE shoulder ROM 10-15 degrees for increased indepdence in ADLs.  Skilled Therapeutic Interventions/Progress Updates:    Pt received sitting in w/c with husband present in the room. Reports feeling much better than yesterday. Pt requesting to shower and get dressed for the  day. Stand pivot to the toilet with Min A for balance and cueing of feet placement. Took 4 steps utilizing the grab bars and sitting on TTB. Showered with (S). Completed UB/LB dressing with heavy emphasis on hemi-technique. Pt with poor carryover of technique but able to dress with Min A. Transported to rehab gym for time management. Completed activity standing at the mat with BUE on mat for WB to facilitate bilateral integration of the UE. Therapist providing proprioceptive input at the [place] and [place]. Pt reaching across midline to tap number on mat with opposing hand to encourage weightbearing. 3 sets of 10x. Completed 2 sets of 10 pushups with therapist providing input mainly on RUE/RLE to increase weightbearing and activation in the tricep and scapular stability. Pt left in w/c with alarm on, call bell in reach, and all needs met.  Saebo Stim One placed on pt's upper trap with middle deltoid to stimulate shoulder abduction muscle activation, as well as increase R attention. No adverse skin reaction before/after tx.  330 pulse width  35 Hz pulse rate  On 8 sec/ off 8 sec  Ramp up/ down 2 sec  Symmetrical Biphasic wave form  Max intensity 115m at 500 Ohm load   Therapy Documentation Precautions:  Precautions Precautions: Fall Precaution Comments: R hemiparesis, R inattention, impaired awareness Restrictions Weight Bearing Restrictions: No  Therapy/Group: Individual Therapy  LCatalina Lunger7/08/2021, 7:14 AM

## 2021-11-18 NOTE — Progress Notes (Signed)
Patient ID: Denise Herrera, female   DOB: 01-Dec-1970, 51 y.o.   MRN: [id number]  Team Conference Report to Patient/Family  Team Conference discussion was reviewed with the patient and caregiver, including goals, any changes in plan of care and target discharge date.  Patient and caregiver express understanding and are in agreement.  The patient has a target discharge date of 12/02/21.  Covering for primary SW, Auria  Sw met with patient and spouse to provide team conference updates. Patient does report having dizziness yesterday but since then has improved. Sw will provide patient with recommendations once provided. No additional questions or concerns. Denise Herrera 11/18/2021, 11:28 AM

## 2021-11-18 NOTE — Progress Notes (Signed)
Physical Therapy Session Note  Patient Details  Name: Denise Herrera MRN: [id number] Date of Birth: Apr 15, 1971  Today's Date: 11/18/2021 PT Individual Time: 1330-1527 PT Individual Time Calculation (min): 117 min   Short Term Goals: Week 2:  PT Short Term Goal 1 (Week 2): Patient will perform basic transfers with CGA consistently using LRAD. PT Short Term Goal 2 (Week 2): Patient will ambulate >150 feet with min A using LRAD. PT Short Term Goal 3 (Week 2): Patient will perform standing balance with CGA >20 min.  Skilled Therapeutic Interventions/Progress Updates:     Session 1: [name] in the room upon PT arrival. Patient alert and agreeable to PT session. Patient denied pain during session, denies dizziness at beginning of session and throughout session.   Patient's schedule adjusted for both sessions in the afternoon to accommodate an alternate patient's schedule request. Patient agreeable to this this morning and provided several rest breaks during session for energy conservation due to increased length of session this afternoon. Educated on stroke recovery, modifiable risk factors, signs of stroke, neuroplasticity, stress management, and coping strategies on rest breaks. Patient stated her main goal is to walk again. Reports she is not in a hurry to return to work and wants to focus on her recovery.   Therapeutic Activity: Patient requested to participate in ice cream social at beginning of session. Focused on use of R hand to use a spoon with backward chaining progressing to hand-over-hand assist for grip and elbow flexion to bring her hand to her mouth x5 bites. Transfers: Patient performed sit to/from stand x9 with CGA using RW and R hand split, L rail, or R rail. Provided verbal cues for attention to R foot placement, forward and midline weight shift, pushing her R foot into the ground to promoted R weight shift and knee/hip extension.  Gait Training:  Patient ambulated 165  feet using RW with R hand splint with PLS with 1/8" wedge and 130 feet using L arm around tharapist's shoulders for increased forward propulsion, facilitation for weight shifting, and increased speed of R limb advancement with mod A, patient progressed to using L rail for last 30 feet due to L x1 LOB requiring max A to correct due to decreased attention to R foot placement, progressed to CGA using L rail. She ambulated 112 feet using RW with R hand splint without AFO or heel wedge focused on DF, hamstring, and hip flexor activation in swing and luteal and eccentric hamstring activation in stance.  Provided patient selected music as motivation and rhythm for cadence throughout gait training.   Neuromuscular Re-ed: Patient performed the following standing balance and upper/lower extremity motor control activities: -standing balance with a mirror for visual feedback 2x2-3 min holding R rail only focused on equal weight bearing (favors L leg) and R grip and weight bearing on rail -ascended/descended 4x6" steps using L rail with min A. Performed step-to gait pattern leading with R for increased foot clearance and consistent placement on step and increased downward push to step up each step to carry-over to swing and stance phase with gait, descended leading with R foot leading for safety with step-to gait pattern. Provided cues for technique and sequencing.   [name] in the room at end of session with breaks locked, seat belt alarm set, and all needs within reach.   Therapy Documentation Precautions:  Precautions Precautions: Fall Precaution Comments: R hemiparesis, R inattention, impaired awareness Restrictions Weight Bearing Restrictions: No  Therapy/Group: Individual Therapy  Onis Markoff L Raynold Blankenbaker PT, DPT, NCS, CBIS  11/18/2021, 3:52 PM

## 2021-11-19 MED ORDER — SORBITOL 70 % SOLN
30.0000 mL | Freq: Every day | Status: DC | PRN
  Administered 2021-11-19: 30 mL via ORAL
  Filled 2021-11-19: qty 30

## 2021-11-19 NOTE — Progress Notes (Signed)
Physical Therapy Session Note  Patient Details  Name: Denise Herrera MRN: [id number] Date of Birth: November 04, 1970  Today's Date: 11/19/2021 PT Individual Time: 1415-1530 PT Individual Time Calculation (min): 75 min   Short Term Goals: Week 2:  PT Short Term Goal 1 (Week 2): Patient will perform basic transfers with CGA consistently using LRAD. PT Short Term Goal 2 (Week 2): Patient will ambulate >150 feet with min A using LRAD. PT Short Term Goal 3 (Week 2): Patient will perform standing balance with CGA >20 min.  Skilled Therapeutic Interventions/Progress Updates:     [name] with her husband in the room upon PT arrival. Patient alert and agreeable to PT session. Patient denied pain during session. Patient reports her husband helped her to/from the bathroom due to the long wait for assistance. Focused initial part of session on hands on training for transfers. Cleared patient's husband for transfers with patient w/c<>bed or BSC during session, as he demonstrated safe technique following PT instructions. Safety plan updated and RN made aware.   Therapeutic Activity: Transfers: Patient performed stand pivot bed<>w/c and w/c<>BSC with min A with her husband assisting. Educated on w/c set-up and parts management, use of gait belt, use of grab bar for standing for peri-care and lower body clothing management, and attention to the patient's R foot and hand to prevent injury due to R inattention. Patient performed sit to/from stand x9 with CGA using RW and R hand split, L rail, or R rail. Provided verbal cues for attention to R foot placement, forward and midline weight shift, pushing her R foot into the ground to promoted R weight shift and knee/hip extension.   Gait Training:  Patient ambulated 80 feet, 180 feet, and 60 feet using RW with R hand splint with PLS AFO focused on DF, hamstring, and hip flexor activation in swing and gluteal and eccentric hamstring activation in stance. Provided  patient selected music as motivation and rhythm for cadence throughout gait training.    Neuromuscular Re-ed: Patient performed the following standing balance and upper/lower extremity motor control activities: -standing balance with a mirror for visual feedback 3x2-3 min with R HHA focused on equal weight bearing (favors L leg) and R grip and weight bearing through therapist's hand  Educated on benefits of high intensity gait training in stroke recovery. Patient reports increased fatigue in the afternoon and would like to move her PT to the morning with put in request with scheduling.   [name] in the room at end of session with breaks locked, seat belt alarm set, and all needs within reach.   Therapy Documentation Precautions:  Precautions Precautions: Fall Precaution Comments: R hemiparesis, R inattention, impaired awareness Restrictions Weight Bearing Restrictions: No    Therapy/Group: Individual Therapy  Jonnathan Birman L Marliss Buttacavoli PT, DPT, NCS, CBIS  11/19/2021, 4:33 PM

## 2021-11-19 NOTE — Progress Notes (Signed)
Occupational Therapy Session Note  Patient Details  Name: Denise Herrera MRN: [id number] Date of Birth: [birth year]  Today's Date: 11/19/2021 OT Individual Time: 7218-2883 OT Individual Time Calculation (min): 72 min    Short Term Goals: Week 2:  OT Short Term Goal 1 (Week 2): Pt will complete UB/LB dressing with CGA. OT Short Term Goal 2 (Week 2): Pt will maintaing dynamic standing balance with CGA for increased independence with functional activties. OT Short Term Goal 3 (Week 2): Pt will demonstrate increased safety awareness to hemi-side. OT Short Term Goal 4 (Week 2): Pt will increase RUE shoulder ROM 10-15 degrees for increased indepdence in ADLs.  Skilled Therapeutic Interventions/Progress Updates:    Pt received supine in bed and agreeable to OT session. Urgently requesting to use the restroom. Pt voided bowel and bladder on the toilet documented in flowsheets. Completed functional mobility to the shower with RW and CGA. Showered with (S) seated on TTB. Stood in the shower with the use of grab bars with (S). Encouraged use of BUE in shower. Pt using RUE to assist with moving wash cloth to increase attention and bilateral integration of BUE. Able to dress UB/LB with set-up assist for LB and Min A for UB only to help don bra while seated. Therapist demonstrating proper hemi-technique and having pt return demonstrate. Pt with good carryover of technique reporting that was much easier. Brushed teeth seated in the w/c using RUE to stabilize toothbrush and LUE to manipulate toothpaste. Transported to rehab gym for time management. Sat forward facing the mat. Preparatory activity done with wash clothes encouraging shoulder horizontal abduction/adduction. Shaving cream placed on mat in various planes. Pt using RUE to connect the dots and "make a rainbow." Utilized shaving cream to increase sensory/tactile input. Transported back to room. Pt reporting limited sensation in RUE/RLE. Sharp/dull assessment  done with no correct identification on R side. Pt left in w/c with alarm on, call bell in reach, and all needs met.   Therapy Documentation Precautions:  Precautions Precautions: Fall Precaution Comments: R hemiparesis, R inattention, impaired awareness Restrictions Weight Bearing Restrictions: No  Therapy/Group: Individual Therapy  Catalina Lunger 11/19/2021, 6:57 AM

## 2021-11-19 NOTE — Progress Notes (Signed)
Speech Language Pathology Daily Session Note  Patient Details  Name: Denise Herrera MRN: [id number] Date of Birth: September 01, 1970  Today's Date: 11/19/2021 SLP Individual Time: 0915-0958 SLP Individual Time Calculation (min): 43 min  Short Term Goals: Week 2: SLP Short Term Goal 1 (Week 2): Pt will answer complex yes/no questions with 80% accuracy given min A verbal cues. SLP Short Term Goal 2 (Week 2): Pt will participate in functional, simple naming tasks targeting convergent, divergent, sentence completion, and confrontation) with 80% accuracy given Mod A multimodal cues. SLP Short Term Goal 3 (Week 2): Pt will read at the paragraph level and answer questions pertaining to information read with 80% accuracy given Min A. SLP Short Term Goal 4 (Week 2): Pt will text words and phrases to family members to communicate to increase social engagement on 5 opportunities given Min A multimodal cues. SLP Short Term Goal 5 (Week 2): Pt will demonstrate awareness of verbal errors during reading and or structured language tasks given Sup A. SLP Short Term Goal 6 (Week 2): Patient will sustain attention to functional cognitive-communication tasks for 8 minute direction with min A verbal redirection cues  Skilled Therapeutic Interventions: Skilled ST treatment focused on language goals. Pt reported using her phone to send emails for work and texting friends/family often. SLP facilitated mod fading to min A verbal and visual cues for texting at the short sentence level in regards to spelling, thought organization, error awareness, and visual scanning. SLP provided education and demonstration on using predictive text, as well as voice-to-text options to increase efficiency and timeliness. SLP also assisted with enlarging font size due to report of visual difficulty. Pt demonstrated good carry over throughout task and stated she felt relieved by the end of session with her ability to use her phone. Patient was left in  wheelchair with alarm activated and immediate needs within reach at end of session. Continue per current plan of care.      Pain Pain Assessment Pain Scale: 0-10 Pain Score: 0-No pain  Therapy/Group: Individual Therapy  Tamala Ser 11/19/2021, 9:23 AM

## 2021-11-19 NOTE — Progress Notes (Signed)
Physical Therapy Session Note  Patient Details  Name: Denise Herrera MRN: [id number] Date of Birth: 19-Sep-1970  Today's Date: 11/19/2021 PT Individual Time: 1035-1105 PT Individual Time Calculation (min): 30 min   Short Term Goals: Week 2:  PT Short Term Goal 1 (Week 2): Patient will perform basic transfers with CGA consistently using LRAD. PT Short Term Goal 2 (Week 2): Patient will ambulate >150 feet with min A using LRAD. PT Short Term Goal 3 (Week 2): Patient will perform standing balance with CGA >20 min.  Skilled Therapeutic Interventions/Progress Updates: Pt presented in w/c agreeable to therapy. Pt denies pain at start of session. Per pt request session focused on gait training/ambulation. Pt transported to day room for time management. Pt initially ambulated ~26f with RW without ace wrap or additional assist. Pt required min to modA with noted narrow BOS, significant ankle rolling with stepping through, R genu recurvatum, small R trunk rotation (possibly due to compensatory measures when attempting to swing RLE through). On second trial PTA donned ace bandage for DF assist and to improve ankle stability. Pt ambulated ~557fwith consistent minA, with PTA providing cues to maintain close proximity to RW as well as verbal cues to step towards R side of RW to improve BOS. Pt continued to demonstrate significant genu recurvatum but was able to decrease with PTA placing hand at knee for increased biofeedback. Pt took third trial ~3058feaving through cones for increased RW management as well as forced awareness of RLE as during previous bouts pt would turn RW but RLE would not stay close to RW. With consistent cues pt was able to stay within RW while performing turns but continued to require minA overall. Pt transported back to room at end of session and remained in w/c. Pt left with belt alarm on, call bell within reach and needs met.      Therapy Documentation Precautions:   Precautions Precautions: Fall Precaution Comments: R hemiparesis, R inattention, impaired awareness Restrictions Weight Bearing Restrictions: No General:   Vital Signs: Therapy Vitals Temp: 98 F (36.7 C) Pulse Rate: (!) 55 Resp: 16 BP: 129/76 Patient Position (if appropriate): Sitting Oxygen Therapy SpO2: 100 % O2 Device: Room Air Pain:   Mobility:   Locomotion :    Trunk/Postural Assessment :    Balance:   Exercises:   Other Treatments:      Therapy/Group: Individual Therapy  Terea Neubauer 11/19/2021, 4:22 PM

## 2021-11-19 NOTE — Progress Notes (Signed)
PROGRESS NOTE   Subjective/Complaints:  No further dizziness yesterday. Up with OT brushing teeth.   ROS: Patient denies fever, rash, sore throat, blurred vision, dizziness, nausea, vomiting, diarrhea, cough, shortness of breath or chest pain, joint or back/neck pain, headache, or mood change.   Objective:   No results found. No results for input(s): "WBC", "HGB", "HCT", "PLT" in the last 72 hours.  No results for input(s): "NA", "K", "CL", "CO2", "GLUCOSE", "BUN", "CREATININE", "CALCIUM" in the last 72 hours.   Intake/Output Summary (Last 24 hours) at 11/19/2021 0844 Last data filed at 11/19/2021 0747 Gross per 24 hour  Intake 500 ml  Output --  Net 500 ml        Physical Exam: Vital Signs Blood pressure 127/81, pulse 64, temperature 98.4 F (36.9 C), resp. rate 16, height 5' 4.02" (1.626 m), weight 74.6 kg, SpO2 99 %.     Constitutional: No distress . Vital signs reviewed. HEENT: NCAT, EOMI, oral membranes moist Neck: supple Cardiovascular: RRR without murmur. No JVD    Respiratory/Chest: CTA Bilaterally without wheezes or rales. Normal effort    GI/Abdomen: BS +, non-tender, non-distended Ext: no clubbing, cyanosis, or edema Psych: pleasant and cooperative a    Skin: No evidence of breakdown, no evidence of rash Neurologic: very alert. Language is fairly fluent and more on point. Continued delays in processing/comprehension, apraxia still present. Fair insight at best. motor strength is 5/5 in Left deltoid, bicep, tricep, grip, hip flexor, knee extensors, ankle dorsiflexor and plantar flexor. RUE 3/5 prox to 4/5 distal. RLE 4/5 prox to distal.  Musculoskeletal: Full range of motion in all 4 extremities. No joint swelling   Assessment/Plan: 1. Functional deficits which require 3+ hours per day of interdisciplinary therapy in a comprehensive inpatient rehab setting. Physiatrist is providing close team supervision  and 24 hour management of active medical problems listed below. Physiatrist and rehab team continue to assess barriers to discharge/monitor patient progress toward functional and medical goals  Care Tool:  Bathing    Body parts bathed by patient: Right arm, Chest, Abdomen, Right upper leg, Left upper leg, Face, Front perineal area   Body parts bathed by helper: Left arm, Buttocks, Left lower leg, Right lower leg     Bathing assist Assist Level: Maximal Assistance - Patient 24 - 49%     Upper Body Dressing/Undressing Upper body dressing   What is the patient wearing?: Pull over shirt    Upper body assist Assist Level: Moderate Assistance - Patient 50 - 74%    Lower Body Dressing/Undressing Lower body dressing      What is the patient wearing?: Pants, Incontinence brief     Lower body assist Assist for lower body dressing: Maximal Assistance - Patient 25 - 49%     Toileting Toileting Toileting Activity did not occur (Clothing management and hygiene only): N/A (no void or bm)  Toileting assist Assist for toileting: Moderate Assistance - Patient 50 - 74%     Transfers Chair/bed transfer  Transfers assist     Chair/bed transfer assist level: Minimal Assistance - Patient > 75%     Locomotion Ambulation   Ambulation assist   Ambulation activity did  not occur: Safety/medical concerns (required skilled intervention to participate safely in gait training)  Assist level: 2 helpers (per PT eval)   Max distance: 30 ft (per PT eval)   Walk 10 feet activity   Assist  Walk 10 feet activity did not occur: Safety/medical concerns        Walk 50 feet activity   Assist Walk 50 feet with 2 turns activity did not occur: Safety/medical concerns         Walk 150 feet activity   Assist Walk 150 feet activity did not occur: Safety/medical concerns         Walk 10 feet on uneven surface  activity   Assist Walk 10 feet on uneven surfaces activity did not  occur: Safety/medical concerns         Wheelchair     Assist Is the patient using a wheelchair?: Yes Type of Wheelchair: Manual    Wheelchair assist level: Total Assistance - Patient < 25% (pt with poor ability to reach floor with foot for hemipropulsion) Max wheelchair distance: 15ft    Wheelchair 50 feet with 2 turns activity    Assist        Assist Level: Total Assistance - Patient < 25%   Wheelchair 150 feet activity     Assist      Assist Level: Total Assistance - Patient < 25%   Blood pressure 127/81, pulse 64, temperature 98.4 F (36.9 C), resp. rate 16, height 5' 4.02" (1.626 m), weight 74.6 kg, SpO2 99 %.  Medical Problem List and Plan: 1. 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 DVT/anticoagulation: SCDs             -antiplatelet therapy: N/A 3. Pain Management: Tylenol as needed 4. Mood/Sleep/ADHD: Ritalin 5mg  twice daily--will not titrate further at this point             -antipsychotic agents: N/A 5. Neuropsych/cognition: This patient is not yet capable of making decisions on her own behalf. 6. Skin/Wound Care: Routine skin checks 7. Fluids/Electrolytes/Nutrition: encouraging PO  -intake improving. Encouraged family to bring in food  -on regular diet 8.  Hypertension.  Avapro 300 mg daily, Bystolic 5 mg daily.         -can't push beta blocker any more d/t bradycardia  7/5 bp  controlled Vitals:   11/18/21 1933 11/19/21 0342  BP: 124/79 127/81  Pulse: (!) 55 64  Resp: 18 16  Temp: 98 F (36.7 C) 98.4 F (36.9 C)  SpO2: 100% 99%    9.  Hyperlipidemia.  Lipitor 10. Vestibular 01/20/22 d/t ICH---better over last 24  -gaze stabilization exercises provided by therapy  -will have meclizine available prn as well.     LOS: 11 days A FACE TO FACE EVALUATION WAS  PERFORMED  [id number] 11/19/2021, 8:44 AM

## 2021-11-20 NOTE — Progress Notes (Signed)
Physical Therapy Session Note  Patient Details  Name: Denise Herrera MRN: [id number] Date of Birth: January 19, 1971  Today's Date: 11/20/2021 PT Individual Time: 1050-1115 PT Individual Time Calculation (min): 25 min   Short Term Goals: Week 2:  PT Short Term Goal 1 (Week 2): Patient will perform basic transfers with CGA consistently using LRAD. PT Short Term Goal 2 (Week 2): Patient will ambulate >150 feet with min A using LRAD. PT Short Term Goal 3 (Week 2): Patient will perform standing balance with CGA >20 min.  Skilled Therapeutic Interventions/Progress Updates:     Pt received seated in [facility] and agrees to therapy. No complaint of pain. WC transport to gym for time management. Pt requests to attempt gait without use of R AFO. Pt performs sit to stand with CGA and cues for hand placement and sequencing. Pt ambulates x30' with RW and R hand splint, requiring modA and with consistent difficulty clearing R foot during swing phase. Pt has most difficulty with turns and safe management of RW and stepping pattern when turning to sit in WC. PT ace wraps R lower extremity to promote dorsiflexion and eversion, then pt ambulates additional x75' with improved foot clearance, but continuing to require modA secondary to occasional crossover steps with R foot, and unsafe transition back to [facility], attempting to sit prior to being safely in front of chair. Following seated rest break, pt ambulates additional 26' with same assist, and cues to decrease stride length to facilitate more effective R foot progression. Pt left seated in WC with alarm intact and all needs within reach.  Therapy Documentation Precautions:  Precautions Precautions: Fall Precaution Comments: R hemiparesis, R inattention, impaired awareness Restrictions Weight Bearing Restrictions: No    Therapy/Group: Individual Therapy  Beau Fanny, PT, DPT 11/20/2021, 1:06 PM

## 2021-11-20 NOTE — Progress Notes (Signed)
PROGRESS NOTE   Subjective/Complaints:  Up working with OT. No new issues overnight.   ROS: Patient denies fever, rash, sore throat, blurred vision, dizziness, nausea, vomiting, diarrhea, cough, shortness of breath or chest pain, joint or back/neck pain, headache, or mood change.   Objective:   No results found. No results for input(s): "WBC", "HGB", "HCT", "PLT" in the last 72 hours.  No results for input(s): "NA", "K", "CL", "CO2", "GLUCOSE", "BUN", "CREATININE", "CALCIUM" in the last 72 hours.   Intake/Output Summary (Last 24 hours) at 11/20/2021 0908 Last data filed at 11/20/2021 0839 Gross per 24 hour  Intake 952 ml  Output 1 ml  Net 951 ml        Physical Exam: Vital Signs Blood pressure 134/88, pulse 68, temperature 97.9 F (36.6 C), resp. rate 18, height 5' 4.02" (1.626 m), weight 74.6 kg, SpO2 100 %.     Constitutional: No distress . Vital signs reviewed. HEENT: NCAT, EOMI, oral membranes moist Neck: supple Cardiovascular: RRR without murmur. No JVD    Respiratory/Chest: CTA Bilaterally without wheezes or rales. Normal effort    GI/Abdomen: BS +, non-tender, non-distended Ext: no clubbing, cyanosis, or edema Psych: pleasant and cooperative  Skin: No evidence of breakdown, no evidence of rash Neurologic: very alert. Language is fairly fluent and more on point. Continued delays in processing/comprehension, apraxia still present. Fair insight at best. motor strength is 5/5 in Left deltoid, bicep, tricep, grip, hip flexor, knee extensors, ankle dorsiflexor and plantar flexor. RUE 3+/5 prox to 4/5 distal. RLE 4/5 prox to distal.  Musculoskeletal: Full range of motion in all 4 extremities. No joint swelling   Assessment/Plan: 1. Functional deficits which require 3+ hours per day of interdisciplinary therapy in a comprehensive inpatient rehab setting. Physiatrist is providing close team supervision and 24 hour  management of active medical problems listed below. Physiatrist and rehab team continue to assess barriers to discharge/monitor patient progress toward functional and medical goals  Care Tool:  Bathing    Body parts bathed by patient: Right arm, Chest, Abdomen, Right upper leg, Left upper leg, Face, Front perineal area   Body parts bathed by helper: Left arm, Buttocks, Left lower leg, Right lower leg     Bathing assist Assist Level: Maximal Assistance - Patient 24 - 49%     Upper Body Dressing/Undressing Upper body dressing   What is the patient wearing?: Pull over shirt    Upper body assist Assist Level: Moderate Assistance - Patient 50 - 74%    Lower Body Dressing/Undressing Lower body dressing      What is the patient wearing?: Pants, Incontinence brief     Lower body assist Assist for lower body dressing: Maximal Assistance - Patient 25 - 49%     Toileting Toileting Toileting Activity did not occur (Clothing management and hygiene only): N/A (no void or bm)  Toileting assist Assist for toileting: Moderate Assistance - Patient 50 - 74%     Transfers Chair/bed transfer  Transfers assist     Chair/bed transfer assist level: Minimal Assistance - Patient > 75%     Locomotion Ambulation   Ambulation assist   Ambulation activity did not occur: Safety/medical  concerns (required skilled intervention to participate safely in gait training)  Assist level: 2 helpers (per PT eval)   Max distance: 30 ft (per PT eval)   Walk 10 feet activity   Assist  Walk 10 feet activity did not occur: Safety/medical concerns        Walk 50 feet activity   Assist Walk 50 feet with 2 turns activity did not occur: Safety/medical concerns         Walk 150 feet activity   Assist Walk 150 feet activity did not occur: Safety/medical concerns         Walk 10 feet on uneven surface  activity   Assist Walk 10 feet on uneven surfaces activity did not occur:  Safety/medical concerns         Wheelchair     Assist Is the patient using a wheelchair?: Yes Type of Wheelchair: Manual    Wheelchair assist level: Total Assistance - Patient < 25% (pt with poor ability to reach floor with foot for hemipropulsion) Max wheelchair distance: 169ft    Wheelchair 50 feet with 2 turns activity    Assist        Assist Level: Total Assistance - Patient < 25%   Wheelchair 150 feet activity     Assist      Assist Level: Total Assistance - Patient < 25%   Blood pressure 134/88, pulse 68, temperature 97.9 F (36.6 C), resp. rate 18, height 5' 4.02" (1.626 m), weight 74.6 kg, SpO2 100 %.  Medical Problem List and Plan: 1. 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 DVT/anticoagulation: SCDs             -antiplatelet therapy: N/A 3. Pain Management: Tylenol as needed 4. Mood/Sleep/ADHD: Ritalin 5mg  twice daily seems to be effective             -antipsychotic agents: N/A 5. Neuropsych/cognition: This patient is not yet capable of making decisions on her own behalf. 6. Skin/Wound Care: Routine skin checks 7. Fluids/Electrolytes/Nutrition: encouraging PO  -intake improving. Encouraged family to bring in food  -on regular diet 8.  Hypertension.  Avapro 300 mg daily, Bystolic 5 mg daily.      -can't push beta blocker any more d/t bradycardia  7/6 bp  controlled Vitals:   11/19/21 1950 11/20/21 0315  BP: 120/78 134/88  Pulse: 63 68  Resp: 20 18  Temp: 98.6 F (37 C) 97.9 F (36.6 C)  SpO2: 100% 100%    9.  Hyperlipidemia.  Lipitor 10. Vestibular 01/21/22 d/t ICH---better over last 24  -gaze stabilization exercises provided by therapy  -  meclizine prn.     LOS: 12 days A FACE TO FACE EVALUATION WAS PERFORMED  EU:[id number] 11/20/2021, 9:08 AM

## 2021-11-20 NOTE — Progress Notes (Signed)
Speech Language Pathology Daily Session Note  Patient Details  Name: Denise Herrera MRN: [id number] Date of Birth: 05-Apr-1971  Today's Date: 11/20/2021 SLP Individual Time: 6808-8110 SLP Individual Time Calculation (min): 43 min  Short Term Goals: Week 2: SLP Short Term Goal 1 (Week 2): Pt will answer complex yes/no questions with 80% accuracy given min A verbal cues. SLP Short Term Goal 2 (Week 2): Pt will participate in functional, simple naming tasks targeting convergent, divergent, sentence completion, and confrontation) with 80% accuracy given Mod A multimodal cues. SLP Short Term Goal 3 (Week 2): Pt will read at the paragraph level and answer questions pertaining to information read with 80% accuracy given Min A. SLP Short Term Goal 4 (Week 2): Pt will text words and phrases to family members to communicate to increase social engagement on 5 opportunities given Min A multimodal cues. SLP Short Term Goal 5 (Week 2): Pt will demonstrate awareness of verbal errors during reading and or structured language tasks given Sup A. SLP Short Term Goal 6 (Week 2): Patient will sustain attention to functional cognitive-communication tasks for 8 minute direction with min A verbal redirection cues  Skilled Therapeutic Interventions: Skilled ST treatment focused on cognitive-communication goals.  Pt was accompanied by her spouse who was receptive to education regarding cognitive-communication deficits and aphasia. Educated on strategies to maximize attention, memory, and word finding. Pt/spouse verbalized understanding through teach back. SLP facilitated session by providing sup A for divergent naming tasks with concrete categories, and min A verbal cues for abstract categories. Pt benefited from semantic and phonemic cues; spouse contributed by providing personalized/relatable semantic cues. Pt exhibited minimal verbal errors at the conversational level with intermittent word finding difficulty and benefited  from semantic feature analysis with min A verbal cues. Pt sustained attention for duration of session with minimal verbal redirection.  Patient was left in wheelchair with alarm activated and immediate needs within reach at end of session. Continue per current plan of care.      Pain Pain Assessment Pain Scale: 0-10 Pain Score: 0-No pain  Therapy/Group: Individual Therapy  Tamala Ser 11/20/2021, 9:52 AM

## 2021-11-20 NOTE — Progress Notes (Signed)
Physical Therapy Session Note  Patient Details  Name: Denise Herrera MRN: [id number] Date of Birth: June 07, 1970  Today's Date: 11/20/2021 PT Individual Time: 1330-1445 PT Individual Time Calculation (min): 75 min   Short Term Goals: Week 2:  PT Short Term Goal 1 (Week 2): Patient will perform basic transfers with CGA consistently using LRAD. PT Short Term Goal 2 (Week 2): Patient will ambulate >150 feet with min A using LRAD. PT Short Term Goal 3 (Week 2): Patient will perform standing balance with CGA >20 min.  Skilled Therapeutic Interventions/Progress Updates:     [name] with her husband in the room upon PT arrival. Patient alert and agreeable to PT session. Patient denied pain during session.  Per OT, patient OOB on her own this morning. Discussed patient's behavior with patient and her husband. Patient reports her impulsive behavior is motivated by waiting on nursing staff for toileting and urgency. PT educated on present deficits limiting safety with independent transfers and poor safety awareness with w/c management. Patient receptive to education, however, continued to state that she would get herself to the bathroom if she was not assisted in time. Informed RN, NT and charge nurse of this and agreed to move patient closer to the nurses station for improved response time with toileting to reduce patient's impulsivity.   Donned R PLS AFO with total A at beginning of session. Educated patient and her husband on technique and discussed wearing shoes and the AFO with all ambulation at home if deemed necessary. Patient reports she does not want to wear shoes at home, but understands the need. Will continue to assess need and appropriateness of AFO during patient's stay.   Therapeutic Activity: Transfers: Patient performed sit to/from stand x3 with CGA-close supervision using RW with R hand splint, patient able to place her R hand in and secure the splint independently once in standing  today. Provided verbal cues for R foot placement, R hand placement, and pushing up from the w/c or chair for safety. Had patient demonstrate w/c transfer into the bathroom, per her "plan" this morning. Patient with significant challenge with pushing the w/c into the bathroom up the incline and poor awareness on the R, required max cues for safety and R attention. Needed max cues for locking w/c breaks and total A for management of leg rests. Patient performed a toilet transfer w/c>toilet using the grab bar with supervision for safety and cues for R foot placement. Patient was continent of bladder, performed peri-care and lower body clothing management with supervision with cues for use of R hand to assist. Discussed safety concerns and patient able to identify where PT assisted her during the task, however, continues to state, "I won't pee on myself or go back to that diaper. I will take myself to the bathroom if I have to." Patient with poor incite into deficits and will require further reinforcement from staff.   Gait Training:  Patient ambulated 179 feet and 219 feet using RW and R hand splint with PLS AFO and shoe cover with min A for balance and RW management, as she pulls the RW to the L. focused on DF, hamstring, and hip flexor activation in swing and gluteal and eccentric hamstring activation in stance.  Patient participated in ice cream social and discussed her progress and goals with Misty Stanley, RT. Educated patient on benefits of participating in a community outing and discussed how and where she would like to go. Plan for a group community outing to  go shopping with Misty Stanley RT next Thursday. Scheduled in [place]. Patient eager to participate in this activity.   [name] in the room at end of session with breaks locked, seat belt and chair alarm set per nursing recs, and all needs within reach.   Therapy Documentation Precautions:  Precautions Precautions: Fall Precaution Comments: R  hemiparesis, R inattention, impaired awareness Restrictions Weight Bearing Restrictions: No    Therapy/Group: Individual Therapy  Earlena Werst L Sayda Grable PT, DPT, NCS, CBIS  11/20/2021, 4:51 PM

## 2021-11-20 NOTE — Progress Notes (Signed)
Occupational Therapy Session Note  Patient Details  Name: Denise Herrera MRN: [id number] Date of Birth: Dec 08, 1970  Today's Date: 11/20/2021 OT Individual Time: 0800-0900 OT Individual Time Calculation (min): 60 min    Short Term Goals: Week 2:  OT Short Term Goal 1 (Week 2): Pt will complete UB/LB dressing with CGA. OT Short Term Goal 2 (Week 2): Pt will maintaing dynamic standing balance with CGA for increased independence with functional activties. OT Short Term Goal 3 (Week 2): Pt will demonstrate increased safety awareness to hemi-side. OT Short Term Goal 4 (Week 2): Pt will increase RUE shoulder ROM 10-15 degrees for increased indepdence in ADLs.  Skilled Therapeutic Interventions/Progress Updates:  Pt received in w/c with bed alarm going off. Pt with very poor awareness to condition- transferring out of bed without any assistance to try and use the restroom. Pt educated on current status and potential harm that could be caused if she fell. Transported to the toilet and voided bowel on the toilet- documented in flowsheets. Stood with (S) at the toilet but had a LOB while trying to stand pivot back to the w/c. Husband present in the room. Discussed with husband present the harm of pt transferring with no one in the room.Transported to the gym for time management. Completed 3 sets of 8 cone stacks. First round, pt reaching across to the left grasping the cone and stacking it on the opposite side. Second round, pt reaching out in all planes to grab and place the cone behind her body. Third round, done standing with RW and pt reaching with RUE while the LUE stabilized. The activity is addressing crossing midline, awareness to R side, dynamic standing balance, and attention. Completed Box and Blocks Assessment.L hand: 37 blocks, R hand: 7 blocks. Transported back to room for time management. Left with 2 alarms on, call bell in reach, and all needs met. Husband in the room.  Therapy  Documentation Precautions:  Precautions Precautions: Fall Precaution Comments: R hemiparesis, R inattention, impaired awareness Restrictions Weight Bearing Restrictions: No Therapy/Group: Individual Therapy  Denise Herrera 11/20/2021, 7:51 AM

## 2021-11-21 DIAGNOSIS — H832X3 Labyrinthine dysfunction, bilateral: Secondary | ICD-10-CM

## 2021-11-21 LAB — GLUCOSE, CAPILLARY: Glucose-Capillary: 87 mg/dL (ref 70–99)

## 2021-11-21 NOTE — Progress Notes (Signed)
Received call from bedside nurse Dahlia Client, RN that patient had an episode about 15 min ago of right lower leg and foot numbness and coldness. Non-focal, LT sensation intact x 4.  Per nurse, patient has had these episodes intermittently over the course of her hospitalization, with symptoms self-resolving within a few minutes.  Per nurse this episode has self resolved.  Will continue to monitor.

## 2021-11-21 NOTE — Progress Notes (Signed)
Physical Therapy Session Note  Patient Details  Name: Joeanna D Delucia MRN: [id number] Date of Birth: 09/01/1970  Today's Date: 11/21/2021 PT Individual Time: 0810-0903 PT Individual Time Calculation (min): 53 min   Short Term Goals: Week 1:  PT Short Term Goal 1 (Week 1): Pt will perform supine<>sit with CGA PT Short Term Goal 1 - Progress (Week 1): Met PT Short Term Goal 2 (Week 1): Pt will perform sit<>stand transfers using LRAD with min assist PT Short Term Goal 2 - Progress (Week 1): Met PT Short Term Goal 3 (Week 1): Pt will perform bed<>chair transfers using LRAD with min assist PT Short Term Goal 3 - Progress (Week 1): Met PT Short Term Goal 4 (Week 1): Pt will ambulate at least 50ft using LRAD with mod assist of 1 and +2 assist as needed for pt safety PT Short Term Goal 4 - Progress (Week 1): Met PT Short Term Goal 5 (Week 1): Pt will initiate stair navigation training PT Short Term Goal 5 - Progress (Week 1): Met Week 2:  PT Short Term Goal 1 (Week 2): Patient will perform basic transfers with CGA consistently using LRAD. PT Short Term Goal 2 (Week 2): Patient will ambulate >150 feet with min A using LRAD. PT Short Term Goal 3 (Week 2): Patient will perform standing balance with CGA >20 min. Week 3:     Skilled Therapeutic Interventions/Progress Updates:   Pt received sitting in WC and agreeable to PT\  Oral care at sink with set up assist from PT.   Transported to rehab gym. Gait training with RW x 150ft + 160ft, forward/reverse x 12 ft each. Side stepping R and L 10ft each.   Seated UE and trunkal NMR.   Sit<>stand transfers with supervision assist from PT with cues for improved RLE position.   Patient returned to room and left sitting in WC with call bell in reach and all needs met.        Therapy Documentation Precautions:  Precautions Precautions: Fall Precaution Comments: R hemiparesis, R inattention, impaired awareness Restrictions Weight Bearing  Restrictions: No General:   Vital Signs:  Pain:   Mobility:   Locomotion :    Trunk/Postural Assessment :    Balance:   Exercises:   Other Treatments:      Therapy/Group: Individual Therapy  Austin E Tucker 11/21/2021, 9:03 AM  

## 2021-11-21 NOTE — Progress Notes (Signed)
PROGRESS NOTE   Subjective/Complaints:  Up ambulating in hall. No new complaints  ROS: Patient denies fever, rash, sore throat, blurred vision, dizziness, nausea, vomiting, diarrhea, cough, shortness of breath or chest pain, joint or back/neck pain, headache, or mood change.   Objective:   No results found. No results for input(s): "WBC", "HGB", "HCT", "PLT" in the last 72 hours.  No results for input(s): "NA", "K", "CL", "CO2", "GLUCOSE", "BUN", "CREATININE", "CALCIUM" in the last 72 hours.   Intake/Output Summary (Last 24 hours) at 11/21/2021 0913 Last data filed at 11/20/2021 1958 Gross per 24 hour  Intake 594 ml  Output --  Net 594 ml        Physical Exam: Vital Signs Blood pressure (!) 126/93, pulse 64, temperature 98 F (36.7 C), resp. rate 18, height 5' 4.02" (1.626 m), weight 74.6 kg, SpO2 99 %.     Constitutional: No distress . Vital signs reviewed. HEENT: NCAT, EOMI, oral membranes moist Neck: supple Cardiovascular: RRR without murmur. No JVD    Respiratory/Chest: CTA Bilaterally without wheezes or rales. Normal effort    GI/Abdomen: BS +, non-tender, non-distended Ext: no clubbing, cyanosis, or edema Psych: pleasant and cooperative  Skin: No evidence of breakdown, no evidence of rash Neurologic: very alert. Improving language. Still apraxic with speech, motor on right. Improving insight. motor strength is 5/5 in Left deltoid, bicep, tricep, grip, hip flexor, knee extensors, ankle dorsiflexor and plantar flexor. RUE 3+/5 prox to 4/5 distal. RLE 4/5 prox to distal.  Musculoskeletal: Full range of motion in all 4 extremities. No joint swelling   Assessment/Plan: 1. Functional deficits which require 3+ hours per day of interdisciplinary therapy in a comprehensive inpatient rehab setting. Physiatrist is providing close team supervision and 24 hour management of active medical problems listed  below. Physiatrist and rehab team continue to assess barriers to discharge/monitor patient progress toward functional and medical goals  Care Tool:  Bathing    Body parts bathed by patient: Right arm, Chest, Abdomen, Right upper leg, Left upper leg, Face, Front perineal area   Body parts bathed by helper: Left arm, Buttocks, Left lower leg, Right lower leg     Bathing assist Assist Level: Maximal Assistance - Patient 24 - 49%     Upper Body Dressing/Undressing Upper body dressing   What is the patient wearing?: Pull over shirt    Upper body assist Assist Level: Moderate Assistance - Patient 50 - 74%    Lower Body Dressing/Undressing Lower body dressing      What is the patient wearing?: Pants, Incontinence brief     Lower body assist Assist for lower body dressing: Maximal Assistance - Patient 25 - 49%     Toileting Toileting Toileting Activity did not occur (Clothing management and hygiene only): N/A (no void or bm)  Toileting assist Assist for toileting: Moderate Assistance - Patient 50 - 74%     Transfers Chair/bed transfer  Transfers assist     Chair/bed transfer assist level: Minimal Assistance - Patient > 75%     Locomotion Ambulation   Ambulation assist   Ambulation activity did not occur: Safety/medical concerns (required skilled intervention to participate safely in gait training)  Assist level: 2 helpers (per PT eval)   Max distance: 30 ft (per PT eval)   Walk 10 feet activity   Assist  Walk 10 feet activity did not occur: Safety/medical concerns        Walk 50 feet activity   Assist Walk 50 feet with 2 turns activity did not occur: Safety/medical concerns         Walk 150 feet activity   Assist Walk 150 feet activity did not occur: Safety/medical concerns         Walk 10 feet on uneven surface  activity   Assist Walk 10 feet on uneven surfaces activity did not occur: Safety/medical concerns          Wheelchair     Assist Is the patient using a wheelchair?: Yes Type of Wheelchair: Manual    Wheelchair assist level: Total Assistance - Patient < 25% (pt with poor ability to reach floor with foot for hemipropulsion) Max wheelchair distance: 145ft    Wheelchair 50 feet with 2 turns activity    Assist        Assist Level: Total Assistance - Patient < 25%   Wheelchair 150 feet activity     Assist      Assist Level: Total Assistance - Patient < 25%   Blood pressure (!) 126/93, pulse 64, temperature 98 F (36.7 C), resp. rate 18, height 5' 4.02" (1.626 m), weight 74.6 kg, SpO2 99 %.  Medical Problem List and Plan: 1. 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 DVT/anticoagulation: SCDs             -antiplatelet therapy: N/A 3. Pain Management: Tylenol as needed 4. Mood/Sleep/ADHD: Ritalin 5mg  twice daily seems to be effective             -antipsychotic agents: N/A 5. Neuropsych/cognition: This patient is not yet capable of making decisions on her own behalf. 6. Skin/Wound Care: Routine skin checks 7. Fluids/Electrolytes/Nutrition: encouraging PO  -intake improved. Family bringing in food  -on regular diet 8.  Hypertension.  Avapro 300 mg daily, Bystolic 5 mg daily.      -can't push beta blocker any more d/t bradycardia  7/7 bp  controlled except for occasional elevation of dbp Vitals:   11/20/21 1937 11/21/21 0445  BP: (!) 148/80 (!) 126/93  Pulse: (!) 56 64  Resp: 20 18  Temp: 98.5 F (36.9 C) 98 F (36.7 C)  SpO2: 100% 99%    9.  Hyperlipidemia.  Lipitor 10. Vestibular 01/22/22 d/t ICH---better over last 24  -gaze stabilization exercises provided by therapy  -  meclizine prn.     LOS: 13 days A FACE TO FACE EVALUATION WAS PERFORMED  KG:[id number] 11/21/2021, 9:13 AM

## 2021-11-21 NOTE — Progress Notes (Signed)
Occupational Therapy Session Note  Patient Details  Name: Denise Herrera MRN: [id number] Date of Birth: [birth year]  Today's Date: 11/21/2021 OT Individual Time: 7412-8786 OT Individual Time Calculation (min): 75 min    Short Term Goals: Week 2:  OT Short Term Goal 1 (Week 2): Pt will complete UB/LB dressing with CGA. OT Short Term Goal 2 (Week 2): Pt will maintaing dynamic standing balance with CGA for increased independence with functional activties. OT Short Term Goal 3 (Week 2): Pt will demonstrate increased safety awareness to hemi-side. OT Short Term Goal 4 (Week 2): Pt will increase RUE shoulder ROM 10-15 degrees for increased indepdence in ADLs.  Skilled Therapeutic Interventions/Progress Updates:    Pt received sitting in w/c and agreeable to OT session. Pt declines need to use restroom or complete ADL.Transported to rehab gym for time management. Completed block practice x10 of stand pivot from w/c to EOM all with CGA for balance by heavy cueing for arm and feet placement as transferring. Pt with poor awareness of RLE. Pt completed 7- piece foam lego design- level 2. Initially, pt with poor problem-solving and difficulty copying design correctly. After further education, pt able to gather correct lego pieces. However, pt with limited grasp strength in RUE and frequently using her LUE to stabilize lego pieces. Second time, pt complete level 3 design which was much more challenging. As structure got taller, was harder to place legos down. LUE assisting with pushing legos down. Pt voiced that this task was challenging to complete due too limited shoulder flexion and lack of wrist stability in extension. Pt transported blocks back into the bin using her R hand. Given home exercise program thera-putty activities. Demonstrated with patient and patient return demonstrated each activity. Placed 6 marbles in putty and pt using R hand to grab them out. In supine, complete 2 sets of 12x chest press.  Initially with Min facilitation at the R wrist, fading to CGA. Activity focusing on bilateral integration of BUE. Cueing provided for pt to watch R hand and squeeze the bar before beginning. Pt left in w/c with alarm on, call bell in reach, and all needs met. Husband present in the room.  Therapy Documentation Precautions:  Precautions Precautions: Fall Precaution Comments: R hemiparesis, R inattention, impaired awareness Restrictions Weight Bearing Restrictions: No  Therapy/Group: Individual Therapy  Catalina Lunger 11/21/2021, 6:55 AM

## 2021-11-21 NOTE — Progress Notes (Signed)
Patient ID: Denise Herrera, female   DOB: [birth year], 51 y.o.   MRN: [id number]  Sw followed up with patient, no questions or concerns. Patient informed primary sw will return next week.

## 2021-11-21 NOTE — Progress Notes (Signed)
Speech Language Pathology Daily Session Note  Patient Details  Name: Denise Herrera MRN: [id number] Date of Birth: [birth year]  Today's Date: 11/21/2021 SLP Individual Time: 1357-1445 SLP Individual Time Calculation (min): 48 min SLP Missed Time: 12 Minutes Missed Time Reason: Nursing care  Short Term Goals: Week 2: SLP Short Term Goal 1 (Week 2): Pt will answer complex yes/no questions with 80% accuracy given min A verbal cues. SLP Short Term Goal 2 (Week 2): Pt will participate in functional, simple naming tasks targeting convergent, divergent, sentence completion, and confrontation) with 80% accuracy given Mod A multimodal cues. SLP Short Term Goal 3 (Week 2): Pt will read at the paragraph level and answer questions pertaining to information read with 80% accuracy given Min A. SLP Short Term Goal 4 (Week 2): Pt will text words and phrases to family members to communicate to increase social engagement on 5 opportunities given Min A multimodal cues. SLP Short Term Goal 5 (Week 2): Pt will demonstrate awareness of verbal errors during reading and or structured language tasks given Sup A. SLP Short Term Goal 6 (Week 2): Patient will sustain attention to functional cognitive-communication tasks for 8 minute direction with min A verbal redirection cues  Skilled Therapeutic Interventions: Pt seen for skilled ST with focus on speech and cognitive goals, missed initial 12 minutes of treatment due to patient receiving shower from NT. SLP facilitating use of texting on patient's smart phone by providing overall Supervision A cues for error awareness and correction. SLP providing patient stylus and platform for cell phone which significantly reduced texting errors and increased timeliness of phone navigation. Pt participating in conversation via prompts of mildly complex wh- questions related to family and personal topics with min fading to Supervision A verbal cues for awareness of verbal errors. Pt with  very occasional word finding difficulties during conversation level speech this date. Pt left in wheelchair with all needs within reach. Cont ST POC.  Pain Pain Assessment Pain Scale: 0-10 Pain Score: 0-No pain  Therapy/Group: Individual Therapy  Tacey Ruiz 11/21/2021, 2:46 PM

## 2021-11-21 NOTE — Progress Notes (Signed)
1721-Pt complaining of right upper arm pain 5/10. PRN tylenol administered per order.   1735-Pt complaining of new numbness to right lower leg. Pt states, "I am scared because I have never felt numbness in this leg." VS WNL, see MAR. Blood glucose 87. Neuro check completed in [facility], no changes from previous assessments. After assessing pt she states that she thinks that her leg might just be cold. Assisted pt to bed and covered with blanket.  Called Tressia Miners, NP with findings.   1750- This nurse and charge nurse reassess pt. Pts husband is now at bedside. Pt is very calm and smiling. Pt states, "Oh I think my leg was just cold, it happens a lot since the stroke." Husband confirms that she has had this same complaint frequently before when her leg was cold. Pt rates pain 0/10.   1800-Updated Misty Stanley, NP. No new orders to be placed at this time.

## 2021-11-22 DIAGNOSIS — I611 Nontraumatic intracerebral hemorrhage in hemisphere, cortical: Secondary | ICD-10-CM

## 2021-11-22 NOTE — Progress Notes (Signed)
Physical Therapy Session Note  Patient Details  Name: Denise Herrera MRN: [id number] Date of Birth: 05-Nov-1970  Today's Date: 11/22/2021 PT Individual Time: 0950-1050 PT Individual Time Calculation (min): 60 min   Short Term Goals: Week 2:  PT Short Term Goal 1 (Week 2): Patient will perform basic transfers with CGA consistently using LRAD. PT Short Term Goal 2 (Week 2): Patient will ambulate >150 feet with min A using LRAD. PT Short Term Goal 3 (Week 2): Patient will perform standing balance with CGA >20 min.  Skilled Therapeutic Interventions/Progress Updates: Pt presents sitting on EOB and agreeable to therapy.  Pt donned B shoes and AFO to R.  Pt transfers sit to stand w/ min A and amb w/ HHA and min A to sink.  Pt stood to brush teeth w/ min A for balance and verbal cues for R LE WB.  Pt amb to w/c.  Pt transferred sit to stand and then positioned R hand in ortho aide on RW w/ min A for balance.  Pt amb 150' w/ min A to Dayroom verbal cues for R LE advancement.  R foot w/ increased dragging per report from another PT.  Pt performed sit to stand using mirror for improved use of RLE, but still relies on L.  Pt placed L foot on Yoga block to increase reliance on R LE.  Pt requires mod A initially, but w/ cueing and breathing technique, able to decrease to min A.  Pt amb x 150' w/ min A and continued cueing for RLE advancement and for safe approach to seat.  Pt amb x 150' to room and sat on EOB.  Pt then performed step-pivot transfer to w/c w/ HHA and cues for speed to improve placement of R LE and safety.  Pt remained sitting w/ chair alarm and all needs in reach.  Spouse present throughout session.     Therapy Documentation Precautions:  Precautions Precautions: Fall Precaution Comments: R hemiparesis, R inattention, impaired awareness Restrictions Weight Bearing Restrictions: No General:   Vital Signs:  Pain:0/10 Pain Assessment Pain Scale: 0-10 Pain Score: 0-No pain        Therapy/Group: Individual Therapy  Lucio Edward 11/22/2021, 11:20 AM

## 2021-11-22 NOTE — Progress Notes (Signed)
Occupational Therapy Session Note  Patient Details  Name: Denise Herrera MRN: [id number] Date of Birth: [birth year]  Today's Date: 11/22/2021 OT Individual Time: 6384-5364 OT Individual Time Calculation (min): 43 min    Short Term Goals: Week 1:  OT Short Term Goal 1 (Week 1): Pt will improve RUE to grasp/release 3/3 items OT Short Term Goal 1 - Progress (Week 1): Met OT Short Term Goal 2 (Week 1): Pt will perform BSC/toilet transfer with MOD A OT Short Term Goal 2 - Progress (Week 1): Met OT Short Term Goal 3 (Week 1): Pt will perform UB dress with hemitechnique with CGA OT Short Term Goal 3 - Progress (Week 1): Progressing toward goal OT Short Term Goal 4 (Week 1): Pt will attend to 2 items on R side with no more than MIN verbal cues OT Short Term Goal 4 - Progress (Week 1): Met  Skilled Therapeutic Interventions/Progress Updates:    Pt received seated in w/c, denies pain, agreeable to therapy. Session focus on self-care retraining, activity tolerance, RUE/RLE NMR in prep for improved ADL/IADL/func mobility performance + decreased caregiver burden.  Total A w/c transport to and from gym for time management/energy conservation.   Short ambulatory transfers throughout session with consistent min A for balance/RLE management + RW and R saddle splint, R hand frequently slipping from splint.  Participated in 3 rounds of corn hole in standing, min A for balance with no UE support. Utilized RUE to pick up and toss bags from basket in front with increased time, able to score 36 bags onto board 1-2 ft away. Required cues to recall previous round points, but able to tally herself with increased time.  Completed 6 continuous minutes on nustep to target RUE/RLE NMR at level 6/10 resistance, average step length of 63 steps/min. Ace wrap and assist to maintain R grip on handle.  Ambulatory toilet transfer in room with min A overall, continent void of bladder and min/mod A to manage clothing over R  hip.  Pt left seated in w/c with friend present, with safety belt alarm engaged, call bell in reach, and all immediate needs met.    Therapy Documentation Precautions:  Precautions Precautions: Fall Precaution Comments: R hemiparesis, R inattention, impaired awareness Restrictions Weight Bearing Restrictions: No  Pain:  denies ADL: See Care Tool for more details.   Therapy/Group: Individual Therapy  Volanda Napoleon MS, OTR/L  11/22/2021, 6:56 AM

## 2021-11-22 NOTE — Progress Notes (Signed)
Patient without complaint of. Slept until 0445. Denise Herrera A

## 2021-11-22 NOTE — Progress Notes (Addendum)
PROGRESS NOTE   Subjective/Complaints:  Discussed cold sensation with pt.  Notices mainly at noc.  Poor temp sensation noted on RIght side when in shower   ROS: Patient denies CP, SOB, N/V/D  Objective:   No results found. No results for input(s): "WBC", "HGB", "HCT", "PLT" in the last 72 hours.  No results for input(s): "NA", "K", "CL", "CO2", "GLUCOSE", "BUN", "CREATININE", "CALCIUM" in the last 72 hours.   Intake/Output Summary (Last 24 hours) at 11/22/2021 0914 Last data filed at 11/21/2021 2245 Gross per 24 hour  Intake 714 ml  Output --  Net 714 ml         Physical Exam: Vital Signs Blood pressure 123/74, pulse 65, temperature (!) 97.4 F (36.3 C), temperature source Oral, resp. rate 18, height 5' 4.02" (1.626 m), weight 74.6 kg, SpO2 98 %.  General: No acute distress Mood and affect are appropriate Heart: Regular rate and rhythm no rubs murmurs or extra sounds Lungs: Clear to auscultation, breathing unlabored, no rales or wheezes Abdomen: Positive bowel sounds, soft nontender to palpation, nondistended Extremities: No clubbing, cyanosis, or edema   Skin: No evidence of breakdown, no evidence of rash Neurologic: very alert. Improving language. Still apraxic with speech, motor on right. Improving insight. motor strength is 5/5 in Left deltoid, bicep, tricep, grip, hip flexor, knee extensors, ankle dorsiflexor and plantar flexor. RUE 3+/5 prox to 4/5 distal. RLE 4/5 prox to distal.   Absent light touch sensation in RUE and RLE  Musculoskeletal: Full range of motion in all 4 extremities. No joint swelling   Assessment/Plan: 1. Functional deficits which require 3+ hours per day of interdisciplinary therapy in a comprehensive inpatient rehab setting. Physiatrist is providing close team supervision and 24 hour management of active medical problems listed below. Physiatrist and rehab team continue to assess  barriers to discharge/monitor patient progress toward functional and medical goals  Care Tool:  Bathing    Body parts bathed by patient: Right arm, Chest, Abdomen, Right upper leg, Left upper leg, Face, Front perineal area   Body parts bathed by helper: Left arm, Buttocks, Left lower leg, Right lower leg     Bathing assist Assist Level: Maximal Assistance - Patient 24 - 49%     Upper Body Dressing/Undressing Upper body dressing   What is the patient wearing?: Pull over shirt    Upper body assist Assist Level: Moderate Assistance - Patient 50 - 74%    Lower Body Dressing/Undressing Lower body dressing      What is the patient wearing?: Pants, Incontinence brief     Lower body assist Assist for lower body dressing: Maximal Assistance - Patient 25 - 49%     Toileting Toileting Toileting Activity did not occur (Clothing management and hygiene only): N/A (no void or bm)  Toileting assist Assist for toileting: Moderate Assistance - Patient 50 - 74%     Transfers Chair/bed transfer  Transfers assist     Chair/bed transfer assist level: Minimal Assistance - Patient > 75%     Locomotion Ambulation   Ambulation assist   Ambulation activity did not occur: Safety/medical concerns (required skilled intervention to participate safely in gait training)  Assist  level: 2 helpers (per PT eval)   Max distance: 30 ft (per PT eval)   Walk 10 feet activity   Assist  Walk 10 feet activity did not occur: Safety/medical concerns        Walk 50 feet activity   Assist Walk 50 feet with 2 turns activity did not occur: Safety/medical concerns         Walk 150 feet activity   Assist Walk 150 feet activity did not occur: Safety/medical concerns         Walk 10 feet on uneven surface  activity   Assist Walk 10 feet on uneven surfaces activity did not occur: Safety/medical concerns         Wheelchair     Assist Is the patient using a wheelchair?:  Yes Type of Wheelchair: Manual    Wheelchair assist level: Total Assistance - Patient < 25% (pt with poor ability to reach floor with foot for hemipropulsion) Max wheelchair distance: 174ft    Wheelchair 50 feet with 2 turns activity    Assist        Assist Level: Total Assistance - Patient < 25%   Wheelchair 150 feet activity     Assist      Assist Level: Total Assistance - Patient < 25%   Blood pressure 123/74, pulse 65, temperature (!) 97.4 F (36.3 C), temperature source Oral, resp. rate 18, height 5' 4.02" (1.626 m), weight 74.6 kg, SpO2 98 %.  Medical Problem List and Plan: 1. 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 DVT/anticoagulation: SCDs             -antiplatelet therapy: N/A 3. Pain Management: Tylenol as needed 4. Mood/Sleep/ADHD: Ritalin 5mg  twice daily seems to be effective             -antipsychotic agents: N/A 5. Neuropsych/cognition: This patient is not yet capable of making decisions on her own behalf. 6. Skin/Wound Care: Routine skin checks 7. Fluids/Electrolytes/Nutrition: encouraging PO  -intake improved. Family bringing in food  -on regular diet 8.  Hypertension.  Avapro 300 mg daily, Bystolic 5 mg daily.      -can't push beta blocker any more d/t bradycardia  Controlled 7/8 Vitals:   11/21/21 2000 11/22/21 0413  BP: 134/75 123/74  Pulse: (!) 55 65  Resp: 18 18  Temp: 98 F (36.7 C) (!) 97.4 F (36.3 C)  SpO2: 100% 98%    9.  Hyperlipidemia.  Lipitor 10. Vestibular 01/23/22 d/t ICH---better over last 24  -gaze stabilization exercises provided by therapy  -  meclizine prn.    11.  RIght side (esp RLE) feels cold.  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  We discussed this may take months to improve, symptoms will tend to be more noticeable at noc due to  reduced distractions  LOS: 14 days A FACE TO FACE EVALUATION WAS PERFORMED  [id number] 11/22/2021, 9:14 AM

## 2021-11-23 NOTE — Progress Notes (Signed)
Occupational Therapy Session Note  Patient Details  Name: Denise Herrera MRN: [id number] Date of Birth: [birth year]  Today's Date: 11/23/2021 OT Individual Time: 9574-7340 OT Individual Time Calculation (min): 60 min    Short Term Goals: Week 1:  OT Short Term Goal 1 (Week 1): Pt will improve RUE to grasp/release 3/3 items OT Short Term Goal 1 - Progress (Week 1): Met OT Short Term Goal 2 (Week 1): Pt will perform BSC/toilet transfer with MOD A OT Short Term Goal 2 - Progress (Week 1): Met OT Short Term Goal 3 (Week 1): Pt will perform UB dress with hemitechnique with CGA OT Short Term Goal 3 - Progress (Week 1): Progressing toward goal OT Short Term Goal 4 (Week 1): Pt will attend to 2 items on R side with no more than MIN verbal cues OT Short Term Goal 4 - Progress (Week 1): Met Week 2:  OT Short Term Goal 1 (Week 2): Pt will complete UB/LB dressing with CGA. OT Short Term Goal 2 (Week 2): Pt will maintaing dynamic standing balance with CGA for increased independence with functional activties. OT Short Term Goal 3 (Week 2): Pt will demonstrate increased safety awareness to hemi-side. OT Short Term Goal 4 (Week 2): Pt will increase RUE shoulder ROM 10-15 degrees for increased indepdence in ADLs.  Skilled Therapeutic Interventions/Progress Updates:  Patient met lying supine in bed in agreement with OT treatment session. 0/10 pain reported at rest and with activity. Supine to EOB with supervision A and use of bed features. Anterior scoots toward EOB with supervision A for safety and awareness of RUE (demonstrates decreased awareness of R side of body). Able to don L shoe with set-up and R shoe and AFO with Mod A. Sit to stand from EOB to RW with cues for hand placement and CGA-SBA. Mobility to walk-in shower with RW and Min A with cues for RLE management. Although [name], she compensates with use of LUE initially for bathing. Given R wash mitten for "forced use". Able to  wash LUE and LLE with RUE. Uses non-dominant LUE to wash buttocks, front perineal area and RUE/RLE. Completes UB bathing/dressing with Min A and LB bathing/dressing with Min A overall. Max cues initially for incorporation of RUE into ADL tasks, attention to R and pacing as patient tends to move very quickly. Completes grooming tasks seated (for energy conservation after bathing at shower level) at sink level with supervision A. Uses LUE for completion of oral hygiene despite cues for use of R. Would benefit from modified constraint induced therapy. Session concluded with patient seated in wc with call bell within reach, belt alarm and chair pad alarm activated and all needs met. MD present for daily visit.  Therapy Documentation Precautions:  Precautions Precautions: Fall Precaution Comments: R hemiparesis, R inattention, impaired awareness Restrictions Weight Bearing Restrictions: No General:   Therapy/Group: Individual Therapy  Prithvi Kooi R Howerton-Davis 11/23/2021, 7:43 AM

## 2021-11-23 NOTE — Progress Notes (Signed)
PROGRESS NOTE   Subjective/Complaints:  Just finished with OT, still with poor sensation RIght side, OT did some forced use RUE   ROS: Patient denies CP, SOB, N/V/D  Objective:   No results found. No results for input(s): "WBC", "HGB", "HCT", "PLT" in the last 72 hours.  No results for input(s): "NA", "K", "CL", "CO2", "GLUCOSE", "BUN", "CREATININE", "CALCIUM" in the last 72 hours.   Intake/Output Summary (Last 24 hours) at 11/23/2021 0935 Last data filed at 11/23/2021 0700 Gross per 24 hour  Intake 720 ml  Output --  Net 720 ml         Physical Exam: Vital Signs Blood pressure 129/82, pulse (!) 54, temperature (!) 97.5 F (36.4 C), temperature source Oral, resp. rate 18, height 5' 4.02" (1.626 m), weight 74.6 kg, SpO2 98 %.  General: No acute distress Mood and affect are appropriate Heart: Regular rate and rhythm no rubs murmurs or extra sounds Lungs: Clear to auscultation, breathing unlabored, no rales or wheezes Abdomen: Positive bowel sounds, soft nontender to palpation, nondistended Extremities: No clubbing, cyanosis, or edema   Skin: No evidence of breakdown, no evidence of rash Neurologic: very alert. Improving language. Still apraxic with speech, motor on right. Improving insight. motor strength is 5/5 in Left deltoid, bicep, tricep, grip, hip flexor, knee extensors, ankle dorsiflexor and plantar flexor. RUE 3+/5 prox to 4/5 distal. RLE 4/5 prox to distal.   Absent light touch sensation in RUE and RLE  Musculoskeletal: Full range of motion in all 4 extremities. No joint swelling   Assessment/Plan: 1. Functional deficits which require 3+ hours per day of interdisciplinary therapy in a comprehensive inpatient rehab setting. Physiatrist is providing close team supervision and 24 hour management of active medical problems listed below. Physiatrist and rehab team continue to assess barriers to  discharge/monitor patient progress toward functional and medical goals  Care Tool:  Bathing    Body parts bathed by patient: Right arm, Chest, Abdomen, Right upper leg, Left upper leg, Face, Front perineal area   Body parts bathed by helper: Left arm, Buttocks, Left lower leg, Right lower leg     Bathing assist Assist Level: Maximal Assistance - Patient 24 - 49%     Upper Body Dressing/Undressing Upper body dressing   What is the patient wearing?: Pull over shirt    Upper body assist Assist Level: Moderate Assistance - Patient 50 - 74%    Lower Body Dressing/Undressing Lower body dressing      What is the patient wearing?: Pants, Incontinence brief     Lower body assist Assist for lower body dressing: Maximal Assistance - Patient 25 - 49%     Toileting Toileting Toileting Activity did not occur (Clothing management and hygiene only): N/A (no void or bm)  Toileting assist Assist for toileting: Moderate Assistance - Patient 50 - 74%     Transfers Chair/bed transfer  Transfers assist     Chair/bed transfer assist level: Minimal Assistance - Patient > 75%     Locomotion Ambulation   Ambulation assist   Ambulation activity did not occur: Safety/medical concerns (required skilled intervention to participate safely in gait training)  Assist level: Minimal Assistance -  Patient > 75% Assistive device: Walker-rolling Max distance: 150   Walk 10 feet activity   Assist  Walk 10 feet activity did not occur: Safety/medical concerns  Assist level: Minimal Assistance - Patient > 75% Assistive device: Walker-rolling   Walk 50 feet activity   Assist Walk 50 feet with 2 turns activity did not occur: Safety/medical concerns  Assist level: Minimal Assistance - Patient > 75% Assistive device: Walker-rolling    Walk 150 feet activity   Assist Walk 150 feet activity did not occur: Safety/medical concerns  Assist level: Minimal Assistance - Patient >  75% Assistive device: Walker-rolling    Walk 10 feet on uneven surface  activity   Assist Walk 10 feet on uneven surfaces activity did not occur: Safety/medical concerns         Wheelchair     Assist Is the patient using a wheelchair?: Yes Type of Wheelchair: Manual    Wheelchair assist level: Total Assistance - Patient < 25% (pt with poor ability to reach floor with foot for hemipropulsion) Max wheelchair distance: 117ft    Wheelchair 50 feet with 2 turns activity    Assist        Assist Level: Total Assistance - Patient < 25%   Wheelchair 150 feet activity     Assist      Assist Level: Total Assistance - Patient < 25%   Blood pressure 129/82, pulse (!) 54, temperature (!) 97.5 F (36.4 C), temperature source Oral, resp. rate 18, height 5' 4.02" (1.626 m), weight 74.6 kg, SpO2 98 %.  Medical Problem List and Plan: 1. 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 DVT/anticoagulation: SCDs             -antiplatelet therapy: N/A 3. Pain Management: Tylenol as needed 4. Mood/Sleep/ADHD: Ritalin 5mg  twice daily seems to be effective             -antipsychotic agents: N/A 5. Neuropsych/cognition: This patient is not yet capable of making decisions on her own behalf. 6. Skin/Wound Care: Routine skin checks 7. Fluids/Electrolytes/Nutrition: encouraging PO  -intake improved. Family bringing in food  -on regular diet 8.  Hypertension.  Avapro 300 mg daily, Bystolic 5 mg daily.      -can't push beta blocker any more d/t bradycardia  Controlled 7/9 Vitals:   11/22/21 1936 11/23/21 0317  BP: 135/80 129/82  Pulse: (!) 56 (!) 54  Resp: 18 18  Temp: 97.9 F (36.6 C) (!) 97.5 F (36.4 C)  SpO2: 99% 98%    9.  Hyperlipidemia.  Lipitor 10. Vestibular 01/24/22 d/t ICH---better over last  24  -gaze stabilization exercises provided by therapy  -  meclizine prn.    11.  RIght side (esp RLE) feels cold.  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  We discussed this may take months to improve, symptoms will tend to be more noticeable at noc due to reduced distractions  LOS: 15 days A FACE TO FACE EVALUATION WAS PERFORMED  CX:[id number] 11/23/2021, 9:35 AM

## 2021-11-23 NOTE — Progress Notes (Signed)
Resting quietly Without complaint of this shift. Refused scheduled senna s. Denise Herrera A

## 2021-11-23 NOTE — Progress Notes (Signed)
Physical Therapy Session Note  Patient Details  Name: Denise Herrera MRN: [id number] Date of Birth: 1971-01-27  Today's Date: 11/23/2021 PT Individual Time: 1315-1345 PT Individual Time Calculation (min): 30 min   Short Term Goals: Week 2:  PT Short Term Goal 1 (Week 2): Patient will perform basic transfers with CGA consistently using LRAD. PT Short Term Goal 2 (Week 2): Patient will ambulate >150 feet with min A using LRAD. PT Short Term Goal 3 (Week 2): Patient will perform standing balance with CGA >20 min.  Skilled Therapeutic Interventions/Progress Updates:     [name] with R PLS AFO and tennis shoes donned in the room with NT preparing to go to the bathroom upon PT arrival. Patient alert and agreeable to PT session. Patient denied pain during session. Patient reports increased stiffness and numbness in her R hemi-body due to reduced mobility today.   Therapeutic Activity: Bed Mobility: Patient performed sit to supine with supervision in a flat bed without use of bed rails. Provided cues for knee to chest to lift her R leg onto the bed.  Transfers: Patient performed stand pivot w/c>toilet using grab bar and w/c>bed using bed rail with close supervision and min cues for R foot placement. She performed sit to/from stand from the toilet with supervision using RW with CGA. Provided verbal cues for hand placement and securing strap. Patient was continent of bladder during toileting, performed peri-care and lower body clothing management independently with supervision-CGA for standing balance. Patient performed hand hygiene independently at the sink with supervision for standing balance, using hips against the sink to balance without upper extremity support.   Gait Training:  Patient ambulated >200 feet using RW with R hand splint and R PLS AFO with CGA. Ambulated with decreased gait speed, decreased step length and height on R, decreased R weight shift and stance time, forward trunk lean,  and downward head gaze. Patient did not require assist with AD management this session. Provided verbal cues for pushing RW equally between B upper extremities, increased R hamstring activation in pre-swing and stance for increased foot clearance in swing and reduced extensor thrust in stance, cued patient for looking ahead with reduced foot clearance without visual feedback, and increased lateral hip activation in stance for improved stability.  Patient in bed at end of session with breaks locked, bed alarm set, and all needs within reach.   Therapy Documentation Precautions:  Precautions Precautions: Fall Precaution Comments: R hemiparesis, R inattention, impaired awareness Restrictions Weight Bearing Restrictions: No   Therapy/Group: Individual Therapy  Blaire Palomino L Magaly Pollina PT, DPT, NCS, CBIS  11/23/2021, 3:02 PM

## 2021-11-24 NOTE — Progress Notes (Signed)
Occupational Therapy Session Note  Patient Details  Name: Denise Herrera MRN: [id number] Date of Birth: 1971-01-06  Session 1: Today's Date: 11/24/2021 OT Individual Time: 7915-0569 OT Individual Time Calculation (min): 55 min   Session 2: Today's Date: 11/24/2021 OT Individual Time: 7948-0165 OT Individual Time Calculation (min): 45 min   Session 3: Today's Date: 11/24/2021 OT Individual Time: 5374-8270 OT Individual Time Calculation (min): 45 min    Short Term Goals: Week 2:  OT Short Term Goal 1 (Week 2): Pt will complete UB/LB dressing with CGA. OT Short Term Goal 2 (Week 2): Pt will maintaing dynamic standing balance with CGA for increased independence with functional activties. OT Short Term Goal 3 (Week 2): Pt will demonstrate increased safety awareness to hemi-side. OT Short Term Goal 4 (Week 2): Pt will increase RUE shoulder ROM 10-15 degrees for increased indepdence in ADLs.  Skilled Therapeutic Interventions/Progress Updates:    Session 1: Pt received sitting EOB working on hand exercises. Pt requesting to take a shower. Pt stood EOB with RW with CGA and completed functional mobility to the shower with (CGA). Able to doff clothing with (S) while seated in the shower and utilized correct hemi-technique without cueing. Able to shower with (S) with shower mit on. Stand pivot transfer with grab bars to w/c and transported to sink for oral care and hygiene. Demonstrated 110 degrees of shoulder flexion while putting on deoderant. Pt gathered all clothing items from the dresser while standing with RW and dressed CGA overall sitting in the w/c. Pt needed Min A to clasp bra and adjust it in the back and to don AFO. Completed functional mobility for 100 ft with the RW with R orthosis, Min A for step cues and to look up. Throughout session, encouraged use of BUE. Pt with strong preference for LUE. Pt left with alarm on, call bell in reach, and all needs met.  Session 2: Pt received sitting  in w/c and agreeable to OT session. Stood with RW with CGA and completed functional mobility to the bathroom. Voided urine on the toilet. Completed peri-hygiene with (S) and donned pants with CGA. Transported to day gym for time management. Seated activity at the table with a deck of cards. Pt using RUE to flip one card over at a time and place it in a pile to her right. Activity focusing on coordination, motor smoothness, and supination/pronation of RUE. Stood at window with LUE stabilizing on RW and RUE WB on the window. Activity focusing on increasing shoulder flexion and horizontal abd/add. Pt with difficulty keeping wash cloth under hand. Noted increase in shoulder flexion from last week. Therapist providing intermittent proximal/distal input to facilitate. Pt left in w/c with husband in the room, call bell in reach, and all needs met. Saebo left on RUE as mentioned below.  Saebo Stim One was placed on pt's anterior deltoid to improve shoulder flexion and attention to RUE. No c/o pain before or after tx or adverse skin reaction. 60 min unattended. 330 pulse width  35 Hz pulse rate  On 8 sec/ off 8 sec  Ramp up/ down 2 sec  Symmetrical Biphasic wave form  Max intensity 17m at 500 Ohm load   Session 3: Pt receive sitting in w/c with husband present in the room. Declined need to use the restroom. Transported to day gym for time management. Sat EOB and completed clothes pin activity using yellow and red clothes pin 2 sets of 6x on middle level. Therapist providing facilitation  at the biceps and wrist as needed for facilitation and stabilization. Rest break provided in between d/t fatigue. Pt stood at an elevated mat and towel placed under BUE. Attempted isolated arm movement forward/back and then bilaterally encouraging shoulder flexion and core activation. Completed 2 sets of 10x. Transported back to room and left in w/c with alarm on, call bell in reach, and all needs met.  Therapy  Documentation Precautions:  Precautions Precautions: Fall Precaution Comments: R hemiparesis, R inattention, impaired awareness Restrictions Weight Bearing Restrictions: No Therapy/Group: Individual Therapy  Danilyn Cocke 11/24/2021, 7:05 AM

## 2021-11-24 NOTE — Progress Notes (Signed)
Physical Therapy Session Note  Patient Details  Name: Denise Herrera MRN: [id number] Date of Birth: February 07, 1971  Today's Date: 11/24/2021 PT Individual Time: 0913-1012 PT Individual Time Calculation (min): 59 min   Short Term Goals: Week 1:  PT Short Term Goal 1 (Week 1): Pt will perform supine<>sit with CGA PT Short Term Goal 1 - Progress (Week 1): Met PT Short Term Goal 2 (Week 1): Pt will perform sit<>stand transfers using LRAD with min assist PT Short Term Goal 2 - Progress (Week 1): Met PT Short Term Goal 3 (Week 1): Pt will perform bed<>chair transfers using LRAD with min assist PT Short Term Goal 3 - Progress (Week 1): Met PT Short Term Goal 4 (Week 1): Pt will ambulate at least 49f using LRAD with mod assist of 1 and +2 assist as needed for pt safety PT Short Term Goal 4 - Progress (Week 1): Met PT Short Term Goal 5 (Week 1): Pt will initiate stair navigation training PT Short Term Goal 5 - Progress (Week 1): Met Week 2:  PT Short Term Goal 1 (Week 2): Patient will perform basic transfers with CGA consistently using LRAD. PT Short Term Goal 2 (Week 2): Patient will ambulate >150 feet with min A using LRAD. PT Short Term Goal 3 (Week 2): Patient will perform standing balance with CGA >20 min. Week 3:     Skilled Therapeutic Interventions/Progress Updates:     Wc propulsion w/bilat Les w/frequent cues to attend to RLE w/task, 546fas pregait warmup, repeated x 3056furing session.  Sidestepping at hallway rail [id number] w/cga, slow cadence due to decreased RLE placement control.  Gait Training:  Patient ambulated 245 feet using RW with R hand splint and R PLS AFO with CGA. Ambulated with decreased gait speed, decreased step length and height on R, decreased R weight shift and stance time, forward trunk lean, and downward head gaze. Patient did not require assist with AD management this session. Provided verbal cues for increased R hamstring activation in pre-swing and stance for  increased foot clearance in swiing,  cued patient for looking ahead with reduced foot clearance without visual feedback, and increased lateral hip activation in stance for improved stability.   Stairs: Ascended/descended 4 stairs w/2 rails, step to gait, cues for sequencing. Reviewed home set up and iinstructed pt w/lateral approach w/single rail on r. Ascended/descended 4 stairs w/single rail on R w/min assist and max cues for sequencing/R foot placement, staggering of feet Demonstrated staggared foot placement again for pt then repeated. Ascended/descended 4 stairs w/single rail on R w/min assist, no cueing required except occasional cues to reposition r hand on handrail when pt loses grip of rail  Pt transported to room. Pt left oob in wc w/alarm belt and chair alarm set and needs in reach   Therapy Documentation Precautions:  Precautions Precautions: Fall Precaution Comments: R hemiparesis, R inattention, impaired awareness Restrictions Weight Bearing Restrictions: No G  Therapy/Group: Individual Therapy BarbCallie Fielding  [place]0/2023, 10:13 AM

## 2021-11-24 NOTE — Progress Notes (Signed)
Provided handouts on Bystolic and Avapro.

## 2021-11-24 NOTE — Progress Notes (Addendum)
Speech Language Pathology Weekly Progress and Session Note  Patient Details  Name: Denise Herrera MRN: [id number] Date of Birth: Jan 19, 1971  Beginning of progress report period: November 17, 2021 End of progress report period: November 24, 2021  Today's Date: 11/24/2021 SLP Individual Time: 1400-1503 SLP Individual Time Calculation (min): 63 min  Short Term Goals: Week 2: SLP Short Term Goal 1 (Week 2): Pt will answer complex yes/no questions with 80% accuracy given min A verbal cues. SLP Short Term Goal 1 - Progress (Week 2): Met SLP Short Term Goal 2 (Week 2): Pt will participate in functional, simple naming tasks targeting convergent, divergent, sentence completion, and confrontation) with 80% accuracy given Mod A multimodal cues. SLP Short Term Goal 2 - Progress (Week 2): Met SLP Short Term Goal 3 (Week 2): Pt will read at the paragraph level and answer questions pertaining to information read with 80% accuracy given Min A. SLP Short Term Goal 3 - Progress (Week 2): Met SLP Short Term Goal 4 (Week 2): Pt will text words and phrases to family members to communicate to increase social engagement on 5 opportunities given Min A multimodal cues. SLP Short Term Goal 4 - Progress (Week 2): Met SLP Short Term Goal 5 (Week 2): Pt will demonstrate awareness of verbal errors during reading and or structured language tasks given Sup A. SLP Short Term Goal 5 - Progress (Week 2): Met SLP Short Term Goal 6 (Week 2): Patient will sustain attention to functional cognitive-communication tasks for 8 minute direction with min A verbal redirection cues SLP Short Term Goal 6 - Progress (Week 2): Met  New Short Term Goals: Week 3: SLP Short Term Goal 1 (Week 3): STG=LTG due to ELOS  Weekly Progress Updates: Patient has made excellent gains and has met 6 of 6 STGs this reporting period due to improved expressive/receptive communication skills, higher level word finding, reading accuracy and comprehension, error  awareness, sustained attention, and awareness. Pt is currently completing language tasks with supervision-to-min A and is on track to meeting long-term goals. Patient and family education is ongoing. Patient would benefit from continued skilled SLP intervention to maximize language/cognitive-communication functioning and overall functional independence prior to discharge.     Intensity: Minumum of 1-2 x/day, 30 to 90 minutes Frequency: 3 to 5 out of 7 days Duration/Length of Stay: 7/18 Treatment/Interventions: Cueing hierarchy;Functional tasks;Multimodal communication approach;Patient/family education;Speech/Language facilitation;Therapeutic Activities;Cognitive remediation/compensation   Daily Session Skilled Therapeutic Interventions: Skilled ST treatment focused on language and cognitive-communication goals. SLP facilitated session by providing sup A verbal cues for comprehension of complex yes/no questions and commands; sup A for convergent and divergent naming; sup A verbal cues for reading at the paragraph level and filling in the blank to complete comprehensive story; sup-to-min A verbal cues for awareness of verbal errors during reading task; sup A verbal redirection cues for attention to task. Pt was oriented to all concepts with mod I with use of her phone. Pt reports improved texting/email efficiency with use of her phone and appropriate recall from previous ST session. Pt acknowledged visual deficits impacting R side. Pt is making steady progress toward LTGs. Patient was left in wheelchair with alarm activated and immediate needs within reach at end of session. Continue per current plan of care.      General    Pain    Therapy/Group: Individual Therapy  Patty Sermons 11/24/2021, 4:08 PM

## 2021-11-24 NOTE — Progress Notes (Addendum)
PROGRESS NOTE   Subjective/Complaints:  Pt up at EOB finishing breakfast. Had a good weekend. Wonders if she might be able to go home a bit earlier  ROS: Patient denies fever, rash, sore throat, blurred vision, dizziness, nausea, vomiting, diarrhea, cough, shortness of breath or chest pain, joint or back/neck pain, headache, or mood change.   Objective:   No results found. No results for input(s): "WBC", "HGB", "HCT", "PLT" in the last 72 hours.  No results for input(s): "NA", "K", "CL", "CO2", "GLUCOSE", "BUN", "CREATININE", "CALCIUM" in the last 72 hours.   Intake/Output Summary (Last 24 hours) at 11/24/2021 0754 Last data filed at 11/23/2021 2300 Gross per 24 hour  Intake 720 ml  Output --  Net 720 ml        Physical Exam: Vital Signs Blood pressure 138/86, pulse (!) 54, temperature 97.8 F (36.6 C), temperature source Oral, resp. rate 18, height 5' 4.02" (1.626 m), weight 74.6 kg, SpO2 100 %.  Constitutional: No distress . Vital signs reviewed. HEENT: NCAT, EOMI, oral membranes moist Neck: supple Cardiovascular: RRR without murmur. No JVD    Respiratory/Chest: CTA Bilaterally without wheezes or rales. Normal effort    GI/Abdomen: BS +, non-tender, non-distended Ext: no clubbing, cyanosis, or edema Psych: pleasant and cooperative  Skin: No evidence of breakdown, no evidence of rash Neurologic: very alert. More fluent language. Still apraxic with speech, motor on right. Improving insight. motor strength is 5/5 in Left deltoid, bicep, tricep, grip, hip flexor, knee extensors, ankle dorsiflexor and plantar flexor. RUE 3+/5 prox to 4/5 distal. RLE 4/5 prox to distal. Impaired LT RUE and RLE. Good sitting balance  Musculoskeletal: Full range of motion in all 4 extremities. No joint swelling   Assessment/Plan: 1. Functional deficits which require 3+ hours per day of interdisciplinary therapy in a comprehensive inpatient  rehab setting. Physiatrist is providing close team supervision and 24 hour management of active medical problems listed below. Physiatrist and rehab team continue to assess barriers to discharge/monitor patient progress toward functional and medical goals  Care Tool:  Bathing    Body parts bathed by patient: Right arm, Chest, Abdomen, Right upper leg, Left upper leg, Face, Front perineal area   Body parts bathed by helper: Left arm, Buttocks, Left lower leg, Right lower leg     Bathing assist Assist Level: Maximal Assistance - Patient 24 - 49%     Upper Body Dressing/Undressing Upper body dressing   What is the patient wearing?: Pull over shirt    Upper body assist Assist Level: Moderate Assistance - Patient 50 - 74%    Lower Body Dressing/Undressing Lower body dressing      What is the patient wearing?: Pants, Incontinence brief     Lower body assist Assist for lower body dressing: Maximal Assistance - Patient 25 - 49%     Toileting Toileting Toileting Activity did not occur (Clothing management and hygiene only): N/A (no void or bm)  Toileting assist Assist for toileting: Moderate Assistance - Patient 50 - 74%     Transfers Chair/bed transfer  Transfers assist     Chair/bed transfer assist level: Minimal Assistance - Patient > 75%  Locomotion Ambulation   Ambulation assist   Ambulation activity did not occur: Safety/medical concerns (required skilled intervention to participate safely in gait training)  Assist level: Minimal Assistance - Patient > 75% Assistive device: Walker-rolling Max distance: 150   Walk 10 feet activity   Assist  Walk 10 feet activity did not occur: Safety/medical concerns  Assist level: Minimal Assistance - Patient > 75% Assistive device: Walker-rolling   Walk 50 feet activity   Assist Walk 50 feet with 2 turns activity did not occur: Safety/medical concerns  Assist level: Minimal Assistance - Patient >  75% Assistive device: Walker-rolling    Walk 150 feet activity   Assist Walk 150 feet activity did not occur: Safety/medical concerns  Assist level: Minimal Assistance - Patient > 75% Assistive device: Walker-rolling    Walk 10 feet on uneven surface  activity   Assist Walk 10 feet on uneven surfaces activity did not occur: Safety/medical concerns         Wheelchair     Assist Is the patient using a wheelchair?: Yes Type of Wheelchair: Manual    Wheelchair assist level: Total Assistance - Patient < 25% (pt with poor ability to reach floor with foot for hemipropulsion) Max wheelchair distance: 1100ft    Wheelchair 50 feet with 2 turns activity    Assist        Assist Level: Total Assistance - Patient < 25%   Wheelchair 150 feet activity     Assist      Assist Level: Total Assistance - Patient < 25%   Blood pressure 138/86, pulse (!) 54, temperature 97.8 F (36.6 C), temperature source Oral, resp. rate 18, height 5' 4.02" (1.626 m), weight 74.6 kg, SpO2 100 %.  Medical Problem List and Plan: 1. 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 Told the patient that the team would discuss any change in her dc date tomorrow.    2. DVT/anticoagulation: SCDs             -antiplatelet therapy: N/A 3. Pain Management: Tylenol as needed  -occasionally has sensory-related discomfort on right d/t ICH 4. Mood/Sleep/ADHD: Ritalin 5mg  twice daily seems to be effective             -antipsychotic agents: N/A 5. Neuropsych/cognition: This patient is not yet capable of making decisions on her own behalf. 6. Skin/Wound Care: Routine skin checks 7. Fluids/Electrolytes/Nutrition: encouraging PO  -intake improved. Family bringing in food  -on regular diet  -re-check BMET/labs in AM 7/11 8.   Hypertension.  Avapro 300 mg daily, Bystolic 5 mg daily.      -can't push beta blocker any more d/t bradycardia  Controlled 7/10 Vitals:   11/23/21 1920 11/24/21 0314  BP: 124/69 138/86  Pulse: (!) 59 (!) 54  Resp: 18 18  Temp: 97.9 F (36.6 C) 97.8 F (36.6 C)  SpO2: 100% 100%    9.  Hyperlipidemia.  Lipitor 10. Vestibular 01/25/22 d/t ICH---better since initial episode last week  -gaze stabilization exercises provided by therapy  -  meclizine prn.      LOS: 16 days A FACE TO FACE EVALUATION WAS PERFORMED  VF:[id number] 11/24/2021, 7:54 AM

## 2021-11-25 DIAGNOSIS — R7989 Other specified abnormal findings of blood chemistry: Secondary | ICD-10-CM

## 2021-11-25 DIAGNOSIS — E785 Hyperlipidemia, unspecified: Secondary | ICD-10-CM

## 2021-11-25 LAB — BASIC METABOLIC PANEL
Anion gap: 7 (ref 5–15)
BUN: 10 mg/dL (ref 6–20)
CO2: 28 mmol/L (ref 22–32)
Calcium: 9.2 mg/dL (ref 8.9–10.3)
Chloride: 105 mmol/L (ref 98–111)
Creatinine, Ser: 0.98 mg/dL (ref 0.44–1.00)
GFR, Estimated: >60 mL/min (ref >60)
Glucose, Bld: 100 mg/dL — ABNORMAL HIGH (ref 70–99)
Potassium: 4 mmol/L (ref 3.5–5.1)
Sodium: 140 mmol/L (ref 135–145)

## 2021-11-25 LAB — CBC
HCT: 38.2 % (ref 36.0–46.0)
Hemoglobin: 12.8 g/dL (ref 12.0–15.0)
MCH: 30.7 pg (ref 26.0–34.0)
MCHC: 33.5 g/dL (ref 30.0–36.0)
MCV: 91.6 fL (ref 80.0–100.0)
Platelets: 298 10*3/uL (ref 150–400)
RBC: 4.17 MIL/uL (ref 3.87–5.11)
RDW: 13.2 % (ref 11.5–15.5)
WBC: 4.5 10*3/uL (ref 4.0–10.5)
nRBC: 0 % (ref 0.0–0.2)

## 2021-11-25 NOTE — Progress Notes (Signed)
Occupational Therapy Session Note  Patient Details  Name: Denise Herrera MRN: [id number] Date of Birth: [birth year]  Today's Date: 11/25/2021 OT Individual Time: 0505-0918 OT Individual Time Calculation (min): 73 min    Short Term Goals: Week 2:  OT Short Term Goal 1 (Week 2): Pt will complete UB/LB dressing with CGA. OT Short Term Goal 2 (Week 2): Pt will maintaing dynamic standing balance with CGA for increased independence with functional activties. OT Short Term Goal 3 (Week 2): Pt will demonstrate increased safety awareness to hemi-side. OT Short Term Goal 4 (Week 2): Pt will increase RUE shoulder ROM 10-15 degrees for increased indepdence in ADLs.  Skilled Therapeutic Interventions/Progress Updates:    Direct handoff from [facility] in the hallway. Completed functional mobility for 50 ft back to room. Dr. Marciano Sequin present in the room. Pt requesting to shower. Completed functional mobility into the shower and doffed all clothing with close (S) and showered with distant (S). Pt utilizing wash mit in the shower to encourage use of R hand in BADLs. Gathered all clothing and dressed seated/standing with (S) overall. Able to clasp bra strap and place overhead with CGA but needed some A to adjust in the back.Transported to day gym for time management. Using thera-putty and plastic utensils to simulate cutting and self-feeding to increase independence in ADLs. Practiced cutting, stabbing with the fork, and bringing it to her mouth. Red built up handle utilized to increase ease of grasp with R hand. Left with alarm on, call bell in reach, and all needs met.   Sa[place]o Stim One was placed on pt's dorsal forearm to improve wrist movement and attention to RUE. No c/o pain before or after tx or adverse skin reaction. 60 min unattended.  330 pulse width  35 Hz pulse rate  On 8 sec/ off 8 sec  Ramp up/ down 2 sec  Symmetrical Biphasic wave form  Max intensity 17m at 500 Ohm load   Therapy  Documentation Precautions:  Precautions Precautions: Fall Precaution Comments: R hemiparesis, R inattention, impaired awareness Restrictions Weight Bearing Restrictions: No  Therapy/Group: Individual Therapy  LCatalina Lunger7/03/2022, 10:22 AM

## 2021-11-25 NOTE — Progress Notes (Signed)
Speech Language Pathology Daily Session Note  Patient Details  Name: Denise Herrera MRN: [id number] Date of Birth: Jan 30, 1971  Today's Date: 11/25/2021 SLP Individual Time: 1345-1430 SLP Individual Time Calculation (min): 45 min  Short Term Goals: Week 3: SLP Short Term Goal 1 (Week 3): STG=LTG due to ELOS  Skilled Therapeutic Interventions: Skilled treatment session focused on language goals. SLP facilitated session with a functional conversation that focused on identifying current deficits and goals of care. Throughout conversation, patient with intermittent phonemic paraphasia that she was able to self-monitor and correct. Patient was mildly verbose and required Min verbal cues for topic maintenance. Patient participated in a structured language task with focus on word-finding and utilization of strategies. Patient required Min verbal cues throughout session for use of strategies and specificity of descriptions. Patient left upright in the wheelchair with alarm on and all needs within reach. Continue with current plan of care.      Pain No/Denies Pain   Therapy/Group: Individual Therapy  Brianna Esson 11/25/2021, 3:13 PM

## 2021-11-25 NOTE — Discharge Summary (Signed)
Physician Discharge Summary  Patient ID: Denise Herrera MRN: [id number] DOB/AGE: Sep 15, 1970 51 y.o.  Admit date: 11/08/2021 Discharge date: 11/29/2021  Discharge Diagnoses:  Principal Problem:   ICH (intracerebral hemorrhage) ([place]) Hypertension Hyperlipidemia Mood stabilization  Discharged Condition: Stable  Significant Diagnostic Studies: ECHOCARDIOGRAM COMPLETE  Result Date: 11/04/2021    ECHOCARDIOGRAM REPORT   Patient Name:   Denise Herrera Date of Exam: 11/04/2021 Medical Rec #:  [id number]      Height:       64.0 in Accession #:    [id number]     Weight:       168.9 lb Date of Birth:  17-May-1971      BSA:          1.821 m Patient Age:    39 years       BP:           147/111 mmHg Patient Gender: F              HR:           77 bpm. Exam Location:  Inpatient Procedure: 2D Echo, Cardiac Doppler and Color Doppler Indications:    Stroke I63.9  History:        Patient has no prior history of Echocardiogram examinations.                 Risk Factors:Hypertension. ICH.  Sonographer:    Roseanna Rainbow RDCS Referring Phys: [id number] [place] E [place]  Sonographer Comments: Technically difficult study due to poor echo windows. Poor sound wave transmission. Patient in restraints. IMPRESSIONS  1. Left ventricular ejection fraction, by estimation, is 65 to 70%. The left ventricle has normal function. The left ventricle has no regional wall motion abnormalities. There is moderate concentric left ventricular hypertrophy. Left ventricular diastolic parameters are consistent with Grade I diastolic dysfunction (impaired relaxation).  2. Right ventricular systolic function is normal. The right ventricular size is normal. Tricuspid regurgitation signal is inadequate for assessing PA pressure.  3. The pericardial effusion is posterior to the left ventricle.  4. The mitral valve is grossly normal. No evidence of mitral valve regurgitation. No evidence of mitral stenosis.  5. The aortic valve is tricuspid. Aortic valve  regurgitation is not visualized. No aortic stenosis is present.  6. There is mild dilatation of the ascending aorta,when indexed to age, gender, and BSA, measuring 37 mm. Comparison(s): No prior Echocardiogram. FINDINGS  Left Ventricle: Left ventricular ejection fraction, by estimation, is 65 to 70%. The left ventricle has normal function. The left ventricle has no regional wall motion abnormalities. The left ventricular internal cavity size was small. There is moderate  concentric left ventricular hypertrophy. Left ventricular diastolic parameters are consistent with Grade I diastolic dysfunction (impaired relaxation). Right Ventricle: The right ventricular size is normal. No increase in right ventricular wall thickness. Right ventricular systolic function is normal. Tricuspid regurgitation signal is inadequate for assessing PA pressure. Left Atrium: Left atrial size was normal in size. Right Atrium: Right atrial size was normal in size. Pericardium: Trivial pericardial effusion is present. The pericardial effusion is posterior to the left ventricle. Mitral Valve: The mitral valve is grossly normal. No evidence of mitral valve regurgitation. No evidence of mitral valve stenosis. Tricuspid Valve: The tricuspid valve is normal in structure. Tricuspid valve regurgitation is not demonstrated. No evidence of tricuspid stenosis. Aortic Valve: The aortic valve is tricuspid. Aortic valve regurgitation is not visualized. No aortic stenosis is present. Pulmonic Valve: The pulmonic valve  was grossly normal. Pulmonic valve regurgitation is not visualized. Aorta: The aortic root is normal in size and structure. There is mild dilatation of the ascending aorta, measuring 37 mm. Venous: The inferior vena cava was not well visualized. IAS/Shunts: No atrial level shunt detected by color flow Doppler.  LEFT VENTRICLE PLAX 2D LVIDd:         3.40 cm     Diastology LVIDs:         2.40 cm     LV e' medial:    3.70 cm/s LV PW:          1.30 cm     LV E/e' medial:  14.7 LV IVS:        1.35 cm     LV e' lateral:   7.29 cm/s LVOT diam:     2.20 cm     LV E/e' lateral: 7.5 LV SV:         98 LV SV Index:   54 LVOT Area:     3.80 cm  LV Volumes (MOD) LV vol d, MOD A2C: 77.4 ml LV vol d, MOD A4C: 62.0 ml LV vol s, MOD A2C: 18.1 ml LV vol s, MOD A4C: 21.5 ml LV SV MOD A2C:     59.3 ml LV SV MOD A4C:     62.0 ml LV SV MOD BP:      49.6 ml RIGHT VENTRICLE             IVC RV S prime:     12.40 cm/s  IVC diam: 0.80 cm TAPSE (M-mode): 1.9 cm LEFT ATRIUM             Index        RIGHT ATRIUM          Index LA diam:        2.20 cm 1.21 cm/m   RA Area:     7.94 cm LA Vol (A2C):   31.7 ml 17.41 ml/m  RA Volume:   11.70 ml 6.43 ml/m LA Vol (A4C):   16.0 ml 8.79 ml/m LA Biplane Vol: 23.9 ml 13.13 ml/m  AORTIC VALVE LVOT Vmax:   130.00 cm/s LVOT Vmean:  83.100 cm/s LVOT VTI:    0.257 m  AORTA Ao Root diam: 3.35 cm Ao Asc diam:  3.65 cm MITRAL VALVE MV Area (PHT): 2.64 cm    SHUNTS MV Decel Time: 287 msec    Systemic VTI:  0.26 m MV E velocity: 54.35 cm/s  Systemic Diam: 2.20 cm MV A velocity: 86.90 cm/s MV E/A ratio:  0.63 Rudean Haskell MD Electronically signed by Rudean Haskell MD Signature Date/Time: 11/04/2021/12:42:35 PM    Final    MR BRAIN WO CONTRAST  Result Date: 11/04/2021 CLINICAL DATA:  Stroke, follow up EXAM: MRI HEAD WITHOUT CONTRAST TECHNIQUE: Multiplanar, multiecho pulse sequences of the brain and surrounding structures were obtained without intravenous contrast. COMPARISON:  None Available. FINDINGS: Brain: Acute parenchymal hemorrhage is present within the left thalamus extending into the adjacent posterior internal capsule and superiorly into the corona radiata. There is mild surrounding edema. Mild mass effect is present. Intraventricular extension of hemorrhage is again noted. Limited evaluation for underlying lesion on this noncontrast study. Patchy foci of T2 hyperintensity in the supratentorial white matter nonspecific  but may reflect mild chronic microvascular ischemic changes. There is no evidence prior intracranial hemorrhage. Vascular: Major vessel flow voids at the skull base are preserved. Skull and upper cervical spine: Normal marrow signal is preserved.  Sinuses/Orbits: Paranasal sinuses are aerated. Orbits are unremarkable. Other: Sella is unremarkable.  Mastoid air cells are clear. IMPRESSION: Acute parenchymal hemorrhage with involvement of the left thalamus and adjacent white matter with intraventricular extension. Mild edema and mass effect. Limited evaluation for underlying lesion this noncontrast study. No evidence of prior hemorrhage. Electronically Signed   By: Macy Mis M.D.   On: 11/04/2021 10:39   CT HEAD WO CONTRAST (5MM)  Result Date: 11/04/2021 CLINICAL DATA:  Follow-up intraparenchymal hemorrhage on the left EXAM: CT HEAD WITHOUT CONTRAST TECHNIQUE: Contiguous axial images were obtained from the base of the skull through the vertex without intravenous contrast. RADIATION DOSE REDUCTION: This exam was performed according to the departmental dose-optimization program which includes automated exposure control, adjustment of the mA and/or kV according to patient size and/or use of iterative reconstruction technique. COMPARISON:  CT from the previous day. FINDINGS: Brain: There is again noted intraparenchymal hemorrhage involving left corona radiata and left thalamic capsular region with intraventricular extension identified. On the coronal imaging the maximum dimension is 3.8 cm increased from 3.2 cm on the prior exam. Some mass effect on the adjacent left lateral ventricle is noted. Intraventricular hemorrhage is seen. No significant increase in ventricular dilatation is noted when compared with the prior study. No new area focal hemorrhage is seen. Vascular: No hyperdense vessel or unexpected calcification. Skull: Normal. Negative for fracture or focal lesion. Sinuses/Orbits: No acute finding. Other:  None. IMPRESSION: Persistent left parenchymal hemorrhage as described which has increased in greatest dimension on coronal imaging from 3.2-3.8 cm. The some slight increase in mass effect upon the left lateral ventricle is noted although no significant midline shift is seen. No new hydrocephalus is seen. Electronically Signed   By: Inez Catalina M.D.   On: 11/04/2021 01:43   CT ANGIO HEAD NECK W WO CM (CODE STROKE)  Result Date: 11/03/2021 CLINICAL DATA:  Intracranial hemorrhage EXAM: CT ANGIOGRAPHY HEAD AND NECK TECHNIQUE: Multidetector CT imaging of the head and neck was performed using the standard protocol during bolus administration of intravenous contrast. Multiplanar CT image reconstructions and MIPs were obtained to evaluate the vascular anatomy. Carotid stenosis measurements (when applicable) are obtained utilizing NASCET criteria, using the distal internal carotid diameter as the denominator. RADIATION DOSE REDUCTION: This exam was performed according to the departmental dose-optimization program which includes automated exposure control, adjustment of the mA and/or kV according to patient size and/or use of iterative reconstruction technique. CONTRAST:  75 mL Omnipaque 350 COMPARISON:  None Available. FINDINGS: CTA NECK Aortic arch: Great vessel origins are patent. Right carotid system: Patent.  No stenosis. Left carotid system: Patent.  No stenosis. Vertebral arteries: Patent and codominant.  No stenosis. Skeleton: Cervical spine degenerative changes. Other neck: Unremarkable. Upper chest: No apical lung mass. Review of the MIP images confirms the above findings CTA HEAD Anterior circulation: Intracranial internal carotid arteries are patent. Anterior and middle cerebral arteries are patent. Posterior circulation: Intracranial vertebral arteries, basilar artery, and posterior cerebral arteries are patent. A right posterior communicating artery is present. Venous sinuses: Patent as allowed by contrast  bolus timing. There is no abnormal enhancement in the area of hemorrhage. Review of the MIP images confirms the above findings IMPRESSION: No aneurysm, AVM, or other significant vascular abnormality. Electronically Signed   By: Macy Mis M.D.   On: 11/03/2021 16:09   CT HEAD CODE STROKE WO CONTRAST  Result Date: 11/03/2021 CLINICAL DATA:  Code stroke. EXAM: CT HEAD WITHOUT CONTRAST TECHNIQUE: Contiguous axial images  were obtained from the base of the skull through the vertex without intravenous contrast. RADIATION DOSE REDUCTION: This exam was performed according to the departmental dose-optimization program which includes automated exposure control, adjustment of the mA and/or kV according to patient size and/or use of iterative reconstruction technique. COMPARISON:  None Available. FINDINGS: Brain: There is acute parenchymal hemorrhage involving the left corona radiata, caudate body, posterior internal capsule, and lateral thalamus with mild surrounding edema. Hemorrhage measures about 2.1 x 2.7 x 3.2 cm. There is extension into the left lateral ventricle. Mass effect is mild. No hydrocephalus. Gray-white differentiation is preserved. Vascular: No hyperdense vessel. Skull: Unremarkable. Sinuses/Orbits: Right posterior ethmoid and right sphenoid mucosal thickening. Orbits are unremarkable. Other: Mastoid air cells are clear. IMPRESSION: Acute parenchymal hemorrhage in the left corona radiata and thalamocapsular region with intraventricular extension. No significant mass effect or hydrocephalus. These results were communicated to Dr. Leonie Man at 3:52 pm on 11/03/2021 by text page via the [facility] messaging system. Electronically Signed   By: Macy Mis M.D.   On: 11/03/2021 15:55    Labs:  Basic Metabolic Panel: Recent Labs  Lab 11/25/21 0654  NA 140  K 4.0  CL 105  CO2 28  GLUCOSE 100*  BUN 10  CREATININE 0.98  CALCIUM 9.2    CBC: Recent Labs  Lab 11/25/21 0654  WBC 4.5  HGB 12.8  HCT  38.2  MCV 91.6  PLT 298    CBG: Recent Labs  Lab 11/21/21 1743  GLUCAP 87   Family history.  Mother with diabetes hypertension and breast cancer.  Paternal grandfather with diabetes.  Denies any colon cancer esophageal cancer or rectal cancer  Brief HPI:   Denise Herrera is a 51 y.o. right-handed female with history of ADHD maintained on Ritalin migraine headaches hypertension and hyperlipidemia.  Per chart review lives with spouse.  Independent prior to admission working full-time as a Freight forwarder at [employer].  Presented 11/05/2021 with acute onset of right-sided weakness and aphasia.  Blood pressure mildly elevated 141/85.  CT of the head showed acute parenchymal hemorrhage in the left corona radiata with intraventricular extension.  No significant mass effect or hydrocephalus.  CT angiogram head and neck no aneurysm or AVM.  MRI follow-up 11/04/2021 again showing parenchymal hemorrhage of the left thalamus and adjacent white matter with intraventricular extension.  Mild mass effect and edema.  Neurology follow-up conservative care close monitoring of blood pressure she was weaned from Cleviprex.  Echocardiogram with ejection fraction 65 to 70% no wall motion abnormalities grade 1 diastolic dysfunction.  Therapy evaluations completed due to patient's right side weakness decreased functional mobility was admitted for a comprehensive rehab program.   Hospital Course: Denise Herrera was admitted to rehab 11/08/2021 for inpatient therapies to consist of PT, ST and OT at least three hours five days a week. Past admission physiatrist, therapy team and rehab RN have worked together to provide customized collaborative inpatient rehab pertaining to patient's left thalamic ICH conservative care per neurology services close monitoring of blood pressure.  Patient remained on Avapro as well as Bystolic..  Mood stabilization with history of ADHD Ritalin as directed patient had remained on prior to  admission.  Lipitor for hyperlipidemia.  Patient did have some vestibular symptoms central related to [place] responding well to meclizine as needed.   Blood pressures were monitored on TID basis and controlled     Rehab course: During patient's stay in rehab weekly team conferences were held to monitor  patient's progress, set goals and discuss barriers to discharge. At admission, patient required max assist 3 feet 2 person hand-held assist moderate assist supine to sit  Physical exam.  Blood pressure 149/83 pulse 71 temperature 97.4 respirations 19 oxygen saturation 96% room air Constitutional.  No acute distress HEENT.  Left gaze preference Eyes.  Pupils round and reactive to light no discharge.nystagmus Neck.  Supple nontender no JVD without thyromegaly Cardiac regular rate and rhythm without any extra sounds or murmur heard Abdomen.  Soft nontender positive bowel sounds without rebound Respiratory effort normal no respiratory distress without wheeze Extremities.  No evidence of clubbing cyanosis or edema Neurologic.  Cranial nerves II through XII intact.  Right neglect.  Left upper left lower extremity 5/5 no drift.  Bilaterally right upper extremity proximally 0/5 bicep 2/5 hand grip 3/5 right lower extremity 0/5 with increased muscle tone.  Sensation diminished right upper right lower extremity.  He/She  has had improvement in activity tolerance, balance, postural control as well as ability to compensate for deficits. He/She has had improvement in functional use RUE/LUE  and RLE/LLE as well as improvement in awareness.  Patient ambulating 245 feet using rolling walker right hand splint with AFO contact-guard.  Provided some verbal cues for increased right hamstring activation.  Patient stood edge of bed rolling walker contact-guard completed functional mobility to the shower contact-guard.  Able to doff clothing with supervision while seated in the shower and utilize correct Hemi technique  without cueing.  Able to shower with supervision.  Stand pivot transfer with grab bar to wheelchair and transported the sink for oral care and hygiene.  Patient with excellent gains with speech therapy has met 6 of 6 short-term goals improved expressive receptive communication skills higher-level word finding reading accuracy and comprehension as well as air awareness and sustained attention.  Full family teaching completed plan discharged to home       Disposition: Discharge to home    Diet: Regular  Special Instructions: No driving smoking or alcohol  Medications at discharge 1.  Tylenol as needed 2.  Lipitor 20 mg p.o. daily 3.  Avapro 300 mg p.o. daily 4.  Ritalin 5 mg p.o. twice daily 5.  Bystolic 5 mg p.o. daily 6.  Protonix 40 mg p.o. daily 7.  Antivert 12.5 mg 3 times daily as needed 8.  Zyrtec 10 mg daily 9.  Relpax 40 mg daily as needed migraine headaches 10.  Nasacort 2 sprays into the nostril daily as needed  30-35 minutes were spent completing discharge summary and discharge planning  Discharge Instructions     Ambulatory referral to Neurology   Complete by: As directed    An appointment is requested in approximately: 4 weeks [place]   Ambulatory referral to Occupational Therapy   Complete by: As directed    Eval and treat   Ambulatory referral to Physical Medicine Rehab   Complete by: As directed    Moderate complexity follow up 1-2 weeks [place]   Ambulatory referral to Physical Therapy   Complete by: As directed    Eval and treat   Ambulatory referral to Speech Therapy   Complete by: As directed    Eval and treat        Follow-up Information     Meredith Staggers, MD Follow up.   Specialty: Physical Medicine and Rehabilitation Why: Office to call for appointment Contact information: [address] [place] [address] [phone]  Signed: Lavon Paganini Amira Podolak 11/28/2021, 5:36 AM

## 2021-11-25 NOTE — Patient Care Conference (Signed)
Inpatient RehabilitationTeam Conference and Plan of Care Update Date: 11/25/2021   Time: 10:01 AM    Patient Name: Denise Herrera      Medical Record Number: [id number]  Date of Birth: 1970/10/06 Sex: Female         Room/Bed: 4W02C/4W02C-01 Payor Info: Payor: BLUE CROSS BLUE SHIELD / Plan: BCBS OTHER / Product Type: *No Product type* /    Admit Date/Time:  11/08/2021  4:32 PM  Primary Diagnosis:  ICH (intracerebral hemorrhage) [facility])  Hospital Problems: Principal Problem:   ICH (intracerebral hemorrhage) ([facility])    Expected Discharge Date: Expected Discharge Date: 11/29/21  Team Members Present: Physician leading conference: Dr. Fanny Dance Social Worker Present: Cecile Sheerer, LCSWA Nurse Present: Kennyth Arnold, RN PT Present: Serina Cowper, PT OT Present: Jake Shark, OT SLP Present: Feliberto Gottron, SLP PPS Coordinator present : Fae Pippin, SLP     Current Status/Progress Goal Weekly Team Focus  Bowel/Bladder   continent b/b, lbm 7/10  remain continent  toilet as needed   Swallow/Nutrition/ Hydration             ADL's   UB/LB dressing and bathing are goal level at (S), still requiring cueing for awareness to R side, limited forearm/wrist movement  CGA/S  dynamic standing balance, bilateral integration of UEs, safety awareness, grasp, strength/endurance   Mobility   Supervision bed mobility and transfers, CGA gait >200 ft using RW R hand splint and PLS AFO, min A 4 steps with 1 rail  CGA-supervision overall, gait 150 ft, 12 steps  activity tolerlance, balance, functional mobility, gait and stair training, R hemi-body attention and NMR, safety awareness, patient/caregiver education   Communication   sup-to-min A - pt is making excellent progress  mod I expression for speech intelligiblity, min A for word finding, min A for comprehension  high level word finding, awareness of errors, writing/reading tasks, texting/emailing per pt wishes   Safety/Cognition/  Behavioral Observations  sup-to-min A  mod I orientation, sup A attention and emergent awareness, min A memory  awareness, attention, functional recall with use of compensations   Pain   no report of pain  < 3  assess pain q 4hr and prn   Skin   CDI  maintain skin intergrity  assess skin q shift and prn     Discharge Planning:  Pt will d/c to home with support from son who lives in the home, and can assist during the day while her husband is at work; Spouse works 4p-12 am.   Team Discussion: Doing well. Labs good. BP good. Continent B/B, no reports of pain. Family education to be scheduled. Planned community outing on Thursday. Safety awareness is challenging. Will need RW and Outpatient therapy.  Patient on target to meet rehab goals: yes, supervision goals. Currently at goal level for OT. CGA ambulating, supervision transfers, working on stairs. Patient does require cueing for the right side neglect. Needs cueing to don right AFO. Working on Systems developer, word finding.  *See Care Plan and progress notes for long and short-term goals.   Revisions to Treatment Plan:  None at this time.   Teaching Needs: Family education, medication management, safety awareness, transfer/gait training, etc.   Current Barriers to Discharge: No current barriers  Possible Resolutions to Barriers: All barriers addressed     Medical Summary Current Status: HTN, ADHD,HLD, Vestibular Sx  Barriers to Discharge: Home enviroment access/layout;Medical stability  Barriers to Discharge Comments: HTN, ADHD,HLD, Vestibular Sx Possible Resolutions to [employer] Focus: Monitor labs  and fluid intake, follow BP   Continued Need for Acute Rehabilitation Level of Care: The patient requires daily medical management by a physician with specialized training in physical medicine and rehabilitation for the following reasons: Direction of a multidisciplinary physical rehabilitation program to maximize  functional independence : Yes Medical management of patient stability for increased activity during participation in an intensive rehabilitation regime.: Yes Analysis of laboratory values and/or radiology reports with any subsequent need for medication adjustment and/or medical intervention. : Yes   I attest that I was present, lead the team conference, and concur with the assessment and plan of the team.   Kennyth Arnold G 11/25/2021, 1:00 PM

## 2021-11-25 NOTE — Progress Notes (Signed)
PROGRESS NOTE   Subjective/Complaints:  No new concerns or complaints this AM. She is looking forward to going home in a few days.   ROS: Patient denies fever, rash, sore throat, blurred vision, double vision, dizziness, nausea, vomiting, diarrhea, cough, shortness of breath or chest pain, joint or back/neck pain, headache, or mood change.   Objective:   No results found. Recent Labs    11/25/21 0654  WBC 4.5  HGB 12.8  HCT 38.2  PLT 298    No results for input(s): "NA", "K", "CL", "CO2", "GLUCOSE", "BUN", "CREATININE", "CALCIUM" in the last 72 hours.   Intake/Output Summary (Last 24 hours) at 11/25/2021 0744 Last data filed at 11/24/2021 1800 Gross per 24 hour  Intake 1020 ml  Output --  Net 1020 ml         Physical Exam: Vital Signs Blood pressure 137/82, pulse 64, temperature 98.3 F (36.8 C), temperature source Oral, resp. rate 17, height 5' 4.02" (1.626 m), weight 74.6 kg, SpO2 100 %.  Constitutional: No distress . Vital signs reviewed. Working with therapy in room. HEENT: NCAT, EOMI, oral membranes moist Neck: supple Cardiovascular: RRR without murmur. No JVD    Respiratory/Chest: CTA Bilaterally without wheezes or rales. Good air movement GI/Abdomen: BS +, non-tender, non-distended Ext: no clubbing, cyanosis, or edema Psych: pleasant and cooperative  Skin: No evidence of breakdown, no evidence of rash Neurologic: very alert. More fluent language. Still apraxic with speech, motor on right. Improving insight. motor strength is 5/5 in Left deltoid, bicep, tricep, grip, hip flexor, knee extensors, ankle dorsiflexor and plantar flexor. RUE 3+/5 prox to 4/5 distal. RLE 4/5 prox to distal. Impaired LT RUE and RLE. Good sitting balance  Musculoskeletal: Full range of motion in all 4 extremities. No joint swelling   Assessment/Plan: 1. Functional deficits which require 3+ hours per day of interdisciplinary  therapy in a comprehensive inpatient rehab setting. Physiatrist is providing close team supervision and 24 hour management of active medical problems listed below. Physiatrist and rehab team continue to assess barriers to discharge/monitor patient progress toward functional and medical goals  Care Tool:  Bathing    Body parts bathed by patient: Right arm, Chest, Abdomen, Right upper leg, Left upper leg, Face, Front perineal area   Body parts bathed by helper: Left arm, Buttocks, Left lower leg, Right lower leg     Bathing assist Assist Level: Maximal Assistance - Patient 24 - 49%     Upper Body Dressing/Undressing Upper body dressing   What is the patient wearing?: Pull over shirt    Upper body assist Assist Level: Moderate Assistance - Patient 50 - 74%    Lower Body Dressing/Undressing Lower body dressing      What is the patient wearing?: Pants, Incontinence brief     Lower body assist Assist for lower body dressing: Maximal Assistance - Patient 25 - 49%     Toileting Toileting Toileting Activity did not occur (Clothing management and hygiene only): N/A (no void or bm)  Toileting assist Assist for toileting: Moderate Assistance - Patient 50 - 74%     Transfers Chair/bed transfer  Transfers assist     Chair/bed transfer assist level:  Minimal Assistance - Patient > 75%     Locomotion Ambulation   Ambulation assist   Ambulation activity did not occur: Safety/medical concerns (required skilled intervention to participate safely in gait training)  Assist level: Minimal Assistance - Patient > 75% Assistive device: Walker-rolling Max distance: 150   Walk 10 feet activity   Assist  Walk 10 feet activity did not occur: Safety/medical concerns  Assist level: Minimal Assistance - Patient > 75% Assistive device: Walker-rolling   Walk 50 feet activity   Assist Walk 50 feet with 2 turns activity did not occur: Safety/medical concerns  Assist level: Minimal  Assistance - Patient > 75% Assistive device: Walker-rolling    Walk 150 feet activity   Assist Walk 150 feet activity did not occur: Safety/medical concerns  Assist level: Minimal Assistance - Patient > 75% Assistive device: Walker-rolling    Walk 10 feet on uneven surface  activity   Assist Walk 10 feet on uneven surfaces activity did not occur: Safety/medical concerns         Wheelchair     Assist Is the patient using a wheelchair?: Yes Type of Wheelchair: Manual    Wheelchair assist level: Total Assistance - Patient < 25% (pt with poor ability to reach floor with foot for hemipropulsion) Max wheelchair distance: 147ft    Wheelchair 50 feet with 2 turns activity    Assist        Assist Level: Total Assistance - Patient < 25%   Wheelchair 150 feet activity     Assist      Assist Level: Total Assistance - Patient < 25%   Blood pressure 137/82, pulse 64, temperature 98.3 F (36.8 C), temperature source Oral, resp. rate 17, height 5' 4.02" (1.626 m), weight 74.6 kg, SpO2 100 %.  Medical Problem List and Plan: 1. 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 Told the patient that the team would discuss any change in her dc date tomorrow.     -Team Conference today 2. DVT/anticoagulation: SCDs             -antiplatelet therapy: N/A 3. Pain Management: Tylenol as needed  -occasionally has sensory-related discomfort on right d/t ICH 4. Mood/Sleep/ADHD: Ritalin 5mg  twice daily seems to be effective             -antipsychotic agents: N/A 5. Neuropsych/cognition: This patient is not yet capable of making decisions on her own behalf. 6. Skin/Wound Care: Routine skin checks 7. Fluids/Electrolytes/Nutrition: encouraging PO  -intake improved. Family bringing in food  -on regular  diet  -BMET today stable, BUN and Scr improved 8.  Hypertension.  Avapro 300 mg daily, Bystolic 5 mg daily.      -can't push beta blocker any more d/t bradycardia  Controlled 7/11 Vitals:   11/24/21 1950 11/25/21 0553  BP: 139/82 137/82  Pulse: (!) 54 64  Resp: 17 17  Temp: 98.4 F (36.9 C) 98.3 F (36.8 C)  SpO2: 99% 100%    9.  Hyperlipidemia.  Lipitor, encourage healthy diet 10. Vestibular 01/26/22 d/t ICH---better since initial episode last week  -gaze stabilization exercises provided by therapy  -  meclizine prn.      LOS: 17 days A FACE TO FACE EVALUATION  WAS PERFORMED  Fanny Dance 11/25/2021, 7:44 AM

## 2021-11-25 NOTE — Progress Notes (Signed)
Patient ID: Denise Herrera, female   DOB: 07/25/1970, 51 y.o.   MRN: [id number]   SW met with pt in room to provide updates from team conference, and called pt husband Denise Herrera while in room. SW informed d/c date changed form 7/18 to 7/15 due to gains made in rehab. SW will order RW and 3in1 BSC as unsure if they have BSC in storage. Fam edu scheduled for Friday (7/14) 9am-12pm with pt husband and son. Preferred outpatient location [facility] ([phone]/f:[phone]).  SW ordered DME: RW and 3in1 BSC with [employer] via parachute.   Loralee Pacas, MSW, [place] Office: [phone] Cell: [phone] Fax: [phone]

## 2021-11-25 NOTE — Progress Notes (Signed)
Physical Therapy Weekly Progress Note  Patient Details  Name: Denise Herrera MRN: [id number] Date of Birth: [birth year]  Beginning of progress report period: November 16, 2021 End of progress report period: November 25, 2021  Today's Date: 11/25/2021 PT Individual Time: 0277-4128 and 7867-6720 PT Individual Time Calculation (min): 45 min and 30 min  Patient has met 3 of 3 short term goals.  Patient has made steady progress this week progressing to mod I for bed mobility, supervision for transfers, CGA gait up to 245 feet using a RW, R hand splint, and R PLS AFO, and CGA for 4 steps with 1 rail. Plan for family education this week and to move d/c to the end of this week due to patient' progress.   Patient continues to demonstrate the following deficits decreased cardiorespiratoy endurance, decreased coordination and decreased motor planning, decreased midline orientation and decreased attention to right, decreased attention, decreased awareness, decreased problem solving, decreased safety awareness, and decreased memory, central origin, and decreased sitting balance, decreased standing balance, decreased postural control, hemiplegia, and decreased balance strategies and therefore will continue to benefit from skilled PT intervention to increase functional independence with mobility.  Patient progressing toward long term goals..  Plan of care revisions: moved d/c date to Saturday 7/15 due to patient's progress.  PT Short Term Goals Week 2:  PT Short Term Goal 1 (Week 2): Patient will perform basic transfers with CGA consistently using LRAD. PT Short Term Goal 1 - Progress (Week 2): Met PT Short Term Goal 2 (Week 2): Patient will ambulate >150 feet with min A using LRAD. PT Short Term Goal 2 - Progress (Week 2): Met PT Short Term Goal 3 (Week 2): Patient will perform standing balance with CGA >2 min. PT Short Term Goal 3 - Progress (Week 2): Met Week 3:  PT Short Term Goal 1 (Week 3): STG=LTG due to  ELOS.  Skilled Therapeutic Interventions/Progress Updates:     Session 1: Patient sitting EOB upon PT arrival. Patient alert and agreeable to PT session. Patient denied pain during session.  Patient requested to use the restroom at beginning of session. Focused remainder of session on Outcome Measure assessments for weekly assessment to determine how present deficits impact patient's fall risk level and endurance with functional mobility.   Therapeutic Activity: Transfers: Patient performed sit to/from stand from the bed, toilet, and mat table with supervision using RW with R hand splint. Provided verbal cues for R hand placement in R hand splint and pushing up and reaching back to sit for safety. Patient performed an ambulatory transfer to/from the bathroom with CGA using RW with R hand splint in non-skid socks without AFO. Required cues for attention to R hand placement and R foot clearance for safety. Patient was continent of bowel and bladder, performed peri-care and lower body clothing dressing independently with supervision for standing balance.  Five times Sit to Stand Test (FTSS) Method: Use a straight back chair with a solid seat that is 17-18" high. Ask participant to sit on the chair with arms folded across their chest.   Instructions: "Stand up and sit down as quickly as possible 5 times, keeping your arms folded across your chest."   Measurement: Stop timing when the participant touches the chair in sitting the 5th time.  TIME: 22 sec Cut off scores indicative of increased fall risk: >12 sec CVA (ANPTA Core Set of Outcome Measures for Adults with Neurologic Conditions, 2018)  Gait Training:  Patient ambulated 195 feet using  RW with R hand splint with R PLS AFO and tennis shoes (donned with min-mod A on R and supervision on L) with CGA. Ambulated with decreased gait speed, decreased step length and height on R, decreased R weight shift and stance time, forward trunk lean, and  downward head gaze. Patient did not require assist with AD management this session. Provided verbal cues for increased R hamstring activation in pre-swing and stance for increased foot clearance in swiing,  cued patient for looking ahead with reduced foot clearance without visual feedback, and increased lateral hip activation in stance for improved stability. 6 Min Walk Test:  Instructed patient to ambulate as quickly and as safely as possible for 6 minutes using LRAD. Patient was allowed to take standing rest breaks without stopping the test, but if the patient required a sitting rest break the clock would be stopped and the test would be over.  Results: 195 feet (59.4 meters, Avg speed 0.2 m/s) using a RW with R hand splint and R AFO with CGA. Results indicate that the patient has reduced endurance with ambulation compared to age matched norms.  Age Matched Norms: 76-69 yo F: 538 meters MDC: 58.21 meters (190.98 feet) or 50 meters (ANPTA Core Set of Outcome Measures for Adults with Neurologic Conditions, 2018)  Patient handed off to OT and OTS in the hallway at end of session.   Session 2: [name] in the room upon PT arrival. Patient alert and agreeable to PT session. Patient denied pain during session. Requested to go outside for therapeutic affect. Patient transported outside to perform balance assessment during session.  Neuromuscular Re-ed: Berg Balance Test Sit to Stand: Able to stand without using hands and stabilize independently Standing Unsupported: Able to stand safely 2 minutes Sitting with Back Unsupported but Feet Supported on Floor or Stool: Able to sit safely and securely 2 minutes Stand to Sit: Sits safely with minimal use of hands Transfers: Able to transfer with verbal cueing and /or supervision Standing Unsupported with Eyes Closed: Able to stand 10 seconds with supervision Standing Ubsupported with Feet Together: Needs help to attain position but able to stand for 30  seconds with feet together From Standing, Reach Forward with Outstretched Arm: Can reach forward >12 cm safely (5") From Standing Position, Pick up Object from Floor: Able to pick up shoe, needs supervision From Standing Position, Turn to Look Behind Over each Shoulder: Looks behind one side only/other side shows less weight shift Turn 360 Degrees: Needs assistance while turning Standing Unsupported, Alternately Place Feet on Step/Stool: Needs assistance to keep from falling or unable to try Standing Unsupported, One Foot in Front: Able to take small step independently and hold 30 seconds Standing on One Leg: Unable to try or needs assist to prevent fall Total Score: 33/56 Patient demonstrated increased fall risk noted by score of 33/56 on the Berg Balance Scale.  <45/56 = fall risk, <42/56 = predictive of recurrent falls, <40/56 = 100% fall risk  >41 = independent, 21-40 = assistive device, 0-20 = wheelchair level  MDC 6.9 (4 pts 45-56, 5 pts 35-44, 7 pts 25-34) (ANPTA Core Set of Outcome Measures for Adults with Neurologic Conditions, 2018)  Educated patient on score and interpretation and patient's progress during stay. Recommending use of AD's at this time due to increased fall risk, patient in agreement.   [name] in the room at end of session with breaks locked, seat belt alarm set, and all needs within reach.  Therapy Documentation Precautions:  Precautions Precautions: Fall Precaution Comments: R hemiparesis, R inattention, impaired awareness Restrictions Weight Bearing Restrictions: No   Therapy/Group: Individual Therapy  Leena Tiede L Nathalia Wismer PT, DPT, NCS, CBIS  11/25/2021, 5:58 AM

## 2021-11-26 MED ORDER — BUTALBITAL-APAP-CAFFEINE 50-325-40 MG PO TABS
1.0000 | ORAL_TABLET | Freq: Four times a day (QID) | ORAL | Status: DC | PRN
Start: 2021-11-26 — End: 2021-11-29

## 2021-11-26 MED ORDER — OXYCODONE HCL 5 MG PO TABS
5.0000 mg | ORAL_TABLET | Freq: Once | ORAL | Status: DC
  Filled 2021-11-26: qty 1

## 2021-11-26 NOTE — Progress Notes (Signed)
PROGRESS NOTE   Subjective/Complaints:  She had a migraine yesterday. She gets these about 2x a month. Her usual migraine medication was not available but this improved with tylenol.   ROS: Patient denies fever, rash, sore throat, blurred vision, double vision, dizziness, nausea, vomiting, diarrhea, cough, shortness of breath or chest pain, joint or back/neck pain, or mood change.  + headache-improved  Objective:   No results found. Recent Labs    11/25/21 0654  WBC 4.5  HGB 12.8  HCT 38.2  PLT 298     Recent Labs    11/25/21 0654  NA 140  K 4.0  CL 105  CO2 28  GLUCOSE 100*  BUN 10  CREATININE 0.98  CALCIUM 9.2     Intake/Output Summary (Last 24 hours) at 11/26/2021 1130 Last data filed at 11/26/2021 0749 Gross per 24 hour  Intake 720 ml  Output --  Net 720 ml         Physical Exam: Vital Signs Blood pressure (!) 156/99, pulse (!) 51, temperature 97.9 F (36.6 C), temperature source Oral, resp. rate 17, height 5' 4.02" (1.626 m), weight 74.6 kg, SpO2 100 %.  Constitutional: No distress . Vital signs reviewed. Working with therapy in room. HEENT: NCAT, EOMI, oral membranes moist Neck: supple Cardiovascular: RRR without murmur. No JVD    Respiratory/Chest: CTA Bilaterally without wheezes or rales. Good air movement GI/Abdomen: BS +, non-tender, non-distended,soft Ext: no clubbing, cyanosis, or edema Psych: pleasant and cooperative , jovial  Skin: No evidence of breakdown, no evidence of rash Neurologic: very alert. More fluent language. Still apraxic with speech, motor on right. Improving insight. motor strength is 5/5 in Left deltoid, bicep, tricep, grip, hip flexor, knee extensors, ankle dorsiflexor and plantar flexor. RUE 3+/5 prox to 4/5 distal. RLE 4/5 prox to distal. Impaired LT RUE and RLE. Good sitting balance  Musculoskeletal: Full range of motion in all 4 extremities. No joint  swelling   Assessment/Plan: 1. Functional deficits which require 3+ hours per day of interdisciplinary therapy in a comprehensive inpatient rehab setting. Physiatrist is providing close team supervision and 24 hour management of active medical problems listed below. Physiatrist and rehab team continue to assess barriers to discharge/monitor patient progress toward functional and medical goals  Care Tool:  Bathing    Body parts bathed by patient: Right arm, Chest, Abdomen, Right upper leg, Left upper leg, Face, Front perineal area   Body parts bathed by helper: Left arm, Buttocks, Left lower leg, Right lower leg     Bathing assist Assist Level: Maximal Assistance - Patient 24 - 49%     Upper Body Dressing/Undressing Upper body dressing   What is the patient wearing?: Pull over shirt    Upper body assist Assist Level: Moderate Assistance - Patient 50 - 74%    Lower Body Dressing/Undressing Lower body dressing      What is the patient wearing?: Pants, Incontinence brief     Lower body assist Assist for lower body dressing: Maximal Assistance - Patient 25 - 49%     Toileting Toileting Toileting Activity did not occur (Clothing management and hygiene only): N/A (no void or bm)  Toileting assist  Assist for toileting: Moderate Assistance - Patient 50 - 74%     Transfers Chair/bed transfer  Transfers assist     Chair/bed transfer assist level: Minimal Assistance - Patient > 75%     Locomotion Ambulation   Ambulation assist   Ambulation activity did not occur: Safety/medical concerns (required skilled intervention to participate safely in gait training)  Assist level: Minimal Assistance - Patient > 75% Assistive device: Walker-rolling Max distance: 150   Walk 10 feet activity   Assist  Walk 10 feet activity did not occur: Safety/medical concerns  Assist level: Minimal Assistance - Patient > 75% Assistive device: Walker-rolling   Walk 50 feet  activity   Assist Walk 50 feet with 2 turns activity did not occur: Safety/medical concerns  Assist level: Minimal Assistance - Patient > 75% Assistive device: Walker-rolling    Walk 150 feet activity   Assist Walk 150 feet activity did not occur: Safety/medical concerns  Assist level: Minimal Assistance - Patient > 75% Assistive device: Walker-rolling    Walk 10 feet on uneven surface  activity   Assist Walk 10 feet on uneven surfaces activity did not occur: Safety/medical concerns         Wheelchair     Assist Is the patient using a wheelchair?: Yes Type of Wheelchair: Manual    Wheelchair assist level: Total Assistance - Patient < 25% (pt with poor ability to reach floor with foot for hemipropulsion) Max wheelchair distance: 111ft    Wheelchair 50 feet with 2 turns activity    Assist        Assist Level: Total Assistance - Patient < 25%   Wheelchair 150 feet activity     Assist      Assist Level: Total Assistance - Patient < 25%   Blood pressure (!) 156/99, pulse (!) 51, temperature 97.9 F (36.6 C), temperature source Oral, resp. rate 17, height 5' 4.02" (1.626 m), weight 74.6 kg, SpO2 100 %.  Medical Problem List and Plan: 1. 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 Told the patient that the team would discuss any change in her dc date tomorrow.    2. DVT/anticoagulation: SCDs             -antiplatelet therapy: N/A 3. Pain Management: Tylenol as needed  -occasionally has sensory-related discomfort on right d/t ICH 4. Mood/Sleep/ADHD: Ritalin 5mg  twice daily seems to be effective             -antipsychotic agents: N/A 5. Neuropsych/cognition: This patient is not yet capable of making decisions on her own behalf. 6. Skin/Wound Care: Routine skin checks 7.  Fluids/Electrolytes/Nutrition: encouraging PO  -intake improved. Family bringing in food  -on regular diet  -BMET 7/11 was stable, BUN and Scr improved, continue to encourage fluid intake 8.  Hypertension.  Avapro 300 mg daily, Bystolic 5 mg daily.      -can't push beta blocker any more d/t bradycardia  - Has been intermittently elevated last night, possibly related to her HA, continue to monitor Vitals:   11/25/21 1933 11/26/21 0309  BP: (!) 147/88 (!) 156/99  Pulse: 60 (!) 51  Resp: 17 17  Temp: 98 F (36.7 C) 97.9 F (36.6 C)  SpO2: 98%  100%    9.  Hyperlipidemia.  Lipitor, encourage healthy diet 10. Vestibular [id number] d/t ICH---better since initial episode last week  -gaze stabilization exercises provided by therapy  -  meclizine prn.  11. Headache-resolved  -Continue tylenol PRN     LOS: 18 days A FACE TO FACE EVALUATION WAS PERFORMED  Jennye Boroughs 11/26/2021, 11:30 AM

## 2021-11-26 NOTE — Progress Notes (Signed)
Occupational Therapy Weekly Progress Note  Patient Details  Name: TANAYIA WAHLQUIST MRN: [id number] Date of Birth: 04-Sep-1970  Beginning of progress report period: November 18, 2021 End of progress report period: November 26, 2021  Today's Date: 11/26/2021 OT Individual Time: 0945-1100 OT Individual Time Calculation (min): 75 min    Patient has met 4 of 4 short term goals.  Allia has made wonderful progress in CIR. She is at goal level overall with ADLs including bathing and dressing. RUE ROM, strength, and grasp have made significant improvements which have increased pts independence in ADLs.  Pt d/c is now 11/29/21 d/t progress made in therapy. Will continue to benefit from OT as seen below.  Patient continues to demonstrate the following deficits: muscle weakness and muscle paralysis, decreased cardiorespiratoy endurance, impaired timing and sequencing and decreased coordination, decreased attention, decreased awareness, decreased problem solving, and decreased safety awareness, and decreased postural control, hemiplegia, and decreased balance strategies and therefore will continue to benefit from skilled OT intervention to enhance overall performance with .  Patient progressing toward long term goals..  Continue plan of care.  OT Short Term Goals Week 3:  OT Short Term Goal 1 (Week 3): STG = LTG  Skilled Therapeutic Interventions/Progress Updates:    Pt received sitting in w/c and agreeable to OT session. Pt showered with distant supervision. Close supervision to stand to bathe back/LB bathing. Pt gathered all clothing items and dressed sitting in w/c. Pt transported to ADL bathroom for time management. Simulated home environment and practiced getting into and out of the tub shower using a TTB. Pt able to transfer in and out with S. Pt with increased awareness to R side throughout session. Completed box and blocks assessment for the second time. Scores are listed below. Pt with significant improvement  from the first time with BUE. First scores were LUE 25 and RUE 7. Second scores are LUE 64 and RUE 25. Results demonstrate improved grasp and control of BUE. Finished the session by using R hand to grasp 12 red checker blocks and inserting them onto the Velcro board. Pt took them off as well. Activity addressing Hitchcock and shoulder flexion/extension. Transported back to room. Pt left with alarm on, call bell in reach, and all needs met.  Therapy Documentation Precautions:  Precautions Precautions: Fall Precaution Comments: R hemiparesis, R inattention, impaired awareness Restrictions Weight Bearing Restrictions: No  Therapy/Group:   Catalina Lunger 11/26/2021, 7:04 AM

## 2021-11-26 NOTE — Progress Notes (Addendum)
Physical Therapy Session Note  Patient Details  Name: Denise Herrera MRN: [id number] Date of Birth: 1970-12-16  Today's Date: 11/26/2021 PT Individual Time: 1335-1445 PT Individual Time Calculation (min): 70 min   Short Term Goals: Week 3:  PT Short Term Goal 1 (Week 3): STG=LTG due to ELOS.  Skilled Therapeutic Interventions/Progress Updates:     [name] in the room upon PT arrival. Patient alert and agreeable to PT session. Patient denied pain during session.  [facility], from [facility] present for beginning of session for AFO consult. Determined use of PLS AFO with partial build up for heel lift and toe cap to R shoe during consult. PA made aware of recommendations.   Therapeutic Activity: Transfers: Patient performed sit to/from stand x11 with supervision with and without AD. Provided verbal cues for attention to R foot placement, forward weight shift, and controlled descent for safety.  Gait Training/AFO assessment:  Patient ambulated >150 feet using RW with R hand splint and R PLS AFO with w/c with CGA. Ambulated with decreased gait speed, decreased step length and height on R, decreased R weight shift and stance time, forward trunk lean, and downward head gaze. Patient did not require assist with AD management this session. Provided verbal cues for increased R hamstring activation in pre-swing and stance for increased foot clearance in swiing,  cued patient for looking ahead with reduced foot clearance without visual feedback, and increased lateral hip activation in stance for improved stability.  Educated patient on wearing schedule and benefits of AFO and toe cap. Patient in agreement with wearing AFO with all ambulation at d/c and approved toe cap modification to her personal shoe.   Neuromuscular Re-ed: Patient performed the following R hemi-body motor control activities with a mirror for visual feedback due to R proprioceptive deficits: -seated R controlled knee  extension and flexion x20 -R shoulder flexion with visual target x3 -R gross grip x5 -standing R hamstring curl with hip extension 3x10, focused on hamstring activation in pre-swing with RW, provided multi-modal cues progressing from total A PROM to AROM with knee flexion up to 90 deg -R step forward/backward 2x10 focused on hamstring curl in pre-swing and heel strike at initial contact  -R step forward/backward 2x10 stepping over 1/2" obstacle for improved foot clearance -reciprocal stepping/therapeutic gait 2x30 feet with 2x180 deg turns in non-skid socks to focus on R limb motor control, focused on hamstring activation in pre swing and heel strike at initial contact for improved foot clearance  Wheelchair Mobility: Patient propelled a manual w/c >150 feet with x2 90 deg turns with min cues for L hemi-technique with supervision.  [name] in the room at end of session with breaks locked, seat belt alarm set, and all needs within reach.   Therapy Documentation Precautions:  Precautions Precautions: Fall Precaution Comments: R hemiparesis, R inattention, impaired awareness Restrictions Weight Bearing Restrictions: No    Therapy/Group: Individual Therapy  Markeshia Giebel L Aadith Raudenbush PT, DPT, NCS, CBIS  11/26/2021, 8:03 PM

## 2021-11-26 NOTE — Progress Notes (Signed)
Patient ID: Denise Herrera, female   DOB: 1971/02/08, 52 y.o.   MRN: [id number]  SW faxed outpatient PT/OT/SLP referral to [facility] (p:[phone]/f:[phone]).  Cecile Sheerer, MSW, LCSWA Office: [phone] Cell: [phone] Fax: [phone]

## 2021-11-26 NOTE — Progress Notes (Signed)
Complained of migraine (central). Assessed, and electronic medical chart reviewed as patient claims she has been taking medication for migraine prior to current admission visit. Same verified, and on call provider notified. Provider consulted with pharmacy, and alternative provided as [facility] does not carry patient formulary. Patient informed about alternative, but she declined. She insist on waiting a while, anticipating  possible remission. Reassured and safety ensured.

## 2021-11-26 NOTE — Progress Notes (Signed)
Speech Language Pathology Daily Session Note  Patient Details  Name: Denise Herrera MRN: [id number] Date of Birth: 06/28/1970  Today's Date: 11/26/2021 SLP Individual Time: 0830-0930 SLP Individual Time Calculation (min): 60 min  Short Term Goals: Week 3: SLP Short Term Goal 1 (Week 3): STG=LTG due to ELOS  Skilled Therapeutic Interventions: Skilled treatment session focused on cognitive-communication goals. Upon arrival, patient was upright in the wheelchair and agreeable to treatment session. SLP facilitated session by providing extra time for patient to read a complex paragraph aloud. Min verbal cues were then provided for problem solving to organize the information into a daily schedule. SLP also facilitated session with a high-level abstract generative naming task that patient completed with Mod I for word-finding. Patient left upright in wheelchair with alarm on and all needs within reach. Continue with current plan of care.      Pain Pain Assessment Pain Scale: 0-10 Pain Score: 0-No pain  Therapy/Group: Individual Therapy  Denise Herrera 11/26/2021, 3:31 PM

## 2021-11-26 NOTE — Progress Notes (Signed)
Orthopedic Tech Progress Note Patient Details:  HONEST SAFRANEK 02-01-1971 [id number]  Called in order to [facility] for a PLS-AFO with a toe cap  Patient ID: PERI KREFT, female   DOB: December 23, 1970, 51 y.o.   MRN: [id number]  Donald Pore 11/26/2021, 2:35 PM

## 2021-11-26 NOTE — Progress Notes (Signed)
Received a call from Stefano Gaul,  Ms. Lecker reporting she has a Migraine and asked for her Relpax. With her medical history this provider called and spoke with pharmacist. [facility] doesn't carry Relpax on their Drug.formulary. With her diagnosis of ICH, the pharmacist states their is a  possibilities of adverse effect,with her history of ICH. She has a allergy to Hydrocodone. One time order of oxycodone order and the above can be addressed by Dr Natale Lay in the morning. Placed a call to [facility]eba regarding the above, he verbalizes understanding.

## 2021-11-27 DIAGNOSIS — I619 Nontraumatic intracerebral hemorrhage, unspecified: Secondary | ICD-10-CM | POA: Diagnosis not present

## 2021-11-27 DIAGNOSIS — G43809 Other migraine, not intractable, without status migrainosus: Secondary | ICD-10-CM

## 2021-11-27 DIAGNOSIS — R42 Dizziness and giddiness: Secondary | ICD-10-CM

## 2021-11-27 NOTE — Progress Notes (Signed)
Physical Therapy Session Note  Patient Details  Name: Denise Herrera MRN: [id number] Date of Birth: 01/17/1971  Today's Date: 11/27/2021 PT Concurrent Time: 10:30-1120 and 11:40-1200 PT Concurrent Time Calculation (min): 50 min and 20 min  Short Term Goals: Week 3:  PT Short Term Goal 1 (Week 3): STG=LTG due to ELOS.  Skilled Therapeutic Interventions/Progress Updates:     [name] in the room dressed with B tennis shoes and AFO donned upon PT arrival. Patient alert and agreeable to community outing today. Patient denied pain during session.  Patient participated in group outing, co-treat with Lisa, RT, to [employer] for community integration prior to d/c this week. Focused session on goals for PT, OT, and SLP. Patient met 3 out of 4 goals met during outing. Educated on energy conservation strategies, safety awareness, increased importance of R side attention with increased environmental stimulation, stroke education, healthy food options for improved life-style changes for stroke prevention, and public bathroom planning and use during van ride and rest breaks throughout community outing.  Goals: Pt will propel w/c and navigate obstacles with less than/equal to min assist.  MET   Pt will identify 2 energy conservation strategies with min cues after discussion. MET Pt will problem solve how to access  public restroom with min assist/min cues. MET  Mobility: UP/Down Van Stairs: Patient ascended/descended 3 large steps x2 using L pole support with min A-CGA +2 for safety due to height of steps. Performed step-to gait pattern ascending forwards leading with L and descending backwards leading with R. Provided cues for technique and sequencing.    Ambulation/locomotion: Patient ambulated >100 feet using RW while stopping to stand to look at and reach for items using her R hand >20 min.    Transfers: Patient performed stand pivot and sit to stand transfers with supervision with and  without and AD from w/c, van seat x2, throughout session.   Patient selected a task to complete and performed w/c mobility in and out of the store with Lisa, RT during Community Outing. Please see Lisa's note for details. Patient missed 20 min of skilled PT due to separate tasks  during community outing with RT, Will attempt to make-up missed time as able.    [name] handed off to RT at end of session.   Therapy Documentation Precautions:  Precautions Precautions: Fall Precaution Comments: R hemiparesis, R inattention, impaired awareness Restrictions Weight Bearing Restrictions: No    Therapy/Group:  Concurrent Session for Community Outing  Cherie L Grunenberg PT, DPT, NCS, CBIS  11/27/2021, 4:59 PM  

## 2021-11-27 NOTE — Progress Notes (Signed)
Recreational Therapy Session Note  Patient Details  Name: Denise Herrera MRN: [id number] Date of Birth: [birth year] Today's Date: 11/27/2021  Pt referred for community reintegration by team and pt agreeable to participate in an outing today.  Goals:  Pt will propel w/c and navigate obstacles with less than/equal to min assist.  MET  Pt will identify 2 energy conservation strategies with min cues after discussion. MET Pt will problem solve how to access  public restroom with min assist/min cues. MET  Pt participated in Community reintegration/outing to [employer] at overall contact guard assist w/c level with the exception of uneven surfaces where she required min assist and instructional cues for use of LLE with propulsion. Goals focused on safe community mobility, identification & negotiation of obstacles, accessing public restroom, energy conservation techniques/education.  Pt ambulated greater than 500' with RW, contact guard assist.  Pt utilized RUE as gross assist with supervision for light weight objects and was able to turn the pages of a magazine with min assist.   See outing goal sheet in shadow chart for full details.  Pt stated she enjoyed the outing and found the real life practice helpful.   Therapy/Group: [employer]  Jahleel Stroschein 11/27/2021, 12:49 PM

## 2021-11-27 NOTE — Progress Notes (Signed)
Speech Language Pathology Daily Session Note  Patient Details  Name: Denise Herrera MRN: [id number] Date of Birth: 19-Aug-1970  Today's Date: 11/27/2021 SLP Individual Time: 0725-0820 SLP Individual Time Calculation (min): 55 min  Short Term Goals: Week 3: SLP Short Term Goal 1 (Week 3): STG=LTG due to ELOS  Skilled Therapeutic Interventions: Skilled treatment session focused on cognitive goals. SLP facilitated session by administering the Cognistat. Patient scored WFL on all subtests with the exception of mild impairments in visual construction and moderate impairments in recall. However, patient demonstrates improved recall of functional information with increased daily carryover. SLP provided education regarding importance of utilizing memory compensatory strategies as well as staying cognitively engaged at home after discharge. Patient verbalized understanding. Patient left upright in wheelchair with alarm on and all needs within reach. Continue with current plan of care.      Pain No/Denies Pain   Therapy/Group: Individual Therapy  Tobie Perdue 11/27/2021, 1:23 PM

## 2021-11-27 NOTE — Progress Notes (Addendum)
Patient ID: Denise Herrera, female   DOB: 18-Jul-1970, 51 y.o.   MRN: [id number]  SW spoke with pt husband Iantha Fallen about TTB recommended. Ok with SW ordering and understands will be private pay.   SW ordered TTB with [employer] via parachute. *[facility] shared out of stock of TTB and will have item delivered to the home. Will notify the family.   Cecile Sheerer, MSW, LCSWA Office: [phone] Cell: [phone] Fax: [phone]

## 2021-11-27 NOTE — Progress Notes (Signed)
Patient refused SCD;s educated on purpose stating she understand, but continue to refuse.. PO fluids encouraged.Call bell and bed alarm in place   11/28/21 0500 Restful and w/o complaints or distress, respiration even and unlabored. Continue medical regime.

## 2021-11-27 NOTE — Progress Notes (Signed)
PROGRESS NOTE   Subjective/Complaints:  No further headaches. Reports she feels well overall. Husband in room.  She is looking forward to going home.   ROS: Patient denies fever, rash, sore throat, blurred vision, double vision, dizziness, nausea, vomiting, diarrhea, cough, shortness of breath or chest pain, joint or back/neck pain, or mood change.   headache-resolved  Objective:   No results found. Recent Labs    11/25/21 0654  WBC 4.5  HGB 12.8  HCT 38.2  PLT 298     Recent Labs    11/25/21 0654  NA 140  K 4.0  CL 105  CO2 28  GLUCOSE 100*  BUN 10  CREATININE 0.98  CALCIUM 9.2      Intake/Output Summary (Last 24 hours) at 11/27/2021 0757 Last data filed at 11/27/2021 0715 Gross per 24 hour  Intake 840 ml  Output --  Net 840 ml         Physical Exam: Vital Signs Blood pressure 117/73, pulse (!) 59, temperature 97.9 F (36.6 C), resp. rate 16, height 5' 4.02" (1.626 m), weight 74.6 kg, SpO2 100 %.  Constitutional: No distress . Vital signs reviewed. Sitting in chair HEENT: NCAT,  oral membranes moist, conjugate gaze Neck: supple Cardiovascular: RRR without murmur. No JVD    Respiratory/Chest: CTA Bilaterally without wheezes or rales. Normal rate GI/Abdomen: BS +, non-tender, non-distended,soft Ext: no clubbing, cyanosis, or edema Psych: pleasant and cooperative , jovial  Skin: No evidence of breakdown, no evidence of rash Neurologic: very alert. More fluent language. Still apraxic with speech, motor on right. Improving insight. motor strength is 5/5 in Left deltoid, bicep, tricep, grip, hip flexor, knee extensors, ankle dorsiflexor and plantar flexor. RUE 3+/5 prox to 4/5 distal. RLE 4/5 prox to distal. Impaired LT RUE and RLE. Good sitting balance  Musculoskeletal: Full range of motion in all 4 extremities. No joint swelling   Assessment/Plan: 1. Functional deficits which require 3+ hours per  day of interdisciplinary therapy in a comprehensive inpatient rehab setting. Physiatrist is providing close team supervision and 24 hour management of active medical problems listed below. Physiatrist and rehab team continue to assess barriers to discharge/monitor patient progress toward functional and medical goals  Care Tool:  Bathing    Body parts bathed by patient: Right arm, Chest, Abdomen, Right upper leg, Left upper leg, Face, Front perineal area   Body parts bathed by helper: Left arm, Buttocks, Left lower leg, Right lower leg     Bathing assist Assist Level: Maximal Assistance - Patient 24 - 49%     Upper Body Dressing/Undressing Upper body dressing   What is the patient wearing?: Pull over shirt    Upper body assist Assist Level: Moderate Assistance - Patient 50 - 74%    Lower Body Dressing/Undressing Lower body dressing      What is the patient wearing?: Pants, Incontinence brief     Lower body assist Assist for lower body dressing: Maximal Assistance - Patient 25 - 49%     Toileting Toileting Toileting Activity did not occur (Clothing management and hygiene only): N/A (no void or bm)  Toileting assist Assist for toileting: Moderate Assistance - Patient 50 -  74%     Transfers Chair/bed transfer  Transfers assist     Chair/bed transfer assist level: Minimal Assistance - Patient > 75%     Locomotion Ambulation   Ambulation assist   Ambulation activity did not occur: Safety/medical concerns (required skilled intervention to participate safely in gait training)  Assist level: Minimal Assistance - Patient > 75% Assistive device: Walker-rolling Max distance: 150   Walk 10 feet activity   Assist  Walk 10 feet activity did not occur: Safety/medical concerns  Assist level: Minimal Assistance - Patient > 75% Assistive device: Walker-rolling   Walk 50 feet activity   Assist Walk 50 feet with 2 turns activity did not occur: Safety/medical  concerns  Assist level: Minimal Assistance - Patient > 75% Assistive device: Walker-rolling    Walk 150 feet activity   Assist Walk 150 feet activity did not occur: Safety/medical concerns  Assist level: Minimal Assistance - Patient > 75% Assistive device: Walker-rolling    Walk 10 feet on uneven surface  activity   Assist Walk 10 feet on uneven surfaces activity did not occur: Safety/medical concerns         Wheelchair     Assist Is the patient using a wheelchair?: Yes Type of Wheelchair: Manual    Wheelchair assist level: Total Assistance - Patient < 25% (pt with poor ability to reach floor with foot for hemipropulsion) Max wheelchair distance: 110ft    Wheelchair 50 feet with 2 turns activity    Assist        Assist Level: Total Assistance - Patient < 25%   Wheelchair 150 feet activity     Assist      Assist Level: Total Assistance - Patient < 25%   Blood pressure 117/73, pulse (!) 59, temperature 97.9 F (36.6 C), resp. rate 16, height 5' 4.02" (1.626 m), weight 74.6 kg, SpO2 100 %.  Medical Problem List and Plan: 1. 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   2. DVT/anticoagulation: SCDs             -antiplatelet therapy: N/A 3. Pain Management: Tylenol as needed  -occasionally has sensory-related discomfort on right d/t ICH 4. Mood/Sleep/ADHD: Ritalin 5mg  twice daily seems to be effective             -antipsychotic agents: N/A 5. Neuropsych/cognition: This patient is not yet capable of making decisions on her own behalf. 6. Skin/Wound Care: Routine skin checks 7. Fluids/Electrolytes/Nutrition: encouraging PO  -intake improved. Family bringing in food  -on regular diet  -BMET 7/11 was stable, BUN and Scr improved, continue to encourage fluid intake 8.   Hypertension.  Avapro 300 mg daily, Bystolic 5 mg daily.      -can't push beta blocker any more d/t bradycardia  - improved , follow Vitals:   11/26/21 1935 11/27/21 0405  BP: (!) 137/92 117/73  Pulse: (!) 54 (!) 59  Resp: 16 16  Temp: 98.1 F (36.7 C) 97.9 F (36.6 C)  SpO2: 96% 100%    9.  Hyperlipidemia.  Lipitor, encourage healthy diet 10. Vestibular 11/29/21 d/t ICH---better since initial episode last week  -gaze stabilization exercises provided by therapy  -  meclizine prn.   -7/13 Continues to be  improved 11. Headache-resolved  -resolved, Fioricet prn     LOS: 19 days A FACE TO FACE EVALUATION WAS PERFORMED  Fanny Dance 11/27/2021, 7:57 AM

## 2021-11-27 NOTE — Progress Notes (Signed)
Occupational Therapy Session Note  Patient Details  Name: Denise Herrera MRN: [id number] Date of Birth: 30-Aug-1970  Today's Date: 11/27/2021 OT Individual Time: 1594-5859 OT Individual Time Calculation (min): 60 min    Short Term Goals: Week 2:  OT Short Term Goal 1 (Week 2): Pt will complete UB/LB dressing with CGA. OT Short Term Goal 1 - Progress (Week 2): Met OT Short Term Goal 2 (Week 2): Pt will maintaing dynamic standing balance with CGA for increased independence with functional activties. OT Short Term Goal 2 - Progress (Week 2): Met OT Short Term Goal 3 (Week 2): Pt will demonstrate increased safety awareness to hemi-side. OT Short Term Goal 3 - Progress (Week 2): Met OT Short Term Goal 4 (Week 2): Pt will increase RUE shoulder ROM 10-15 degrees for increased indepdence in ADLs. OT Short Term Goal 4 - Progress (Week 2): Met  Skilled Therapeutic Interventions/Progress Updates:  Pt awake seated in w/c with spouse present upon OT arrival to the room. Pt reports, "I try to use it (RUE in ADLs)." Pt in agreement for OT session. Pt participates in ADL session to improve independence with ADLs/mobility and focus on safety to prepare for safe DC to home.    Therapy Documentation Precautions:  Precautions Precautions: Fall Precaution Comments: R hemiparesis, R inattention, impaired awareness Restrictions Weight Bearing Restrictions: No Vital Signs: Please see "Flowsheet" for most recent vitals charted by nursing staff. Pain: Pain Assessment Pain Scale: 0-10 Pain Score: 0-No pain  ADL: Grooming: Setup (Pt able to complete oral care and hair management while seated in the w/c at the sink with set-up assist for item retrieval and placing items within reach.) Where Assessed-Grooming: Sitting at sink Upper Body Bathing: Supervision/safety (Pt able to bathe all UB parts with distant supervision while seated on tub-bench and using hemi-techniques and bath mitt to involve RUE into  task.) Where Assessed-Upper Body Bathing: Shower Lower Body Bathing: Minimal assistance (Pt requires minimal assist to bathe buttocks, however, pt able to bathe all other body parts with distant supervision while seated on tub-bench uisng comp techniques and bath mitt to involve RUE into task.) Where Assessed-Lower Body Bathing: Shower Upper Body Dressing: Contact guard (Pt able to doff/don pull-over style bra and shirt with minimal assist to pull down in back and make clothing adjustments while seated in the w/c.) Where Assessed-Upper Body Dressing: Wheelchair Lower Body Dressing: Contact guard (Pt able to doff/don underwear and pants using a figure-four position to thread BLE and CGA with FWW for standing balance to pull clothing up/down.) Where Assessed-Lower Body Dressing: Wheelchair (& standing with FWW to pull clothing up/down) Toileting:  (Pt declines need at this time.) Toilet Transfer: Not assessed [employer] Transfer: Close supervision (Pt able to complete SPT from w/c <> tub-bench with close supervision for safety and use of grab bars.) Social research officer, government Method: Stand pivot [employer]: Grab bars, Transfer tub bench ADL Comments: Pt participates well in ADL morning routne to shower and get dressed. Pt able to recall proper hemi-techniques and safety techniques to perform tasks seated vs standing. Pt appropriately asks for help when standing in the shower to decrease fall risks. Pt's spouse in room at beginning of session and reports assisting pt with showering/dressing the other day and has no concerns for DC. Pt demo good safety awareness and no LOB throughout session. Pt requires maximal assistance to don TED hose and moderate assistance to don shoes d/t requiring assist with R shoe and AFO. Pt able  to doff and don B socks using a figure-four position and distant supervision for safety.   Pt requested to stay in the w/c at end of session. Pt left sitting  comfortably in the w/c with personal belongings and call light within reach, belt alarm placed and activated, and comfort needs attended to.    Therapy/Group: Individual Therapy  Barbee Shropshire 11/27/2021, 9:27 AM

## 2021-11-28 MED ORDER — NEBIVOLOL HCL 5 MG PO TABS: 5.0000 mg | ORAL_TABLET | Freq: Every day | ORAL | 0 refills | Status: AC

## 2021-11-28 MED ORDER — SENNOSIDES-DOCUSATE SODIUM 8.6-50 MG PO TABS: 1.0000 | ORAL_TABLET | Freq: Two times a day (BID) | ORAL | Status: DC

## 2021-11-28 MED ORDER — TELMISARTAN 40 MG PO TABS: 40.0000 mg | ORAL_TABLET | Freq: Every day | ORAL | 3 refills | Status: DC

## 2021-11-28 MED ORDER — MECLIZINE HCL 12.5 MG PO TABS: 12.5000 mg | ORAL_TABLET | Freq: Three times a day (TID) | ORAL | 0 refills | Status: DC | PRN

## 2021-11-28 MED ORDER — METHYLPHENIDATE HCL 5 MG PO TABS: 5.0000 mg | ORAL_TABLET | Freq: Two times a day (BID) | ORAL | 0 refills | Status: DC

## 2021-11-28 MED ORDER — ACETAMINOPHEN 325 MG PO TABS: 650.0000 mg | ORAL_TABLET | ORAL | Status: AC | PRN

## 2021-11-28 MED ORDER — PANTOPRAZOLE SODIUM 40 MG PO TBEC: 40.0000 mg | DELAYED_RELEASE_TABLET | Freq: Every day | ORAL | 0 refills | Status: AC

## 2021-11-28 MED ORDER — ATORVASTATIN CALCIUM 20 MG PO TABS: 20.0000 mg | ORAL_TABLET | Freq: Every day | ORAL | 0 refills | Status: AC

## 2021-11-28 NOTE — Progress Notes (Signed)
Occupational Therapy Session Note  Patient Details  Name: Denise Herrera MRN: [id number] Date of Birth: 03-22-1971  Today's Date: 11/28/2021 OT Individual Time: 4268-3419 OT Individual Time Calculation (min): 53 min    Short Term Goals: Week 2:  OT Short Term Goal 1 (Week 2): Pt will complete UB/LB dressing with CGA. OT Short Term Goal 1 - Progress (Week 2): Met OT Short Term Goal 2 (Week 2): Pt will maintaing dynamic standing balance with CGA for increased independence with functional activties. OT Short Term Goal 2 - Progress (Week 2): Met OT Short Term Goal 3 (Week 2): Pt will demonstrate increased safety awareness to hemi-side. OT Short Term Goal 3 - Progress (Week 2): Met OT Short Term Goal 4 (Week 2): Pt will increase RUE shoulder ROM 10-15 degrees for increased indepdence in ADLs. OT Short Term Goal 4 - Progress (Week 2): Met  Skilled Therapeutic Interventions/Progress Updates:      Therapy Documentation Precautions:  Precautions Precautions: Fall Precaution Comments: R hemiparesis, R inattention, impaired awareness Restrictions Weight Bearing Restrictions: No Vital Signs: Please see "Flowsheet" for most recent vitals charted by nursing.  Pain: Pain Assessment Pain Scale: 0-10 Pain Score: 0-No pain  ADL: Pt reported already completing ADL prior to OT session. Pt's son (Cam) present for family training. Dicussed pt and son's concerns for DC. Pt and son report no concerns for self care tasks. Son reported assisting pt to the bathroom already and no concerns with this. OT reinforced importance of providing CGA - close supervision while standing on pt's R side and that pt need supervision at all times. Pt and son verbalize understanding.  OT provides handout and skilled demo on techniques to use tub-transfer bench. Pt was able to then explain the technique to use the tub bench to son to demo understanding. However, pt reports plan to use walk-in shower until tub-bench is  delivered. Education provided to pt and son on techniques to perform safe transfer to walk-in shower and encouraged close supervision - CGA. Pt reports no usually wearing shoes at home and OT educated pt on how wearing non-skid shoes or socks will decrease fall risks. Pt verbalizes understanding.   Stair Training: Pt's son reports that main concern is providing assistance with stairs. Encouraged pt to perform teach-back method of stair techniques to son to ensure understanding and carry-over. Pt able direct care to have son provide safe assistance to perform stair techniques using folded up walker for support on L side. Pt's son able to provide proper assistance with only minimal prompts. Pt also requires 1 VC for proper techniques to ascend/descend stairs ("up with the good, down with the bad").  Pt and son report no other concerns for DC home. OT reinforced the importance of 24-hour supervision and potential need for prompts to keep R side safe due to decreased awareness.   Pt requested to stay in the w/c at end of session. Pt left sitting comfortably in the w/c with personal belongings and call light within reach, belt alarm placed and activated, son present in room, and comfort needs attended to.   Therapy/Group: Individual Therapy  Barbee Shropshire 11/28/2021, 11:35 AM

## 2021-11-28 NOTE — Progress Notes (Signed)
Speech Language Pathology Discharge Summary  Patient Details  Name: Denise Herrera MRN: [id number] Date of Birth: Dec 10, 1970  Today's Date: 11/28/2021 SLP Individual Time: 1005-1045 SLP Individual Time Calculation (min): 40 min   Skilled Therapeutic Interventions:  Skilled treatment session focused on completion of family education with the patient and her husband. SLP provided education regarding patient's current functional communication and strategies to utilize at home to maximize word-finding and speech intelligibility at the conversation level. SLP also provided education regarding patient's current cognitive functioning and strategies to utilize at home to maximize recall of new information, attention, problem solving and overall safety with emphasis on the importance of 24 hour supervision. Both verbalized understanding and handouts were given to reinforce information. Patient left upright in wheelchair with family present.     Patient has met 8 of 8 long term goals.  Patient to discharge at overall Supervision;Min level.   Reasons goals not met: N/A   Clinical Impression/Discharge Summary: Patient has made excellent gains and has met 8 of 8 LTGs this admission. Currently, patient demonstrates improved functional communication and requires intermittent supervision level verbal cues use of speech intelligibility strategies, high-level word-finding, and to self-monitor and correct phonemic errors at the converation level to achieve 100% intelligibility. Patient also demonstrates improved cognitive functioning and requires supervision-Min A verbal cues to complete functional and complex tasks safely in regards to complex problem solving, emergent awareness, recall with use of compensatory strategies, alternating attention, and overall safety awareness. Patient and family education is complete and patient will discharge home with 24 hour supervision from family. Patient would benefit from f/u  outpatient SLP services to maximize her cognitive-linguistic functioning and overall functional independence.   Care Partner:  Caregiver Able to Provide Assistance: Yes  Type of Caregiver Assistance: Physical;Cognitive  Recommendation:  Outpatient SLP;24 hour supervision/assistance  Rationale for SLP Follow Up: Reduce caregiver burden;Maximize functional communication;Maximize cognitive function and independence   Equipment: N/A   Reasons for discharge: Discharged from hospital;Treatment goals met   Patient/Family Agrees with Progress Made and Goals Achieved: Yes    Torianna Junio, [place] 11/28/2021, 7:10 AM

## 2021-11-28 NOTE — Progress Notes (Signed)
Recreational Therapy Discharge Summary Patient Details  Name: Denise Herrera MRN: [id number] Date of Birth: 1970/07/05 Today's Date: 11/28/2021  Comments on progress toward goals: Pt with limited interest in TR services during LOS but did participate in community reintegration in addition to pt education on leisure education, activity analysis identifying potential modifications, social, emotional and spiritual health's role in continued rehab and recovery.  Pt participated in an outing to the grocery store at overall contact guard assist level both ambulatory and w/c level.  On uneven surfaces, pt required min assist and verbal cues for w/c propulsion on outdoor uneven surfaces.  Pt is discharging home today with family to provided the needed supervision/assistance.  Reasons for discharge: discharge from hospital  Follow-up: Outpatient  Patient/family agrees with progress made and goals achieved: Yes  Denise Herrera 11/28/2021, 8:36 AM

## 2021-11-28 NOTE — Progress Notes (Signed)
Occupational Therapy Discharge Summary  Patient Details  Name: Denise Herrera MRN: [id number] Date of Birth: 01/16/1971  Patient has met 12 of 12 long term goals due to improved activity tolerance, improved balance, postural control, ability to compensate for deficits, functional use of  RIGHT upper extremity, improved attention, improved awareness, and improved coordination.  Patient to discharge at overall Supervision level.  Patient's care partner is independent to provide the necessary physical and cognitive assistance at discharge.    Reasons goals not met: n/a  Recommendation:  Patient will benefit from ongoing skilled OT services in outpatient setting to continue to advance functional skills in the area of BADL, iADL, and Reduce care partner burden.  Equipment: TTB  Reasons for discharge: treatment goals met and discharge from hospital  Patient/family agrees with progress made and goals achieved: Yes  OT Discharge Precautions/Restrictions  Precautions Precautions: Fall Precaution Comments: R hemiparesis, R inattention, impaired awareness Restrictions: R AFO Weight Bearing Restrictions: No Pain Pain Assessment Pain Scale: 0-10 Pain Score: 0-No pain Multiple Pain Sites: No ADL ADL Eating: Set up Grooming: Setup Where Assessed-Grooming: Sitting at sink Upper Body Bathing: Supervision/safety Where Assessed-Upper Body Bathing: Shower Lower Body Bathing: Supervision/safety Where Assessed-Lower Body Bathing: Shower Upper Body Dressing: Supervision/safety (pt able to don/doff bra and UB clothing using hemi-technique) Where Assessed-Upper Body Dressing: Wheelchair Lower Body Dressing: Supervision/safety Where Assessed-Lower Body Dressing: Wheelchair Toileting: Supervision/safety Toilet Transfer: Close supervision Toilet Transfer Method: Ambulating, Other (comment) (using RW to get into bathroom able to utilize grab bars for safety when sitting) Science writer:  Grab bars Tub/Shower Transfer: Close supervison Clinical cytogeneticist Method: Optometrist: Facilities manager: Close supervision Social research officer, government Method: Heritage manager: Grab bars, Transfer tub bench ADL Comments: Pt participates well in ADL morning routne to shower and get dressed. Pt able to recall proper hemi-techniques and safety techniques to perform tasks seated vs standing. Pt appropriately asks for help when standing in the shower to decrease fall risks. Pt's spouse in room at beginning of session and reports assisting pt with showering/dressing the other day and has no concerns for DC. Pt demo good safety awareness and no LOB throughout session. Pt requires maximal assistance to don TED hose and moderate assistance to don shoes d/t requiring assist with R shoe and AFO. Pt able to doff and don B socks using a figure-four position and distant supervision for safety. Vision Baseline Vision/History: 1 Wears glasses Patient Visual Report: No change from baseline Vision Assessment?: No apparent visual deficits Visual Fields: No apparent deficits Perception  Perception: Impaired Inattention/Neglect: Does not attend to right side of body Praxis Praxis: Impaired Praxis Impairment Details: Motor planning Cognition Cognition Overall Cognitive Status: Impaired/Different from baseline Arousal/Alertness: Awake/alert Orientation Level: Person;Place;Situation Person: Oriented Place: Oriented Situation: Oriented Memory: Impaired Memory Impairment: Retrieval deficit;Decreased short term memory Attention: Alternating Focused Attention: Appears intact Sustained Attention: Appears intact Sustained Attention Impairment: Verbal basic Selective Attention: Appears intact Alternating Attention Impairment: Functional complex;Verbal complex Awareness: Impaired Awareness Impairment: Emergent impairment Problem Solving:  Impaired Problem Solving Impairment: Functional complex Safety/Judgment: Impaired Comments: sometimes does not attend to R side and is impulsive with transfers Brief Interview for Mental Status (BIMS) Repetition of Three Words (First Attempt): 3 Temporal Orientation: Year: Correct Temporal Orientation: Month: Accurate within 5 days Temporal Orientation: Day: Correct Recall: "Sock": No, could not recall Recall: "Blue": Yes, no cue required Recall: "Bed": No, could not recall BIMS Summary Score: 11 Sensation Sensation Light Touch: Impaired  Light Touch Impaired Details: Impaired RUE Hot/Cold: Impaired RUE Coordination: Impaired RUE Gross Motor Movements are Fluid and Coordinated: No Fine Motor Movements are Fluid and Coordinated: No Motor  Motor Motor: Hemiplegia;Abnormal tone Motor - Discharge Observations: R hemi with impaired sensation Mobility  Bed Mobility Bed Mobility: Supine to Sit;Sit to Supine;Rolling Right;Right Sidelying to Sit;Sit to Sidelying Right Rolling Right: Supervision/verbal cueing Rolling Left: Supervision/Verbal cueing Right Sidelying to Sit: Supervision/Verbal cueing Supine to Sit: Supervision/Verbal cueing Sit to Supine: Supervision/Verbal cueing Sit to Sidelying Right: Supervision/Verbal cueing Transfers Sit to Stand: Supervision/Verbal cueing Stand to Sit: Supervision/Verbal cueing  Trunk/Postural Assessment  Cervical Assessment Cervical Assessment: Within Functional Limits Thoracic Assessment Thoracic Assessment: Within Functional Limits Lumbar Assessment Lumbar Assessment: Exceptions to [facility] Postural Control Postural Control: Deficits on evaluation Righting Reactions: delayed and inadequate Protective Responses: delayed and inadequate  Balance Balance Balance Assessed: Yes Static Sitting Balance Static Sitting - Balance Support: Feet supported Static Sitting - Level of Assistance: 5: Stand by assistance Dynamic Sitting Balance Dynamic  Sitting - Balance Support: Feet supported Dynamic Sitting - Level of Assistance: 5: Stand by assistance Dynamic Sitting - Balance Activities: Forward lean/weight shifting;Reaching for objects Static Standing Balance Static Standing - Balance Support: During functional activity Static Standing - Level of Assistance: 5: Stand by assistance Dynamic Standing Balance Dynamic Standing - Balance Support: During functional activity Dynamic Standing - Level of Assistance: 5: Stand by assistance Dynamic Standing - Balance Activities: Reaching for objects;Reaching across midline;Reaching for weighted objects Extremity/Trunk Assessment RUE Assessment RUE Assessment: Exceptions to [facility] Active Range of Motion (AROM) Comments: WFL General Strength Comments: MMT shoulder flexion 5/5, elbow 4+/5 RUE Body System: Neuro Brunstrum levels for arm and hand: Arm;Hand Brunstrum level for arm: Stage V Relative Independence from Synergy Brunstrum level for hand: Stage VI Isolated joint movements LUE Assessment LUE Assessment: Within Functional Limits   Ayoub Arey 11/28/2021, 12:46 PM

## 2021-11-28 NOTE — Progress Notes (Signed)
Inpatient Rehabilitation Care Coordinator Discharge Note   Patient Details  Name: Denise Herrera MRN: [id number] Date of Birth: 28-Feb-1971   Discharge location: D/c to home with husband and son  Length of Stay: 19 days  Discharge activity level: household ambulatory level Supervision-CGA using RW with R hand splint and R AFO and w/c level in the community for long distances  Home/community participation: Limited  Patient response OF:HQRFXJ Literacy - How often do you need to have someone help you when you read instructions, pamphlets, or other written material from your doctor or pharmacy?: Never  Patient response OI:TGPQDI Isolation - How often do you feel lonely or isolated from those around you?: Never  Services provided included: MD, RD, PT, RN, CM, TR, Pharmacy, Neuropsych, SW, SLP, OT  Financial Services:  Financial Services Utilized: [employer]  Choices offered to/list presented to: yes  Follow-up services arranged:  Outpatient, DME    Outpatient Servicies: [facility] for PT/OT/SLP DME : [employer] for RW, 3in1 BSC, TTB, and w/c    Patient response to transportation need: Is the patient able to respond to transportation needs?: Yes In the past 12 months, has lack of transportation kept you from medical appointments or from getting medications?: No In the past 12 months, has lack of transportation kept you from meetings, work, or from getting things needed for daily living?: No   Comments (or additional information):  Patient/Family verbalized understanding of follow-up arrangements:  Yes  Individual responsible for coordination of the follow-up plan: contact pt or pt husband Iantha Fallen  Confirmed correct DME delivered: Gretchen Short 11/28/2021    Gretchen Short

## 2021-11-28 NOTE — Progress Notes (Addendum)
Inpatient Rehabilitation Discharge Medication Review by a Pharmacist  A complete drug regimen review was completed for this patient to identify any potential clinically significant medication issues.  High Risk Drug Classes Is patient taking? Indication by Medication  Antipsychotic No   Anticoagulant No   Antibiotic No   Opioid No   Antiplatelet No   Hypoglycemics/insulin No   Vasoactive Medication Yes Bystolic, micardis- hypertension  Chemotherapy No   Other Yes Lipitor- HLD Protonix- GERD Ritalin- narcolepsy Zyrtec- seasonal allergies Relpax- migraines Nasacort- seasonal rhinitis     Type of Medication Issue Identified Description of Issue Recommendation(s)  Drug Interaction(s) (clinically significant)     Duplicate Therapy     Allergy     No Medication Administration End Date     Incorrect Dose     Additional Drug Therapy Needed     Significant med changes from prior encounter (inform family/care partners about these prior to discharge).    Other       Clinically significant medication issues were identified that warrant physician communication and completion of prescribed/recommended actions by midnight of the next day:  No   Time spent performing this drug regimen review (minutes):  30   Ludene Stokke BS, PharmD, BCPS Clinical Pharmacist 11/28/2021 9:41 AM  Contact: [phone] after 3 PM  "Be curious, not judgmental..." -Debbora Dus

## 2021-11-28 NOTE — Progress Notes (Addendum)
Physical Therapy Session Note  Patient Details  Name: Denise Herrera MRN: [id number] Date of Birth: 22-Jul-1970  Today's Date: 11/28/2021 PT Individual Time: 0905-1000 and 1435-1535 PT Individual Time Calculation (min): 55 min and 60 min  Short Term Goals: Week 3:  PT Short Term Goal 1 (Week 3): STG=LTG due to ELOS.  Skilled Therapeutic Interventions/Progress Updates:     Session 1: Patient sitting EOB with her husband upon PT arrival. Patient alert and agreeable to PT session. Patient denied pain during session.  Patient's husband present for family education and hands on training throughout session. Performed safe guarding with all mobility following PT cues and/or demonstration. Educated on importance of R side attention and limb awareness/positioning, safety awareness, fall risk/prevention, home modifications to prevent falls, and activation of emergency services in the event of a fall during session. Recommended supervision with all mobility with use of RW, R hand splint, and R AFO with shoes with all ambulation, recommended ambulation as main form of locomotion in the home and use of w/c in the community for safety due to increased environmental stimulation. Patient and her husband in agreement with all recommendations.  Therapeutic Activity: Transfers: Patient performed sit to/from stand from the w/c, ADL recliner, and mat table with supervision using RW with R hand splint, she also performed stand pivot using arm rests without AD. Provided verbal cues for reaching back to sit x2. Patient reports she plans to sleep in the recliner on the first level of their home initially upon d/c. Patient performed a simulated sedan height car transfer with supervision using RW with R hand splint. Provided cues for safe technique.  Gait Training:  Patient ambulated >250 feet using RW with R hand splint and R AFO with supervision . Ambulated with decreased gait speed, decreased step length and height  on R, decreased R weight shift and stance time, forward trunk lean, and downward head gaze. Provided verbal cues for increased R hamstring activation in pre-swing and stance for increased foot clearance in swing, looking ahead, and increased lateral hip activation in stance for improved stability.  Patient ascended/descended 12x6" steps using folded RW on L (no hand rails at home) with CGA. Performed step-to gait pattern leading with L while ascending and R while descendin. Provided cues for technique and sequencing.   Patient ambulated up/down a ramp, over 10 feet of mulch (unlevel surface), and up/down a curb to simulate community ambulation over unlevel surfaces with CGA using RW with R hand splint and AFO. Provided cues for technique and use of AD.  [name] handed off to SLP in the hallway for continued family education.   Session 2: [name] in the room upon PT arrival. Patient alert and agreeable to PT session. Patient denied pain during session, requested to go outside during session. Reports her R hemi-body gets cold and stiff sitting in the room.   Focused session on d/c assessment (see d/c note), 6 Minute Walk Test Assessment, and d/c planning and reinforcement of stroke education outside for therapeutic effect.   Therapeutic Activity: Transfers: Patient performed sit to/from stand x1 with supervision for safety using RW and R hand splint.   6 Min Walk Test:  Instructed patient to ambulate as quickly and as safely as possible for 6 minutes using LRAD. Patient was allowed to take standing rest breaks without stopping the test, but if the patient required a sitting rest break the clock would be stopped and the test would be over.  Results:  426 feet (130 meters, Avg speed 0.4 m/s) using a RW with R hand splint and R PLS AFO with supervision. Results indicate that the patient has reduced endurance with ambulation compared to age matched norms.  Age Matched Norms: 58-69 yo F: 538  meters MDC: 58.21 meters (190.98 feet) or 50 meters (ANPTA Core Set of Outcome Measures for Adults with Neurologic Conditions, 2018)  Wheelchair Mobility: Patient propelled her w/c >150 feet with mod I using B lower extremities and L upper extremity.  [name] in the room at end of session with breaks locked and all needs within reach.   Therapy Documentation Precautions:  Precautions Precautions: Fall Precaution Comments: R hemiparesis, R inattention, impaired awareness Required Braces or Orthoses: Other Brace Other Brace: R PLS AFO Restrictions Weight Bearing Restrictions: No    Therapy/Group: Individual Therapy  Chicquita Mendel L Caidan Hubbert PT, DPT, NCS, CBIS  11/28/2021, 4:52 PM

## 2021-11-28 NOTE — Progress Notes (Signed)
Physical Therapy Discharge Summary  Patient Details  Name: Denise Herrera MRN: [id number] Date of Birth: 1971-05-17  Today's Date: 11/28/2021   Patient has met 11 of 11 long term goals due to improved activity tolerance, improved balance, improved postural control, increased strength, increased range of motion, ability to compensate for deficits, functional use of  right lower extremity, improved attention, improved awareness, and improved coordination.  Patient to discharge at an household ambulatory level Supervision-CGA using RW with R hand splint and R AFO and w/c level in the community for long distances.   Patient's care partner is independent to provide the necessary physical and cognitive assistance at discharge.  Reasons goals not met: All PT goals met at this time.  Recommendation:  Patient will benefit from ongoing skilled PT services in outpatient setting to continue to advance safe functional mobility, address ongoing impairments in balance, R hemi-body motor control and attention, functional mobility, gait and stair training, community integration, work conditioning, , and minimize fall risk.  Equipment: RW with R hand splint, R PLS AFO, 18"x18" manual wheelchair  Reasons for discharge: treatment goals met  Patient/family agrees with progress made and goals achieved: Yes  PT Discharge Precautions/Restrictions Precautions Precautions: Fall Precaution Comments: R hemiparesis, R inattention, impaired awareness Required Braces or Orthoses: Other Brace Other Brace: R PLS AFO Restrictions Weight Bearing Restrictions: No Pain Pain Assessment Pain Scale: 0-10 Pain Score: 0-No pain Pain Interference Pain Interference Pain Effect on Sleep: 0. Does not apply - I have not had any pain or hurting in the past 5 days Pain Interference with Therapy Activities: 1. Rarely or not at all Pain Interference with Day-to-Day Activities: 1. Rarely or not at all Vision/Perception  Vision  - History Ability to See in Adequate Light: 0 Adequate Vision - Assessment Tracking/Visual Pursuits: Able to track stimulus in all quads without difficulty Perception Perception: Impaired Inattention/Neglect: Does not attend to right side of body Praxis Praxis: Impaired Praxis Impairment Details: Motor planning  Cognition Overall Cognitive Status: Impaired/Different from baseline Arousal/Alertness: Awake/alert Orientation Level: Oriented X4 Focused Attention: Appears intact Sustained Attention: Impaired Sustained Attention Impairment: Verbal basic;Functional basic Selective Attention: Impaired Selective Attention Impairment: Verbal basic;Functional basic Memory: Impaired Memory Impairment: Retrieval deficit;Decreased short term memory Awareness: Impaired Awareness Impairment: Emergent impairment Safety/Judgment: Impaired Comments: sometimes does not attend to R side and is impulsive with transfers Sensation Sensation Light Touch: Impaired Detail Light Touch Impaired Details: Absent RLE;Absent RUE Hot/Cold: Impaired by gross assessment Proprioception: Impaired Detail Proprioception Impaired Details: Absent RLE;Absent RUE Stereognosis: Not tested Coordination Gross Motor Movements are Fluid and Coordinated: No Fine Motor Movements are Fluid and Coordinated: No Coordination and Movement Description: R hemiplegia with absent sensation and impaired attention to R hemi-body Heel Shin Test: dysmetria on the L Motor  Motor Motor: Hemiplegia;Abnormal tone Motor - Discharge Observations: R hemi with impaired sensation  Mobility Bed Mobility Bed Mobility: Supine to Sit;Sit to Supine;Rolling Right;Right Sidelying to Sit;Sit to Sidelying Right Rolling Right: Supervision/verbal cueing Rolling Left: Supervision/Verbal cueing Right Sidelying to Sit: Supervision/Verbal cueing Supine to Sit: Supervision/Verbal cueing Sit to Supine: Supervision/Verbal cueing Sit to Sidelying Right:  Supervision/Verbal cueing Transfers Transfers: Sit to Stand;Stand to Sit;Transfer Sit to Stand: Supervision/Verbal cueing Stand to Sit: Supervision/Verbal cueing Stand Pivot Transfers: Supervision/Verbal cueing Stand Pivot Transfer Details: Verbal cues for safe use of DME/AE;Verbal cues for technique;Verbal cues for precautions/safety Transfer (Assistive device): Rolling walker Locomotion  Gait Gait Assistance: Supervision/Verbal cueing Gait Distance (Feet): 426 Feet (during 6 Min Walk Test) Assistive  device: Rolling walker (with R hand splint and R AFO) Gait Assistance Details: Verbal cues for precautions/safety;Verbal cues for technique;Verbal cues for gait pattern;Verbal cues for safe use of DME/AE Gait Gait: Yes Gait Pattern: Impaired Gait Pattern: Step-through pattern;Decreased stance time - right;Decreased stride length;Decreased hip/knee flexion - right;Decreased dorsiflexion - right;Decreased weight shift to right Gait velocity: 0.4 m/s avg on 6MWT Stairs / Additional Locomotion Stairs: Yes Stairs Assistance: Contact Guard/Touching assist Stair Management Technique: With walker Number of Stairs: 8 Height of Stairs: 6 Ramp: Contact Guard/touching assist (with RW) Curb: Contact Guard/Touching assist (with RW) Wheelchair Mobility Wheelchair Mobility: Yes Wheelchair Assistance: Independent with assistive device Wheelchair Propulsion: Both lower extermities;Right upper extremity Wheelchair Parts Management: Needs assistance Distance: >150 ft  Trunk/Postural Assessment  Cervical Assessment Cervical Assessment: Within Functional Limits Thoracic Assessment Thoracic Assessment: Within Functional Limits Lumbar Assessment Lumbar Assessment: Exceptions to [facility] Postural Control Postural Control: Deficits on evaluation Righting Reactions: delayed and inadequate Protective Responses: delayed and inadequate  Balance Standardized Balance Assessment Standardized Balance Assessment:  Berg Balance Test Berg Balance Test Sit to Stand: Able to stand without using hands and stabilize independently Standing Unsupported: Able to stand safely 2 minutes Sitting with Back Unsupported but Feet Supported on Floor or Stool: Able to sit safely and securely 2 minutes Stand to Sit: Sits safely with minimal use of hands Transfers: Able to transfer with verbal cueing and /or supervision Standing Unsupported with Eyes Closed: Able to stand 10 seconds with supervision Standing Ubsupported with Feet Together: Needs help to attain position but able to stand for 30 seconds with feet together From Standing, Reach Forward with Outstretched Arm: Can reach forward >12 cm safely (5") From Standing Position, Pick up Object from Floor: Able to pick up shoe, needs supervision From Standing Position, Turn to Look Behind Over each Shoulder: Looks behind one side only/other side shows less weight shift Turn 360 Degrees: Needs assistance while turning Standing Unsupported, Alternately Place Feet on Step/Stool: Needs assistance to keep from falling or unable to try Standing Unsupported, One Foot in Front: Able to take small step independently and hold 30 seconds Standing on One Leg: Unable to try or needs assist to prevent fall Total Score: 33 Static Sitting Balance Static Sitting - Balance Support: Feet supported Static Sitting - Level of Assistance: 7: Independent Dynamic Sitting Balance Dynamic Sitting - Balance Support: Feet supported Dynamic Sitting - Level of Assistance: 6: Modified independent (Device/Increase time) Dynamic Sitting - Balance Activities: Forward lean/weight shifting;Reaching for objects Static Standing Balance Static Standing - Balance Support: During functional activity Static Standing - Level of Assistance: 5: Stand by assistance Dynamic Standing Balance Dynamic Standing - Balance Support: During functional activity Dynamic Standing - Level of Assistance: 5: Stand by  assistance Extremity Assessment  RLE Assessment RLE Assessment: Exceptions to [facility] Passive Range of Motion (PROM) Comments: WFL/WNL RLE Strength Right Hip Flexion: 4+/5 Right Knee Flexion: 4/5 Right Knee Extension: 5/5 Right Ankle Dorsiflexion: 4/5 Right Ankle Plantar Flexion: 4/5 LLE Assessment LLE Assessment: Within Functional Limits General Strength Comments: Grossly 5/5 throughout in sitting    Dozier Berkovich L Jonathen Rathman PT, DPT, NCS, CBIS  11/28/2021, 2:50 PM

## 2021-11-28 NOTE — Plan of Care (Cosign Needed)
  Problem: RH Balance Goal: LTG: Patient will maintain dynamic sitting balance (OT) Description: LTG:  Patient will maintain dynamic sitting balance with assistance during activities of daily living (OT) Outcome: Completed/Met Goal: LTG Patient will maintain dynamic standing with ADLs (OT) Description: LTG:  Patient will maintain dynamic standing balance with assist during activities of daily living (OT)  Outcome: Completed/Met   Problem: Sit to Stand Goal: LTG:  Patient will perform sit to stand in prep for activites of daily living with assistance level (OT) Description: LTG:  Patient will perform sit to stand in prep for activites of daily living with assistance level (OT) Outcome: Completed/Met   Problem: RH Eating Goal: LTG Patient will perform eating w/assist, cues/equip (OT) Description: LTG: Patient will perform eating with assist, with/without cues using equipment (OT) Outcome: Completed/Met   Problem: RH Grooming Goal: LTG Patient will perform grooming w/assist,cues/equip (OT) Description: LTG: Patient will perform grooming with assist, with/without cues using equipment (OT) Outcome: Completed/Met   Problem: RH Bathing Goal: LTG Patient will bathe all body parts with assist levels (OT) Description: LTG: Patient will bathe all body parts with assist levels (OT) Outcome: Completed/Met   Problem: RH Dressing Goal: LTG Patient will perform upper body dressing (OT) Description: LTG Patient will perform upper body dressing with assist, with/without cues (OT). Outcome: Completed/Met Goal: LTG Patient will perform lower body dressing w/assist (OT) Description: LTG: Patient will perform lower body dressing with assist, with/without cues in positioning using equipment (OT) Outcome: Completed/Met   Problem: RH Toileting Goal: LTG Patient will perform toileting task (3/3 steps) with assistance level (OT) Description: LTG: Patient will perform toileting task (3/3 steps) with  assistance level (OT)  Outcome: Completed/Met   Problem: RH Functional Use of Upper Extremity Goal: LTG Patient will use RT/LT upper extremity as a (OT) Description: LTG: Patient will use right/left upper extremity as a stabilizer/gross assist/diminished/nondominant/dominant level with assist, with/without cues during functional activity (OT) Outcome: Completed/Met   Problem: RH Toilet Transfers Goal: LTG Patient will perform toilet transfers w/assist (OT) Description: LTG: Patient will perform toilet transfers with assist, with/without cues using equipment (OT) Outcome: Completed/Met   

## 2021-11-28 NOTE — Progress Notes (Signed)
Patient ID: Denise Herrera, female   DOB: 1970-10-25, 51 y.o.   MRN: [id number]  SW met with pt and pt husband during PT session to discuss w/c recommended. Pt and husband would like SW to purchase item.  SW ordered w/c with [employer] via parachute.   Loralee Pacas, MSW, [place] Office: [phone] Cell: [phone] Fax: [phone]

## 2021-11-28 NOTE — Progress Notes (Signed)
PROGRESS NOTE   Subjective/Complaints:  No headaches today. She is excited to go home tomorrow.   ROS: Patient denies fever, rash, sore throat, congestion, blurred vision, double vision, dizziness, nausea, vomiting, diarrhea, cough, shortness of breath or chest pain, joint or back/neck pain, or mood change.   headache-resolved  Objective:   No results found. No results for input(s): "WBC", "HGB", "HCT", "PLT" in the last 72 hours.   No results for input(s): "NA", "K", "CL", "CO2", "GLUCOSE", "BUN", "CREATININE", "CALCIUM" in the last 72 hours.    Intake/Output Summary (Last 24 hours) at 11/28/2021 0846 Last data filed at 11/28/2021 0720 Gross per 24 hour  Intake 840 ml  Output --  Net 840 ml         Physical Exam: Vital Signs Blood pressure (!) 147/61, pulse (!) 56, temperature 98.1 F (36.7 C), resp. rate 17, height 5' 4.02" (1.626 m), weight 74.6 kg, SpO2 100 %.  Constitutional: No distress . Vital signs reviewed. Sitting in chair HEENT: NCAT,  oral membranes moist, conjugate gaze Neck: supple Cardiovascular: RRR without murmur. No JVD    Respiratory/Chest: CTA Bilaterally without wheezes or rales. Normal rate GI/Abdomen: BS +, non-tender, non-distended,soft Ext: no clubbing, cyanosis, or edema Psych: pleasant and cooperative , jovial  Skin: No evidence of breakdown, no evidence of rash Neurologic: very alert. More fluent language. Still apraxic with speech, motor on right. Improving insight. motor strength is 5/5 in Left deltoid, bicep, tricep, grip, hip flexor, knee extensors, ankle dorsiflexor and plantar flexor. RUE 3+/5 prox to 4/5 distal. RLE 4/5 prox to distal. Impaired LT RUE and RLE. Good sitting balance Wearing R AFO with therapy Musculoskeletal: Full range of motion in all 4 extremities. No joint swelling   Assessment/Plan: 1. Functional deficits which require 3+ hours per day of interdisciplinary  therapy in a comprehensive inpatient rehab setting. Physiatrist is providing close team supervision and 24 hour management of active medical problems listed below. Physiatrist and rehab team continue to assess barriers to discharge/monitor patient progress toward functional and medical goals  Care Tool:  Bathing    Body parts bathed by patient: Right arm, Chest, Abdomen, Right upper leg, Left upper leg, Face, Front perineal area   Body parts bathed by helper: Left arm, Buttocks, Left lower leg, Right lower leg     Bathing assist Assist Level: Maximal Assistance - Patient 24 - 49%     Upper Body Dressing/Undressing Upper body dressing   What is the patient wearing?: Pull over shirt    Upper body assist Assist Level: Moderate Assistance - Patient 50 - 74%    Lower Body Dressing/Undressing Lower body dressing      What is the patient wearing?: Pants, Incontinence brief     Lower body assist Assist for lower body dressing: Maximal Assistance - Patient 25 - 49%     Toileting Toileting Toileting Activity did not occur (Clothing management and hygiene only): N/A (no void or bm)  Toileting assist Assist for toileting: Moderate Assistance - Patient 50 - 74%     Transfers Chair/bed transfer  Transfers assist     Chair/bed transfer assist level: Minimal Assistance - Patient > 75%  Locomotion Ambulation   Ambulation assist   Ambulation activity did not occur: Safety/medical concerns (required skilled intervention to participate safely in gait training)  Assist level: Minimal Assistance - Patient > 75% Assistive device: Walker-rolling Max distance: 150   Walk 10 feet activity   Assist  Walk 10 feet activity did not occur: Safety/medical concerns  Assist level: Minimal Assistance - Patient > 75% Assistive device: Walker-rolling   Walk 50 feet activity   Assist Walk 50 feet with 2 turns activity did not occur: Safety/medical concerns  Assist level: Minimal  Assistance - Patient > 75% Assistive device: Walker-rolling    Walk 150 feet activity   Assist Walk 150 feet activity did not occur: Safety/medical concerns  Assist level: Minimal Assistance - Patient > 75% Assistive device: Walker-rolling    Walk 10 feet on uneven surface  activity   Assist Walk 10 feet on uneven surfaces activity did not occur: Safety/medical concerns         Wheelchair     Assist Is the patient using a wheelchair?: Yes Type of Wheelchair: Manual    Wheelchair assist level: Total Assistance - Patient < 25% (pt with poor ability to reach floor with foot for hemipropulsion) Max wheelchair distance: 137ft    Wheelchair 50 feet with 2 turns activity    Assist        Assist Level: Total Assistance - Patient < 25%   Wheelchair 150 feet activity     Assist      Assist Level: Total Assistance - Patient < 25%   Blood pressure (!) 147/61, pulse (!) 56, temperature 98.1 F (36.7 C), resp. rate 17, height 5' 4.02" (1.626 m), weight 74.6 kg, SpO2 100 %.  Medical Problem List and Plan: 1. 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    -WC has been ordered  -She has R AFO with partial build up heel lift and toe cap- helping with R foot clearance 2. DVT/anticoagulation: SCDs             -antiplatelet therapy: N/A 3. Pain Management: Tylenol as needed  -occasionally has sensory-related discomfort on right d/t ICH 4. Mood/Sleep/ADHD: Ritalin 5mg  twice daily seems to be effective             -antipsychotic agents: N/A 5. Neuropsych/cognition: This patient is not yet capable of making decisions on her own behalf. 6. Skin/Wound Care: Routine skin checks 7. Fluids/Electrolytes/Nutrition: encouraging PO  -intake improved. Family bringing in food  -on regular diet  -BMET  7/11 was stable, BUN and Scr improved, continue to encourage fluid intake 8.  Hypertension.  Avapro 300 mg daily, Bystolic 5 mg daily.      -can't push beta blocker any more d/t bradycardia  - occasionally mildly elevated but well controlled overall Vitals:   11/27/21 2000 11/28/21 0421  BP: 132/81 (!) 147/61  Pulse: (!) 56 (!) 56  Resp: 17 17  Temp: 98 F (36.7 C) 98.1 F (36.7 C)  SpO2: 100% 100%    9.  Hyperlipidemia.  Lipitor, encourage healthy diet 10. Vestibular 11/30/21 d/t ICH---better since initial episode last week  -gaze stabilization exercises provided by therapy  -  meclizine prn.   -7/14 improved, has not  needed recent meclizine 11. Headache-resolved  -resolved, Fioricet prn  -7/14 no further HA reported     LOS: 20 days A FACE TO FACE EVALUATION WAS PERFORMED  Fanny Dance 11/28/2021, 8:46 AM

## 2021-11-29 NOTE — Progress Notes (Signed)
Patient alert and oriented with no complaints or noted acute distress, resting. Refuse SCD's, continue to educate on treatment., Encourage po's.  [id number][35 at 0700 Patient anticipates discharge home today, in good spirits. Family at bedside

## 2021-11-29 NOTE — Progress Notes (Signed)
Patient and family verbalized understanding of discharge instructions and escorted to car by NT with all belongings

## 2021-12-01 ENCOUNTER — Ambulatory Visit: Payer: BLUE CROSS/BLUE SHIELD | Admitting: Speech Pathology

## 2021-12-01 ENCOUNTER — Ambulatory Visit: Payer: BLUE CROSS/BLUE SHIELD

## 2021-12-01 ENCOUNTER — Ambulatory Visit: Payer: BLUE CROSS/BLUE SHIELD | Admitting: Occupational Therapy

## 2021-12-10 NOTE — Therapy (Incomplete)
OUTPATIENT OCCUPATIONAL THERAPY NEURO EVALUATION  Patient Name: Denise Herrera MRN: [id number] DOB:October 24, 1970, 51 y.o., female Today's Date: 12/11/2021  PCP: Dr. Antony Contras REFERRING PROVIDER: Cathlyn Parsons, PA-C    OT End of Session - 12/11/21 1338     Visit Number 1    Number of Visits 25    Date for OT Re-Evaluation 03/11/22    Authorization Type [employer], covered 100% (OOP met), visit limit--[place]    OT Start Time 1235    OT Stop Time 1316    OT Time Calculation (min) 41 min    Activity Tolerance Patient tolerated treatment well    Behavior During Therapy WFL for tasks assessed/performed             Past Medical History:  Diagnosis Date   Chronic insomnia    History of migraine headaches    Narcolepsy without cataplexy(347.00)    Past Surgical History:  Procedure Laterality Date   CESAREAN SECTION     gyn cervical procedure     Patient Active Problem List   Diagnosis Date Noted   ICH (intracerebral hemorrhage) ([place]) 11/03/2021   Essential hypertension 09/05/2015   INSOMNIA, CHRONIC 03/01/2007   MIGRAINE HEADACHE 03/01/2007   Narcolepsy without cataplexy 03/01/2007    ONSET DATE: 11/05/21  REFERRING DIAG: I61.0 (ICD-10-CM) - Nontraumatic subcortical hemorrhage of left cerebral hemisphere ([place])   THERAPY DIAG:  Hemiplegia and hemiparesis following nontraumatic intracerebral hemorrhage affecting right dominant side ([place]) - Plan: Ot plan of care cert/re-cert  Other lack of coordination - Plan: Ot plan of care cert/re-cert  Unsteadiness on feet - Plan: Ot plan of care cert/re-cert  Visuospatial deficit - Plan: Ot plan of care cert/re-cert  Other disturbances of skin sensation - Plan: Ot plan of care cert/re-cert  Rationale for Evaluation and Treatment Rehabilitation  SUBJECTIVE:   SUBJECTIVE STATEMENT: "I need to be able to take care of my house and get back to work"  Pt accompanied by: significant other present for first half of eval (then had to  leave for work)  PERTINENT HISTORY: 11/05/2021 with acute onset of right-sided weakness and aphasia. Blood pressure mildly elevated 141/85. CT of the head showed acute parenchymal hemorrhage in the left corona radiata with intraventricular extension.  Hospital d/c 11/28/21.  PMH:  history of ADHD maintained on Ritalin, migraine headaches, hypertension, hyperlipidemia, hx of narcolepsy without cataplex, Hx of R RTC repair 03/2021.     PRECAUTIONS: Fall  WEIGHT BEARING RESTRICTIONS No  PAIN:  Are you having pain? No  FALLS: Has patient fallen in last 6 months? 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, accounting, physically, lifting up to 50lbs), and Leisure: sleep , shopping/thrift shopping, dance, skate   PATIENT GOALS   get back to doing things around the house, returning to work   OBJECTIVE:   HAND DOMINANCE: Right  ADLs: Transfers/ambulation related to ADLs: mod I  Eating: approx 45% with R hand, unable to cut food Grooming: mod I with LUE or BUEs (LUE guiding) UB Dressing: mod I, husband may straighten bra prn LB Dressing: mod I Toileting: mod I  Bathing: mod I Tub Shower transfers: mod I Equipment: Shower seat with back and Walk in shower   IADLs: Shopping: pt performed prior; pt has not performed since d/c Light housekeeping: pt performed most prior; pt is not currently performing Meal Prep: husband did most cooking prior; mod  I with cold meal prep and microwave Community mobility: not driving currently (was independent prior) Medication management: mod I Financial management: pt has always performed--pt reports that she has resumed Handwriting:  difficulty  MOBILITY STATUS:  mod I with RW    FUNCTIONAL OUTCOME MEASURES: FOTO: Not yet completed--requested front desk have pt complete after ST and PT evals  UPPER EXTREMITY  ROM   RUE grossly WNL except R shoulder flexion 130* and approx 90% shoulder ER/IR   UPPER EXTREMITY MMT:     MMT Right eval Left eval  Shoulder flexion 3+/5   Shoulder abduction 3+/5   Shoulder adduction 3+/5   Shoulder extension    Shoulder internal rotation    Shoulder external rotation    Middle trapezius    Lower trapezius    Elbow flexion 4-/5   Elbow extension 4-/5   Wrist flexion    Wrist extension    Wrist ulnar deviation    Wrist radial deviation    Wrist pronation    Wrist supination    (Blank rows = not tested)  HAND FUNCTION: Grip strength: Right: 18 lbs; Left: 48.7 lbs  COORDINATION: 9 Hole Peg test: Right: 1 peg in within 2 min; Left: 20.69 sec Box & Blocks Test:  R-34blocks, L-64blocks  SENSATION: Decr sensation in RUE/RLE  (numbness, decr hot/cold sensation)  COGNITION: Overall cognitive status: Impaired  Pt/husband reports mild difficulty with memory and Word finding difficulties.  Will assess further in functional context prn  VISION: Subjective report: Pt reports blurriness R eye in peripheral vision (not wearing glasses today). Baseline vision: Bifocals--wore for driving, but did not wear regularly and was not wearing today  VISION ASSESSMENT: Eye alignment: WFL Ocular ROM: WFL Tracking/Visual pursuits: Able to track stimulus in all quads without difficulty Saccades: WFL Convergence: WFL Pt completed 4 lines of visual scanning/number cancellation sheet with good visual accuracy, but did not complete due to mod difficulty/incr time for accuracy with marking with R hand.   TODAY'S TREATMENT:    Eval completed    PATIENT EDUCATION: Education details: OT eval results and POC Person educated: Patient Education method: Explanation Education comprehension: verbalized understanding   HOME EXERCISE PROGRAM: Not yet issued    GOALS: Potential Goals reviewed with patient? Yes  SHORT TERM GOALS: Target date: 01/11/22  Pt will be  independent with initial HEP. Goal status: INITIAL  2.  Pt will improve dominant RUE coordination/functional reaching for ADLs as shown by improving score on box and blocks test by at least 8. Baseline: 34 blocks Goal status: INITIAL  3.  Pt will use RUE as dominant UE for eating and grooming 100% of the time. Goal status: INITIAL  4.  Pt will perform simple home maintenance tasks with supervision. Goal status: INITIAL  5.  Pt will perform environmental scanning/navigation with at least 95% accuracy in busy environment. Goal status: INITIAL  6.  Pt will be able to write at least 2 sentences with good legibility in reasonable amount of time. Goal status: INITIAL  LONG TERM GOALS: Target date: 03/11/22  Pt will be independent with updated HEP Goal status: INITIAL  2.  Pt will improve R hand coordination for ADLs as shown by completing 9-hole peg test in less than 90sec. Baseline: only able to put in 1 peg within 74mn Goal status: INITIAL  3.  Pt will use RUE as dominant UE for eating and grooming at least 75% of the time. Goal status: INITIAL  4.  Pt will perform  simple-mod complex home maintenance tasks mod I. Goal status: INITIAL  5.  Pt will improve RUE strength to be be able to retrieve 3lb object from overhead shelf safely x3. Goal status: INITIAL  6.  Pt will improve R grip strength to at least 30lbs to assist in lifting objects/opening containers. Goal status: INITIAL  7.  Pt will perform environmental scanning/navigation in busy environment with at least 95% accuracy for incr safety.   Goal status: INITIAL  ASSESSMENT:  CLINICAL IMPRESSION: Patient is a 51 y.o. female who was seen today for occupational therapy evaluation who presented 11/05/2021 with acute onset of right-sided weakness and aphasia. CT of the head showed acute parenchymal hemorrhage in the left corona radiata with intraventricular extension.  Hospital d/c 11/28/21.  PMH includes:  history of ADHD  maintained on Ritalin, migraine headaches, hypertension, hyperlipidemia, hx of narcolepsy without cataplex, Hx of R RTC repair 03/2021.  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  Pt presents today with decr sensation, R hemiparesis, decr coordination, decr balance for ADLs/IADLs.  Pt would benefit from occupational therapy to address these deficits for improved dominant RUE functional use, and incr ease and independence with ADLs/IADLs.   PERFORMANCE DEFICITS in functional skills including ADLs, IADLs, coordination, dexterity, sensation, ROM, strength, FMC, GMC, mobility, balance, decreased knowledge of precautions, decreased knowledge of use of DME, vision, and UE functional use, cognitive skills including memory, and psychosocial skills including habits.   IMPAIRMENTS are limiting patient from ADLs, IADLs, work, leisure, and social participation.   COMORBIDITIES may have co-morbidities  that affects occupational performance. Patient will benefit from skilled OT to address above impairments and improve overall function.  MODIFICATION OR ASSISTANCE TO COMPLETE EVALUATION: Min-Moderate modification of tasks or assist with assess necessary to complete an evaluation.  OT OCCUPATIONAL PROFILE AND HISTORY: Detailed assessment: Review of records and additional review of physical, cognitive, psychosocial history related to current functional performance.  CLINICAL DECISION MAKING: Moderate - several treatment options, min-mod task modification necessary  REHAB POTENTIAL: Good  EVALUATION COMPLEXITY: Moderate    PLAN: OT FREQUENCY: 2x/week  OT DURATION: 12 weeks +eval  PLANNED INTERVENTIONS: self care/ADL training, therapeutic exercise, therapeutic activity, neuromuscular re-education, manual therapy, passive range of motion, balance training, functional mobility training, aquatic therapy, splinting, electrical stimulation, ultrasound, paraffin, fluidotherapy, moist  heat, cryotherapy, patient/family education, cognitive remediation/compensation, visual/perceptual remediation/compensation, energy conservation, and DME and/or AE instructions  RECOMMENDED OTHER SERVICES: none at this time  CONSULTED AND AGREED WITH PLAN OF CARE: Patient  PLAN FOR NEXT SESSION: coordination HEP; neuro re-ed RUE   Denise Herrera, OTR/L 12/11/2021, 3:11 PM

## 2021-12-11 ENCOUNTER — Encounter: Payer: Self-pay | Admitting: Physical Therapy

## 2021-12-11 ENCOUNTER — Ambulatory Visit: Payer: BLUE CROSS/BLUE SHIELD | Admitting: Occupational Therapy

## 2021-12-11 ENCOUNTER — Encounter: Payer: Self-pay | Admitting: Occupational Therapy

## 2021-12-11 ENCOUNTER — Ambulatory Visit: Payer: BLUE CROSS/BLUE SHIELD | Attending: Physician Assistant | Admitting: Physical Therapy

## 2021-12-11 ENCOUNTER — Ambulatory Visit: Payer: BLUE CROSS/BLUE SHIELD

## 2021-12-11 VITALS — BP 161/79 | HR 50

## 2021-12-11 DIAGNOSIS — M6281 Muscle weakness (generalized): Secondary | ICD-10-CM | POA: Diagnosis not present

## 2021-12-11 DIAGNOSIS — R278 Other lack of coordination: Secondary | ICD-10-CM | POA: Diagnosis not present

## 2021-12-11 DIAGNOSIS — R41842 Visuospatial deficit: Secondary | ICD-10-CM

## 2021-12-11 DIAGNOSIS — I69153 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: Secondary | ICD-10-CM | POA: Diagnosis not present

## 2021-12-11 DIAGNOSIS — R208 Other disturbances of skin sensation: Secondary | ICD-10-CM | POA: Diagnosis not present

## 2021-12-11 DIAGNOSIS — I69151 Hemiplegia and hemiparesis following nontraumatic intracerebral hemorrhage affecting right dominant side: Secondary | ICD-10-CM | POA: Diagnosis not present

## 2021-12-11 DIAGNOSIS — R4701 Aphasia: Secondary | ICD-10-CM

## 2021-12-11 DIAGNOSIS — R2681 Unsteadiness on feet: Secondary | ICD-10-CM

## 2021-12-11 DIAGNOSIS — I610 Nontraumatic intracerebral hemorrhage in hemisphere, subcortical: Secondary | ICD-10-CM | POA: Insufficient documentation

## 2021-12-11 DIAGNOSIS — R41841 Cognitive communication deficit: Secondary | ICD-10-CM

## 2021-12-11 DIAGNOSIS — R2689 Other abnormalities of gait and mobility: Secondary | ICD-10-CM | POA: Insufficient documentation

## 2021-12-11 NOTE — Therapy (Signed)
OUTPATIENT PHYSICAL THERAPY NEURO EVALUATION   Patient Name: Denise Herrera MRN: [id number] DOB:07/01/1970, 51 y.o., female Today's Date: 12/11/2021   PCP: Tally Joe, MD REFERRING PROVIDER: Charlton Amor, PA-C    PT End of Session - 12/11/21 1408     Visit Number 1    Number of Visits 13   12+eval   Date for PT Re-Evaluation 02/06/22   pushed out due to scheduling delay   Authorization Type [employer]    PT Start Time 1401    PT Stop Time 1445    PT Time Calculation (min) 44 min    Equipment Utilized During Treatment Gait belt   R Posterior AFO   Activity Tolerance Patient tolerated treatment well    Behavior During Therapy Agitated             Past Medical History:  Diagnosis Date   Chronic insomnia    History of migraine headaches    Narcolepsy without cataplexy(347.00)    Past Surgical History:  Procedure Laterality Date   CESAREAN SECTION     gyn cervical procedure     Patient Active Problem List   Diagnosis Date Noted   ICH (intracerebral hemorrhage) ([facility]) 11/03/2021   Essential hypertension 09/05/2015   INSOMNIA, CHRONIC 03/01/2007   MIGRAINE HEADACHE 03/01/2007   Narcolepsy without cataplexy 03/01/2007    ONSET DATE: 11/25/2021 (referral)  REFERRING DIAG: I61.0 (ICD-10-CM) - Nontraumatic subcortical hemorrhage of left cerebral hemisphere ([facility])   THERAPY DIAG:  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  My legs are getting stronger and my  balance isn't too bad, it's getting better with each day." Pt accompanied by: self and significant other-husband Iantha Fallen  PERTINENT HISTORY: ADHD, migraines, HTN, hyperlipidemia  Presented 11/05/2021 with acute onset of right-sided weakness and aphasia.  Left CVA/ICH confirmed. Admitted to rehab on 11/08/2021 and discharged 11/29/2021.  PAIN:  Are you having pain? No  PRECAUTIONS: Fall  WEIGHT BEARING RESTRICTIONS No  FALLS: Has patient fallen in last 6 months? No  LIVING ENVIRONMENT: Lives with: lives with their family and lives with their spouse-2 sons and 2 grandkids Lives in: House/apartment Stairs: Yes: Internal: 14  steps; none and External: 3 steps; none - pt reports she was remodeling and took the indoor rail down, she has since gone up and down those stairs without issue as well as the steps outside Has following equipment at home: Walker - 2 wheeled, Wheelchair (manual), and shower chair  PLOF: Requires assistive device for independence-RW for most walking  PATIENT GOALS "To walk without RW."  OBJECTIVE:   DIAGNOSTIC FINDINGS: CT of the head showed acute parenchymal hemorrhage in the left corona radiata with intraventricular extension.  CT angiogram head and neck no aneurysm or AVM. MRI 11/04/2021 parenchymal hemorrhage of the left thalamus and adjacent white matter with intraventricular extension.  COGNITION: Overall cognitive status:  pt reports mild memory decline since stroke, but nothing of concern.   SENSATION: N/T in right toes WFL to light touch  COORDINATION: Heel-to-shin:  WFL bilaterally  EDEMA:  None noted bilaterally.  MUSCLE TONE: none noted bilaterally.  POSTURE: No Significant postural limitations  LOWER EXTREMITY ROM:     Active  Right Eval Left Eval  Hip flexion [facility]  Hip extension    Hip abduction " "  Hip adduction " "  Hip internal rotation    Hip external rotation    Knee flexion " "  Knee extension " "  Ankle dorsiflexion  Limited to ~neutral w/ excessive toe extension "  Ankle plantarflexion    Ankle inversion    Ankle eversion     (Blank rows = not tested)  LOWER EXTREMITY MMT:    MMT Right Eval Left Eval  Hip flexion 3+/5 4+/5  Hip extension    Hip abduction 3+/5 4+/5  Hip adduction 3+/5 5/5  Hip internal rotation    Hip external rotation    Knee flexion 4/5 5/5  Knee extension 4-/5 4+/5  Ankle dorsiflexion 2+/5 5/5  Ankle plantarflexion    Ankle inversion    Ankle eversion    (Blank rows = not tested)  BED MOBILITY:  Sit to supine Complete Independence Supine to sit Complete Independence  TRANSFERS: Assistive device utilized: Environmental consultant - 2 wheeled  Sit to stand: SBA Stand to sit: SBA Chair to chair: SBA  STAIRS:  Level of Assistance: SBA  Stair Negotiation Technique: Step to Pattern Sideways Forwards With use of AD: RW  with No Rails  Number of Stairs: 4   Height of Stairs: 6"  Comments: Pt demos how she uses half wall in home to guide her in staggered step-to stance ascending w/ supervision at home.  She is able to use the wall on the opposite side at home w/ husband holding RW in front (pt independent in RW management) to descend.  GAIT: Gait pattern: step to pattern, decreased stance time- Right, decreased stride length, decreased hip/knee flexion- Right, decreased ankle dorsiflexion- Right, and poor foot clearance- Right Distance walked: 254' (total) Assistive device utilized: Environmental consultant - 2 wheeled Level of assistance: SBA Comments: Pt ambulates w/ ER of RLE using posterior AFO.  Intermittent heel scuff on level ground.  FUNCTIONAL TESTs:  5 times sit to stand: 17.03 sec w/ LUE support 2 minute walk test: 235' w/ RW 10 meter walk test: 17.40sec =  0.57 m/sec OR 1.90 ft/sec  PATIENT SURVEYS:  FOTO To be assessed next session.  TODAY'S TREATMENT:  N/A  PATIENT EDUCATION: Education details: PT POC, assessments used, goals set. Person educated: Patient Education method:  Explanation Education comprehension: verbalized understanding   HOME EXERCISE PROGRAM: To be established.  GOALS: Goals reviewed with  patient? Yes  SHORT TERM GOALS: Target date: 01/02/2022  Pt will be independent with strength and balance HEP with supervision from family as needed. Baseline:  To be established. Goal status: INITIAL  2.  Pt will navigate 4 stairs w/o handrails reciprocally at supervision level to improve safety with functional mobility in her home. Baseline:  step-to using lean against half wall/rail Goal status: INITIAL  3.  Pt will ambulate >/=375 feet over indoor surfaces with LRAD and supervision level of assist to promote household and community access. Baseline: 254' SBA RW Goal status: INITIAL  4.  Pt will decrease 5xSTS to </=13 seconds in order to demonstrate decreased risk for falls and improved functional bilateral LE strength and power. Baseline: 17.03 sec w/ LUE Goal status: INITIAL  5.  Pt will ambulate>/=300 feet on 2MWT to demonstrate improved endurance for functional tasks in home and community. Baseline: 52' w/ RW Goal status: INITIAL  6.  Pt will demonstrate a gait speed of >/=2.20 feet/sec in order to decrease risk for falls. Baseline: 1.90 ft w/ RW Goal status: INITIAL  LONG TERM GOALS: Target date: 01/23/2022  FOTO to be assessed w/ goal set as appropriate. Baseline: To be assessed. Goal status: INITIAL  2.  Pt will navigate 16 stairs using no rails supervision level reciprocally to improve safe functional mobility in home environment. Baseline: 4 step-to leaning against half wall/rail Goal status: INITIAL  3.  Pt will ambulate >/=750 feet with LRAD and ModI level of assist over various indoor and outdoor surfaces to promote household and community access. Baseline: 254' SBA RW Goal status: INITIAL  4.  Pt will demonstrate a gait speed of >/=2.4 feet/sec in order to decrease risk for falls. Baseline: 1.90 ft/sec Goal status:  INITIAL  ASSESSMENT:  CLINICAL IMPRESSION: Patient is a 51 y.o. female who was seen today for physical therapy evaluation and treatment for CVA involving the left thalamus and adjacent white matter.  Pt has a significant PMH of ADHD, migraines, HTN, and hyperlipidemia.  Identified impairments include difficulty navigating stairs, N/T in right toes, mild memory decline, right foot drop, and right weakness.  Evaluation via the following assessment tools: 5xSTS, 10MWT, and 2MWT indicate fall risk.  She would benefit from skilled PT to address impairments as noted and progress towards long term goals.  OBJECTIVE IMPAIRMENTS Abnormal gait, decreased balance, decreased cognition, decreased endurance, and decreased strength.   ACTIVITY LIMITATIONS carrying, lifting, and locomotion level  PARTICIPATION LIMITATIONS: driving, community activity, occupation, and yard work  PERSONAL FACTORS Age, Fitness, Transportation, and 1 comorbidity: HTN  are also affecting patient's functional outcome.   REHAB POTENTIAL: Excellent  CLINICAL DECISION MAKING: Stable/uncomplicated  EVALUATION COMPLEXITY: Low  PLAN: PT FREQUENCY: 2x/week  PT DURATION: 6 weeks  PLANNED INTERVENTIONS: Therapeutic exercises, Therapeutic activity, Neuromuscular re-education, Balance training, Gait training, Patient/Family education, Self Care, Stair training, Vestibular training, DME instructions, Electrical stimulation, Biofeedback, and Re-evaluation  PLAN FOR NEXT SESSION: Have pt complete FOTO and update goal.  Initiate HEP for right sided strength and balance.  Gait training, stair training, pt may benefit from Bioness in the future.   Bary Richard, PT, DPT 12/11/2021, 5:35 PM

## 2021-12-11 NOTE — Therapy (Signed)
OUTPATIENT SPEECH LANGUAGE PATHOLOGY EVALUATION   Patient Name: Denise Herrera MRN: [id number] DOB:October 23, 1970, 51 y.o., female Today's Date: 12/12/2021  PCP: Tally Joe MD REFERRING PROVIDER: Charlton Amor, PA-C    End of Session - 12/11/21 1302     Visit Number 1    Number of Visits 17    Date for SLP Re-Evaluation 02/06/22    Authorization Type [employer]    SLP Start Time 1320   late arrival from OT eval   SLP Stop Time  1400    SLP Time Calculation (min) 40 min    Activity Tolerance Patient tolerated treatment well             Past Medical History:  Diagnosis Date   Chronic insomnia    History of migraine headaches    Narcolepsy without cataplexy(347.00)    Past Surgical History:  Procedure Laterality Date   CESAREAN SECTION     gyn cervical procedure     Patient Active Problem List   Diagnosis Date Noted   ICH (intracerebral hemorrhage) (HCC) 11/03/2021   Essential hypertension 09/05/2015   INSOMNIA, CHRONIC 03/01/2007   MIGRAINE HEADACHE 03/01/2007   Narcolepsy without cataplexy 03/01/2007    ONSET DATE: 11/03/2021    REFERRING DIAG: I61.0 (ICD-10-CM) - Nontraumatic subcortical hemorrhage of left cerebral hemisphere   THERAPY DIAG:  Cognitive communication deficit  Aphasia  Rationale for Evaluation and Treatment Rehabilitation  SUBJECTIVE:   SUBJECTIVE STATEMENT: "It could always be better"  Pt accompanied by: self  PERTINENT HISTORY: [name] is a 51 year old right-handed female with history of ADHD maintained on Ritalin, migraine headaches as well as hypertension and hyperlipidemia.  Per chart review patient lives with spouse.  Two-level home bed and bath upstairs with 3 steps to entry.  Spouse works from 4-12 PM.  There is a son in the home who can provide assistance.  Patient independent prior to admission working full-time as a Production designer, theatre/television/film at [employer].  Presented 11/05/2021 with acute onset of right-sided weakness and aphasia.   Blood pressure mildly elevated 141/85.  CT of the head showed acute parenchymal hemorrhage in the left corona radiata and thalamocapsular region with intraventricular extension.  No significant mass effect or hydrocephalus.  CT angiogram head and neck no aneurysm or AVM.  MRI follow-up 11/04/2021 again showing parenchymal hemorrhage of the left thalamus and adjacent white matter with intraventricular extension.  Mild mass effect and edema.  PAIN: Are you having pain? No  FALLS: Has patient fallen in last 6 months?  No  LIVING ENVIRONMENT: Lives with: lives with their family Lives in: House/apartment  PLOF:  Level of assistance: Independent with ADLs, Independent with IADLs Employment: Full-time employment prior to hospitalization Emergency planning/management officer)  PATIENT GOALS "to be able to talk like I used to and be able to communicate what I need to"  OBJECTIVE:   COGNITION: Overall cognitive status: Impaired Areas of impairment:  Memory: Impaired: Working, Teacher, music term Awareness: Impaired: Emergent and Anticipatory Executive function: Impaired: Error awareness and Self-correction Functional deficits: Hx of ADHD; Pt endorsed mild memory changes since stroke. Limited completion of iADLS endorsed at this time so patient unable to comment upon many prior functional activities   EXPRESSION: verbal  VERBAL EXPRESSION: Level of generative/spontaneous verbalization: conversation Automatic speech: WFL Repetition: not tested Naming: Responsive: 76-100%, Confrontation: 76-100%, and Divergent: 51-75% Pragmatics: Appears intact Interfering components: attention Effective technique: open ended questions, semantic cues, and phonemic cues Non-verbal means of communication: N/A Comments: No overt anomia exhibited today;  however, some vague language and circumlocution noted in unstructured conversation. Delays and limited items verbalized during generative naming tasks. Convergent naming was WNL.   ORAL MOTOR  EXAMINATION Overall status: WFL  STANDARDIZED ASSESSMENTS: CLQT: Memory: Mild and Language: Mild (both were borderline moderate) Pt exhibited reduced/delayed emergent awareness with symbols missed and inaccurate number spacing on clock. Unable to correct independently.  Frustration expressed during generative naming with limited items identified  Difficulty following instructions for symbol trails (possible left neglect)    PATIENT REPORTED OUTCOME MEASURES (PROM): Cognitive Function: 131 (mostly rated never/no difficulty) "A little/rarely" = checking accuracy of financial documents, reading/following complex instructions, remembering where things were placed or put away, remembering a list of 4-5 items without writing it down, learning new tasks or instructions, reacted slowly to things said or done, thinking was slow "Sometimes" = words on tip of tongue   Communication Participation Item Bank: 19 "Quite a bit" = communicating quickly, giving detailed information "A little" = talking with familiar people, communicating in community, asking questions in conversation, having long conversation, communicating in small group, getting turn in fast moving conversation, trying to persuade someone "Not at all" = talking with people you do not know    TODAY'S TREATMENT:  12-12-21: Pt expressed some frustration and dislike of formal standardized testing, in which SLP provided rationale to assist with determining current deficits. Reduced intellectual and emergent awareness noted during assessment, which is likely contributing factor to reported feelings. Pt would like to focus on functional tasks and communication, in which SLP emphasized benefit of patient/family to identify deficits and personal goals to be targeted in ST sessions.    PATIENT EDUCATION: Education details: see above Person educated: Patient Education method: Medical illustrator Education comprehension: verbalized  understanding, returned demonstration, and needs further education   GOALS: Goals reviewed with patient? Yes  SHORT TERM GOALS: Target date: 01/08/2022    Pt will implement word retrieval strategies during functional naming tasks and/or structured conversation given occasional min A over 2 sessions Baseline: Goal status: INITIAL  2.  Pt will demonstrate increased emergent  and anticipatory awareness of errors during structured tasks related to home/work given occasional min A over 2 sessions  Baseline:  Goal status: INITIAL  3.  Pt will ID functional deficits and verbalize appropriate solutions to optimize performance given occasional min A over 2 sessions Baseline:  Goal status: INITIAL  4.  Pt will verbalize detailed synopsis of work related tasks given occasional min A over 2 sessions  Baseline:  Goal status: INITIAL   LONG TERM GOALS: Target date: 02/06/2022    Pt will implement word retrieval strategies during 25+ minute unstructured conversation with ability to provide detailed information on related topic given rare min A over 2 sessions Baseline:  Goal status: INITIAL  2.  Pt will verbalize and implement recommended cognitive compensations to optimize performance and task completion at home/work with rare min A over 2 sessions  Baseline:  Goal status: INITIAL  3.  Pt will demonstrate increased awareness of functional deficits with pt able to verbalize deficits, identify appropriate strategy, and implement strategy given rare min A over 2 sessions  Baseline:  Goal status: INITIAL  4.  Pt will report improved communication effectiveness via PROM by 2 points at last ST session Baseline: CPIB=19 Goal status: INITIAL    ASSESSMENT:  CLINICAL IMPRESSION: Patient is a 51 y.o. female who was seen today for CVA in June 2023. PHMX significant for ADHD. Today, pt reports "slight" memory  changes and ongoing word finding episodes s/p stroke. Pt unable to provide concrete  examples of memory and word retrieval deficits, other than names which reportedly have always been challenging. Pt has yet to return to functional household or work related tasks with significant anticipation demonstrated. Standardized testing revealed mild-moderate memory and language deficits. Inconsistent awareness of errors exhibited on subtests, with suspected reduced recall of instructions noted x1 (symbol trials). Frustration and dislike of standardized testing exhibited, which could be related to reduced awareness. Conversational speech c/b vague language and circumlocution, with no overt anomia or dysnomia noted today. Skilled ST is recommended to address expressive language and cognitive deficits to optimize return to functional activities and work.   OBJECTIVE IMPAIRMENTS include memory, awareness, executive functioning, and aphasia. These impairments are limiting patient from return to work, household responsibilities, and effectively communicating at home and in community. Factors affecting potential to achieve goals and functional outcome are cooperation/participation level and previous level of function. Patient will benefit from skilled SLP services to address above impairments and improve overall function.  REHAB POTENTIAL: Good  PLAN: SLP FREQUENCY: 2x/week  SLP DURATION: 8 weeks  PLANNED INTERVENTIONS: Language facilitation, Cueing hierachy, Cognitive reorganization, Internal/external aids, Functional tasks, Multimodal communication approach, SLP instruction and feedback, Compensatory strategies, and Patient/family education    Gracy Racer, CCC-SLP 12/12/2021, 8:09 AM

## 2021-12-11 NOTE — Addendum Note (Signed)
Addended by: Willa Frater D on: 12/11/2021 03:17 PM   Modules accepted: Orders

## 2021-12-16 ENCOUNTER — Encounter: Payer: BLUE CROSS/BLUE SHIELD | Admitting: Registered Nurse

## 2021-12-17 ENCOUNTER — Ambulatory Visit: Payer: BLUE CROSS/BLUE SHIELD | Admitting: Physical Therapy

## 2021-12-17 ENCOUNTER — Encounter: Payer: BLUE CROSS/BLUE SHIELD | Attending: Registered Nurse | Admitting: Registered Nurse

## 2021-12-17 ENCOUNTER — Encounter: Payer: Self-pay | Admitting: Registered Nurse

## 2021-12-17 ENCOUNTER — Ambulatory Visit: Payer: BLUE CROSS/BLUE SHIELD

## 2021-12-17 VITALS — BP 166/106 | HR 68 | Ht 64.0 in | Wt 170.6 lb

## 2021-12-17 DIAGNOSIS — E7849 Other hyperlipidemia: Secondary | ICD-10-CM | POA: Insufficient documentation

## 2021-12-17 DIAGNOSIS — I10 Essential (primary) hypertension: Secondary | ICD-10-CM | POA: Diagnosis not present

## 2021-12-17 DIAGNOSIS — F908 Attention-deficit hyperactivity disorder, other type: Secondary | ICD-10-CM | POA: Insufficient documentation

## 2021-12-17 DIAGNOSIS — I610 Nontraumatic intracerebral hemorrhage in hemisphere, subcortical: Secondary | ICD-10-CM | POA: Diagnosis not present

## 2021-12-17 MED ORDER — IRBESARTAN 300 MG PO TABS: 300.0000 mg | ORAL_TABLET | Freq: Every day | ORAL | 0 refills | Status: AC

## 2021-12-17 NOTE — Progress Notes (Unsigned)
Subjective:    Patient ID: Denise Herrera, female    DOB: May 06, 1971, 52 y.o.   MRN: [id number]  HPI: Denise Herrera is a 51 y.o. female who is here for HFU appointment for follow up of her ICH ( Intracerebral Hemorrhage) Essential Hypertension, Hyperlipidemia, ADHD and Uncontrolled Hypertension.  Denise Herrera presented to [facility] on 11/05/2021 with acute onset of right sided weakness and aphasia.  Dr Pearlean Brownie H&P Note: 11/03/2021 CC: Code stroke with aphasia and right sided weakness   History is obtained from:family, chart and EMS   HPI: Denise Herrera is a 51 y.o. female with a history of hypertension who was at work today and called her husband at 1330 complaining of right sided numbness.  Her co-workers say this was the last time they saw her in a normal state.  She then went to the bathroom at work, developed right sided weakness and aphasia and eventually was able to call for help.  On arrival to the ED, she has expressive aphasia but responds to her name and is able to follow commands.  RUE is flaccid and RLE has 2/5 strength.  She has no history of anticoagulant use.  Blood pressure was only mildly elevated at 141/85.  CT Head WO Contrast: IMPRESSION: Acute parenchymal hemorrhage in the left corona radiata and thalamocapsular region with intraventricular extension. No significant mass effect or hydrocephalus.   CTA: IMPRESSION: No aneurysm, AVM, or other significant vascular abnormality.  MR Brain:  IMPRESSION: Acute parenchymal hemorrhage with involvement of the left thalamus and adjacent white matter with intraventricular extension. Mild edema and mass effect. Limited evaluation for underlying lesion this noncontrast study. No evidence of prior hemorrhage.  Denise Herrera was admitted to inpatient rehabilitation on 11/08/2021 and discharged home on 11/29/2021. She will be starting Neuro-Rehabilitation on 12/18/2021. She denies any pain. She rates her pain 0. Also reports she has a  good appetite.   Denise Herrera arrived to office with uncontrolled hypertension, blood pressure was re-checked several times. Placed a call to Dr Azucena Cecil office, he wasn't in the office, spoke with Joni Reining CMA, stating she will send a message to the on call doctor, Dr Cliffton Asters.Awaiting a return callfrom Dr Cliffton Asters.  Call was placed to [facility], spoke with inpatient pharmacist , she stets Denise Herrera was prescribed Avapro and Bystolic while hospitalized. She was prescribed Avapro 150 mg on June 22nd, it was increased to 300 mg on June 24th, 2023. Her Telmisartan not on Drug Formulary at [facility]. Denise Herrera reports side effects with Telmisartan when she was discharged , stating she had dizziness and didn't feel like herself. She also reports she had reported theses symptoms to her PCP in the past. She states she hasn't taken the Telmisartan in a week. She refuses ED evaluation.  The above discussed with Dr Riley Kill, he agrees with prescribing Avapro 300 mg daily. Denise Herrera instructed to call her PCP in the morning and to keep a blood pressure log, she verbalizes understanding. Educated on medication compliance. She still refusing to go the Emergency room for evaluation.   Pain Inventory Average Pain 0 Pain Right Now 0 My pain is  no pain  LOCATION OF PAIN  no pain  BOWEL Number of stools per week: 3   BLADDER Normal I   Mobility walk without assistance use a walker ability to climb steps?  yes do you drive?  no transfers alone Do you have any goals in this area?  yes  Function employed # of hrs/week NA  Neuro/Psych weakness numbness trouble walking  Prior Studies Any changes since last visit?  no  Physicians involved in your care Any changes since last visit?  no   Family History  Problem Relation Age of Onset   Asthma Mother    Hypertension Mother    Cancer Mother        breast cancer   Hypertension Maternal Grandfather    Diabetes Maternal Grandfather     Hypertension Maternal Grandmother    Social History   Socioeconomic History   Marital status: Married    Spouse name: Not on file   Number of children: 3   Years of education: Not on file   Highest education level: Not on file  Occupational History   Occupation: makes medical hose and prostheses  Tobacco Use   Smoking status: Never   Smokeless tobacco: Never  Substance and Sexual Activity   Alcohol use: No    Alcohol/week: 0.0 standard drinks of alcohol   Drug use: No   Sexual activity: Not on file  Other Topics Concern   Not on file  Social History Narrative   Not on file   Social Determinants of Health   Financial Resource Strain: Not on file  Food Insecurity: Not on file  Transportation Needs: Not on file  Physical Activity: Not on file  Stress: Not on file  Social Connections: Not on file   Past Surgical History:  Procedure Laterality Date   CESAREAN SECTION     gyn cervical procedure     Past Medical History:  Diagnosis Date   Chronic insomnia    History of migraine headaches    Narcolepsy without cataplexy(347.00)    BP (!) 165/102   Pulse 62   Ht 5\' 4"  (1.626 m)   Wt 170 lb 9.6 oz (77.4 kg)   SpO2 97%   BMI 29.28 kg/m   Opioid Risk Score:   Fall Risk Score:  `1  Depression screen [facility] 2/9     12/17/2021    2:24 PM  Depression screen PHQ 2/9  Decreased Interest 0  Down, Depressed, Hopeless 0  PHQ - 2 Score 0  Altered sleeping 0  Tired, decreased energy 0  Change in appetite 0  Feeling bad or failure about yourself  0  Trouble concentrating 0  Moving slowly or fidgety/restless 0  Suicidal thoughts 0  PHQ-9 Score 0      Review of Systems  Cardiovascular:  Positive for leg swelling.  All other systems reviewed and are negative.     Objective:   Physical Exam Vitals and nursing note reviewed.  Constitutional:      Appearance: Normal appearance.  Cardiovascular:     Rate and Rhythm: Normal rate and regular rhythm.     Pulses:  Normal pulses.     Heart sounds: Normal heart sounds.  Pulmonary:     Effort: Pulmonary effort is normal.     Breath sounds: Normal breath sounds.  Musculoskeletal:     Cervical back: Normal range of motion and neck supple.     Comments: Normal Muscle Bulk and Muscle Testing Reveals:  Upper Extremities: Full ROM and Muscle Strength on Right 4/5 and Left 5/5  Lower Extremities: Full ROM and Muscle Strength 5/5 Wearing Right AFO  Arises from Table Slowly using walker for support    Skin:    General: Skin is warm and dry.  Neurological:     Mental Status: She is alert  and oriented to person, place, and time.  Psychiatric:        Mood and Affect: Mood normal.        Behavior: Behavior normal.         Assessment & Plan:  ICH ( Intracerebral Hemorrhage): Call placed to [facility]. Message left for them to call Denise Herrera for HFU appointment. She will begin therapy on 12/18/2021. Continue to Monitor.   Essential Hypertension,: Continue Current medication regimen with Bystolic and Avapro. She was instructed to keep blood pressure log and to call her PCP in the morning. She verbalizes understanding.   Hyperlipidemia: Continue current medication regimen. PCP following. Continue to monitor.  ,ADHD: Continue Ritalin> Continue to Monitor.  Uncontrolled Hypertension. Blood Pressure rechecked several times. She refuses ED evaluation. Denise Herrera states she hasn't taken the Temilsartan in a week, she didn't call her PCP to report adverse effects. Call was placed to inpatient pharmacist. Call placed to her PCP Dr Azucena Cecil, he wasn;t in the office, waiting a return call from Dr Cliffton Asters.  Spoke with Dr Riley Kill regarding the above, we will resume her Avapro. Ms. larna capelle. This provider called [facility], they were filling her Avapro, Ms. Eugenio states she will go straight to pharmacy to pick up her medication.   F/U with Dr Riley Kill in 4- 6 weeks

## 2021-12-17 NOTE — Therapy (Deleted)
OUTPATIENT SPEECH LANGUAGE PATHOLOGY TREATMENT NOTE   Patient Name: Denise Herrera MRN: [id number] DOB:[birth year], 51 y.o., female Today's Date: 12/17/2021  PCP: Tally Joe MD REFERRING PROVIDER: Charlton Amor, PA-C   END OF SESSION:    Past Medical History:  Diagnosis Date   Chronic insomnia    History of migraine headaches    Narcolepsy without cataplexy(347.00)    Past Surgical History:  Procedure Laterality Date   CESAREAN SECTION     gyn cervical procedure     Patient Active Problem List   Diagnosis Date Noted   ICH (intracerebral hemorrhage) (HCC) 11/03/2021   Essential hypertension 09/05/2015   INSOMNIA, CHRONIC 03/01/2007   MIGRAINE HEADACHE 03/01/2007   Narcolepsy without cataplexy 03/01/2007    ONSET DATE: 11/03/2021    REFERRING DIAG: I61.0 (ICD-10-CM) - Nontraumatic subcortical hemorrhage of left cerebral hemisphere   THERAPY DIAG:  No diagnosis found.  Rationale for Evaluation and Treatment Rehabilitation  SUBJECTIVE: ***  PAIN:  Are you having pain? {yes/no:20286} NPRS scale: ***/10 Pain location: *** Pain orientation: {Pain Orientation:25161}  PAIN TYPE: {type:313116} Pain description: {PAIN DESCRIPTION:21022940}  Aggravating factors: *** Relieving factors: ***    OBJECTIVE:  TODAY'S TREATMENT:  12-17-21:   12-12-21: Pt expressed some frustration and dislike of formal standardized testing, in which SLP provided rationale to assist with determining current deficits. Reduced intellectual and emergent awareness noted during assessment, which is likely contributing factor to reported feelings. Pt would like to focus on functional tasks and communication, in which SLP emphasized benefit of patient/family to identify deficits and personal goals to be targeted in ST sessions.      PATIENT EDUCATION: Education details: see above Person educated: Patient Education method: Medical illustrator Education comprehension: verbalized  understanding, returned demonstration, and needs further education     GOALS: Goals reviewed with patient? Yes   SHORT TERM GOALS: Target date: 01/08/2022     Pt will implement word retrieval strategies during functional naming tasks and/or structured conversation given occasional min A over 2 sessions Baseline: Goal status: ongoing   2.  Pt will demonstrate increased emergent  and anticipatory awareness of errors during structured tasks related to home/work given occasional min A over 2 sessions  Baseline:  Goal status: ongoing   3.  Pt will ID functional deficits and verbalize appropriate solutions to optimize performance given occasional min A over 2 sessions Baseline:  Goal status: ongoing   4.  Pt will verbalize detailed synopsis of work related tasks given occasional min A over 2 sessions  Baseline:  Goal status: ongoing     LONG TERM GOALS: Target date: 02/06/2022     Pt will implement word retrieval strategies during 25+ minute unstructured conversation with ability to provide detailed information on related topic given rare min A over 2 sessions Baseline:  Goal status: ongoing   2.  Pt will verbalize and implement recommended cognitive compensations to optimize performance and task completion at home/work with rare min A over 2 sessions  Baseline:  Goal status: ongoing   3.  Pt will demonstrate increased awareness of functional deficits with pt able to verbalize deficits, identify appropriate strategy, and implement strategy given rare min A over 2 sessions  Baseline:  Goal status: ongoing   4.  Pt will report improved communication effectiveness via PROM by 2 points at last ST session Baseline: CPIB=19 Goal status: ongoing       ASSESSMENT:   CLINICAL IMPRESSION: Patient is a 51 y.o. female who was seen  today for CVA in June 2023. PHMX significant for ADHD. Today, pt reports "slight" memory changes and ongoing word finding episodes s/p stroke. Pt unable to  provide concrete examples of memory and word retrieval deficits, other than names which reportedly have always been challenging. Pt has yet to return to functional household or work related tasks with significant anticipation demonstrated. Standardized testing revealed mild-moderate memory and language deficits. Inconsistent awareness of errors exhibited on subtests, with suspected reduced recall of instructions noted x1 (symbol trials). Frustration and dislike of standardized testing exhibited, which could be related to reduced awareness. Conversational speech c/b vague language and circumlocution, with no overt anomia or dysnomia noted today. Skilled ST is recommended to address expressive language and cognitive deficits to optimize return to functional activities and work.    OBJECTIVE IMPAIRMENTS include memory, awareness, executive functioning, and aphasia. These impairments are limiting patient from return to work, household responsibilities, and effectively communicating at home and in community. Factors affecting potential to achieve goals and functional outcome are cooperation/participation level and previous level of function. Patient will benefit from skilled SLP services to address above impairments and improve overall function.   REHAB POTENTIAL: Good   PLAN: SLP FREQUENCY: 2x/week   SLP DURATION: 8 weeks   PLANNED INTERVENTIONS: Language facilitation, Cueing hierachy, Cognitive reorganization, Internal/external aids, Functional tasks, Multimodal communication approach, SLP instruction and feedback, Compensatory strategies, and Patient/family education   Gracy Racer, CCC-SLP 12/17/2021, 8:51 AM

## 2021-12-18 ENCOUNTER — Encounter: Payer: Self-pay | Admitting: Registered Nurse

## 2021-12-18 ENCOUNTER — Ambulatory Visit: Payer: BLUE CROSS/BLUE SHIELD | Attending: Physician Assistant | Admitting: Physical Therapy

## 2021-12-18 ENCOUNTER — Ambulatory Visit: Payer: BLUE CROSS/BLUE SHIELD

## 2021-12-18 ENCOUNTER — Encounter: Payer: Self-pay | Admitting: Physical Therapy

## 2021-12-18 VITALS — BP 151/84 | HR 61

## 2021-12-18 DIAGNOSIS — R4701 Aphasia: Secondary | ICD-10-CM | POA: Insufficient documentation

## 2021-12-18 DIAGNOSIS — R2689 Other abnormalities of gait and mobility: Secondary | ICD-10-CM | POA: Insufficient documentation

## 2021-12-18 DIAGNOSIS — I69153 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: Secondary | ICD-10-CM | POA: Diagnosis not present

## 2021-12-18 DIAGNOSIS — R2681 Unsteadiness on feet: Secondary | ICD-10-CM | POA: Insufficient documentation

## 2021-12-18 DIAGNOSIS — R208 Other disturbances of skin sensation: Secondary | ICD-10-CM | POA: Insufficient documentation

## 2021-12-18 DIAGNOSIS — I69151 Hemiplegia and hemiparesis following nontraumatic intracerebral hemorrhage affecting right dominant side: Secondary | ICD-10-CM | POA: Insufficient documentation

## 2021-12-18 DIAGNOSIS — R278 Other lack of coordination: Secondary | ICD-10-CM | POA: Insufficient documentation

## 2021-12-18 DIAGNOSIS — M6281 Muscle weakness (generalized): Secondary | ICD-10-CM | POA: Insufficient documentation

## 2021-12-18 DIAGNOSIS — R41841 Cognitive communication deficit: Secondary | ICD-10-CM

## 2021-12-18 DIAGNOSIS — R41842 Visuospatial deficit: Secondary | ICD-10-CM | POA: Diagnosis not present

## 2021-12-18 NOTE — Therapy (Signed)
OUTPATIENT SPEECH LANGUAGE PATHOLOGY TREATMENT NOTE   Patient Name: Denise Herrera MRN: [id number] DOB:1971/03/04, 51 y.o., female Today's Date: 12/18/2021  PCP: Tally Joe MD REFERRING PROVIDER: Charlton Amor, PA-C   END OF SESSION:   End of Session - 12/18/21 1521     Visit Number 2    Number of Visits 17    Date for SLP Re-Evaluation 02/06/22    Authorization Type BCBS    SLP Start Time 1530    SLP Stop Time  1615    SLP Time Calculation (min) 45 min    Activity Tolerance Patient tolerated treatment well             Past Medical History:  Diagnosis Date   Chronic insomnia    History of migraine headaches    Narcolepsy without cataplexy(347.00)    Past Surgical History:  Procedure Laterality Date   CESAREAN SECTION     gyn cervical procedure     Patient Active Problem List   Diagnosis Date Noted   ICH (intracerebral hemorrhage) (HCC) 11/03/2021   Essential hypertension 09/05/2015   INSOMNIA, CHRONIC 03/01/2007   MIGRAINE HEADACHE 03/01/2007   Narcolepsy without cataplexy 03/01/2007    ONSET DATE: 11/03/2021    REFERRING DIAG: I61.0 (ICD-10-CM) - Nontraumatic subcortical hemorrhage of left cerebral hemisphere   THERAPY DIAG:  Cognitive communication deficit  Aphasia  Rationale for Evaluation and Treatment Rehabilitation  SUBJECTIVE: "nothing"  PAIN:  Are you having pain? No  OBJECTIVE:  TODAY'S TREATMENT:  12-18-21: Pt endorsed missed appointment yesterday due to forgot medications and glasses at home resulting in late arrival. Husband is currently managing medications, finances, and other household tasks with reported reluctance for wife to return to management (she previously completed independently). Discussed results of CLQT to highlight reduced error awareness, in which pt noted to explain away some deficits related vision and SLP instructions. SLP generated strategy for patient to request clarification and confirmation at home to aid recall  and comprehension. Decline in vocabulary reported, in which SLP targeted generating higher level synonyms with usual mod A required to complete. SLP provided education and handout of anomia cuing hierarchy for family to implement at home versus providing targeted word for her.   12-12-21: Pt expressed some frustration and dislike of formal standardized testing, in which SLP provided rationale to assist with determining current deficits. Reduced intellectual and emergent awareness noted during assessment, which is likely contributing factor to reported feelings. Pt would like to focus on functional tasks and communication, in which SLP emphasized benefit of patient/family to identify deficits and personal goals to be targeted in ST sessions.      PATIENT EDUCATION: Education details: see above Person educated: Patient Education method: Medical illustrator Education comprehension: verbalized understanding, returned demonstration, and needs further education     GOALS: Goals reviewed with patient? Yes   SHORT TERM GOALS: Target date: 01/08/2022     Pt will implement word retrieval strategies during functional naming tasks and/or structured conversation given occasional min A over 2 sessions Baseline: Goal status: ongoing   2.  Pt will demonstrate increased emergent  and anticipatory awareness of errors during structured tasks related to home/work given occasional min A over 2 sessions  Baseline:  Goal status: ongoing   3.  Pt will ID functional deficits and verbalize appropriate solutions to optimize performance given occasional min A over 2 sessions Baseline:  Goal status: ongoing   4.  Pt will verbalize detailed synopsis of work related tasks  given occasional min A over 2 sessions  Baseline:  Goal status: ongoing     LONG TERM GOALS: Target date: 02/06/2022     Pt will implement word retrieval strategies during 25+ minute unstructured conversation with ability to provide  detailed information on related topic given rare min A over 2 sessions Baseline:  Goal status: ongoing   2.  Pt will verbalize and implement recommended cognitive compensations to optimize performance and task completion at home/work with rare min A over 2 sessions  Baseline:  Goal status: ongoing   3.  Pt will demonstrate increased awareness of functional deficits with pt able to verbalize deficits, identify appropriate strategy, and implement strategy given rare min A over 2 sessions  Baseline:  Goal status: ongoing   4.  Pt will report improved communication effectiveness via PROM by 2 points at last ST session Baseline: CPIB=19 Goal status: ongoing       ASSESSMENT:   CLINICAL IMPRESSION: Patient is a 51 y.o. female who was seen today for CVA in June 2023. PHMX significant for ADHD. Today, pt reports "slight" memory changes and ongoing word finding episodes s/p stroke. Pt unable to provide concrete examples of memory and word retrieval deficits, other than names which reportedly have always been challenging. Pt has yet to return to functional household or work related tasks with significant anticipation demonstrated. Standardized testing revealed mild-moderate memory and language deficits. Inconsistent awareness of errors exhibited on subtests, with suspected reduced recall of instructions noted x1 (symbol trials). Frustration and dislike of standardized testing exhibited, which could be related to reduced awareness. Conversational speech c/b vague language and circumlocution, with no overt anomia or dysnomia noted today. Skilled ST is recommended to address expressive language and cognitive deficits to optimize return to functional activities and work.    OBJECTIVE IMPAIRMENTS include memory, awareness, executive functioning, and aphasia. These impairments are limiting patient from return to work, household responsibilities, and effectively communicating at home and in community. Factors  affecting potential to achieve goals and functional outcome are cooperation/participation level and previous level of function. Patient will benefit from skilled SLP services to address above impairments and improve overall function.   REHAB POTENTIAL: Good   PLAN: SLP FREQUENCY: 2x/week   SLP DURATION: 8 weeks   PLANNED INTERVENTIONS: Language facilitation, Cueing hierachy, Cognitive reorganization, Internal/external aids, Functional tasks, Multimodal communication approach, SLP instruction and feedback, Compensatory strategies, and Patient/family education   Gracy Racer, CCC-SLP 12/18/2021, 3:21 PM

## 2021-12-18 NOTE — Therapy (Signed)
OUTPATIENT PHYSICAL THERAPY TREATMENT NOTE   Patient Name: Denise Herrera MRN: [id number] DOB:August 24, 1970, 51 y.o., female Today's Date: 12/18/2021  PCP: Tally Joe, MD REFERRING PROVIDER: Charlton Amor, PA-C  END OF SESSION:   PT End of Session - 12/18/21 1621     Visit Number 2    Number of Visits 13   12+eval   Date for PT Re-Evaluation 02/06/22   pushed out due to scheduling delay   Authorization Type [employer]    PT Start Time 1615    PT Stop Time 1700    PT Time Calculation (min) 45 min    Equipment Utilized During Treatment Gait belt   R Posterior Townsend AFO   Activity Tolerance Patient tolerated treatment well    Behavior During Therapy WFL for tasks assessed/performed             Past Medical History:  Diagnosis Date   Chronic insomnia    History of migraine headaches    Narcolepsy without cataplexy(347.00)    Past Surgical History:  Procedure Laterality Date   CESAREAN SECTION     gyn cervical procedure     Patient Active Problem List   Diagnosis Date Noted   ICH (intracerebral hemorrhage) (HCC) 11/03/2021   Essential hypertension 09/05/2015   INSOMNIA, CHRONIC 03/01/2007   MIGRAINE HEADACHE 03/01/2007   Narcolepsy without cataplexy 03/01/2007    REFERRING DIAG: I61.0 (ICD-10-CM) - Nontraumatic subcortical hemorrhage of left cerebral hemisphere (HCC)   THERAPY DIAG:  Unsteadiness on feet  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  Left CVA/ICH confirmed. Admitted to rehab on 11/08/2021 and discharged 11/29/2021.  PRECAUTIONS: Fall  SUBJECTIVE: Nothing new, no changes, denies falls.  PAIN:  Are you having pain? No   OBJECTIVE: (objective  measures completed at initial evaluation unless otherwise dated)   TODAY'S TREATMENT:  VITALS: Today's Vitals   12/18/21 1617  BP: (!) 151/84  Pulse: 61    Setup FOTO and had pt fill out at onset of session.    -Terminal knee extension w/ green theraband x15  -advance retreat to target over 3" decline to promote quad control  -RLE step ups using left rail w/ cuing and facilitation to maintain soft bend in knee during stance and step off x20 > no UE support supervision level x20 w/ intermittent left trunk rotation requiring CGA x2; instructed pt to have family member stand right beside her for performance safety at home. -SLS (several bouts) w/ BUE support working on soft knee bend and quad control  GAIT: Gait pattern:  Right LE ER, step through pattern, decreased stance time- Right, circumduction- Right, genu recurvatum- Right, genu recurvatum- Left, and trendelenburg Distance walked: 287' Assistive device utilized: Environmental consultant - 2 wheeled Level of assistance: Modified independence and SBA Comments: Pt has good right foot clearance when ambulating w/o right posterior Townsend AFO.  She demonstrates bilateral genu recurvatum and some notable bowing of both tibias during stance phase with right presenting in much more severe form.  She ambulates with right LE ER and is able to correct and intermittently maintain with cuing.     PATIENT EDUCATION: Education details: Initial HEP. Person educated: Patient Education method: Explanation Education comprehension: verbalized understanding   HOME EXERCISE PROGRAM: Access Code: [facility] URL: [email or web address] Date:  12/18/2021 Prepared by: Camille Bal  Exercises - Standing Terminal Knee Extension with Resistance  - 1 x daily - 7 x weekly - 3 sets - 10 reps - Forward Step Up with Counter Support  - 1 x daily - 7 x weekly - 3 sets - 10 reps - Standing Single Leg Stance with Counter Support  - 1 x daily - 7 x weekly - 3  sets - 10 reps -->performed for soft knee bend/quad control rather than pure balance  GOALS: Goals reviewed with patient? Yes  SHORT TERM GOALS: Target date: 01/02/2022  Pt will be independent with strength and balance HEP with supervision from family as needed. Baseline:  To be established. Goal status: INITIAL  2.  Pt will navigate 4 stairs w/o handrails reciprocally at supervision level to improve safety with functional mobility in her home. Baseline:  step-to using lean against half wall/rail Goal status: INITIAL  3.  Pt will ambulate >/=375 feet over indoor surfaces with LRAD and supervision level of assist to promote household and community access. Baseline: 254' SBA RW Goal status: INITIAL  4.  Pt will decrease 5xSTS to </=13 seconds in order to demonstrate decreased risk for falls and improved functional bilateral LE strength and power. Baseline: 17.03 sec w/ LUE Goal status: INITIAL  5.  Pt will ambulate>/=300 feet on to demonstrate improved endurance for functional tasks in home and community. Baseline: 16' w/ RW Goal status: INITIAL  6.  Pt will demonstrate a gait speed of >/=2.20 feet/sec in order to decrease risk for falls. Baseline: 1.90 ft w/ RW Goal status: INITIAL  LONG TERM GOALS: Target date: 01/23/2022  Pt will increase FOTO score to >/=75% in order to demonstrate subjective functional improvement. Baseline: 68% Goal status: INITIAL  2.  Pt will navigate 16 stairs using no rails supervision level reciprocally to improve safe functional mobility in home environment. Baseline: 4 step-to leaning against half wall/rail Goal status: INITIAL  3.  Pt will ambulate >/=750 feet with LRAD and ModI level of assist over various indoor and outdoor surfaces to promote household and community access. Baseline: 254' SBA RW Goal status: INITIAL  4.  Pt will demonstrate a gait speed of >/=2.4 feet/sec in order to decrease risk for falls. Baseline: 1.90 ft/sec Goal  status: INITIAL  ASSESSMENT:  CLINICAL IMPRESSION: Focus of skilled session on initiating HEP for quad strength and control.  Pt exhibits good right ankle control during functional mobility without use of AFO, but increased right hyperextension and compensation at hip noted during various phases of gait.  She will continue to benefit from skilled PT to address impairments outlined in current POC.  OBJECTIVE IMPAIRMENTS Abnormal gait, decreased balance, decreased cognition, decreased endurance, and decreased strength.   ACTIVITY LIMITATIONS carrying, lifting, and locomotion level  PARTICIPATION LIMITATIONS: driving, community activity, occupation, and yard work  PERSONAL FACTORS Age, Fitness, Transportation, and 1 comorbidity: HTN are also affecting patient's functional outcome.   REHAB POTENTIAL: Excellent  CLINICAL DECISION MAKING: Stable/uncomplicated  EVALUATION COMPLEXITY: Low  PLAN: PT FREQUENCY: 2x/week  PT DURATION: 6 weeks  PLANNED INTERVENTIONS: Therapeutic exercises, Therapeutic activity, Neuromuscular re-education, Balance training, Gait training, Patient/Family education, Self Care, Stair training, Vestibular training, DME instructions, Electrical stimulation, Biofeedback, and Re-evaluation  PLAN FOR NEXT SESSION: Modify HEP prn for right sided quad and glut strength and high level balance.  Gait training, stair training, pt may benefit from Bioness in the future.  Trial increased right heel lift (pt has approx. 1/2 inch  currently, posterior Townsend AFO is primarily for hyperext. as pt has adequate R DF).  Continue adv/retreat w/ incline blocks/compliant wedge, have pt practice ambulating up and down incline surfaces like ramp.  Heel taps.  SLS tasks.  Check tennis balls on RW-replace?  Leg press.  Hip drives, hip strengthening.   Sadie Haber, PT, DPT 12/18/2021, 5:40 PM

## 2021-12-18 NOTE — Patient Instructions (Signed)
Cueing Hierarchy:  Description - define the word or provide the function First letter - provide the first letter of the target word Spell - spell the target word, orally Phrase Completion - give a phrase to complete First sound - provide the first sound of the target word Model - say the word aloud to repeat   Example target: "mug" The thing you put coffee in Starts with m M - U - G Fill my coffee _____ "Muh" Mug  

## 2021-12-19 ENCOUNTER — Ambulatory Visit: Payer: BLUE CROSS/BLUE SHIELD | Admitting: Physical Therapy

## 2021-12-19 ENCOUNTER — Ambulatory Visit: Payer: BLUE CROSS/BLUE SHIELD

## 2021-12-19 ENCOUNTER — Telehealth: Payer: Self-pay | Admitting: Physical Therapy

## 2021-12-19 ENCOUNTER — Encounter: Payer: Self-pay | Admitting: Physical Therapy

## 2021-12-19 VITALS — BP 164/100 | HR 60

## 2021-12-19 DIAGNOSIS — R41841 Cognitive communication deficit: Secondary | ICD-10-CM

## 2021-12-19 DIAGNOSIS — R278 Other lack of coordination: Secondary | ICD-10-CM

## 2021-12-19 DIAGNOSIS — M6281 Muscle weakness (generalized): Secondary | ICD-10-CM | POA: Diagnosis not present

## 2021-12-19 DIAGNOSIS — R4701 Aphasia: Secondary | ICD-10-CM

## 2021-12-19 DIAGNOSIS — I69151 Hemiplegia and hemiparesis following nontraumatic intracerebral hemorrhage affecting right dominant side: Secondary | ICD-10-CM | POA: Diagnosis not present

## 2021-12-19 DIAGNOSIS — R2689 Other abnormalities of gait and mobility: Secondary | ICD-10-CM | POA: Diagnosis not present

## 2021-12-19 DIAGNOSIS — R208 Other disturbances of skin sensation: Secondary | ICD-10-CM | POA: Diagnosis not present

## 2021-12-19 DIAGNOSIS — I69153 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: Secondary | ICD-10-CM | POA: Diagnosis not present

## 2021-12-19 DIAGNOSIS — R2681 Unsteadiness on feet: Secondary | ICD-10-CM | POA: Diagnosis not present

## 2021-12-19 DIAGNOSIS — R41842 Visuospatial deficit: Secondary | ICD-10-CM | POA: Diagnosis not present

## 2021-12-19 NOTE — Telephone Encounter (Signed)
Denise Herrera was seen in physical therapy today with decision to hold PT as diastolic BP trending too high this session.  Blood pressures as follows:  158/110, 164/99, 164/100.  She was asymptomatic and did not feel need to visit ED.  Discussed monitoring BP at home over next hour as she had just taken her BP meds 30 minutes prior to session while in speech therapy.  Edu provided on utilizing ED if she becomes symptomatic and reaching out to her PCP for consult.  I just wanted to make you aware as I know her medication regimen has just changed and in case anyone feels it appropriate to reach out to patient.  Thank you, Camille Bal, PT, DPT

## 2021-12-19 NOTE — Therapy (Signed)
[name] [address] [place], [place], [address] Phone: [phone]   Fax:  [phone]  ARRIVED NO CHARGE  Patient Details  Name: Denise Herrera MRN: [id number] Date of Birth: Nov 07, 1970 Referring Provider:  Tally Joe, MD  Encounter Date: 12/19/2021  Today's Vitals   12/19/21 0935 12/19/21 0959 12/19/21 1000  BP: (!) 158/110 (!) 164/99 (!) 164/100  Pulse: (!) 54 65 60  Initial 2 BP taken with automatic cuff, third taken manually. Pt denies headache, blurry vision, or other acute issues/changes.  State she took BP meds 30 mins ago during ST session.  Seen yesterday with BP of 151/84 in afternoon.  Edu to patient on BP parameters safe for exercise and holding PT today to allow patient time for medicine to work.  Discussed having patient reassess BP at home and reaching out to MD if diastolic increase persists.  Discussed utilizing ED/urgent care if she becomes symptomatic.  Will reach out to MD to make aware of situation.   Sadie Haber, PT, DPT 12/19/2021, 10:16 AM  [facility] [address] [place], [place], [address] Phone: [phone]   Fax:  [phone]

## 2021-12-19 NOTE — Therapy (Signed)
OUTPATIENT SPEECH LANGUAGE PATHOLOGY TREATMENT NOTE   Patient Name: Denise Herrera MRN: [id number] DOB:Mar 02, 1971, 51 y.o., female Today's Date: 12/19/2021  PCP: Tally Joe MD REFERRING PROVIDER: Charlton Amor, PA-C   END OF SESSION:   End of Session - 12/19/21 0850     Visit Number 3    Number of Visits 17    Date for SLP Re-Evaluation 02/06/22    Authorization Type BCBS    SLP Start Time 0847    SLP Stop Time  0930    SLP Time Calculation (min) 43 min    Activity Tolerance Patient tolerated treatment well              Past Medical History:  Diagnosis Date   Chronic insomnia    History of migraine headaches    Narcolepsy without cataplexy(347.00)    Past Surgical History:  Procedure Laterality Date   CESAREAN SECTION     gyn cervical procedure     Patient Active Problem List   Diagnosis Date Noted   ICH (intracerebral hemorrhage) (HCC) 11/03/2021   Essential hypertension 09/05/2015   INSOMNIA, CHRONIC 03/01/2007   MIGRAINE HEADACHE 03/01/2007   Narcolepsy without cataplexy 03/01/2007    ONSET DATE: 11/03/2021    REFERRING DIAG: I61.0 (ICD-10-CM) - Nontraumatic subcortical hemorrhage of left cerebral hemisphere   THERAPY DIAG:  Cognitive communication deficit  Aphasia  Rationale for Evaluation and Treatment Rehabilitation  SUBJECTIVE: "I need a new phone"  PAIN:  Are you having pain? No  OBJECTIVE:  TODAY'S TREATMENT:  12-19-21: Pt recalled all necessary items for timely arrival for therapy today. Pt endorsed she is mostly completing ADL routine with rare min A from husband. Pt has returned to some basic cooking and managing bills x4 with reported success. In conversation, no overt anomia exhibited with self-correction of dysnomia appreciated x1. Targeted ID of synonyms to optimize lexical connections, with usual fading to occasional mod A required to aid naming. Pt endorsed feeling "blank" and requested SLP assistance. Discussed functional  practice at home and family providing appropriate cues to aid word finding for increased patient benefit.   12-18-21: Pt endorsed missed appointment yesterday due to forgot medications and glasses at home resulting in late arrival. Husband is currently managing medications, finances, and other household tasks with reported reluctance for wife to return to management (she previously completed independently). Discussed results of CLQT to highlight reduced error awareness, in which pt noted to explain away some deficits related vision and SLP instructions. SLP generated strategy for patient to request clarification and confirmation at home to aid recall and comprehension. Decline in vocabulary reported, in which SLP targeted generating higher level synonyms with usual mod A required to complete. SLP provided education and handout of anomia cuing hierarchy for family to implement at home versus providing targeted word for her.   12-12-21: Pt expressed some frustration and dislike of formal standardized testing, in which SLP provided rationale to assist with determining current deficits. Reduced intellectual and emergent awareness noted during assessment, which is likely contributing factor to reported feelings. Pt would like to focus on functional tasks and communication, in which SLP emphasized benefit of patient/family to identify deficits and personal goals to be targeted in ST sessions.      PATIENT EDUCATION: Education details: see above Person educated: Patient Education method: Medical illustrator Education comprehension: verbalized understanding, returned demonstration, and needs further education     GOALS: Goals reviewed with patient? Yes   SHORT TERM GOALS: Target date:  01/08/2022     Pt will implement word retrieval strategies during functional naming tasks and/or structured conversation given occasional min A over 2 sessions Baseline: Goal status: ongoing   2.  Pt will  demonstrate increased emergent  and anticipatory awareness of errors during structured tasks related to home/work given occasional min A over 2 sessions  Baseline:  Goal status: ongoing   3.  Pt will ID functional deficits and verbalize appropriate solutions to optimize performance given occasional min A over 2 sessions Baseline:  Goal status: ongoing   4.  Pt will verbalize detailed synopsis of work related tasks given occasional min A over 2 sessions  Baseline:  Goal status: ongoing     LONG TERM GOALS: Target date: 02/06/2022     Pt will implement word retrieval strategies during 25+ minute unstructured conversation with ability to provide detailed information on related topic given rare min A over 2 sessions Baseline:  Goal status: ongoing   2.  Pt will verbalize and implement recommended cognitive compensations to optimize performance and task completion at home/work with rare min A over 2 sessions  Baseline:  Goal status: ongoing   3.  Pt will demonstrate increased awareness of functional deficits with pt able to verbalize deficits, identify appropriate strategy, and implement strategy given rare min A over 2 sessions  Baseline:  Goal status: ongoing   4.  Pt will report improved communication effectiveness via PROM by 2 points at last ST session Baseline: CPIB=19 Goal status: ongoing       ASSESSMENT:   CLINICAL IMPRESSION: Patient is a 51 y.o. female who was seen today for CVA in June 2023. PHMX significant for ADHD. Conducted ongoing education and training of word retrieval strategies to maximize verbal expression as well as discussed functional household tasks and compensations to optimize patient awareness and return to prior functioning. Skilled ST is recommended to address expressive language and cognitive deficits to optimize return to functional activities and work.    OBJECTIVE IMPAIRMENTS include memory, awareness, executive functioning, and aphasia. These  impairments are limiting patient from return to work, household responsibilities, and effectively communicating at home and in community. Factors affecting potential to achieve goals and functional outcome are cooperation/participation level and previous level of function. Patient will benefit from skilled SLP services to address above impairments and improve overall function.   REHAB POTENTIAL: Good   PLAN: SLP FREQUENCY: 2x/week   SLP DURATION: 8 weeks   PLANNED INTERVENTIONS: Language facilitation, Cueing hierachy, Cognitive reorganization, Internal/external aids, Functional tasks, Multimodal communication approach, SLP instruction and feedback, Compensatory strategies, and Patient/family education   Gracy Racer, CCC-SLP 12/19/2021, 8:53 AM

## 2021-12-24 ENCOUNTER — Encounter: Payer: Self-pay | Admitting: Physical Therapy

## 2021-12-24 ENCOUNTER — Ambulatory Visit: Payer: BLUE CROSS/BLUE SHIELD | Admitting: Physical Therapy

## 2021-12-24 ENCOUNTER — Telehealth: Payer: Self-pay | Admitting: Physical Therapy

## 2021-12-24 ENCOUNTER — Ambulatory Visit: Payer: BLUE CROSS/BLUE SHIELD

## 2021-12-24 VITALS — BP 172/98 | HR 63

## 2021-12-24 DIAGNOSIS — R278 Other lack of coordination: Secondary | ICD-10-CM | POA: Diagnosis not present

## 2021-12-24 DIAGNOSIS — R2689 Other abnormalities of gait and mobility: Secondary | ICD-10-CM | POA: Diagnosis not present

## 2021-12-24 DIAGNOSIS — R2681 Unsteadiness on feet: Secondary | ICD-10-CM | POA: Diagnosis not present

## 2021-12-24 DIAGNOSIS — R41841 Cognitive communication deficit: Secondary | ICD-10-CM | POA: Diagnosis not present

## 2021-12-24 DIAGNOSIS — R208 Other disturbances of skin sensation: Secondary | ICD-10-CM | POA: Diagnosis not present

## 2021-12-24 DIAGNOSIS — I69151 Hemiplegia and hemiparesis following nontraumatic intracerebral hemorrhage affecting right dominant side: Secondary | ICD-10-CM | POA: Diagnosis not present

## 2021-12-24 DIAGNOSIS — R4701 Aphasia: Secondary | ICD-10-CM

## 2021-12-24 DIAGNOSIS — M6281 Muscle weakness (generalized): Secondary | ICD-10-CM | POA: Diagnosis not present

## 2021-12-24 DIAGNOSIS — R41842 Visuospatial deficit: Secondary | ICD-10-CM | POA: Diagnosis not present

## 2021-12-24 DIAGNOSIS — I69153 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: Secondary | ICD-10-CM | POA: Diagnosis not present

## 2021-12-24 NOTE — Telephone Encounter (Signed)
PT reached out to Dr. Merita Norton office [facility]) today and spoke with nurse to inform of ongoing BP issues, particularly diastolic.  Discussed working pt into schedule sooner than upcoming visit on 8/25 due to limitations to PT.  Nurse states she will reach out to patient.  Camille Bal, PT, DPT

## 2021-12-24 NOTE — Therapy (Signed)
OUTPATIENT SPEECH LANGUAGE PATHOLOGY TREATMENT NOTE   Patient Name: Denise Herrera MRN: [id number] DOB:06/23/1970, 51 y.o., female Today's Date: 12/24/2021  PCP: Tally Joe MD REFERRING PROVIDER: Charlton Amor, PA-C   END OF SESSION:   End of Session - 12/24/21 1017     Visit Number 4    Number of Visits 17    Date for SLP Re-Evaluation 02/06/22    Authorization Type BC[employer]    SLP Start Time 1017    SLP Stop Time  1100    SLP Time Calculation (min) 43 min    Activity Tolerance Patient tolerated treatment well               Past Medical History:  Diagnosis Date   Chronic insomnia    History of migraine headaches    Narcolepsy without cataplexy(347.00)    Past Surgical History:  Procedure Laterality Date   CESAREAN SECTION     gyn cervical procedure     Patient Active Problem List   Diagnosis Date Noted   ICH (intracerebral hemorrhage) (HCC) 11/03/2021   Essential hypertension 09/05/2015   INSOMNIA, CHRONIC 03/01/2007   MIGRAINE HEADACHE 03/01/2007   Narcolepsy without cataplexy 03/01/2007    ONSET DATE: 11/03/2021    REFERRING DIAG: I61.0 (ICD-10-CM) - Nontraumatic subcortical hemorrhage of left cerebral hemisphere   THERAPY DIAG:  Cognitive communication deficit  Rationale for Evaluation and Treatment Rehabilitation  SUBJECTIVE: "My blood pressure was too high to continue PT"  PAIN:  Are you having pain? No  OBJECTIVE:  TODAY'S TREATMENT:  12-24-21: Pt has returned to medication management, with pt able to recall types of medications taken and initial letter of names with good accuracy. Pt denied any difficulty with recalling to take medications. Received phone call from MD during session re: management of BP, in which pt exhibited good recall without need for compensations. Pt denied need to write down information to aid delayed recall. Pt returned to grocery shopping without use of list, with no difficulty reported. Targeted higher level word  finding (divergent categories), with usual min to mod A required to expand mental flexibility (perseverated on own preferences) after initial 5-6 items named. Updated HEP to include high level word finding.   12-19-21: Pt recalled all necessary items for timely arrival for therapy today. Pt endorsed she is mostly completing ADL routine with rare min A from husband. Pt has returned to some basic cooking and managing bills x4 with reported success. In conversation, no overt anomia exhibited with self-correction of dysnomia appreciated x1. Targeted ID of synonyms to optimize lexical connections, with usual fading to occasional mod A required to aid naming. Pt endorsed feeling "blank" and requested SLP assistance. Discussed functional practice at home and family providing appropriate cues to aid word finding for increased patient benefit.   12-18-21: Pt endorsed missed appointment yesterday due to forgot medications and glasses at home resulting in late arrival. Husband is currently managing medications, finances, and other household tasks with reported reluctance for wife to return to management (she previously completed independently). Discussed results of CLQT to highlight reduced error awareness, in which pt noted to explain away some deficits related vision and SLP instructions. SLP generated strategy for patient to request clarification and confirmation at home to aid recall and comprehension. Decline in vocabulary reported, in which SLP targeted generating higher level synonyms with usual mod A required to complete. SLP provided education and handout of anomia cuing hierarchy for family to implement at home versus providing targeted  word for her.   12-12-21: Pt expressed some frustration and dislike of formal standardized testing, in which SLP provided rationale to assist with determining current deficits. Reduced intellectual and emergent awareness noted during assessment, which is likely contributing factor to  reported feelings. Pt would like to focus on functional tasks and communication, in which SLP emphasized benefit of patient/family to identify deficits and personal goals to be targeted in ST sessions.      PATIENT EDUCATION: Education details: see above Person educated: Patient Education method: Medical illustrator Education comprehension: verbalized understanding, returned demonstration, and needs further education     GOALS: Goals reviewed with patient? Yes   SHORT TERM GOALS: Target date: 01/08/2022     Pt will implement word retrieval strategies during functional naming tasks and/or structured conversation given occasional min A over 2 sessions Baseline: 12-24-21 Goal status: ongoing   2.  Pt will demonstrate increased emergent  and anticipatory awareness of errors during structured tasks related to home/work given occasional min A over 2 sessions  Baseline:  Goal status: ongoing   3.  Pt will ID functional deficits and verbalize appropriate solutions to optimize performance given occasional min A over 2 sessions Baseline: 12-24-21 Goal status: ongoing   4.  Pt will verbalize detailed synopsis of work related tasks given occasional min A over 2 sessions  Baseline:  Goal status: ongoing     LONG TERM GOALS: Target date: 02/06/2022     Pt will implement word retrieval strategies during 25+ minute unstructured conversation with ability to provide detailed information on related topic given rare min A over 2 sessions Baseline:  Goal status: ongoing   2.  Pt will verbalize and implement recommended cognitive compensations to optimize performance and task completion at home/work with rare min A over 2 sessions  Baseline:  Goal status: ongoing   3.  Pt will demonstrate increased awareness of functional deficits with pt able to verbalize deficits, identify appropriate strategy, and implement strategy given rare min A over 2 sessions  Baseline:  Goal status: ongoing   4.   Pt will report improved communication effectiveness via PROM by 2 points at last ST session Baseline: CPIB=19 Goal status: ongoing       ASSESSMENT:   CLINICAL IMPRESSION: Patient is a 51 y.o. female who was seen today for CVA in June 2023. PHMX significant for ADHD. Conducted ongoing education and training of word retrieval strategies to maximize verbal expression as well as targeted recall for functional household tasks and compensations to optimize patient awareness and return to prior functioning. Skilled ST is recommended to address expressive language and cognitive deficits to optimize return to functional activities and work.    OBJECTIVE IMPAIRMENTS include memory, awareness, executive functioning, and aphasia. These impairments are limiting patient from return to work, household responsibilities, and effectively communicating at home and in community. Factors affecting potential to achieve goals and functional outcome are cooperation/participation level and previous level of function. Patient will benefit from skilled SLP services to address above impairments and improve overall function.   REHAB POTENTIAL: Good   PLAN: SLP FREQUENCY: 2x/week   SLP DURATION: 8 weeks   PLANNED INTERVENTIONS: Language facilitation, Cueing hierachy, Cognitive reorganization, Internal/external aids, Functional tasks, Multimodal communication approach, SLP instruction and feedback, Compensatory strategies, and Patient/family education   Gracy Racer, CCC-SLP 12/24/2021, 10:18 AM

## 2021-12-24 NOTE — Therapy (Signed)
OUTPATIENT PHYSICAL THERAPY ARRIVED NO CHARGE   Patient Name: Denise Herrera MRN: [id number] DOB:02-19-1971, 51 y.o., female Today's Date: 12/24/2021  PCP: Tally Joe, MD REFERRING PROVIDER: Charlton Amor, PA-C  END OF SESSION:   PT End of Session - 12/24/21 0940     Visit Number 4    Number of Visits 13   12+eval   Date for PT Re-Evaluation 02/06/22   pushed out due to scheduling delay   Authorization Type [employer]    PT Start Time [phone]   pt late   PT Stop Time 1000    PT Time Calculation (min) 24 min    Equipment Utilized During Treatment Gait belt   R Posterior Townsend AFO   Activity Tolerance Patient tolerated treatment well    Behavior During Therapy WFL for tasks assessed/performed             Past Medical History:  Diagnosis Date   Chronic insomnia    History of migraine headaches    Narcolepsy without cataplexy(347.00)    Past Surgical History:  Procedure Laterality Date   CESAREAN SECTION     gyn cervical procedure     Patient Active Problem List   Diagnosis Date Noted   ICH (intracerebral hemorrhage) (HCC) 11/03/2021   Essential hypertension 09/05/2015   INSOMNIA, CHRONIC 03/01/2007   MIGRAINE HEADACHE 03/01/2007   Narcolepsy without cataplexy 03/01/2007    REFERRING DIAG: I61.0 (ICD-10-CM) - Nontraumatic subcortical hemorrhage of left cerebral hemisphere (HCC)   THERAPY DIAG:  Unsteadiness on feet  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  Left CVA/ICH confirmed. Admitted to rehab  on 11/08/2021 and discharged 11/29/2021.  PRECAUTIONS: Fall  SUBJECTIVE: Nothing new, no changes, denies falls.  She states she has been walking up the street (approximately 50') with grandson walking behind her w/o wearing AFO.  She reports she has had no issues including knee pain from this.  She states she does this 3-4x/day.  She reports she took her BP meds at 7am today and has been managing her meds since last visit with ST/PT.  She reports her husband is letting her go up the steps without physical assistance (with supervision), started making her meal plates, other ADLs.  Pt requesting to compare home BP cuff to clinic cuff.  PAIN:  Are you having pain? No   OBJECTIVE: (objective measures completed at initial evaluation unless otherwise dated)   TODAY'S TREATMENT:  VITALS (LUE in sitting): Today's Vitals   12/24/21 0948 12/24/21 0950  BP: (!) 176/102 (!) 172/98  Pulse: 63   Initial BP taken with clinic auto cuff, second with manual.  Was unable to get pt's BP cuff to read, attempted x3 w/ error message displayed.  Pt is asymptomatic, no headache, vision changes, sweating, etc.  She states she would not like to visit the ED, but would like to have her doctor review her medications to see if they can change her doses since her Ritalin was recently increased and she is worried this is affecting the BP meds.  PT provides edu on s/s requiring emergency services and BP limitations to PT/performing HEP.  Pt verbalizes understanding.  Will reach out to MD again to address ongoing BP issues limiting therapy.   PATIENT EDUCATION: Education details: See above. Person educated: Patient Education method: Explanation Education comprehension: verbalized understanding   HOME EXERCISE PROGRAM: Access Code: [facility] URL: [email or web address] Date: 12/18/2021 Prepared by: Camille Bal  Exercises - Standing Terminal Knee Extension with Resistance  - 1 x daily - 7 x weekly -  3 sets - 10 reps - Forward Step Up with Counter Support  - 1 x daily - 7 x weekly - 3 sets - 10 reps - Standing Single Leg Stance with Counter Support  - 1 x daily - 7 x weekly - 3 sets - 10 reps -->performed for soft knee bend/quad control rather than pure balance  GOALS: Goals reviewed with patient? Yes  SHORT TERM GOALS: Target date: 01/02/2022  Pt will be independent with strength and balance HEP with supervision from family as needed. Baseline:  To be established. Goal status: INITIAL  2.  Pt will navigate 4 stairs w/o handrails reciprocally at supervision level to improve safety with functional mobility in her home. Baseline:  step-to using lean against half wall/rail Goal status: INITIAL  3.  Pt will ambulate >/=375 feet over indoor surfaces with LRAD and supervision level of assist to promote household and community access. Baseline: 254' SBA RW Goal status: INITIAL  4.  Pt will decrease 5xSTS to </=13 seconds in order to demonstrate decreased risk for falls and improved functional bilateral LE strength and power. Baseline: 17.03 sec w/ LUE Goal status: INITIAL  5.  Pt will ambulate>/=300 feet on to demonstrate improved endurance for functional tasks in home and community. Baseline: 49' w/ RW Goal status: INITIAL  6.  Pt will demonstrate a gait speed of >/=2.20 feet/sec in order to decrease risk for falls. Baseline: 1.90 ft w/ RW Goal status: INITIAL  LONG TERM GOALS: Target date: 01/23/2022  Pt will increase FOTO score to >/=75% in order to demonstrate subjective functional improvement. Baseline: 68% Goal status: INITIAL  2.  Pt will navigate 16 stairs using no rails supervision level reciprocally to improve safe functional mobility in home environment. Baseline: 4 step-to leaning against half wall/rail Goal status: INITIAL  3.  Pt will ambulate >/=750 feet with LRAD and ModI level of assist over various indoor and outdoor surfaces to promote household and  community access. Baseline: 254' SBA RW Goal status: INITIAL  4.  Pt will demonstrate a gait speed of >/=2.4 feet/sec in order to decrease risk for falls. Baseline: 1.90 ft/sec Goal status: INITIAL  ASSESSMENT:  CLINICAL IMPRESSION: Session held today due to ongoing elevated BP, particularly diastolic.  She remains asymptomatic, but PT to reach out to PCP to inform of issues and seek guidance and recommendations for patient.  OBJECTIVE IMPAIRMENTS Abnormal gait, decreased balance, decreased cognition, decreased endurance, and decreased strength.   ACTIVITY LIMITATIONS carrying, lifting, and locomotion level  PARTICIPATION LIMITATIONS: driving, community activity, occupation, and yard work  PERSONAL FACTORS Age, Fitness, Transportation, and 1 comorbidity: HTN are also affecting patient's functional outcome.   REHAB POTENTIAL: Excellent  CLINICAL DECISION MAKING: Stable/uncomplicated  EVALUATION COMPLEXITY: Low  PLAN: PT FREQUENCY: 2x/week  PT DURATION: 6 weeks  PLANNED INTERVENTIONS: Therapeutic exercises, Therapeutic activity, Neuromuscular re-education, Balance training, Gait training, Patient/Family education, Self Care, Stair training, Vestibular training, DME instructions, Electrical stimulation, Biofeedback, and Re-evaluation  PLAN FOR NEXT SESSION: Modify HEP prn for right sided quad and glut strength and high level balance.  Gait training, stair training, pt  may benefit from Bioness in the future.  Trial increased right heel lift (pt has approx. 1/2 inch currently, posterior Townsend AFO is primarily for hyperext. as pt has adequate R DF).  Continue adv/retreat w/ incline blocks/compliant wedge, have pt practice ambulating up and down incline surfaces like ramp.  Heel taps.  SLS tasks.  Check tennis balls on RW-replace?  Leg press.  Hip drives, hip strengthening.   Sadie Haber, PT, DPT 12/24/2021, 10:15 AM

## 2021-12-25 DIAGNOSIS — I10 Essential (primary) hypertension: Secondary | ICD-10-CM | POA: Diagnosis not present

## 2021-12-26 ENCOUNTER — Ambulatory Visit: Payer: BLUE CROSS/BLUE SHIELD

## 2021-12-26 ENCOUNTER — Encounter: Payer: Self-pay | Admitting: Physical Therapy

## 2021-12-26 ENCOUNTER — Ambulatory Visit: Payer: BLUE CROSS/BLUE SHIELD | Admitting: Physical Therapy

## 2021-12-26 DIAGNOSIS — R278 Other lack of coordination: Secondary | ICD-10-CM | POA: Diagnosis not present

## 2021-12-26 DIAGNOSIS — I69153 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: Secondary | ICD-10-CM | POA: Diagnosis not present

## 2021-12-26 DIAGNOSIS — R2689 Other abnormalities of gait and mobility: Secondary | ICD-10-CM

## 2021-12-26 DIAGNOSIS — R41841 Cognitive communication deficit: Secondary | ICD-10-CM | POA: Diagnosis not present

## 2021-12-26 DIAGNOSIS — R208 Other disturbances of skin sensation: Secondary | ICD-10-CM | POA: Diagnosis not present

## 2021-12-26 DIAGNOSIS — R41842 Visuospatial deficit: Secondary | ICD-10-CM | POA: Diagnosis not present

## 2021-12-26 DIAGNOSIS — I69151 Hemiplegia and hemiparesis following nontraumatic intracerebral hemorrhage affecting right dominant side: Secondary | ICD-10-CM

## 2021-12-26 DIAGNOSIS — R2681 Unsteadiness on feet: Secondary | ICD-10-CM | POA: Diagnosis not present

## 2021-12-26 DIAGNOSIS — M6281 Muscle weakness (generalized): Secondary | ICD-10-CM | POA: Diagnosis not present

## 2021-12-26 DIAGNOSIS — R4701 Aphasia: Secondary | ICD-10-CM

## 2021-12-26 NOTE — Therapy (Signed)
OUTPATIENT PHYSICAL THERAPY TREATMENT NOTE   Patient Name: Denise Herrera MRN: [id number] DOB:February 25, 1971, 51 y.o., female Today's Date: 12/26/2021  PCP: Tally Joe, MD REFERRING PROVIDER: Charlton Amor, PA-C  END OF SESSION:   PT End of Session - 12/26/21 0851     Visit Number 5    Number of Visits 13   12+eval   Date for PT Re-Evaluation 02/06/22   pushed out due to scheduling delay   Authorization Type [employer]    PT Start Time 0848    PT Stop Time 0928    PT Time Calculation (min) 40 min    Equipment Utilized During Treatment Gait belt    Activity Tolerance Patient tolerated treatment well    Behavior During Therapy WFL for tasks assessed/performed             Past Medical History:  Diagnosis Date   Chronic insomnia    History of migraine headaches    Narcolepsy without cataplexy(347.00)    Past Surgical History:  Procedure Laterality Date   CESAREAN SECTION     gyn cervical procedure     Patient Active Problem List   Diagnosis Date Noted   ICH (intracerebral hemorrhage) (HCC) 11/03/2021   Essential hypertension 09/05/2015   INSOMNIA, CHRONIC 03/01/2007   MIGRAINE HEADACHE 03/01/2007   Narcolepsy without cataplexy 03/01/2007    REFERRING DIAG: I61.0 (ICD-10-CM) - Nontraumatic subcortical hemorrhage of left cerebral hemisphere (HCC)   THERAPY DIAG:  Unsteadiness on feet  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  Left CVA/ICH confirmed. Admitted to rehab on 11/08/2021 and discharged 11/29/2021.  PRECAUTIONS: Fall  SUBJECTIVE: Saw her PCP yesterday. On a new BP med that she just started this morning.   PAIN:  Are you having pain?  No     VITALS: Before session: BP 156/100.HR 69. After ~5 minutes quiet sitting: BP 159/93, HR 65    TODAY'S TREATMENT:  GAIT: Gait pattern: step through pattern, decreased step length- Right, decreased stance time- Left, decreased stride length, decreased hip/knee flexion- Right, genu recurvatum- Right, trunk flexed, and poor foot clearance- Right Distance walked: 230 x 1 (BP after gait 173/90, HR 67) Assistive device utilized: Walker - 2 wheeled and heel wedge all gait, simulated toe cap with second half Level of assistance: SBA and Min A Comments: cues for increased hip/knee flexion with swing phase and decreased circumduction. Facilitation at pelvis for weight shifting and to promote improved pelvic translation with right LE advancement. Noted incr in recurvatum as pt fatigued with gait. Decreased toe scuffing with use of simulated toe cap.  BALANCE/NMR: Seated/standing at [facility] with left foot on 2 inch box - sit<>stands x 10 reps with no UE support. Assist needed at right LE to prevent hyperextension of knee with cues to keep heel on floor (? If extensor tone kicking in due to increased weight bearing on limb with this ex) - standing mini squats x 10 reps with cues/facilitation to shift onto right LE as pt tends to stay on left side  Seated with left foot on foam bubble: sit<>stands x 10 reps with light UE support, cues/facilitation for increased right LE weight bearing/shifting with PTA using knee to prevent hyperextension of right knee.  BP checked at this point: 177/95, HR  68, after 5 minutes resting quietly 155/88   PATIENT EDUCATION: Education details: continue with current HEP Person educated: Patient Education method: Explanation Education comprehension: verbalized understanding   HOME EXERCISE PROGRAM: Access Code: [facility] URL: [email or web address] Date: 12/18/2021 Prepared by: Elease Etienne  Exercises - Standing Terminal Knee Extension with  Resistance  - 1 x daily - 7 x weekly - 3 sets - 10 reps - Forward Step Up with Counter Support  - 1 x daily - 7 x weekly - 3 sets - 10 reps - Standing Single Leg Stance with Counter Support  - 1 x daily - 7 x weekly - 3 sets - 10 reps -->performed for soft knee bend/quad control rather than pure balance  GOALS: Goals reviewed with patient? Yes  SHORT TERM GOALS: Target date: 01/02/2022  Pt will be independent with strength and balance HEP with supervision from family as needed. Baseline:  To be established. Goal status: INITIAL  2.  Pt will navigate 4 stairs w/o handrails reciprocally at supervision level to improve safety with functional mobility in her home. Baseline:  step-to using lean against half wall/rail Goal status: INITIAL  3.  Pt will ambulate >/=375 feet over indoor surfaces with LRAD and supervision level of assist to promote household and community access. Baseline: 254' SBA RW Goal status: INITIAL  4.  Pt will decrease 5xSTS to </=13 seconds in order to demonstrate decreased risk for falls and improved functional bilateral LE strength and power. Baseline: 17.03 sec w/ LUE Goal status: INITIAL  5.  Pt will ambulate>/=300 feet on 2MWT to demonstrate improved endurance for functional tasks in home and community. Baseline: 52' w/ RW Goal status: INITIAL  6.  Pt will demonstrate a gait speed of >/=2.20 feet/sec in order to decrease risk for falls. Baseline: 1.90 ft w/ RW Goal status: INITIAL  LONG TERM GOALS: Target date: 01/23/2022  Pt will increase FOTO score to >/=75% in order to demonstrate subjective functional improvement. Baseline: 68% Goal status: INITIAL  2.  Pt will navigate 16 stairs using no rails supervision level reciprocally to improve safe functional mobility in home environment. Baseline: 4 step-to leaning against half wall/rail Goal status: INITIAL  3.  Pt will ambulate >/=750 feet with LRAD and ModI level of assist over various indoor and outdoor  surfaces to promote household and community access. Baseline: 254' SBA RW Goal status: INITIAL  4.  Pt will demonstrate a gait speed of >/=2.4 feet/sec in order to decrease risk for falls. Baseline: 1.90 ft/sec Goal status: INITIAL  ASSESSMENT:  CLINICAL IMPRESSION: Today's skilled session continued to focus on gait mechanics and right LE strengthening with close monitoring of pt's BP. BP did elevate at times with activity, however decreased with rest and pt focusing on relaxation techniques as instructed to do by MD and therapy. The pt is progressing as BP allows to goals and should benefit from continued PT to progress toward unmet goals.   OBJECTIVE IMPAIRMENTS Abnormal gait, decreased balance, decreased cognition, decreased endurance, and decreased strength.   ACTIVITY LIMITATIONS carrying, lifting, and locomotion level  PARTICIPATION LIMITATIONS: driving, community activity, occupation, and yard work  PERSONAL FACTORS Age, Fitness, Transportation, and 1 comorbidity: HTN are also affecting patient's functional outcome.   REHAB POTENTIAL: Excellent  CLINICAL DECISION MAKING: Stable/uncomplicated  EVALUATION COMPLEXITY: Low  PLAN: PT FREQUENCY: 2x/week  PT DURATION: 6 weeks  PLANNED INTERVENTIONS: Therapeutic exercises, Therapeutic activity, Neuromuscular re-education, Balance training, Gait training, Patient/Family education, Self Care, Stair training, Vestibular training, DME instructions, Electrical  stimulation, Biofeedback, and Re-evaluation  PLAN FOR NEXT SESSION:  Modify HEP prn for right sided quad and glut strength and high level balance.  Gait training, stair training, pt may benefit from Bioness in the future.  How is the wedge doing?. Continue to work on right LE strengthening with focus on increased knee control.  Continue adv/retreat w/ incline blocks/compliant wedge, have pt practice ambulating up and down incline surfaces like ramp.  Heel taps.  SLS tasks.   Leg  press.  Hip drives, hip strengthening.   Sallyanne Kuster, PTA, [facility] [address] [place], [place] [address] [phone] 12/26/21, 9:41 AM

## 2021-12-26 NOTE — Therapy (Signed)
OUTPATIENT SPEECH LANGUAGE PATHOLOGY TREATMENT NOTE   Patient Name: Denise Herrera MRN: [id number] DOB:1970/09/12, 51 y.o., female Today's Date: 12/26/2021  PCP: Antony Contras MD REFERRING PROVIDER: Cathlyn Parsons, PA-C   END OF SESSION:   End of Session - 12/26/21 0835     Visit Number 5    Number of Visits 17    Date for SLP Re-Evaluation 02/06/22    Authorization Type BC[employer]    SLP Start Time 0933    SLP Stop Time  1015    SLP Time Calculation (min) 42 min    Activity Tolerance Patient tolerated treatment well                Past Medical History:  Diagnosis Date   Chronic insomnia    History of migraine headaches    Narcolepsy without cataplexy(347.00)    Past Surgical History:  Procedure Laterality Date   CESAREAN SECTION     gyn cervical procedure     Patient Active Problem List   Diagnosis Date Noted   ICH (intracerebral hemorrhage) ([place]) 11/03/2021   Essential hypertension 09/05/2015   INSOMNIA, CHRONIC 03/01/2007   MIGRAINE HEADACHE 03/01/2007   Narcolepsy without cataplexy 03/01/2007    ONSET DATE: 11/03/2021    REFERRING DIAG: I61.0 (ICD-10-CM) - Nontraumatic subcortical hemorrhage of left cerebral hemisphere   THERAPY DIAG:  Cognitive communication deficit  Aphasia  Rationale for Evaluation and Treatment Rehabilitation  SUBJECTIVE: "I had a good birthday"  PAIN:  Are you having pain? No  OBJECTIVE:  TODAY'S TREATMENT:  12-26-21: Denied any overt anomia/dysnomia in multiple conversations yesterday on birthday. Recalled recent MD appointment with good accuracy per chart review. Effective time management indicated this morning with no reduced recall of necessary items reported. Targeted verbal expression of functional household tasks and work related tasks, with pt able to provide thorough descriptions with rare min prompting. No overt anomia/dysnomia exhibited with pt able to use technical language as needed. Pt endorsed speech is still  "not as precise" and has difficulty with higher level vocabulary. Will complete HEP for next session. Successful independent return to bill pay and medication management reported with use of automatic reminders. No overt errors endorsed.   12-24-21: Pt has returned to medication management, with pt able to recall types of medications taken and initial letter of names with good accuracy. Pt denied any difficulty with recalling to take medications. Received phone call from MD during session re: management of BP, in which pt exhibited good recall without need for compensations. Pt denied need to write down information to aid delayed recall. Pt returned to grocery shopping without use of list, with no difficulty reported. Targeted higher level word finding (divergent categories), with usual min to mod A required to expand mental flexibility (perseverated on own preferences) after initial 5-6 items named. Updated HEP to include high level word finding.   12-19-21: Pt recalled all necessary items for timely arrival for therapy today. Pt endorsed she is mostly completing ADL routine with rare min A from husband. Pt has returned to some basic cooking and managing bills x4 with reported success. In conversation, no overt anomia exhibited with self-correction of dysnomia appreciated x1. Targeted ID of synonyms to optimize lexical connections, with usual fading to occasional mod A required to aid naming. Pt endorsed feeling "blank" and requested SLP assistance. Discussed functional practice at home and family providing appropriate cues to aid word finding for increased patient benefit.   12-18-21: Pt endorsed missed appointment yesterday due  to forgot medications and glasses at home resulting in late arrival. Husband is currently managing medications, finances, and other household tasks with reported reluctance for wife to return to management (she previously completed independently). Discussed results of CLQT to highlight  reduced error awareness, in which pt noted to explain away some deficits related vision and SLP instructions. SLP generated strategy for patient to request clarification and confirmation at home to aid recall and comprehension. Decline in vocabulary reported, in which SLP targeted generating higher level synonyms with usual mod A required to complete. SLP provided education and handout of anomia cuing hierarchy for family to implement at home versus providing targeted word for her.   12-12-21: Pt expressed some frustration and dislike of formal standardized testing, in which SLP provided rationale to assist with determining current deficits. Reduced intellectual and emergent awareness noted during assessment, which is likely contributing factor to reported feelings. Pt would like to focus on functional tasks and communication, in which SLP emphasized benefit of patient/family to identify deficits and personal goals to be targeted in ST sessions.      PATIENT EDUCATION: Education details: see above Person educated: Patient Education method: Customer service manager Education comprehension: verbalized understanding, returned demonstration, and needs further education     GOALS: Goals reviewed with patient? Yes   SHORT TERM GOALS: Target date: 01/08/2022     Pt will implement word retrieval strategies during functional naming tasks and/or structured conversation given occasional min A over 2 sessions Baseline: 12-24-21, 12-26-21 Goal status: Met   2.  Pt will demonstrate increased emergent  and anticipatory awareness of errors during structured tasks related to home/work given occasional min A over 2 sessions  Baseline: 12-26-21 Goal status: ongoing   3.  Pt will ID functional deficits and verbalize appropriate solutions to optimize performance given occasional min A over 2 sessions Baseline: 12-24-21 Goal status: ongoing   4.  Pt will verbalize detailed synopsis of work related tasks given  occasional min A over 2 sessions  Baseline: 12-26-21 Goal status: ongoing     LONG TERM GOALS: Target date: 02/06/2022     Pt will implement word retrieval strategies during 25+ minute unstructured conversation with ability to provide detailed information on related topic given rare min A over 2 sessions Baseline: 12-26-21 Goal status: ongoing   2.  Pt will verbalize and implement recommended cognitive compensations to optimize performance and task completion at home/work with rare min A over 2 sessions  Baseline: 12-26-21 Goal status: ongoing   3.  Pt will demonstrate increased awareness of functional deficits with pt able to verbalize deficits, identify appropriate strategy, and implement strategy given rare min A over 2 sessions  Baseline:  Goal status: ongoing   4.  Pt will report improved communication effectiveness via PROM by 2 points at last ST session Baseline: CPIB=19 Goal status: ongoing       ASSESSMENT:   CLINICAL IMPRESSION: Patient is a 51 y.o. female who was seen today for CVA in June 2023. [place] significant for ADHD. Conducted ongoing education and training of word retrieval strategies to maximize verbal expression as well as targeted recall for functional household tasks and compensations to optimize patient awareness and return to prior functioning. Skilled ST is recommended to address expressive language and cognitive deficits to optimize return to functional activities and work.    OBJECTIVE IMPAIRMENTS include memory, awareness, executive functioning, and aphasia. These impairments are limiting patient from return to work, household responsibilities, and effectively communicating at home  and in community. Factors affecting potential to achieve goals and functional outcome are cooperation/participation level and previous level of function. Patient will benefit from skilled SLP services to address above impairments and improve overall function.   REHAB POTENTIAL:  Good   PLAN: SLP FREQUENCY: 2x/week   SLP DURATION: 8 weeks   PLANNED INTERVENTIONS: Language facilitation, Cueing hierachy, Cognitive reorganization, Internal/external aids, Functional tasks, Multimodal communication approach, SLP instruction and feedback, Compensatory strategies, and Patient/family education   Marzetta Board, CCC-SLP 12/26/2021, 10:39 AM

## 2021-12-29 DIAGNOSIS — I619 Nontraumatic intracerebral hemorrhage, unspecified: Secondary | ICD-10-CM | POA: Diagnosis not present

## 2021-12-30 ENCOUNTER — Ambulatory Visit: Payer: BLUE CROSS/BLUE SHIELD | Admitting: Physical Therapy

## 2021-12-30 ENCOUNTER — Encounter: Payer: BLUE CROSS/BLUE SHIELD | Admitting: Speech Pathology

## 2021-12-30 ENCOUNTER — Encounter: Payer: Self-pay | Admitting: Physical Therapy

## 2021-12-30 VITALS — BP 152/86 | HR 77

## 2021-12-30 DIAGNOSIS — R2689 Other abnormalities of gait and mobility: Secondary | ICD-10-CM | POA: Diagnosis not present

## 2021-12-30 DIAGNOSIS — I69151 Hemiplegia and hemiparesis following nontraumatic intracerebral hemorrhage affecting right dominant side: Secondary | ICD-10-CM | POA: Diagnosis not present

## 2021-12-30 DIAGNOSIS — R278 Other lack of coordination: Secondary | ICD-10-CM

## 2021-12-30 DIAGNOSIS — R208 Other disturbances of skin sensation: Secondary | ICD-10-CM | POA: Diagnosis not present

## 2021-12-30 DIAGNOSIS — I69153 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: Secondary | ICD-10-CM | POA: Diagnosis not present

## 2021-12-30 DIAGNOSIS — R2681 Unsteadiness on feet: Secondary | ICD-10-CM | POA: Diagnosis not present

## 2021-12-30 DIAGNOSIS — M6281 Muscle weakness (generalized): Secondary | ICD-10-CM

## 2021-12-30 DIAGNOSIS — R41841 Cognitive communication deficit: Secondary | ICD-10-CM | POA: Diagnosis not present

## 2021-12-30 DIAGNOSIS — R4701 Aphasia: Secondary | ICD-10-CM | POA: Diagnosis not present

## 2021-12-30 DIAGNOSIS — R41842 Visuospatial deficit: Secondary | ICD-10-CM | POA: Diagnosis not present

## 2021-12-30 NOTE — Patient Instructions (Signed)
Access Code: [facility] URL: [email or web address] Date: 12/30/2021 Prepared by: Camille Bal  Exercises - Standing Terminal Knee Extension with Resistance  - 1 x daily - 7 x weekly - 3 sets - 10 reps - Forward Step Up with Counter Support  - 1 x daily - 7 x weekly - 3 sets - 10 reps - Standing Single Leg Stance with Counter Support  - 1 x daily - 7 x weekly - 3 sets - 10 reps - Squat  - 1 x daily - 7 x weekly - 3 sets - 10 reps

## 2021-12-30 NOTE — Therapy (Signed)
OUTPATIENT PHYSICAL THERAPY TREATMENT NOTE   Patient Name: Denise Herrera MRN: [id number] DOB:1971-03-08, 51 y.o., female Today's Date: 12/30/2021  PCP: Tally Joe, MD REFERRING PROVIDER: Charlton Amor, PA-C  END OF SESSION:   PT End of Session - 12/30/21 0939     Visit Number 6    Number of Visits 13   12+eval   Date for PT Re-Evaluation 02/06/22   pushed out due to scheduling delay   Authorization Type [employer]    PT Start Time 0930    PT Stop Time 1015    PT Time Calculation (min) 45 min    Equipment Utilized During Treatment Gait belt    Activity Tolerance Patient tolerated treatment well    Behavior During Therapy WFL for tasks assessed/performed             Past Medical History:  Diagnosis Date   Chronic insomnia    History of migraine headaches    Narcolepsy without cataplexy(347.00)    Past Surgical History:  Procedure Laterality Date   CESAREAN SECTION     gyn cervical procedure     Patient Active Problem List   Diagnosis Date Noted   ICH (intracerebral hemorrhage) ([facility]) 11/03/2021   Essential hypertension 09/05/2015   INSOMNIA, CHRONIC 03/01/2007   MIGRAINE HEADACHE 03/01/2007   Narcolepsy without cataplexy 03/01/2007    REFERRING DIAG: I61.0 (ICD-10-CM) - Nontraumatic subcortical hemorrhage of left cerebral hemisphere ([facility])   THERAPY DIAG:  Other abnormalities of gait and mobility  Other lack of coordination  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  Left CVA/ICH confirmed. Admitted to rehab on 11/08/2021 and discharged 11/29/2021.  PRECAUTIONS: Fall  SUBJECTIVE: Pt brought in BP med this session (BP appearing better controlled at  onset of session).  She is out of her Bystolic with PCP aware and refill being placed per report.  Pt denies falls or other acute issues.   PAIN:  Are you having pain? No  VITALS: Before session (LUE):  Today's Vitals   12/30/21 0936  BP: (!) 152/86  Pulse: 77   TODAY'S TREATMENT: Measured heel lift (2 currently) in patient's shoe = 0.5 in.  Pt requesting single heel lift for comfort, upon looking clinic does not have equivalent height in single wedge.  Discussed potential benefit of having 2 separate pieces as pt's quad control progresses.  Assessed gait w/ heel lift no AFO vs heel lift and AFO. GAIT: Gait pattern: step through pattern, decreased step length- Right, decreased stance time- Left, decreased stride length, decreased hip/knee flexion- Right, genu recurvatum- Right, trunk flexed, and poor foot clearance- Right Distance walked: 78' + 115' + 115' Assistive device utilized: Environmental consultant - 2 wheeled and heel wedge all gait, simulated toe cap with second half Level of assistance: SBA and Min A Comments: Pt ambulates initial lap w/o AFO (just heel lift on right LE) with good quad control.  Second lap with AFO and heel lift with patient demonstrating worsened hyperextension.  Returned to ambulation without AFO w/ continued hyperetension likely due to ongoing LE fatigue.  RAMP:  Level of Assistance: SBA Assistive device utilized: Walker - 2 wheeled Ramp Comments: Pt ambulates up and down ramp x3 w/ min cues to promote quad engagement on descent.  -holding bilateral knees in soft  flexion on decline ramp x30 sec > squats on decline x12 w BUE support on RW, min cues for pelvic weight shift to right -Leg press BLE 12x60#, 2x12 at 70# w/ focus on right quad control and pt finding point just prior to knee hyperextension w/ 1 sec hold  PATIENT EDUCATION: Education details: Discussed tone noted from last session in weight bearing in conjunction with tone pt describes as toe curling when she is  doing excessive weigh bearing.  Discussed talking to her PCP about med options if it continues, pt not open to this at this time. Continue with current HEP and walk regularly. Person educated: Patient Education method: Explanation Education comprehension: verbalized understanding   HOME EXERCISE PROGRAM: Access Code: [facility] URL: [email or web address] Date: 12/30/2021 Prepared by: Camille Bal  Exercises - Standing Terminal Knee Extension with Resistance  - 1 x daily - 7 x weekly - 3 sets - 10 reps - Forward Step Up with Counter Support  - 1 x daily - 7 x weekly - 3 sets - 10 reps - Standing Single Leg Stance with Counter Support  - 1 x daily - 7 x weekly - 3 sets - 10 reps - Squat  - 1 x daily - 7 x weekly - 3 sets - 10 reps  GOALS: Goals reviewed with patient? Yes  SHORT TERM GOALS: Target date: 01/02/2022  Pt will be independent with strength and balance HEP with supervision from family as needed. Baseline:  To be established. Goal status: INITIAL  2.  Pt will navigate 4 stairs w/o handrails reciprocally at supervision level to improve safety with functional mobility in her home. Baseline:  step-to using lean against half wall/rail Goal status: INITIAL  3.  Pt will ambulate >/=375 feet over indoor surfaces with LRAD and supervision level of assist to promote household and community access. Baseline: 254' SBA RW Goal status: INITIAL  4.  Pt will decrease 5xSTS to </=13 seconds in order to demonstrate decreased risk for falls and improved functional bilateral LE strength and power. Baseline: 17.03 sec w/ LUE Goal status: INITIAL  5.  Pt will ambulate>/=300 feet on to demonstrate improved endurance for functional tasks in home and community. Baseline: 18' w/ RW Goal status: INITIAL  6.  Pt will demonstrate a gait speed of >/=2.20 feet/sec in order to decrease risk for falls. Baseline: 1.90 ft w/ RW Goal status: INITIAL  LONG TERM GOALS: Target  date: 01/23/2022  Pt will increase FOTO score to >/=75% in order to demonstrate subjective functional improvement. Baseline: 68% Goal status: INITIAL  2.  Pt will navigate 16 stairs using no rails supervision level reciprocally to improve safe functional mobility in home environment. Baseline: 4 step-to leaning against half wall/rail Goal status: INITIAL  3.  Pt will ambulate >/=750 feet with LRAD and ModI level of assist over various indoor and outdoor surfaces to promote household and community access. Baseline: 254' SBA RW Goal status: INITIAL  4.  Pt will demonstrate a gait speed of >/=2.4 feet/sec in order to decrease risk for falls. Baseline: 1.90 ft/sec Goal status: INITIAL  ASSESSMENT:  CLINICAL IMPRESSION: Patient's BP appeared better controlled at onset of session today allowing better progression of training.  Gait assessed w/ and w/o AFO today to further assess benefit of 0.5 inch heel lift w/ pt remaining limited by decreased kinesthetic awareness of right knee and increased quad fatigue with distances >120'.  Used leg press this session to further promote awareness of knee positioning and trialed  incline exercises to upweight right weight shifting for quad engagement.  Upon discussion with pt and review of note from session prior pt seems to have mild extensor tone, she is not interested in medical management of this at this time so will continue as able in PT.  She is making progress towards LTGs with skilled PT and will likely continue to benefit from ongoing skilled POC.  OBJECTIVE IMPAIRMENTS Abnormal gait, decreased balance, decreased cognition, decreased endurance, and decreased strength.   ACTIVITY LIMITATIONS carrying, lifting, and locomotion level  PARTICIPATION LIMITATIONS: driving, community activity, occupation, and yard work  PERSONAL FACTORS Age, Fitness, Transportation, and 1 comorbidity: HTN are also affecting patient's functional outcome.   REHAB POTENTIAL:  Excellent  CLINICAL DECISION MAKING: Stable/uncomplicated  EVALUATION COMPLEXITY: Low  PLAN: PT FREQUENCY: 2x/week  PT DURATION: 6 weeks  PLANNED INTERVENTIONS: Therapeutic exercises, Therapeutic activity, Neuromuscular re-education, Balance training, Gait training, Patient/Family education, Self Care, Stair training, Vestibular training, DME instructions, Electrical stimulation, Biofeedback, and Re-evaluation  PLAN FOR NEXT SESSION:  Assess STGs!  Modify HEP prn for right sided quad and glut strength and high level balance.  Gait training, stair training, pt may benefit from Bioness in the future.  Continue to work on right LE strengthening with focus on increased knee control.  Continue adv/retreat w/ incline blocks/compliant wedge, have pt practice ambulating up and down incline surfaces like ramp.  Heel taps.  SLS tasks.   Leg press.  Hip drives, hip strengthening.   Camille Bal, PT, DPT [facility] [address], [address] [place], [place] [address] [phone] 12/30/21, 11:35 AM

## 2022-01-02 ENCOUNTER — Ambulatory Visit: Payer: BLUE CROSS/BLUE SHIELD | Admitting: Physical Therapy

## 2022-01-02 ENCOUNTER — Encounter: Payer: Self-pay | Admitting: Physical Therapy

## 2022-01-02 ENCOUNTER — Ambulatory Visit: Payer: BLUE CROSS/BLUE SHIELD | Admitting: Occupational Therapy

## 2022-01-02 DIAGNOSIS — M6281 Muscle weakness (generalized): Secondary | ICD-10-CM | POA: Diagnosis not present

## 2022-01-02 DIAGNOSIS — I69151 Hemiplegia and hemiparesis following nontraumatic intracerebral hemorrhage affecting right dominant side: Secondary | ICD-10-CM | POA: Diagnosis not present

## 2022-01-02 DIAGNOSIS — I69153 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: Secondary | ICD-10-CM

## 2022-01-02 DIAGNOSIS — R2681 Unsteadiness on feet: Secondary | ICD-10-CM

## 2022-01-02 DIAGNOSIS — R4701 Aphasia: Secondary | ICD-10-CM | POA: Diagnosis not present

## 2022-01-02 DIAGNOSIS — R278 Other lack of coordination: Secondary | ICD-10-CM | POA: Diagnosis not present

## 2022-01-02 DIAGNOSIS — R41842 Visuospatial deficit: Secondary | ICD-10-CM | POA: Diagnosis not present

## 2022-01-02 DIAGNOSIS — R2689 Other abnormalities of gait and mobility: Secondary | ICD-10-CM

## 2022-01-02 DIAGNOSIS — R208 Other disturbances of skin sensation: Secondary | ICD-10-CM

## 2022-01-02 DIAGNOSIS — R41841 Cognitive communication deficit: Secondary | ICD-10-CM | POA: Diagnosis not present

## 2022-01-02 NOTE — Patient Instructions (Signed)
  Coordination Activities  Perform the following activities for 20 minutes 1 times per day with right hand(s).   Rotate ball in fingertips (clockwise and counter-clockwise).  Toss ball between hands.  Toss ball in air and catch with the same hand.  Flip cards 1 at a time as fast as you can.  Deal cards with your thumb (Hold deck in hand and push card off top with thumb).  Pick up coins, buttons, marbles, dried beans/pasta of different sizes and place in container.  Pick up coins and place in container or coin bank.  Pick up coins and stack.  Pick up coins one at a time until you get 5-10 in your hand, then move coins from palm to fingertips to stack one at a time.  Practice writing and/or typing. 

## 2022-01-02 NOTE — Therapy (Signed)
OUTPATIENT OCCUPATIONAL THERAPY NEURO Treatment  Patient Name: Denise Herrera MRN: [id number] DOB:Jul 21, 1970, 51 y.o., female Today's Date: 01/02/2022  PCP: Dr. Antony Contras REFERRING PROVIDER: Cathlyn Parsons, PA-C    OT End of Session - 01/02/22 0841     Visit Number 2    Number of Visits 25    Date for OT Re-Evaluation 03/11/22    Authorization Type BC[employer], covered 100% (OOP met), visit limit--MN    OT Start Time 0802    OT Stop Time 0843    OT Time Calculation (min) 41 min              Past Medical History:  Diagnosis Date   Chronic insomnia    History of migraine headaches    Narcolepsy without cataplexy(347.00)    Past Surgical History:  Procedure Laterality Date   CESAREAN SECTION     gyn cervical procedure     Patient Active Problem List   Diagnosis Date Noted   ICH (intracerebral hemorrhage) ([place]) 11/03/2021   Essential hypertension 09/05/2015   INSOMNIA, CHRONIC 03/01/2007   MIGRAINE HEADACHE 03/01/2007   Narcolepsy without cataplexy 03/01/2007    ONSET DATE: 11/05/21  REFERRING DIAG: I61.0 (ICD-10-CM) - Nontraumatic subcortical hemorrhage of left cerebral hemisphere ([place])   THERAPY DIAG:  Other lack of coordination  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 (HCC)  Muscle weakness (generalized)  Hemiplegia and hemiparesis following nontraumatic intracerebral hemorrhage affecting right dominant side (HCC)  Visuospatial deficit  Other disturbances of skin sensation  Unsteadiness on feet  Other abnormalities of gait and mobility  Rationale for Evaluation and Treatment Rehabilitation  SUBJECTIVE:   SUBJECTIVE STATEMENT: "I need to be able to take care of my house and get back to work"  Pt accompanied by: significant other present for first half of eval (then had to leave for work)  PERTINENT HISTORY: 11/05/2021 with acute onset of right-sided weakness and aphasia. Blood  pressure mildly elevated 141/85. CT of the head showed acute parenchymal hemorrhage in the left corona radiata with intraventricular extension.  Hospital d/c 11/28/21.  PMH:  history of ADHD maintained on Ritalin, migraine headaches, hypertension, hyperlipidemia, hx of narcolepsy without cataplex, Hx of R RTC repair 03/2021.     PRECAUTIONS: Fall  WEIGHT BEARING RESTRICTIONS No  PAIN:  Are you having pain? No  FALLS: Has patient fallen in last 6 months? 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 See pt. Instructions Handwriting activity with good legibility and letter size, min difficulty   PATIENT EDUCATION: Education details:coordination HEP Person educated: Patient Education method: Explanation Education comprehension: verbalized understanding   HOME EXERCISE PROGRAM: 8/18/23coordination HEP    GOALS: Potential Goals reviewed with patient? Yes  SHORT TERM GOALS: Target date: 01/11/22  Pt will be independent with initial HEP. Goal status: INITIAL  2.  Pt will improve dominant RUE coordination/functional reaching for ADLs as shown by improving score on box and blocks test by at least 8. Baseline: 34 blocks Goal status: INITIAL  3.  Pt will use RUE as dominant UE for eating and grooming 100% of  the time. Goal status: INITIAL  4.  Pt will perform simple home maintenance tasks with supervision. Goal status: INITIAL  5.  Pt will perform environmental scanning/navigation with at least 95% accuracy in busy environment. Goal status: INITIAL  6.  Pt will be able to write  at least 2 sentences with good legibility in reasonable amount of time. Goal status: INITIAL  LONG TERM GOALS: Target date: 03/11/22  Pt will be independent with updated HEP Goal status: INITIAL  2.  Pt will improve R hand coordination for ADLs as shown by completing 9-hole peg test in less than 90sec. Baseline: only able to put in 1 peg within 74mn Goal status: INITIAL  3.  Pt will use RUE as dominant UE for eating and grooming at least 75% of the time. Goal status: INITIAL  4.  Pt will perform simple-mod complex home maintenance tasks mod I. Goal status: INITIAL  5.  Pt will improve RUE strength to be be able to retrieve 3lb object from overhead shelf safely x3. Goal status: INITIAL  6.  Pt will improve R grip strength to at least 30lbs to assist in lifting objects/opening containers. Goal status: INITIAL  7.  Pt will perform environmental scanning/navigation in busy environment with at least 95% accuracy for incr safety.   Goal status: INITIAL  ASSESSMENT:  CLINICAL IMPRESSION: Pt demonstrates understanding of coordination HEP.   PERFORMANCE DEFICITS in functional skills including ADLs, IADLs, coordination, dexterity, sensation, ROM, strength, FMC, GMC, mobility, balance, decreased knowledge of precautions, decreased knowledge of use of DME, vision, and UE functional use, cognitive skills including memory, and psychosocial skills including habits.   IMPAIRMENTS are limiting patient from ADLs, IADLs, work, leisure, and social participation.   COMORBIDITIES may have co-morbidities  that affects occupational performance. Patient will benefit from skilled OT to address above impairments and improve overall function.  MODIFICATION OR ASSISTANCE TO COMPLETE EVALUATION: Min-Moderate modification of tasks or assist with assess necessary to complete an evaluation.  OT OCCUPATIONAL PROFILE AND HISTORY: Detailed assessment: Review of records and additional review of physical,  cognitive, psychosocial history related to current functional performance.  CLINICAL DECISION MAKING: Moderate - several treatment options, min-mod task modification necessary  REHAB POTENTIAL: Good  EVALUATION COMPLEXITY: Moderate    PLAN: OT FREQUENCY: 2x/week  OT DURATION: 12 weeks +eval  PLANNED INTERVENTIONS: self care/ADL training, therapeutic exercise, therapeutic activity, neuromuscular re-education, manual therapy, passive range of motion, balance training, functional mobility training, aquatic therapy, splinting, electrical stimulation, ultrasound, paraffin, fluidotherapy, moist heat, cryotherapy, patient/family education, cognitive remediation/compensation, visual/perceptual remediation/compensation, energy conservation, and DME and/or AE instructions  RECOMMENDED OTHER SERVICES: none at this time  CONSULTED AND AGREED WITH PLAN OF CARE: Patient  PLAN FOR NEXT SESSION:  neuro re-ed RUE   Jolea Dolle, OTR/L 01/02/2022, 10:59 AM

## 2022-01-02 NOTE — Therapy (Signed)
OUTPATIENT PHYSICAL THERAPY TREATMENT NOTE   Patient Name: Denise Herrera MRN: [id number] DOB:1970/06/12, 51 y.o., female Today's Date: 01/02/2022  PCP: Antony Contras, MD REFERRING PROVIDER: Cathlyn Parsons, PA-C  END OF SESSION:   PT End of Session - 01/02/22 0847     Visit Number 7    Number of Visits 13   12+eval   Date for PT Re-Evaluation 02/06/22   pushed out due to scheduling delay   Authorization Type [employer]    PT Start Time [phone]    PT Stop Time 0928    PT Time Calculation (min) 42 min    Equipment Utilized During Treatment Gait belt    Activity Tolerance Patient tolerated treatment well    Behavior During Therapy WFL for tasks assessed/performed             Past Medical History:  Diagnosis Date   Chronic insomnia    History of migraine headaches    Narcolepsy without cataplexy(347.00)    Past Surgical History:  Procedure Laterality Date   CESAREAN SECTION     gyn cervical procedure     Patient Active Problem List   Diagnosis Date Noted   ICH (intracerebral hemorrhage) ([place]) 11/03/2021   Essential hypertension 09/05/2015   INSOMNIA, CHRONIC 03/01/2007   MIGRAINE HEADACHE 03/01/2007   Narcolepsy without cataplexy 03/01/2007    REFERRING DIAG: I61.0 (ICD-10-CM) - Nontraumatic subcortical hemorrhage of left cerebral hemisphere ([place])   THERAPY DIAG:  Unsteadiness on feet  Other abnormalities of gait and mobility  Muscle weakness (generalized)  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  Left CVA/ICH confirmed. Admitted to rehab on 11/08/2021 and discharged 11/29/2021.  PRECAUTIONS: Fall  SUBJECTIVE: No new complaints. No falls or pain to report.    PAIN:  Are you having  pain? No  VITALS: Before session (LUE):  There were no vitals filed for this visit.   TODAY'S TREATMENT:   [facility] PT Assessment - 01/02/22 0851       6 Minute Walk- Baseline   6 Minute Walk- Baseline yes    BP (mmHg) 149/96    HR (bpm) 62    02 Sat (%RA) 100 %    Modified Borg Scale for Dyspnea 0- Nothing at all    Perceived Rate of Exertion (Borg) 6-      6 Minute walk- Post Test   6 Minute Walk Post Test yes    BP (mmHg) 143/99    HR (bpm) 73    02 Sat (%RA) 100 %    Modified Borg Scale for Dyspnea 0- Nothing at all    Perceived Rate of Exertion (Borg) 12-      6 minute walk test results    Aerobic Endurance Distance Walked 850    Endurance additional comments 2 minute walk test- 280 feet, pt able to keep going to get baseline for 6 minute walk. use RW and right AFO with no rest breaks needed            BP after 5 minute seated rest after walk test: BP 140/110, HR 68.  After another 5 minutes of quiet rest finding a "happy place" to relax: 137/97   GAIT: Gait pattern: step through pattern, decreased step length- Left, decreased stance time- Right, and narrow BOS Distance walked: see  above for testing, plus around clinic with session Assistive device utilized: Walker - 2 wheeled and right AFO Level of assistance: SBA Comments: occasional cues on posture and to stay closer to walker with gait.  Gait speed: 12 seconds= 2.74 ft/sec with RW/AFO  STRENGTHENING  Reviewed HEP. Remains challenging with pt able to verbalize which ones she is doing at home 5 time sit to stand: 13.69 seconds no UE support from standard height surface  BP at end of session: BP 147/97, HR 64      PATIENT EDUCATION: Education details: progress toward goals checked today Person educated: Patient Education method: Explanation Education comprehension: verbalized understanding   HOME EXERCISE PROGRAM: Access Code: [facility] URL: [email or web address] Date:  12/30/2021 Prepared by: Elease Etienne  Exercises - Standing Terminal Knee Extension with Resistance  - 1 x daily - 7 x weekly - 3 sets - 10 reps - Forward Step Up with Counter Support  - 1 x daily - 7 x weekly - 3 sets - 10 reps - Standing Single Leg Stance with Counter Support  - 1 x daily - 7 x weekly - 3 sets - 10 reps - Squat  - 1 x daily - 7 x weekly - 3 sets - 10 reps  GOALS: Goals reviewed with patient? Yes  SHORT TERM GOALS: Target date: 01/02/2022  Pt will be independent with strength and balance HEP with supervision from family as needed. Baseline:  01/02/22: met with current HEP Goal status: MET  2.  Pt will navigate 4 stairs w/o handrails reciprocally at supervision level to improve safety with functional mobility in her home. Baseline:  step-to using lean against half wall/rail Goal status: ONGOING  3.  Pt will ambulate >/=375 feet over indoor surfaces with LRAD and supervision level of assist to promote household and community access. Baseline: 01/02/22: 850 feet with RW today  Goal status: MET  4.  Pt will decrease 5xSTS to </=13 seconds in order to demonstrate decreased risk for falls and improved functional bilateral LE strength and power. Baseline: 01/02/22: 13.69 sec's no UE support Goal status: MET  5.  Pt will ambulate>/=300 feet on 2MWT to demonstrate improved endurance for functional tasks in home and community. Baseline:  01/02/22: 280 feet, improved by 45 feet, just not to goal Goal status: MET  6.  Pt will demonstrate a gait speed of >/=2.20 feet/sec in order to decrease risk for falls. Baseline: 01/02/22: 2.74 ft/sec with RW Goal status: MET  LONG TERM GOALS: Target date: 01/23/2022  Pt will increase FOTO score to >/=75% in order to demonstrate subjective functional improvement. Baseline: 68% Goal status: INITIAL  2.  Pt will navigate 16 stairs using no rails supervision level reciprocally to improve safe functional mobility in home  environment. Baseline: 4 step-to leaning against half wall/rail Goal status: INITIAL  3.  Pt will ambulate >/=750 feet with LRAD and ModI level of assist over various indoor and outdoor surfaces to promote household and community access. Baseline: 254' SBA RW Goal status: INITIAL  4.  Pt will demonstrate a gait speed of >/=2.4 feet/sec in order to decrease risk for falls. Baseline: 1.90 ft/sec Goal status: INITIAL  ASSESSMENT:  CLINICAL IMPRESSION: Today's skilled session focused on progress toward STGs with all goals checked today met. Pt's BP did go up with activity and decreased with rest breaks. The pt is making progress toward goals and should benefit from continued PT to progress toward unmet goals.   OBJECTIVE IMPAIRMENTS Abnormal gait, decreased balance, decreased  cognition, decreased endurance, and decreased strength.   ACTIVITY LIMITATIONS carrying, lifting, and locomotion level  PARTICIPATION LIMITATIONS: driving, community activity, occupation, and yard work  PERSONAL FACTORS Age, Fitness, Transportation, and 1 comorbidity: HTN are also affecting patient's functional outcome.   REHAB POTENTIAL: Excellent  CLINICAL DECISION MAKING: Stable/uncomplicated  EVALUATION COMPLEXITY: Low  PLAN: PT FREQUENCY: 2x/week  PT DURATION: 6 weeks  PLANNED INTERVENTIONS: Therapeutic exercises, Therapeutic activity, Neuromuscular re-education, Balance training, Gait training, Patient/Family education, Self Care, Stair training, Vestibular training, DME instructions, Electrical stimulation, Biofeedback, and Re-evaluation  PLAN FOR NEXT SESSION:  Check remaining STG. Modify HEP prn for right sided quad and glut strength and high level balance.  Gait training, stair training, pt may benefit from Bioness in the future.  Continue to work on right LE strengthening with focus on increased knee control.  Continue adv/retreat w/ incline blocks/compliant wedge, have pt practice ambulating up and  down incline surfaces like ramp.  Heel taps.  SLS tasks.   Leg press.  Hip drives, hip strengthening.   Willow Ora, PTA, [facility] [id number] [place], [place] [address] [phone] 01/02/22, 12:58 PM

## 2022-01-07 ENCOUNTER — Ambulatory Visit: Payer: BLUE CROSS/BLUE SHIELD | Admitting: Physical Therapy

## 2022-01-07 ENCOUNTER — Ambulatory Visit: Payer: BLUE CROSS/BLUE SHIELD | Admitting: Speech Pathology

## 2022-01-07 ENCOUNTER — Encounter: Payer: Self-pay | Admitting: Physical Therapy

## 2022-01-07 ENCOUNTER — Encounter: Payer: Self-pay | Admitting: Occupational Therapy

## 2022-01-07 ENCOUNTER — Ambulatory Visit: Payer: BLUE CROSS/BLUE SHIELD | Admitting: Occupational Therapy

## 2022-01-07 DIAGNOSIS — R2681 Unsteadiness on feet: Secondary | ICD-10-CM | POA: Diagnosis not present

## 2022-01-07 DIAGNOSIS — R2689 Other abnormalities of gait and mobility: Secondary | ICD-10-CM

## 2022-01-07 DIAGNOSIS — M6281 Muscle weakness (generalized): Secondary | ICD-10-CM

## 2022-01-07 DIAGNOSIS — R278 Other lack of coordination: Secondary | ICD-10-CM

## 2022-01-07 DIAGNOSIS — R208 Other disturbances of skin sensation: Secondary | ICD-10-CM

## 2022-01-07 DIAGNOSIS — R4701 Aphasia: Secondary | ICD-10-CM

## 2022-01-07 DIAGNOSIS — R41841 Cognitive communication deficit: Secondary | ICD-10-CM

## 2022-01-07 DIAGNOSIS — I69153 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: Secondary | ICD-10-CM

## 2022-01-07 DIAGNOSIS — R41842 Visuospatial deficit: Secondary | ICD-10-CM | POA: Diagnosis not present

## 2022-01-07 DIAGNOSIS — I69151 Hemiplegia and hemiparesis following nontraumatic intracerebral hemorrhage affecting right dominant side: Secondary | ICD-10-CM

## 2022-01-07 NOTE — Patient Instructions (Signed)
Scapular Retraction (Prone)    Lie with arms at sides. Pinch shoulder blades together and down towards bottom. Repeat __10__ times per set.  Do __2__ sessions per day.  On Elbows (Prone)    Rise up on elbows as high as possible, keeping hips and stomach on floor/bed. Keep Rt elbow from sliding out, do NOT lift at neck. Hold __5__ seconds. Repeat _10___ times per set.  Do __2__ sessions per day.  SHOULDER: Flexion - Supine (Cane)    Hold 2 lb dumbbell in both hands (palms facing). Raise arms up overhead. Do not allow back to arch. Go slowly and allow arms to bend slightly when coming back down.  _10__ reps per set, __2_ sets per day

## 2022-01-07 NOTE — Therapy (Signed)
OUTPATIENT OCCUPATIONAL THERAPY NEURO Treatment  Patient Name: Denise Herrera MRN: [id number] DOB:1971/04/03, 51 y.o., female Today's Date: 01/07/2022  PCP: Dr. Antony Contras REFERRING PROVIDER: Cathlyn Parsons, PA-C    OT End of Session - 01/07/22 0803     Visit Number 3    Number of Visits 25    Date for OT Re-Evaluation 03/11/22    Authorization Type [employer], covered 100% (OOP met), visit limit--MN    OT Start Time 0802    OT Stop Time 0845    OT Time Calculation (min) 43 min    Activity Tolerance Patient tolerated treatment well    Behavior During Therapy WFL for tasks assessed/performed              Past Medical History:  Diagnosis Date   Chronic insomnia    History of migraine headaches    Narcolepsy without cataplexy(347.00)    Past Surgical History:  Procedure Laterality Date   CESAREAN SECTION     gyn cervical procedure     Patient Active Problem List   Diagnosis Date Noted   ICH (intracerebral hemorrhage) ([place]) 11/03/2021   Essential hypertension 09/05/2015   INSOMNIA, CHRONIC 03/01/2007   MIGRAINE HEADACHE 03/01/2007   Narcolepsy without cataplexy 03/01/2007    ONSET DATE: 11/05/21  REFERRING DIAG: I61.0 (ICD-10-CM) - Nontraumatic subcortical hemorrhage of left cerebral hemisphere ([place])   THERAPY DIAG:  Hemiplegia and hemiparesis following nontraumatic intracerebral hemorrhage affecting right dominant side ([place])  Other lack of coordination  Other disturbances of skin sensation  Rationale for Evaluation and Treatment Rehabilitation  SUBJECTIVE:   SUBJECTIVE STATEMENT: "I had rotator cuff surgery on my Rt shoulder Nov 2022, but it was laser sx so I recovered quicker."  Pt accompanied by: significant other present for first half of eval (then had to leave for work)  PERTINENT HISTORY: 11/05/2021 with acute onset of right-sided weakness and aphasia. Blood pressure mildly elevated 141/85. CT of the head showed acute parenchymal hemorrhage in the  left corona radiata with intraventricular extension.  Hospital d/c 11/28/21.  PMH:  history of ADHD maintained on Ritalin, migraine headaches, hypertension, hyperlipidemia, hx of narcolepsy without cataplex, Hx of R RTC repair 03/2021.     PRECAUTIONS: Fall  WEIGHT BEARING RESTRICTIONS No  PAIN:  Are you having pain? No  FALLS: Has patient fallen in last 6 months? 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, accounting, physically, lifting up to 50lbs), and Leisure: sleep , shopping/thrift shopping, dance, skate   PATIENT GOALS   get back to doing things around the house, returning to work   OBJECTIVE:     TODAY'S TREATMENT:    Functional reaching to place graded clothespins on antenna RUE (yellow to green resistance) w/ min to mod cues to prevent shoulder abduction and promote forearm rotation  UBE x 5 min, level 1 for normal reciprocal movement pattern and UB conditioning w/ cues for positioning  Pt issued HEP for neuro re-education and proximal shoulder strengthening - see pt instructions for details. Pt instructed how to safely get in/out of prone position and to prevent compensations at shoulder    PATIENT EDUCATION: Education details: shoulder HEP  Person educated: Patient Education method: Explanation, Demonstration, Verbal cues, and Handouts Education comprehension: verbalized understanding, returned demonstration, and verbal cues required    HOME EXERCISE PROGRAM: 8/18/23coordination HEP 01/07/22: Shoulder HEP    GOALS: Potential Goals reviewed  with patient? Yes  SHORT TERM GOALS: Target date: 01/11/22  Pt will be independent with initial HEP. Goal status: IN PROGRESS  2.  Pt will improve dominant RUE coordination/functional reaching for ADLs as shown by improving score on box and blocks test by at least 8. Baseline: 34  blocks Goal status: IN PROGRESS  3.  Pt will use RUE as dominant UE for eating and grooming 100% of the time. Goal status: IN PROGRESS  4.  Pt will perform simple home maintenance tasks with supervision. Goal status: INITIAL  5.  Pt will perform environmental scanning/navigation with at least 95% accuracy in busy environment. Goal status: INITIAL  6.  Pt will be able to write at least 2 sentences with good legibility in reasonable amount of time. Goal status: IN PROGRESS  LONG TERM GOALS: Target date: 03/11/22  Pt will be independent with updated HEP Goal status: INITIAL  2.  Pt will improve R hand coordination for ADLs as shown by completing 9-hole peg test in less than 90sec. Baseline: only able to put in 1 peg within 76mn Goal status: INITIAL  3.  Pt will use RUE as dominant UE for eating and grooming at least 75% of the time. Goal status: INITIAL  4.  Pt will perform simple-mod complex home maintenance tasks mod I. Goal status: INITIAL  5.  Pt will improve RUE strength to be be able to retrieve 3lb object from overhead shelf safely x3. Goal status: INITIAL  6.  Pt will improve R grip strength to at least 30lbs to assist in lifting objects/opening containers. Goal status: INITIAL  7.  Pt will perform environmental scanning/navigation in busy environment with at least 95% accuracy for incr safety.   Goal status: INITIAL  ASSESSMENT:  CLINICAL IMPRESSION: Pt progressing towards STG's. Pt w/ fluctuating mild tone (dystonia).   PERFORMANCE DEFICITS in functional skills including ADLs, IADLs, coordination, dexterity, sensation, ROM, strength, FMC, GMC, mobility, balance, decreased knowledge of precautions, decreased knowledge of use of DME, vision, and UE functional use, cognitive skills including memory, and psychosocial skills including habits.   IMPAIRMENTS are limiting patient from ADLs, IADLs, work, leisure, and social participation.   COMORBIDITIES may have  co-morbidities  that affects occupational performance. Patient will benefit from skilled OT to address above impairments and improve overall function.  MODIFICATION OR ASSISTANCE TO COMPLETE EVALUATION: Min-Moderate modification of tasks or assist with assess necessary to complete an evaluation.  OT OCCUPATIONAL PROFILE AND HISTORY: Detailed assessment: Review of records and additional review of physical, cognitive, psychosocial history related to current functional performance.  CLINICAL DECISION MAKING: Moderate - several treatment options, min-mod task modification necessary  REHAB POTENTIAL: Good  EVALUATION COMPLEXITY: Moderate    PLAN: OT FREQUENCY: 2x/week  OT DURATION: 12 weeks +eval  PLANNED INTERVENTIONS: self care/ADL training, therapeutic exercise, therapeutic activity, neuromuscular re-education, manual therapy, passive range of motion, balance training, functional mobility training, aquatic therapy, splinting, electrical stimulation, ultrasound, paraffin, fluidotherapy, moist heat, cryotherapy, patient/family education, cognitive remediation/compensation, visual/perceptual remediation/compensation, energy conservation, and DME and/or AE instructions  RECOMMENDED OTHER SERVICES: none at this time  CONSULTED AND AGREED WITH PLAN OF CARE: Patient  PLAN FOR NEXT SESSION:  continue neuro re-ed RUE, fx'al reaching and coordination RUE   KHans Eden OTR/L 01/07/2022, 8:03 AM

## 2022-01-07 NOTE — Patient Instructions (Signed)
It is important for you to be doing things.   Being active (in a safe manner) is good for your body, your mental health, how your brain works.   Engaging in activities that require you to think are going to help your brain.   The best activities to improve cognition are functional, real life activities that are important to you:  Listen to and discuss [employer], Podcasts, audiobooks Read and discuss short articles of interest to you- Take notes on these if memory is a challenge Discuss social media posts Look and discuss photo albums Plan a menu Participate in household chores and decisions Participate in managing finances Plan a party, trip or tailgate with all of the details (even if you aren't really going to carry it out) Participate in your hobby as you are able with assistance Manage your texts, emails with supervision if needed. Google search for items (even if you're not really going to buy anything) and compare prices and features Socialize -  however, too many visitors can be overwhelming, so set limits "My doctor said I should only visit (or talk) for 20 minutes" or "I do better when I visit with just 1-2 people at a time for 20 minutes"  App:  NeuroHQ

## 2022-01-07 NOTE — Therapy (Signed)
OUTPATIENT PHYSICAL THERAPY TREATMENT NOTE   Patient Name: Denise Herrera MRN: [id number] DOB:11-03-1970, 51 y.o., female Today's Date: 01/07/2022  PCP: Antony Contras, MD REFERRING PROVIDER: Cathlyn Parsons, PA-C  END OF SESSION:   PT End of Session - 01/07/22 0850     Visit Number 8    Number of Visits 13   12+eval   Date for PT Re-Evaluation 02/06/22   pushed out due to scheduling delay   Authorization Type BLUE [employer]    PT Start Time 0848    PT Stop Time 0930    PT Time Calculation (min) 42 min    Equipment Utilized During Treatment Gait belt    Activity Tolerance Patient tolerated treatment well    Behavior During Therapy WFL for tasks assessed/performed             Past Medical History:  Diagnosis Date   Chronic insomnia    History of migraine headaches    Narcolepsy without cataplexy(347.00)    Past Surgical History:  Procedure Laterality Date   CESAREAN SECTION     gyn cervical procedure     Patient Active Problem List   Diagnosis Date Noted   ICH (intracerebral hemorrhage) ([place]) 11/03/2021   Essential hypertension 09/05/2015   INSOMNIA, CHRONIC 03/01/2007   MIGRAINE HEADACHE 03/01/2007   Narcolepsy without cataplexy 03/01/2007    REFERRING DIAG: I61.0 (ICD-10-CM) - Nontraumatic subcortical hemorrhage of left cerebral hemisphere ([place])   THERAPY DIAG:  Unsteadiness on feet  Other abnormalities of gait and mobility  Muscle weakness (generalized)  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  Left CVA/ICH confirmed. Admitted to rehab on 11/08/2021 and discharged 11/29/2021.  PRECAUTIONS: Fall  SUBJECTIVE: No new complaints. No falls or pain to report.    PAIN:  Are you having  pain? No  VITALS: Before session (LUE): BP 156/97 There were no vitals filed for this visit.   TODAY'S TREATMENT:   GAIT: Gait pattern: step through pattern, decreased step length- Left, decreased stance time- Right, and narrow BOS Distance walked: 115 x 2 with cane, plus around clinic with session with cane Assistive device utilized: Walker - 2 wheeled and right AFO, straight cane with rubber quad tip Level of assistance: SBA with walker; CGA to min assist with cane Comments: BP 148/93 after 2 laps of gait   STAIRS: Level of Assistance: SBA Stair Negotiation Technique: Alternating Pattern  Forwards with Bilateral Rails Number of Stairs: 4  x 2 reps  Height of Stairs: 6  Comments: cues for weight shifting and to advance hands along rails   STRENGTHENING  NuStep UE/LE level 4  x 8 minutes with goal >/= 60 steps per minute for strengthening, reciprocal movements and activity tolerance. BP after use of NuStep 149/91.    BALANCE/NMR: Standing on airex: feet together for EC 30 seconds x 3 reps, then feet hip width apart for EC head movements left<>right, up<>down for ~10 reps each. Min guard to min assist with cues on posture and weight shifting.     PATIENT EDUCATION: Education details: progress toward remaining STGs. Continued with current HEP Person educated: Patient Education method: Explanation Education comprehension: verbalized understanding   HOME EXERCISE PROGRAM: Access Code: [facility] URL: [email or web address] Date: 12/30/2021 Prepared by: Elease Etienne  Exercises - Standing Terminal Knee Extension with Resistance  -  1 x daily - 7 x weekly - 3 sets - 10 reps - Forward Step Up with Counter Support  - 1 x daily - 7 x weekly - 3 sets - 10 reps - Standing Single Leg Stance with Counter Support  - 1 x daily - 7 x weekly - 3 sets - 10 reps - Squat  - 1 x daily - 7 x weekly - 3 sets - 10 reps  GOALS: Goals reviewed with patient? Yes  SHORT  TERM GOALS: Target date: 01/02/2022  Pt will be independent with strength and balance HEP with supervision from family as needed. Baseline:  01/02/22: met with current HEP Goal status: MET  2.  Pt will navigate 4 stairs w/o handrails reciprocally at supervision level to improve safety with functional mobility in her home. Baseline:  01/07/22: able to use reciprocal pattern, however continues to need rails.  Goal status: PARTIALLY MET  3.  Pt will ambulate >/=375 feet over indoor surfaces with LRAD and supervision level of assist to promote household and community access. Baseline: 01/02/22: 850 feet with RW today  Goal status: MET  4.  Pt will decrease 5xSTS to </=13 seconds in order to demonstrate decreased risk for falls and improved functional bilateral LE strength and power. Baseline: 01/02/22: 13.69 sec's no UE support Goal status: MET  5.  Pt will ambulate>/=300 feet on 2MWT to demonstrate improved endurance for functional tasks in home and community. Baseline:  01/02/22: 280 feet, improved by 45 feet, just not to goal Goal status: MET  6.  Pt will demonstrate a gait speed of >/=2.20 feet/sec in order to decrease risk for falls. Baseline: 01/02/22: 2.74 ft/sec with RW Goal status: MET  LONG TERM GOALS: Target date: 01/23/2022  Pt will increase FOTO score to >/=75% in order to demonstrate subjective functional improvement. Baseline: 68% Goal status: INITIAL  2.  Pt will navigate 16 stairs using no rails supervision level reciprocally to improve safe functional mobility in home environment. Baseline: 4 step-to leaning against half wall/rail Goal status: INITIAL  3.  Pt will ambulate >/=750 feet with LRAD and ModI level of assist over various indoor and outdoor surfaces to promote household and community access. Baseline: 254' SBA RW Goal status: INITIAL  4.  Pt will demonstrate a gait speed of >/=2.4 feet/sec in order to decrease risk for falls. Baseline: 1.90 ft/sec Goal status:  INITIAL  ASSESSMENT:  CLINICAL IMPRESSION: Today's skilled session focused on progress toward remaining STG with goal partially met Remainder of session continued to focus on strengthening, gait with cane and balance training. No issues noted or reported in session. The pt is making steady progress and should benefit from continued PT to progress toward unmet goals.   OBJECTIVE IMPAIRMENTS Abnormal gait, decreased balance, decreased cognition, decreased endurance, and decreased strength.   ACTIVITY LIMITATIONS carrying, lifting, and locomotion level  PARTICIPATION LIMITATIONS: driving, community activity, occupation, and yard work  PERSONAL FACTORS Age, Fitness, Transportation, and 1 comorbidity: HTN are also affecting patient's functional outcome.   REHAB POTENTIAL: Excellent  CLINICAL DECISION MAKING: Stable/uncomplicated  EVALUATION COMPLEXITY: Low  PLAN: PT FREQUENCY: 2x/week  PT DURATION: 6 weeks  PLANNED INTERVENTIONS: Therapeutic exercises, Therapeutic activity, Neuromuscular re-education, Balance training, Gait training, Patient/Family education, Self Care, Stair training, Vestibular training, DME instructions, Electrical stimulation, Biofeedback, and Re-evaluation  PLAN FOR NEXT SESSION:  Modify HEP prn for right sided quad and glut strength and high level balance.  Gait training, stair training, pt may benefit from [place] in  the future.  Continue to work on right LE strengthening with focus on increased knee control.  Continue adv/retreat w/ incline blocks/compliant wedge, have pt practice ambulating up and down incline surfaces like ramp.  Heel taps.  SLS tasks.   Leg press.  Hip drives, hip strengthening.   Willow Ora, PTA, [place] [address] [place], [place] [address] [phone] 01/07/22, 10:41 AM

## 2022-01-07 NOTE — Therapy (Signed)
OUTPATIENT SPEECH LANGUAGE PATHOLOGY TREATMENT NOTE   Patient Name: Denise Herrera MRN: [id number] DOB:20-Jul-1970, 51 y.o., female Today's Date: 01/07/2022  PCP: Antony Contras MD REFERRING PROVIDER: Cathlyn Parsons, PA-C   END OF SESSION:   End of Session - 01/07/22 0932     Visit Number 6    Number of Visits 17    Date for SLP Re-Evaluation 02/06/22    Authorization Type BCBS    SLP Start Time 0932    SLP Stop Time  1015    SLP Time Calculation (min) 43 min    Activity Tolerance Patient tolerated treatment well                 Past Medical History:  Diagnosis Date   Chronic insomnia    History of migraine headaches    Narcolepsy without cataplexy(347.00)    Past Surgical History:  Procedure Laterality Date   CESAREAN SECTION     gyn cervical procedure     Patient Active Problem List   Diagnosis Date Noted   ICH (intracerebral hemorrhage) ([place]) 11/03/2021   Essential hypertension 09/05/2015   INSOMNIA, CHRONIC 03/01/2007   MIGRAINE HEADACHE 03/01/2007   Narcolepsy without cataplexy 03/01/2007    ONSET DATE: 11/03/2021    REFERRING DIAG: I61.0 (ICD-10-CM) - Nontraumatic subcortical hemorrhage of left cerebral hemisphere   THERAPY DIAG:  Aphasia  Cognitive communication deficit  Rationale for Evaluation and Treatment Rehabilitation  SUBJECTIVE: "doing good"   PAIN:  Are you having pain? No  OBJECTIVE:  TODAY'S TREATMENT:  01-07-22: Pt reports improved performance in iADLs and preferred activities, though has not yet returned to work. Overall, reports successful completion of desired tasks. SLP gently probes, pt denies any overt mistakes impacting performance. SLP provides education on activities to improve cogntive functioning and verbal expression. Reports ongoing anomia when participating in more complex conversations, though is compensating using synonym or circumlocution. No overt anomia/dysnomia demonstrated throughout conversation during  today's session. Pt is reporting difficulties processing visually presented information when reading. Will plan to target next session.   12-26-21: Denied any overt anomia/dysnomia in multiple conversations yesterday on birthday. Recalled recent MD appointment with good accuracy per chart review. Effective time management indicated this morning with no reduced recall of necessary items reported. Targeted verbal expression of functional household tasks and work related tasks, with pt able to provide thorough descriptions with rare min prompting. No overt anomia/dysnomia exhibited with pt able to use technical language as needed. Pt endorsed speech is still "not as precise" and has difficulty with higher level vocabulary. Will complete HEP for next session. Successful independent return to bill pay and medication management reported with use of automatic reminders. No overt errors endorsed.   12-24-21: Pt has returned to medication management, with pt able to recall types of medications taken and initial letter of names with good accuracy. Pt denied any difficulty with recalling to take medications. Received phone call from MD during session re: management of BP, in which pt exhibited good recall without need for compensations. Pt denied need to write down information to aid delayed recall. Pt returned to grocery shopping without use of list, with no difficulty reported. Targeted higher level word finding (divergent categories), with usual min to mod A required to expand mental flexibility (perseverated on own preferences) after initial 5-6 items named. Updated HEP to include high level word finding.   PATIENT EDUCATION: Education details: see above Person educated: Patient Education method: Customer service manager Education comprehension: verbalized understanding,  returned demonstration, and needs further education     GOALS: Goals reviewed with patient? Yes   SHORT TERM GOALS: Target date:  01/08/2022     Pt will implement word retrieval strategies during functional naming tasks and/or structured conversation given occasional min A over 2 sessions Baseline: 12-24-21, 12-26-21 Goal status: Met   2.  Pt will demonstrate increased emergent  and anticipatory awareness of errors during structured tasks related to home/work given occasional min A over 2 sessions  Baseline: 12-26-21 Goal status: ongoing   3.  Pt will ID functional deficits and verbalize appropriate solutions to optimize performance given occasional min A over 2 sessions Baseline: 12-24-21 Goal status: ongoing   4.  Pt will verbalize detailed synopsis of work related tasks given occasional min A over 2 sessions  Baseline: 12-26-21 Goal status: ongoing     LONG TERM GOALS: Target date: 02/06/2022     Pt will implement word retrieval strategies during 25+ minute unstructured conversation with ability to provide detailed information on related topic given rare min A over 2 sessions Baseline: 12-26-21 Goal status: ongoing   2.  Pt will verbalize and implement recommended cognitive compensations to optimize performance and task completion at home/work with rare min A over 2 sessions  Baseline: 12-26-21 Goal status: ongoing   3.  Pt will demonstrate increased awareness of functional deficits with pt able to verbalize deficits, identify appropriate strategy, and implement strategy given rare min A over 2 sessions  Baseline:  Goal status: ongoing   4.  Pt will report improved communication effectiveness via PROM by 2 points at last ST session Baseline: CPIB=19 Goal status: ongoing       ASSESSMENT:   CLINICAL IMPRESSION: Patient is a 51 y.o. female who was seen today for CVA in June 2023. [place] significant for ADHD. Conducted ongoing education and training of word retrieval strategies to maximize verbal expression as well as targeted recall for functional household tasks and compensations to optimize patient awareness and  return to prior functioning. Skilled ST is recommended to address expressive language and cognitive deficits to optimize return to functional activities and work.    OBJECTIVE IMPAIRMENTS include memory, awareness, executive functioning, and aphasia. These impairments are limiting patient from return to work, household responsibilities, and effectively communicating at home and in community. Factors affecting potential to achieve goals and functional outcome are cooperation/participation level and previous level of function. Patient will benefit from skilled SLP services to address above impairments and improve overall function.   REHAB POTENTIAL: Good   PLAN: SLP FREQUENCY: 2x/week   SLP DURATION: 8 weeks   PLANNED INTERVENTIONS: Language facilitation, Cueing hierachy, Cognitive reorganization, Internal/external aids, Functional tasks, Multimodal communication approach, SLP instruction and feedback, Compensatory strategies, and Patient/family education   Su Monks, CCC-SLP 01/07/2022, 9:53 AM

## 2022-01-09 ENCOUNTER — Ambulatory Visit: Payer: BLUE CROSS/BLUE SHIELD | Admitting: Occupational Therapy

## 2022-01-09 ENCOUNTER — Ambulatory Visit: Payer: BLUE CROSS/BLUE SHIELD | Admitting: Physical Therapy

## 2022-01-09 ENCOUNTER — Encounter: Payer: Self-pay | Admitting: Occupational Therapy

## 2022-01-09 ENCOUNTER — Ambulatory Visit: Payer: BLUE CROSS/BLUE SHIELD | Admitting: Speech Pathology

## 2022-01-09 ENCOUNTER — Encounter: Payer: Self-pay | Admitting: Physical Therapy

## 2022-01-09 VITALS — BP 118/80 | HR 67

## 2022-01-09 DIAGNOSIS — R4701 Aphasia: Secondary | ICD-10-CM | POA: Diagnosis not present

## 2022-01-09 DIAGNOSIS — M6281 Muscle weakness (generalized): Secondary | ICD-10-CM

## 2022-01-09 DIAGNOSIS — G47419 Narcolepsy without cataplexy: Secondary | ICD-10-CM | POA: Diagnosis not present

## 2022-01-09 DIAGNOSIS — R41841 Cognitive communication deficit: Secondary | ICD-10-CM

## 2022-01-09 DIAGNOSIS — I69151 Hemiplegia and hemiparesis following nontraumatic intracerebral hemorrhage affecting right dominant side: Secondary | ICD-10-CM

## 2022-01-09 DIAGNOSIS — I69153 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: Secondary | ICD-10-CM

## 2022-01-09 DIAGNOSIS — R2681 Unsteadiness on feet: Secondary | ICD-10-CM | POA: Diagnosis not present

## 2022-01-09 DIAGNOSIS — I10 Essential (primary) hypertension: Secondary | ICD-10-CM | POA: Diagnosis not present

## 2022-01-09 DIAGNOSIS — R208 Other disturbances of skin sensation: Secondary | ICD-10-CM

## 2022-01-09 DIAGNOSIS — R278 Other lack of coordination: Secondary | ICD-10-CM

## 2022-01-09 DIAGNOSIS — R2689 Other abnormalities of gait and mobility: Secondary | ICD-10-CM

## 2022-01-09 DIAGNOSIS — K219 Gastro-esophageal reflux disease without esophagitis: Secondary | ICD-10-CM | POA: Diagnosis not present

## 2022-01-09 DIAGNOSIS — R41842 Visuospatial deficit: Secondary | ICD-10-CM

## 2022-01-09 DIAGNOSIS — I69351 Hemiplegia and hemiparesis following cerebral infarction affecting right dominant side: Secondary | ICD-10-CM | POA: Diagnosis not present

## 2022-01-09 NOTE — Patient Instructions (Signed)
Completing required tasks (such as bill pay):  Use your written reminders To do list Emails, text messages Reminders on phone  Post it notes Don't delay When you get the reminder, take care of the task as soon as you can For things that are reoccurring, consider making a master list to double check you've completed For example: monthly bill list that you check off as you go to make sure everything has been paid before the end of the month

## 2022-01-09 NOTE — Therapy (Signed)
OUTPATIENT OCCUPATIONAL THERAPY NEURO Treatment  Patient Name: Denise Herrera MRN: [id number] DOB:March 19, 1971, 51 y.o., female Today's Date: 01/09/2022  PCP: Dr. Antony Contras REFERRING PROVIDER: Cathlyn Parsons, PA-C    OT End of Session - 01/09/22 [id number]     Visit Number 4    Number of Visits 25    Date for OT Re-Evaluation 03/11/22    Authorization Type BCBS, covered 100% (OOP met), visit limit--[place]    OT Start Time 0848    OT Stop Time 0930    OT Time Calculation (min) 42 min    Activity Tolerance Patient tolerated treatment well    Behavior During Therapy WFL for tasks assessed/performed               Past Medical History:  Diagnosis Date   Chronic insomnia    History of migraine headaches    Narcolepsy without cataplexy(347.00)    Past Surgical History:  Procedure Laterality Date   CESAREAN SECTION     gyn cervical procedure     Patient Active Problem List   Diagnosis Date Noted   ICH (intracerebral hemorrhage) ([place]) 11/03/2021   Essential hypertension 09/05/2015   INSOMNIA, CHRONIC 03/01/2007   MIGRAINE HEADACHE 03/01/2007   Narcolepsy without cataplexy 03/01/2007    ONSET DATE: 11/05/21  REFERRING DIAG: I61.0 (ICD-10-CM) - Nontraumatic subcortical hemorrhage of left cerebral hemisphere ([place])   THERAPY DIAG:  Muscle weakness (generalized)  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 Blood pressure mildly elevated 141/85. CT of the head showed acute parenchymal hemorrhage in the left corona radiata with intraventricular extension.  Hospital d/c 11/28/21.  PMH:  history of ADHD maintained on Ritalin, migraine headaches, hypertension, hyperlipidemia, hx of narcolepsy without cataplex, Hx of R RTC repair 03/2021.     PRECAUTIONS: Fall  WEIGHT BEARING RESTRICTIONS No  PAIN:  Are you having pain? No  FALLS: Has patient fallen in last 6 months? 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 Therapist recommends pt has assist at home UBE x 6 min, level 3 for normal reciprocal movement pattern and UB conditioning w/ cues for positioning Copying medium peg design with RUE, mirror used and min v.c for shoulder positioning to avoid hiking, mod difficulty due to sensory impairment.     PATIENT EDUCATION: Education details: shoulder HEP review Person educated: Patient Education method: Explanation, Demonstration,  Verbal cues,  Education comprehension: verbalized understanding, returned demonstration, and verbal cues required    HOME EXERCISE PROGRAM: 8/18/23coordination HEP 01/07/22: Shoulder HEP    GOALS: Potential Goals reviewed with patient? Yes  SHORT TERM GOALS:  Target date: 01/11/22  Pt will be independent with initial HEP. Goal status: IN PROGRESS  2.  Pt will improve dominant RUE coordination/functional reaching for ADLs as shown by improving score on box and blocks test by at least 8. Baseline: 34 blocks Goal status: IN PROGRESS  3.  Pt will use RUE as dominant UE for eating and grooming 100% of the time. Goal status: IN PROGRESS  4.  Pt will perform simple home maintenance tasks with supervision. Goal status: INITIAL  5.  Pt will perform environmental scanning/navigation with at least 95% accuracy in busy environment. Goal status: INITIAL  6.  Pt will be able to write at least 2 sentences with good legibility in reasonable amount of time. Goal status: IN PROGRESS  LONG TERM GOALS: Target date: 03/11/22  Pt will be independent with updated HEP Goal status: INITIAL  2.  Pt will improve R hand coordination for ADLs as shown by completing 9-hole peg test in less than 90sec. Baseline: only able to put in 1 peg within 27mn Goal status: INITIAL  3.  Pt will use RUE as dominant UE for eating and grooming at least 75% of the time. Goal status: INITIAL  4.  Pt will perform simple-mod complex home maintenance tasks mod I. Goal status: INITIAL  5.  Pt will improve RUE strength to be be able to retrieve 3lb object from overhead shelf safely x3. Goal status: INITIAL  6.  Pt will improve R grip strength to at least 30lbs to assist in lifting objects/opening containers. Goal status: INITIAL  7.  Pt will perform environmental scanning/navigation in busy environment with at least 95% accuracy for incr safety.   Goal status: INITIAL  ASSESSMENT:  CLINICAL IMPRESSION: Pt progressing towards  goals with improving strength and coordination.  PERFORMANCE DEFICITS in functional skills including ADLs, IADLs, coordination, dexterity, sensation, ROM, strength, FMC, GMC, mobility, balance, decreased knowledge of precautions, decreased knowledge of  use of DME, vision, and UE functional use, cognitive skills including memory, and psychosocial skills including habits.   IMPAIRMENTS are limiting patient from ADLs, IADLs, work, leisure, and social participation.   COMORBIDITIES may have co-morbidities  that affects occupational performance. Patient will benefit from skilled OT to address above impairments and improve overall function.  MODIFICATION OR ASSISTANCE TO COMPLETE EVALUATION: Min-Moderate modification of tasks or assist with assess necessary to complete an evaluation.  OT OCCUPATIONAL PROFILE AND HISTORY: Detailed assessment: Review of records and additional review of physical, cognitive, psychosocial history related to current functional performance.  CLINICAL DECISION MAKING: Moderate - several treatment options, min-mod task modification necessary  REHAB POTENTIAL: Good  EVALUATION COMPLEXITY: Moderate    PLAN: OT FREQUENCY: 2x/week  OT DURATION: 12 weeks +eval  PLANNED INTERVENTIONS: self care/ADL training, therapeutic exercise, therapeutic activity, neuromuscular re-education, manual therapy, passive range of motion, balance training, functional mobility training, aquatic therapy, splinting, electrical stimulation, ultrasound, paraffin, fluidotherapy, moist heat, cryotherapy, patient/family education, cognitive remediation/compensation, visual/perceptual remediation/compensation, energy conservation, and DME and/or AE instructions  RECOMMENDED OTHER SERVICES: none at this time  CONSULTED AND AGREED WITH PLAN OF CARE: Patient  PLAN FOR NEXT SESSION:  continue neuro re-ed RUE, fx'al reaching and coordination RUE   RINE,KATHRYN, OTR/L 01/09/2022, 8:52 AM

## 2022-01-09 NOTE — Patient Instructions (Signed)
Access Code: [facility] URL: [email or web address] Date: 01/09/2022 Prepared by: Camille Bal  Exercises - Standing Terminal Knee Extension with Resistance  - 1 x daily - 7 x weekly - 3 sets - 10 reps - Forward Step Up with Counter Support  - 1 x daily - 7 x weekly - 3 sets - 10 reps - Standing Single Leg Stance with Counter Support  - 1 x daily - 7 x weekly - 3 sets - 10 reps - Squat  - 1 x daily - 7 x weekly - 3 sets - 10 reps - Side Stepping with Resistance at Thighs  - 1 x daily - 7 x weekly - 3 sets - 10 reps (green theraband) - Forward and Backward Monster Walk with Counter Support  - 1 x daily - 7 x weekly - 3 sets - 10 reps (green theraband)

## 2022-01-09 NOTE — Therapy (Signed)
OUTPATIENT SPEECH LANGUAGE PATHOLOGY TREATMENT NOTE   Patient Name: Denise Herrera MRN: [id number] DOB:28-Sep-1970, 51 y.o., female Today's Date: 01/09/2022  PCP: Antony Contras MD REFERRING PROVIDER: Cathlyn Parsons, PA-C   END OF SESSION:   End of Session - 01/09/22 0920     Visit Number 7    Number of Visits 17    Date for SLP Re-Evaluation 02/06/22    Authorization Type BCBS    SLP Start Time 0930    SLP Stop Time  1015    SLP Time Calculation (min) 45 min    Activity Tolerance Patient tolerated treatment well                  Past Medical History:  Diagnosis Date   Chronic insomnia    History of migraine headaches    Narcolepsy without cataplexy(347.00)    Past Surgical History:  Procedure Laterality Date   CESAREAN SECTION     gyn cervical procedure     Patient Active Problem List   Diagnosis Date Noted   ICH (intracerebral hemorrhage) ([place]) 11/03/2021   Essential hypertension 09/05/2015   INSOMNIA, CHRONIC 03/01/2007   MIGRAINE HEADACHE 03/01/2007   Narcolepsy without cataplexy 03/01/2007    ONSET DATE: 11/03/2021    REFERRING DIAG: I61.0 (ICD-10-CM) - Nontraumatic subcortical hemorrhage of left cerebral hemisphere   THERAPY DIAG:  Aphasia  Cognitive communication deficit  Rationale for Evaluation and Treatment Rehabilitation  SUBJECTIVE: Pt reports overall things are going well at home, continues to report participation in daily activities   PAIN:  Are you having pain? No  OBJECTIVE:  TODAY'S TREATMENT:  01-09-22: Pt report word finding going well, rare cueing from husband required. Overall feels able to meet communication needs with variety of communication partners. SLP facilitated discussion regarding pt's current strategy for financial management. Pt able to ID x6 reoccurring bills IND, additional x5 with questioning cues from SLP. Pt using reminders to aid in recall of need to pay bills. SLP provides pt with additional strategies pt  can use to aid in successful execution: don't delay, keep reminders/emails unread, making master checklist to be used/referenced monthly. Discussion on benefit from creating routine, accountability partners, and external aid to optimize participation in desired activities such as completion of HEP, daily readings, and household activities. Pt verbalizes understanding via teach back with occasional min-A.   01-07-22: Pt reports improved performance in iADLs and preferred activities, though has not yet returned to work. Overall, reports successful completion of desired tasks. SLP gently probes, pt denies any overt mistakes impacting performance. SLP provides education on activities to improve cogntive functioning and verbal expression. Reports ongoing anomia when participating in more complex conversations, though is compensating using synonym or circumlocution. No overt anomia/dysnomia demonstrated throughout conversation during today's session. Pt is reporting difficulties processing visually presented information when reading. Will plan to target next session.   PATIENT EDUCATION: Education details: see above Person educated: Patient Education method: Customer service manager Education comprehension: verbalized understanding, returned demonstration, and needs further education     GOALS: Goals reviewed with patient? Yes   SHORT TERM GOALS: Target date: 01/08/2022     Pt will implement word retrieval strategies during functional naming tasks and/or structured conversation given occasional min A over 2 sessions Baseline: 12-24-21, 12-26-21 Goal status: Met   2.  Pt will demonstrate increased emergent  and anticipatory awareness of errors during structured tasks related to home/work given occasional min A over 2 sessions  Baseline: 12-26-21 Goal status:  ongoing   3.  Pt will ID functional deficits and verbalize appropriate solutions to optimize performance given occasional min A over 2  sessions Baseline: 12-24-21 Goal status: ongoing   4.  Pt will verbalize detailed synopsis of work related tasks given occasional min A over 2 sessions  Baseline: 12-26-21 Goal status: ongoing     LONG TERM GOALS: Target date: 02/06/2022     Pt will implement word retrieval strategies during 25+ minute unstructured conversation with ability to provide detailed information on related topic given rare min A over 2 sessions Baseline: 12-26-21; 01-09-22 Goal status: met   2.  Pt will verbalize and implement recommended cognitive compensations to optimize performance and task completion at home/work with rare min A over 2 sessions  Baseline: 12-26-21 Goal status: ongoing   3.  Pt will demonstrate increased awareness of functional deficits with pt able to verbalize deficits, identify appropriate strategy, and implement strategy given rare min A over 2 sessions  Baseline:  Goal status: ongoing   4.  Pt will report improved communication effectiveness via PROM by 2 points at last ST session Baseline: CPIB=19 Goal status: ongoing       ASSESSMENT:   CLINICAL IMPRESSION: Patient is a 51 y.o. female who was seen today for CVA in June 2023. [place] significant for ADHD. Conducted ongoing education and training of word retrieval strategies to maximize verbal expression as well as targeted recall for functional household tasks and compensations to optimize patient awareness and return to prior functioning. Skilled ST is recommended to address expressive language and cognitive deficits to optimize return to functional activities and work.    OBJECTIVE IMPAIRMENTS include memory, awareness, executive functioning, and aphasia. These impairments are limiting patient from return to work, household responsibilities, and effectively communicating at home and in community. Factors affecting potential to achieve goals and functional outcome are cooperation/participation level and previous level of function.  Patient will benefit from skilled SLP services to address above impairments and improve overall function.   REHAB POTENTIAL: Good   PLAN: SLP FREQUENCY: 2x/week   SLP DURATION: 8 weeks   PLANNED INTERVENTIONS: Language facilitation, Cueing hierachy, Cognitive reorganization, Internal/external aids, Functional tasks, Multimodal communication approach, SLP instruction and feedback, Compensatory strategies, and Patient/family education   Su Monks, CCC-SLP 01/09/2022, 9:21 AM

## 2022-01-09 NOTE — Therapy (Signed)
OUTPATIENT PHYSICAL THERAPY TREATMENT NOTE   Patient Name: Denise Herrera MRN: [id number] DOB:[birth year], 51 y.o., female Today's Date: 01/09/2022  PCP: Antony Contras, MD REFERRING PROVIDER: Cathlyn Parsons, PA-C  END OF SESSION:   PT End of Session - 01/09/22 1020     Visit Number 9    Number of Visits 13   12+eval   Date for PT Re-Evaluation 02/06/22   pushed out due to scheduling delay   Authorization Type [employer]    PT Start Time 1018    PT Stop Time 1103    PT Time Calculation (min) 45 min    Equipment Utilized During Treatment Gait belt    Activity Tolerance Patient tolerated treatment well    Behavior During Therapy WFL for tasks assessed/performed             Past Medical History:  Diagnosis Date   Chronic insomnia    History of migraine headaches    Narcolepsy without cataplexy(347.00)    Past Surgical History:  Procedure Laterality Date   CESAREAN SECTION     gyn cervical procedure     Patient Active Problem List   Diagnosis Date Noted   ICH (intracerebral hemorrhage) ([place]) 11/03/2021   Essential hypertension 09/05/2015   INSOMNIA, CHRONIC 03/01/2007   MIGRAINE HEADACHE 03/01/2007   Narcolepsy without cataplexy 03/01/2007    REFERRING DIAG: I61.0 (ICD-10-CM) - Nontraumatic subcortical hemorrhage of left cerebral hemisphere ([employer])   THERAPY DIAG:  Unsteadiness on feet  Other abnormalities of gait and mobility  Muscle weakness (generalized)  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  Left CVA/ICH confirmed. Admitted to rehab on 11/08/2021 and discharged 11/29/2021.  PRECAUTIONS: Fall  SUBJECTIVE: No new complaints. No falls or pain to report.    PAIN:  Are you having  pain? No  VITALS: Before session (LUE):  Today's Vitals   01/09/22 1024  BP: 118/80  Pulse: 67    TODAY'S TREATMENT: -3-way hip x15 bil w/ green theraband -lateral stepping 6x8' w/ green theraband at thighs-added to HEP -fwd/bckwrds monster walk w/ green theraband at thighs 6x8' each direction-added to HEP  RAMP:  Level of Assistance: SBA Assistive device utilized: Quad cane small base Ramp Comments: x2; Cued for quad control on descent of initial trial with pt demonstrating improved control, no buckling or excessive hyperextension noted.  CURB:  Level of Assistance: SBA Assistive device utilized: Environmental manager Comments: x2, pt demos fwd step down and retro step down w/ good safety and stability.  Cued for initial up with the good, down with the bad.  Pt demos up with RLE for PT to assess functional strength without LOB.  Pt demonstrates good sequencing of cane.  STAIRS:  Level of Assistance: SBA  Stair Negotiation Technique: Step to Pattern Sideways with No Rails  Number of Stairs: 4   Height of Stairs: 6"  Comments: Pt turns to face right ascending w/ LLE at lateral angle using rail as wall as she would at home.  Discussed cane use on stairs as pt progresses and pt would benefit from practice with cane on stairs next session.  GAIT: Gait pattern: step through pattern, decreased arm swing- Right, genu recurvatum- Right, and genu recurvatum- Left Distance walked: 230' Assistive device utilized: Quad cane small base Level of  assistance: Modified independence and SBA Comments: Pt demonstrates improved quad control of right knee during inc distance of ambulation this session w/o AFO.  Discussed safety with use of cane and purchase of rubber tip quad cane at home.  Pt advised to use supervision with initial trials and switch AD to RW as needed for uneven terrain or inc distance.  She may benefit from Bioness use in the future to further address quad control w/ use of cane  on stairs and level ground especially.  Pt advised to wear AFO for long distances or uneven terrain due to intermittent quad fatigue.     PATIENT EDUCATION: Education details:  Continue HEP.  Discussed additions and progressions for exercises added today.  Cleared pt to purchase rubber tip NBQC for use at home with discussion of using best judgement for navigating unlevel terrain and distance using RW as needed and to have supervision during initial trials with cane for level, short distances.  Discussed working with the cane on the stairs next session as well as outdoors. Person educated: Patient Education method: Explanation Education comprehension: verbalized understanding   HOME EXERCISE PROGRAM: Access Code: [facility] URL: [email or web address] Date: 12/30/2021 Prepared by: Elease Etienne  Exercises - Standing Terminal Knee Extension with Resistance  - 1 x daily - 7 x weekly - 3 sets - 10 reps - Forward Step Up with Counter Support  - 1 x daily - 7 x weekly - 3 sets - 10 reps - Standing Single Leg Stance with Counter Support  - 1 x daily - 7 x weekly - 3 sets - 10 reps - Squat  - 1 x daily - 7 x weekly - 3 sets - 10 reps - Side Stepping with Resistance at Thighs  - 1 x daily - 7 x weekly - 3 sets - 10 reps (green theraband) - Forward and Backward Monster Walk with Counter Support  - 1 x daily - 7 x weekly - 3 sets - 10 reps (green theraband)  GOALS: Goals reviewed with patient? Yes  SHORT TERM GOALS: Target date: 01/02/2022  Pt will be independent with strength and balance HEP with supervision from family as needed. Baseline:  01/02/22: met with current HEP Goal status: MET  2.  Pt will navigate 4 stairs w/o handrails reciprocally at supervision level to improve safety with functional mobility in her home. Baseline:  01/07/22: able to use reciprocal pattern, however continues to need rails.  Goal status: PARTIALLY MET  3.  Pt will ambulate >/=375 feet over  indoor surfaces with LRAD and supervision level of assist to promote household and community access. Baseline: 01/02/22: 850 feet with RW today  Goal status: MET  4.  Pt will decrease 5xSTS to </=13 seconds in order to demonstrate decreased risk for falls and improved functional bilateral LE strength and power. Baseline: 01/02/22: 13.69 sec's no UE support Goal status: MET  5.  Pt will ambulate>/=300 feet on 2MWT to demonstrate improved endurance for functional tasks in home and community. Baseline:  01/02/22: 280 feet, improved by 45 feet, just not to goal Goal status: MET  6.  Pt will demonstrate a gait speed of >/=2.20 feet/sec in order to decrease risk for falls. Baseline: 01/02/22: 2.74 ft/sec with RW Goal status: MET  LONG TERM GOALS: Target date: 01/23/2022  Pt will increase FOTO score to >/=75% in order to demonstrate subjective functional improvement. Baseline: 68% Goal status: INITIAL  2.  Pt will navigate 16 stairs using no rails supervision level reciprocally  to improve safe functional mobility in home environment. Baseline: 4 step-to leaning against half wall/rail Goal status: INITIAL  3.  Pt will ambulate >/=750 feet with LRAD and ModI level of assist over various indoor and outdoor surfaces to promote household and community access. Baseline: 254' SBA RW Goal status: INITIAL  4.  Pt will demonstrate a gait speed of >/=2.4 feet/sec in order to decrease risk for falls. Baseline: 1.90 ft/sec Goal status: INITIAL  ASSESSMENT:  CLINICAL IMPRESSION: Progressed to NBQC this session with pt ready for home use of LRAD.  Continued addressing safety and quad control with gait training.  Made additions to HEP to further progress bilateral hip strength.  Pt may benefit from Bioness in the future to continue progress of current methods in relation to quad control for functional mobility tasks.  Will continue to progress pt towards LTGs per POC.  OBJECTIVE IMPAIRMENTS Abnormal gait,  decreased balance, decreased cognition, decreased endurance, and decreased strength.   ACTIVITY LIMITATIONS carrying, lifting, and locomotion level  PARTICIPATION LIMITATIONS: driving, community activity, occupation, and yard work  PERSONAL FACTORS Age, Fitness, Transportation, and 1 comorbidity: HTN are also affecting patient's functional outcome.   REHAB POTENTIAL: Excellent  CLINICAL DECISION MAKING: Stable/uncomplicated  EVALUATION COMPLEXITY: Low  PLAN: PT FREQUENCY: 2x/week  PT DURATION: 6 weeks  PLANNED INTERVENTIONS: Therapeutic exercises, Therapeutic activity, Neuromuscular re-education, Balance training, Gait training, Patient/Family education, Self Care, Stair training, Vestibular training, DME instructions, Electrical stimulation, Biofeedback, and Re-evaluation  PLAN FOR NEXT SESSION:  Modify HEP prn for right sided quad and glut strength and high level balance.  Gait training-use NBQC rubber tip on stairs and outdoors over uneven surfaces, stair training, pt may benefit from Bioness in the future- w/ cane.  Continue to work on right LE strengthening with focus on increased knee control.  Continue adv/retreat w/ incline blocks/compliant wedge, have pt practice ambulating up and down incline surfaces like ramp.  Heel taps.  SLS tasks.   Leg press.  Hip drives, hip strengthening.   Elease Etienne, PT, DPT Outpatient Neuro [facility] [address] [place], [place] [address] [phone] 01/09/22, 4:50 PM

## 2022-01-14 ENCOUNTER — Ambulatory Visit: Payer: BLUE CROSS/BLUE SHIELD | Admitting: Speech Pathology

## 2022-01-14 ENCOUNTER — Ambulatory Visit: Payer: BLUE CROSS/BLUE SHIELD

## 2022-01-14 ENCOUNTER — Ambulatory Visit: Payer: BLUE CROSS/BLUE SHIELD | Admitting: Occupational Therapy

## 2022-01-14 VITALS — BP 128/87 | HR 64

## 2022-01-14 DIAGNOSIS — R2689 Other abnormalities of gait and mobility: Secondary | ICD-10-CM

## 2022-01-14 DIAGNOSIS — M6281 Muscle weakness (generalized): Secondary | ICD-10-CM | POA: Diagnosis not present

## 2022-01-14 DIAGNOSIS — R208 Other disturbances of skin sensation: Secondary | ICD-10-CM

## 2022-01-14 DIAGNOSIS — I69151 Hemiplegia and hemiparesis following nontraumatic intracerebral hemorrhage affecting right dominant side: Secondary | ICD-10-CM

## 2022-01-14 DIAGNOSIS — R4701 Aphasia: Secondary | ICD-10-CM | POA: Diagnosis not present

## 2022-01-14 DIAGNOSIS — R2681 Unsteadiness on feet: Secondary | ICD-10-CM

## 2022-01-14 DIAGNOSIS — R41841 Cognitive communication deficit: Secondary | ICD-10-CM

## 2022-01-14 DIAGNOSIS — R41842 Visuospatial deficit: Secondary | ICD-10-CM

## 2022-01-14 DIAGNOSIS — I69153 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: Secondary | ICD-10-CM | POA: Diagnosis not present

## 2022-01-14 DIAGNOSIS — R278 Other lack of coordination: Secondary | ICD-10-CM

## 2022-01-14 NOTE — Therapy (Signed)
OUTPATIENT SPEECH LANGUAGE PATHOLOGY TREATMENT NOTE   Patient Name: Denise Herrera MRN: [id number] DOB:[birth year], 51 y.o., female Today's Date: 01/14/2022  PCP: Antony Contras MD REFERRING PROVIDER: Cathlyn Parsons, PA-C   END OF SESSION:   End of Session - 01/14/22 0847     Visit Number 8    Number of Visits 17    Date for SLP Re-Evaluation 02/06/22    Authorization Type BCBS    SLP Start Time 0847    SLP Stop Time  0930    SLP Time Calculation (min) 43 min    Activity Tolerance Patient tolerated treatment well                   Past Medical History:  Diagnosis Date   Chronic insomnia    History of migraine headaches    Narcolepsy without cataplexy(347.00)    Past Surgical History:  Procedure Laterality Date   CESAREAN SECTION     gyn cervical procedure     Patient Active Problem List   Diagnosis Date Noted   ICH (intracerebral hemorrhage) ([place]) 11/03/2021   Essential hypertension 09/05/2015   INSOMNIA, CHRONIC 03/01/2007   MIGRAINE HEADACHE 03/01/2007   Narcolepsy without cataplexy 03/01/2007    ONSET DATE: 11/03/2021    REFERRING DIAG: I61.0 (ICD-10-CM) - Nontraumatic subcortical hemorrhage of left cerebral hemisphere   THERAPY DIAG:  Cognitive communication deficit  Aphasia  Rationale for Evaluation and Treatment Rehabilitation  SUBJECTIVE: "I'm good"   PAIN:  Are you having pain? No  OBJECTIVE:  TODAY'S TREATMENT:  01-14-22: Denise Herrera tells SLP she is doing well at home, no overt difficulties with participation in desired activities. PT able to relay functional problem solving solution in which she determine appropriate action to rectify bus issues for her grandson. SLP provided education on strategies to enhance attention during reading tasks to include limiting internal and external distractions, reading during optimal time of day, and highlighting key words. SLP provided systematic instruction for use of spaced learning to aid in retention  and recall of read information. Led pt through simulation of strategy with pt-selected article. Following x2 readings with 7 minute latency period, pt able to relay x3 pertinent details from article with some vague language exhibited. SLP inquires if this is due to lack of understanding or retention, pt says "no, it was just boring". Note there was mild improvement between 1st and 2nd retelling evidenced through cohesiveness of retell. Following both reading, pt able to identify x2 distractions which may have impacted attention during reading task. SLP advised that based on pt report of successful participation in all desired activities and reported improvement in overall cognitive functioning, w/o family members present to provide additional information, will plan to d/c at next session. Pt is in agreement, verbalizes appreciation for therapeutic interventions provided.   01-09-22: Pt report word finding going well, rare cueing from husband required. Overall feels able to meet communication needs with variety of communication partners. SLP facilitated discussion regarding pt's current strategy for financial management. Pt able to ID x6 reoccurring bills IND, additional x5 with questioning cues from SLP. Pt using reminders to aid in recall of need to pay bills. SLP provides pt with additional strategies pt can use to aid in successful execution: don't delay, keep reminders/emails unread, making master checklist to be used/referenced monthly. Discussion on benefit from creating routine, accountability partners, and external aid to optimize participation in desired activities such as completion of HEP, daily readings, and household activities. Pt verbalizes understanding  via teach back with occasional min-A.   01-07-22: Pt reports improved performance in iADLs and preferred activities, though has not yet returned to work. Overall, reports successful completion of desired tasks. SLP gently probes, pt denies any overt  mistakes impacting performance. SLP provides education on activities to improve cogntive functioning and verbal expression. Reports ongoing anomia when participating in more complex conversations, though is compensating using synonym or circumlocution. No overt anomia/dysnomia demonstrated throughout conversation during today's session. Pt is reporting difficulties processing visually presented information when reading. Will plan to target next session.   PATIENT EDUCATION: Education details: see above Person educated: Patient Education method: Customer service manager Education comprehension: verbalized understanding, returned demonstration, and needs further education     GOALS: Goals reviewed with patient? Yes   SHORT TERM GOALS: Target date: 01/08/2022     Pt will implement word retrieval strategies during functional naming tasks and/or structured conversation given occasional min A over 2 sessions Baseline: 12-24-21, 12-26-21 Goal status: Met   2.  Pt will demonstrate increased emergent  and anticipatory awareness of errors during structured tasks related to home/work given occasional min A over 2 sessions  Baseline: 12-26-21, 01-09-22 Goal status: met   3.  Pt will ID functional deficits and verbalize appropriate solutions to optimize performance given occasional min A over 2 sessions Baseline: 12-24-21, 01-09-22 Goal status: met   4.  Pt will verbalize detailed synopsis of work related tasks given occasional min A over 2 sessions  Baseline: 12-26-21 Goal status:  met     LONG TERM GOALS: Target date: 02/06/2022     Pt will implement word retrieval strategies during 25+ minute unstructured conversation with ability to provide detailed information on related topic given rare min A over 2 sessions Baseline: 12-26-21; 01-09-22 Goal status: met   2.  Pt will verbalize and implement recommended cognitive compensations to optimize performance and task completion at home/work with rare  min A over 2 sessions  Baseline: 12-26-21 Goal status: ongoing   3.  Pt will demonstrate increased awareness of functional deficits with pt able to verbalize deficits, identify appropriate strategy, and implement strategy given rare min A over 2 sessions  Baseline:  Goal status: ongoing   4.  Pt will report improved communication effectiveness via PROM by 2 points at last ST session Baseline: CPIB=19 Goal status: ongoing       ASSESSMENT:   CLINICAL IMPRESSION: Patient is a 51 y.o. female who was seen today for CVA in June 2023. [place] significant for ADHD. Conducted ongoing education and training of word retrieval strategies to maximize verbal expression as well as targeted recall for functional household tasks and compensations to optimize patient awareness and return to prior functioning. Skilled ST is recommended to address expressive language and cognitive deficits to optimize return to functional activities and work.    OBJECTIVE IMPAIRMENTS include memory, awareness, executive functioning, and aphasia. These impairments are limiting patient from return to work, household responsibilities, and effectively communicating at home and in community. Factors affecting potential to achieve goals and functional outcome are cooperation/participation level and previous level of function. Patient will benefit from skilled SLP services to address above impairments and improve overall function.   REHAB POTENTIAL: Good   PLAN: SLP FREQUENCY: 2x/week   SLP DURATION: 8 weeks   PLANNED INTERVENTIONS: Language facilitation, Cueing hierachy, Cognitive reorganization, Internal/external aids, Functional tasks, Multimodal communication approach, SLP instruction and feedback, Compensatory strategies, and Patient/family education   Su Monks, CCC-SLP 01/14/2022, 8:48 AM

## 2022-01-14 NOTE — Therapy (Signed)
OUTPATIENT OCCUPATIONAL THERAPY NEURO Treatment  Patient Name: Denise Herrera MRN: [id number] DOB:1970/10/30, 51 y.o., female Today's Date: 01/14/2022  PCP: Dr. Antony Contras REFERRING PROVIDER: Cathlyn Parsons, PA-C    OT End of Session - 01/14/22 1202     Visit Number 5    Number of Visits 25    Date for OT Re-Evaluation 03/11/22    Authorization Type [employer], covered 100% (OOP met), visit limit--MN    OT Start Time 0804    OT Stop Time 0845    OT Time Calculation (min) 41 min    Activity Tolerance Patient tolerated treatment well    Behavior During Therapy WFL for tasks assessed/performed                Past Medical History:  Diagnosis Date   Chronic insomnia    History of migraine headaches    Narcolepsy without cataplexy(347.00)    Past Surgical History:  Procedure Laterality Date   CESAREAN SECTION     gyn cervical procedure     Patient Active Problem List   Diagnosis Date Noted   ICH (intracerebral hemorrhage) ([place]) 11/03/2021   Essential hypertension 09/05/2015   INSOMNIA, CHRONIC 03/01/2007   MIGRAINE HEADACHE 03/01/2007   Narcolepsy without cataplexy 03/01/2007    ONSET DATE: 11/05/21  REFERRING DIAG: I61.0 (ICD-10-CM) - Nontraumatic subcortical hemorrhage of left cerebral hemisphere ([place])   THERAPY DIAG:  Muscle weakness (generalized)  Hemiplegia and hemiparesis following nontraumatic intracerebral hemorrhage affecting right dominant side (HCC)  Other lack of coordination  Other disturbances of skin sensation  Visuospatial deficit  Other abnormalities of gait and mobility  Unsteadiness on feet  Rationale for Evaluation and Treatment Rehabilitation  SUBJECTIVE:   SUBJECTIVE STATEMENT: Pt reports her shoulder feels stiff  Pt accompanied by: significant other present for first half of eval (then had to leave for work)  PERTINENT HISTORY: 11/05/2021 with acute onset of right-sided weakness and aphasia. Blood pressure mildly elevated  141/85. CT of the head showed acute parenchymal hemorrhage in the left corona radiata with intraventricular extension.  Hospital d/c 11/28/21.  PMH:  history of ADHD maintained on Ritalin, migraine headaches, hypertension, hyperlipidemia, hx of narcolepsy without cataplex, Hx of R RTC repair 03/2021.     PRECAUTIONS: Fall  WEIGHT BEARING RESTRICTIONS No  PAIN:  Are you having pain? No  FALLS: Has patient fallen in last 6 months? 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, accounting, physically, lifting up to 50lbs), and Leisure: sleep , shopping/thrift shopping, dance, skate   PATIENT GOALS   get back to doing things around the house, returning to work   OBJECTIVE:     TODAY'S TREATMENT:  Systems developer and cow position, then rocking forwards and backwards, min facilitation/ v.c Reviewed proximal shoulder HEP ( with 3 lbs weight utilized as pt reports she has 3 lbs at home,)10 reps each, min v.c for positioning Seated cane exercise for right shoulder flexion, mirror and min-mod v.c for positioning,  attempted shoulder extension in seated however pt was unable to maintain good positioning Handwriting activities, using coban wrap on pen, pt demonstrates improved handwriting and legibility today She wrote several sentences with 95-100%% accuracy, using coban wrap    PATIENT EDUCATION: Education details: shoulder HEP review Person educated: Patient Education method: Explanation, Demonstration, Verbal cues,  Education comprehension: verbalized understanding, returned demonstration, and verbal cues required    HOME  EXERCISE PROGRAM: 8/18/23coordination HEP 01/07/22: Shoulder HEP    GOALS: Potential Goals reviewed with patient? Yes  SHORT TERM GOALS: Target date: 01/23/22- extended due to schedule  Pt will be independent with initial  HEP. Goal status: IN PROGRESS  2.  Pt will improve dominant RUE coordination/functional reaching for ADLs as shown by improving score on box and blocks test by at least 8. Baseline: 34 blocks Goal status: IN PROGRESS  3.  Pt will use RUE as dominant UE for eating and grooming 100% of the time. Goal status: IN PROGRESS  4.  Pt will perform simple home maintenance tasks with supervision. Goal status: INITIAL  5.  Pt will perform environmental scanning/navigation with at least 95% accuracy in busy environment. Goal status: INITIAL  6.  Pt will be able to write at least 2 sentences with good legibility in reasonable amount of time. Goal status: IN PROGRESS  LONG TERM GOALS: Target date: 03/11/22  Pt will be independent with updated HEP Goal status: INITIAL  2.  Pt will improve R hand coordination for ADLs as shown by completing 9-hole peg test in less than 90sec. Baseline: only able to put in 1 peg within 17mn Goal status: INITIAL  3.  Pt will use RUE as dominant UE for eating and grooming at least 75% of the time. Goal status: INITIAL  4.  Pt will perform simple-mod complex home maintenance tasks mod I. Goal status: INITIAL  5.  Pt will improve RUE strength to be be able to retrieve 3lb object from overhead shelf safely x3. Goal status: INITIAL  6.  Pt will improve R grip strength to at least 30lbs to assist in lifting objects/opening containers. Goal status: INITIAL  7.  Pt will perform environmental scanning/navigation in busy environment with at least 95% accuracy for incr safety.   Goal status: INITIAL  ASSESSMENT:  CLINICAL IMPRESSION: Pt progressing towards  goals with improving strength, RUE ROM and coordination.  PERFORMANCE DEFICITS in functional skills including ADLs, IADLs, coordination, dexterity, sensation, ROM, strength, FMC, GMC, mobility, balance, decreased knowledge of precautions, decreased knowledge of use of DME, vision, and UE functional use,  cognitive skills including memory, and psychosocial skills including habits.   IMPAIRMENTS are limiting patient from ADLs, IADLs, work, leisure, and social participation.   COMORBIDITIES may have co-morbidities  that affects occupational performance. Patient will benefit from skilled OT to address above impairments and improve overall function.  MODIFICATION OR ASSISTANCE TO COMPLETE EVALUATION: Min-Moderate modification of tasks or assist with assess necessary to complete an evaluation.  OT OCCUPATIONAL PROFILE AND HISTORY: Detailed assessment: Review of records and additional review of physical, cognitive, psychosocial history related to current functional performance.  CLINICAL DECISION MAKING: Moderate - several treatment options, min-mod task modification necessary  REHAB POTENTIAL: Good  EVALUATION COMPLEXITY: Moderate    PLAN: OT FREQUENCY: 2x/week  OT DURATION: 12 weeks +eval  PLANNED INTERVENTIONS: self care/ADL training, therapeutic exercise, therapeutic activity, neuromuscular re-education, manual therapy, passive range of motion, balance training, functional mobility training, aquatic therapy, splinting, electrical stimulation, ultrasound, paraffin, fluidotherapy, moist heat, cryotherapy, patient/family education, cognitive remediation/compensation, visual/perceptual remediation/compensation, energy conservation, and DME and/or AE instructions  RECOMMENDED OTHER SERVICES: none at this time  CONSULTED AND AGREED WITH PLAN OF CARE: Patient  PLAN FOR NEXT SESSION:  continue neuro re-ed RUE, fx'al reaching and coordination RUE   Travonne Schowalter, OTR/L 01/14/2022, 12:04 PM KTheone Murdoch OTR/L [phone]: [phone]:04 PM 01/14/22

## 2022-01-14 NOTE — Therapy (Signed)
OUTPATIENT PHYSICAL THERAPY TREATMENT NOTE   Patient Name: Denise Herrera MRN: [id number] DOB:December 19, 1970, 51 y.o., female Today's Date: 01/14/2022  PCP: Antony Contras, MD REFERRING PROVIDER: Cathlyn Parsons, PA-C  END OF SESSION:   PT End of Session - 01/14/22 0858     Visit Number 10    Number of Visits 13   12+eval   Date for PT Re-Evaluation 02/06/22   pushed out due to scheduling delay   Authorization Type [employer]    PT Start Time 0930    PT Stop Time 1012    PT Time Calculation (min) 42 min    Equipment Utilized During Treatment Gait belt    Activity Tolerance Patient tolerated treatment well    Behavior During Therapy WFL for tasks assessed/performed             Past Medical History:  Diagnosis Date   Chronic insomnia    History of migraine headaches    Narcolepsy without cataplexy(347.00)    Past Surgical History:  Procedure Laterality Date   CESAREAN SECTION     gyn cervical procedure     Patient Active Problem List   Diagnosis Date Noted   ICH (intracerebral hemorrhage) ([place]) 11/03/2021   Essential hypertension 09/05/2015   INSOMNIA, CHRONIC 03/01/2007   MIGRAINE HEADACHE 03/01/2007   Narcolepsy without cataplexy 03/01/2007    REFERRING DIAG: I61.0 (ICD-10-CM) - Nontraumatic subcortical hemorrhage of left cerebral hemisphere ([place])   THERAPY DIAG:  Muscle weakness (generalized)  Other abnormalities of gait and mobility  Unsteadiness on feet  Hemiplegia and hemiparesis following nontraumatic intracerebral hemorrhage affecting right dominant side ([place])  Rationale for Evaluation and Treatment Rehabilitation  PERTINENT HISTORY: ADHD, migraines, HTN, hyperlipidemia   Presented 11/05/2021 with acute onset of right-sided weakness and aphasia.  Left CVA/ICH confirmed. Admitted to rehab on 11/08/2021 and discharged 11/29/2021.  PRECAUTIONS: Fall  SUBJECTIVE: Reports appt with PCP went well, reports has follow up in 3 months. No new  changes. Has some tightness in the shoulder this morning. Reports it feels better after the OT session.   PAIN:  Are you having pain? No  VITALS: Before session (LUE):  Today's Vitals   01/14/22 0934  BP: 128/87  Pulse: 64     TODAY'S TREATMENT:  CURB:  Level of Assistance: SBA Assistive device utilized: Environmental manager Comments: completed curb x 3 reps outdoors, able to ascend/descend with alternating LE. No significant difference between leading with LLE vs. RLE. Supervision overall. Proper sequencing of AD. No imbalance noted.   STAIRS:  Level of Assistance: SBA  Stair Negotiation Technique: Step to Pattern Sideways with No Rails  Number of Stairs: 16   Height of Stairs: 6"  Comments: with use of AD, completed ascend/descending stairs, intermittent cues for sequencing. More stability noted with use of AD and able to maintain hips aligned forward vs turning laterally without use of AD. Encouraged use of AD for safety with descending stairs and promoting improved technique.   GAIT: Gait pattern: step through pattern, decreased arm swing- Right, genu recurvatum- Right, and genu recurvatum- Left Distance walked: 400'  Assistive device utilized: Quad cane small base Level of assistance: Modified independence and SBA Comments: Continued ambulation with use of SBQC indoors and outdoors on unlevel surfaces including grass and paved surfaces, completed ambulation x 300 ft with use of AD, and then x 100 ft without AD. AFO donned for all ambulation. Mild unsteadiness noted on grass without use of AD, but overall  tolerating gait on unlevel surfaces well.   Completed all NMR without AFO to work on improved quad control and ankle mobility:   With RLE placed on ramp, completed anterior/posterior steps forward with LLE working on improved quad/knee control on RLE, completed x 15 reps.   Rockerboards: standing on rockerboard A/P without UE support and completed anterior/posterior  weight shift x 15 reps working on improved balance ankle strategies. Then maintaining board steady completed eyes open with horizontal/vertical head turns x 10 reps each direction. Then holding steady completed Eyes Closed 3 x 20-25 seconds, increased posterior sway noted requiring intermittent UE support and CGA from PT.   Sit to Stands: with RLE posterior and no AFO donned, completed staggered sit to stands x 10 reps withoue UE support and focus on eccentric control.      PATIENT EDUCATION: Education details:  Continue HEP Person educated: Patient Education method: Explanation Education comprehension: verbalized understanding   HOME EXERCISE PROGRAM: Access Code: [facility] URL: [email or web address] Date: 12/30/2021 Prepared by: Elease Etienne  Exercises - Standing Terminal Knee Extension with Resistance  - 1 x daily - 7 x weekly - 3 sets - 10 reps - Forward Step Up with Counter Support  - 1 x daily - 7 x weekly - 3 sets - 10 reps - Standing Single Leg Stance with Counter Support  - 1 x daily - 7 x weekly - 3 sets - 10 reps - Squat  - 1 x daily - 7 x weekly - 3 sets - 10 reps - Side Stepping with Resistance at Thighs  - 1 x daily - 7 x weekly - 3 sets - 10 reps (green theraband) - Forward and Backward Monster Walk with Counter Support  - 1 x daily - 7 x weekly - 3 sets - 10 reps (green theraband)  GOALS: Goals reviewed with patient? Yes  SHORT TERM GOALS: Target date: 01/02/2022  Pt will be independent with strength and balance HEP with supervision from family as needed. Baseline:  01/02/22: met with current HEP Goal status: MET  2.  Pt will navigate 4 stairs w/o handrails reciprocally at supervision level to improve safety with functional mobility in her home. Baseline:  01/07/22: able to use reciprocal pattern, however continues to need rails.  Goal status: PARTIALLY MET  3.  Pt will ambulate >/=375 feet over indoor surfaces with LRAD and supervision level of  assist to promote household and community access. Baseline: 01/02/22: 850 feet with RW today  Goal status: MET  4.  Pt will decrease 5xSTS to </=13 seconds in order to demonstrate decreased risk for falls and improved functional bilateral LE strength and power. Baseline: 01/02/22: 13.69 sec's no UE support Goal status: MET  5.  Pt will ambulate>/=300 feet on 2MWT to demonstrate improved endurance for functional tasks in home and community. Baseline:  01/02/22: 280 feet, improved by 45 feet, just not to goal Goal status: MET  6.  Pt will demonstrate a gait speed of >/=2.20 feet/sec in order to decrease risk for falls. Baseline: 01/02/22: 2.74 ft/sec with RW Goal status: MET  LONG TERM GOALS: Target date: 01/23/2022  Pt will increase FOTO score to >/=75% in order to demonstrate subjective functional improvement. Baseline: 68% Goal status: INITIAL  2.  Pt will navigate 16 stairs using no rails supervision level reciprocally to improve safe functional mobility in home environment. Baseline: 4 step-to leaning against half wall/rail Goal status: INITIAL  3.  Pt will ambulate >/=750 feet with LRAD and ModI level  of assist over various indoor and outdoor surfaces to promote household and community access. Baseline: 254' SBA RW Goal status: INITIAL  4.  Pt will demonstrate a gait speed of >/=2.4 feet/sec in order to decrease risk for falls. Baseline: 1.90 ft/sec Goal status: INITIAL  ASSESSMENT:  CLINICAL IMPRESSION: Continued use of SBQC today during session on unlevel and outdoor surfaces, patient tolerating well. Did encourage use of AD on stairs due to improved sequencing/technique with descent to promote safety. Continued NMR focused on RLE strengthening. Will continue per POC.   OBJECTIVE IMPAIRMENTS Abnormal gait, decreased balance, decreased cognition, decreased endurance, and decreased strength.   ACTIVITY LIMITATIONS carrying, lifting, and locomotion level  PARTICIPATION  LIMITATIONS: driving, community activity, occupation, and yard work  PERSONAL FACTORS Age, Fitness, Transportation, and 1 comorbidity: HTN are also affecting patient's functional outcome.   REHAB POTENTIAL: Excellent  CLINICAL DECISION MAKING: Stable/uncomplicated  EVALUATION COMPLEXITY: Low  PLAN: PT FREQUENCY: 2x/week  PT DURATION: 6 weeks  PLANNED INTERVENTIONS: Therapeutic exercises, Therapeutic activity, Neuromuscular re-education, Balance training, Gait training, Patient/Family education, Self Care, Stair training, Vestibular training, DME instructions, Electrical stimulation, Biofeedback, and Re-evaluation  PLAN FOR NEXT SESSION:  Modify HEP prn for right sided quad and glut strength and high level balance.  Gait training-use NBQC rubber tip on stairs and outdoors over uneven surfaces, stair training, pt may benefit from Bioness in the future- w/ cane.  Continue to work on right LE strengthening with focus on increased knee control.  Continue adv/retreat w/ incline blocks/compliant wedge, have pt practice ambulating up and down incline surfaces like ramp.  Heel taps.  SLS tasks.   Leg press.  Hip drives, hip strengthening.   Arna Snipe PT, DPT Outpatient Neuro [facility] [address], [place], [place] [address] [phone] 01/14/22, 10:59 AM

## 2022-01-16 ENCOUNTER — Ambulatory Visit: Payer: BLUE CROSS/BLUE SHIELD | Attending: Physician Assistant | Admitting: Physical Therapy

## 2022-01-16 ENCOUNTER — Ambulatory Visit: Payer: BLUE CROSS/BLUE SHIELD | Admitting: Speech Pathology

## 2022-01-16 VITALS — BP 131/88 | HR 67

## 2022-01-16 DIAGNOSIS — R41841 Cognitive communication deficit: Secondary | ICD-10-CM | POA: Insufficient documentation

## 2022-01-16 DIAGNOSIS — R2689 Other abnormalities of gait and mobility: Secondary | ICD-10-CM | POA: Insufficient documentation

## 2022-01-16 DIAGNOSIS — R2681 Unsteadiness on feet: Secondary | ICD-10-CM | POA: Diagnosis not present

## 2022-01-16 DIAGNOSIS — R278 Other lack of coordination: Secondary | ICD-10-CM | POA: Insufficient documentation

## 2022-01-16 DIAGNOSIS — I69151 Hemiplegia and hemiparesis following nontraumatic intracerebral hemorrhage affecting right dominant side: Secondary | ICD-10-CM | POA: Diagnosis not present

## 2022-01-16 DIAGNOSIS — R208 Other disturbances of skin sensation: Secondary | ICD-10-CM | POA: Diagnosis not present

## 2022-01-16 DIAGNOSIS — R4701 Aphasia: Secondary | ICD-10-CM | POA: Diagnosis not present

## 2022-01-16 DIAGNOSIS — I69153 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: Secondary | ICD-10-CM | POA: Insufficient documentation

## 2022-01-16 DIAGNOSIS — M6281 Muscle weakness (generalized): Secondary | ICD-10-CM | POA: Insufficient documentation

## 2022-01-16 DIAGNOSIS — R41842 Visuospatial deficit: Secondary | ICD-10-CM | POA: Diagnosis not present

## 2022-01-16 NOTE — Patient Instructions (Addendum)
Keeping your Brain Healthy MIND diet healthy items the MIND diet guidelines* suggest include: 3+ servings a day of whole grains 1+ servings a day of vegetables (other than green leafy) 6+ servings a week of green leafy vegetables Consider dosing if you won't be having a salad: eat a daily handful of spinach, they make greens supplements  5+ servings a week of nuts 4+ meals a week of beans 2+ servings a week of berries 2+ meals a week of poultry 1+ meals a week of fish Mainly olive oil if added fat is used The unhealthy items, which are higher in saturated and trans fat, include: Less than 5 servings a week of pastries and sweets Less than 4 servings a week of red meat (including beef, pork, lamb, and products made from these meats) Less than one serving a week of cheese and fried foods Less than 1 tablespoon a day of butter   Exercise Consider what can I do safely?  Ask you PT, what can I do? Optimal exercise dosing in 150 minutes a week-- you can see benefits just increasing a little  Choreographed dancing is a wonderful physical and mental exercise!!   Cognitive work Challenge your brain Learn something new about a topic that is interesting to you Write down 3-5 things you learned After 10-15 minutes, see what you remember May be helpful to read again Share what you learned with a communication partner Partake in and learn a new hobby Plan a menu Participate in household chores and decisions  Participate in managing finances Plan a party, trip or tailgate with all of the details  Participate in your current hobbies Google search for items (even if you're not really going to buy anything) and compare prices and features   Social Interaction Engaging with others is good for your language, your mental health, and your brain function  

## 2022-01-16 NOTE — Therapy (Signed)
OUTPATIENT PHYSICAL THERAPY TREATMENT NOTE   Patient Name: Denise Herrera MRN: [id number] DOB:01-Apr-1971, 51 y.o., female Today's Date: 01/16/2022  PCP: Antony Contras, MD REFERRING PROVIDER: Cathlyn Parsons, PA-C  END OF SESSION:   PT End of Session - 01/16/22 1019     Visit Number 11    Number of Visits 13   12+eval   Date for PT Re-Evaluation 02/06/22   pushed out due to scheduling delay   Authorization Type [employer]    PT Start Time 1018    PT Stop Time 1058    PT Time Calculation (min) 40 min    Equipment Utilized During Treatment --    Activity Tolerance Patient tolerated treatment well    Behavior During Therapy WFL for tasks assessed/performed              Past Medical History:  Diagnosis Date   Chronic insomnia    History of migraine headaches    Narcolepsy without cataplexy(347.00)    Past Surgical History:  Procedure Laterality Date   CESAREAN SECTION     gyn cervical procedure     Patient Active Problem List   Diagnosis Date Noted   ICH (intracerebral hemorrhage) ([place]) 11/03/2021   Essential hypertension 09/05/2015   INSOMNIA, CHRONIC 03/01/2007   MIGRAINE HEADACHE 03/01/2007   Narcolepsy without cataplexy 03/01/2007    REFERRING DIAG: I61.0 (ICD-10-CM) - Nontraumatic subcortical hemorrhage of left cerebral hemisphere ([place])   THERAPY DIAG:  Muscle weakness (generalized)  Other abnormalities of gait and mobility  Unsteadiness on feet  Rationale for Evaluation and Treatment Rehabilitation  PERTINENT HISTORY: ADHD, migraines, HTN, hyperlipidemia   Presented 11/05/2021 with acute onset of right-sided weakness and aphasia.  Left CVA/ICH confirmed. Admitted to rehab on 11/08/2021 and discharged 11/29/2021.  PRECAUTIONS: Fall  SUBJECTIVE: Pt reports her shoulder pain is better after learning how to decrease compensatory movements with OT. Pt reporting she doesn't wear her RLE brace at home.  PAIN:  Are you having pain?  No  VITALS: Before session (LUE):  Today's Vitals   01/16/22 1021  BP: 131/88  Pulse: 67      TODAY'S TREATMENT:  THER ACT: Sit to stands 2 x 10 reps from EOM with 1" step and then 1" wedge under LLE to encourage weight shift to the R, min cueing needed to increase WB through RLE  Added to HEP, see bolded below   THER EX:  Seated leg press on machine:  -60# with BLE, 2 x 15 reps  -30# with RLE, 2 x 15 reps with focus on eccentric control   NMR:  Standing ball kicks initially with LUE support on chair progressing to no UE support Standing ball stop and kick initially with LUE support on chair progressing to no UE support with min A for balance  PATIENT EDUCATION: Education details:  Continue HEP Person educated: Patient Education method: Explanation Education comprehension: verbalized understanding   HOME EXERCISE PROGRAM: Access Code: [facility] URL: [email or web address] Date: 12/30/2021 Prepared by: Elease Etienne  Exercises - Standing Terminal Knee Extension with Resistance  - 1 x daily - 7 x weekly - 3 sets - 10 reps - Forward Step Up with Counter Support  - 1 x daily - 7 x weekly - 3 sets - 10 reps - Standing Single Leg Stance with Counter Support  - 1 x daily - 7 x weekly - 3 sets - 10 reps - Squat  - 1 x daily - 7 x weekly - 3  sets - 10 reps - Side Stepping with Resistance at Thighs  - 1 x daily - 7 x weekly - 3 sets - 10 reps (green theraband) - Forward and Backward Monster Walk with Counter Support  - 1 x daily - 7 x weekly - 3 sets - 10 reps (green theraband) - Sit to Stand with Armchair (Wedge Under L Foot) - 1 x daily - 7 x weekly - 3 sets - 10 reps  GOALS: Goals reviewed with patient? Yes  SHORT TERM GOALS: Target date: 01/02/2022  Pt will be independent with strength and balance HEP with supervision from family as needed. Baseline:  01/02/22: met with current HEP Goal status: MET  2.  Pt will navigate 4 stairs w/o handrails  reciprocally at supervision level to improve safety with functional mobility in her home. Baseline:  01/07/22: able to use reciprocal pattern, however continues to need rails.  Goal status: PARTIALLY MET  3.  Pt will ambulate >/=375 feet over indoor surfaces with LRAD and supervision level of assist to promote household and community access. Baseline: 01/02/22: 850 feet with RW today  Goal status: MET  4.  Pt will decrease 5xSTS to </=13 seconds in order to demonstrate decreased risk for falls and improved functional bilateral LE strength and power. Baseline: 01/02/22: 13.69 sec's no UE support Goal status: MET  5.  Pt will ambulate>/=300 feet on 2MWT to demonstrate improved endurance for functional tasks in home and community. Baseline:  01/02/22: 280 feet, improved by 45 feet, just not to goal Goal status: MET  6.  Pt will demonstrate a gait speed of >/=2.20 feet/sec in order to decrease risk for falls. Baseline: 01/02/22: 2.74 ft/sec with RW Goal status: MET  LONG TERM GOALS: Target date: 01/23/2022  Pt will increase FOTO score to >/=75% in order to demonstrate subjective functional improvement. Baseline: 68% Goal status: INITIAL  2.  Pt will navigate 16 stairs using no rails supervision level reciprocally to improve safe functional mobility in home environment. Baseline: 4 step-to leaning against half wall/rail Goal status: INITIAL  3.  Pt will ambulate >/=750 feet with LRAD and ModI level of assist over various indoor and outdoor surfaces to promote household and community access. Baseline: 254' SBA RW Goal status: INITIAL  4.  Pt will demonstrate a gait speed of >/=2.4 feet/sec in order to decrease risk for falls. Baseline: 1.90 ft/sec Goal status: INITIAL  ASSESSMENT:  CLINICAL IMPRESSION: Emphasis of skilled PT session on working on continuing to increase WB on RLE in stance and on increasing RLE strength. Pt continues to favor LLE with sit to stand transfers and with static  stance. Added to HEP to continue to have pt work on increasing weight shift to R side with transfer and increase RLE WB in stance. Pt continues to benefit from skilled therapy services to address ongoing balance and strength impairments. Continue POC.   OBJECTIVE IMPAIRMENTS Abnormal gait, decreased balance, decreased cognition, decreased endurance, and decreased strength.   ACTIVITY LIMITATIONS carrying, lifting, and locomotion level  PARTICIPATION LIMITATIONS: driving, community activity, occupation, and yard work  PERSONAL FACTORS Age, Fitness, Transportation, and 1 comorbidity: HTN are also affecting patient's functional outcome.   REHAB POTENTIAL: Excellent  CLINICAL DECISION MAKING: Stable/uncomplicated  EVALUATION COMPLEXITY: Low  PLAN: PT FREQUENCY: 2x/week  PT DURATION: 6 weeks  PLANNED INTERVENTIONS: Therapeutic exercises, Therapeutic activity, Neuromuscular re-education, Balance training, Gait training, Patient/Family education, Self Care, Stair training, Vestibular training, DME instructions, Electrical stimulation, Biofeedback, and Re-evaluation  PLAN FOR  NEXT SESSION:  Modify HEP prn for right sided quad and glut strength and high level balance.  Gait training-use NBQC rubber tip on stairs and outdoors over uneven surfaces, stair training, pt may benefit from Bioness in the future- w/ cane.  Continue to work on right LE strengthening with focus on increased knee control.  Continue adv/retreat w/ incline blocks/compliant wedge, have pt practice ambulating up and down incline surfaces like ramp.  Heel taps.  SLS tasks.   Leg press.  Hip drives, hip strengthening.   Taylor Turkalo, PT, DPT, CSRS  [facility] [address], [address] [place], [place] [address] [phone] 01/16/22, 11:00 AM    

## 2022-01-16 NOTE — Therapy (Signed)
OUTPATIENT SPEECH LANGUAGE PATHOLOGY TREATMENT NOTE   Patient Name: Denise Herrera MRN: [id number] DOB:04-12-1971, 51 y.o., female Today's Date: 01/16/2022  PCP: Antony Contras MD REFERRING PROVIDER: Cathlyn Parsons, PA-C   END OF SESSION:   End of Session - 01/16/22 0929     Visit Number 9    Number of Visits 17    Date for SLP Re-Evaluation 02/06/22    Authorization Type [employer]    SLP Start Time 0930    SLP Stop Time  1015    SLP Time Calculation (min) 45 min    Activity Tolerance Patient tolerated treatment well                    Past Medical History:  Diagnosis Date   Chronic insomnia    History of migraine headaches    Narcolepsy without cataplexy(347.00)    Past Surgical History:  Procedure Laterality Date   CESAREAN SECTION     gyn cervical procedure     Patient Active Problem List   Diagnosis Date Noted   ICH (intracerebral hemorrhage) ([place]) 11/03/2021   Essential hypertension 09/05/2015   INSOMNIA, CHRONIC 03/01/2007   MIGRAINE HEADACHE 03/01/2007   Narcolepsy without cataplexy 03/01/2007    ONSET DATE: 11/03/2021    REFERRING DIAG: I61.0 (ICD-10-CM) - Nontraumatic subcortical hemorrhage of left cerebral hemisphere   THERAPY DIAG:  Cognitive communication deficit  Aphasia  Rationale for Evaluation and Treatment Rehabilitation  SUBJECTIVE: "same old, same old"   PAIN:  Are you having pain? No  SPEECH THERAPY DISCHARGE SUMMARY  Visits from Start of Care: 9   Current functional level related to goals / functional outcomes: Is participating successfully in all desired activities, demonstrating improved discourse without overt anomia, is successfully utilized trained external cognitive devices   Remaining deficits: High level word finding   Education / Equipment: Cognitive strategies/compensations, anomia strategies, healthy lifestyle factors, HEP   Patient agrees to discharge. Patient goals were met. Patient is being discharged  due to meeting the stated rehab goals.    OBJECTIVE:  TODAY'S TREATMENT:  01-16-22: SLP provided education on factors to optimize her brain health. Reviewed previously targeted strategies, pt without questions. Verbalizes successful implementing of previously targeted strategies and compensations. Reports participation in all desired activities, has yet to return to work. Advised that should pt require additional SLP services as cognitive demands change, should request new referral from PCP. Pt was able to teach back x5 strategies with occasional min-A. Pt agreeable to d/c, is pleased with current functional status.   01-14-22: Denise Herrera tells SLP she is doing well at home, no overt difficulties with participation in desired activities. PT able to relay functional problem solving solution in which she determine appropriate action to rectify bus issues for her grandson. SLP provided education on strategies to enhance attention during reading tasks to include limiting internal and external distractions, reading during optimal time of day, and highlighting key words. SLP provided systematic instruction for use of spaced learning to aid in retention and recall of read information. Led pt through simulation of strategy with pt-selected article. Following x2 readings with 7 minute latency period, pt able to relay x3 pertinent details from article with some vague language exhibited. SLP inquires if this is due to lack of understanding or retention, pt says "no, it was just boring". Note there was mild improvement between 1st and 2nd retelling evidenced through cohesiveness of retell. Following both reading, pt able to identify x2 distractions which may have  impacted attention during reading task. SLP advised that based on pt report of successful participation in all desired activities and reported improvement in overall cognitive functioning, w/o family members present to provide additional information, will plan to d/c at  next session. Pt is in agreement, verbalizes appreciation for therapeutic interventions provided.   01-09-22: Pt report word finding going well, rare cueing from husband required. Overall feels able to meet communication needs with variety of communication partners. SLP facilitated discussion regarding pt's current strategy for financial management. Pt able to ID x6 reoccurring bills IND, additional x5 with questioning cues from SLP. Pt using reminders to aid in recall of need to pay bills. SLP provides pt with additional strategies pt can use to aid in successful execution: don't delay, keep reminders/emails unread, making master checklist to be used/referenced monthly. Discussion on benefit from creating routine, accountability partners, and external aid to optimize participation in desired activities such as completion of HEP, daily readings, and household activities. Pt verbalizes understanding via teach back with occasional min-A.   01-07-22: Pt reports improved performance in iADLs and preferred activities, though has not yet returned to work. Overall, reports successful completion of desired tasks. SLP gently probes, pt denies any overt mistakes impacting performance. SLP provides education on activities to improve cogntive functioning and verbal expression. Reports ongoing anomia when participating in more complex conversations, though is compensating using synonym or circumlocution. No overt anomia/dysnomia demonstrated throughout conversation during today's session. Pt is reporting difficulties processing visually presented information when reading. Will plan to target next session.   PATIENT EDUCATION: Education details: see above Person educated: Patient Education method: Customer service manager Education comprehension: verbalized understanding, returned demonstration, and needs further education     GOALS: Goals reviewed with patient? Yes   SHORT TERM GOALS: Target date: 01/08/2022      Pt will implement word retrieval strategies during functional naming tasks and/or structured conversation given occasional min A over 2 sessions Baseline: 12-24-21, 12-26-21 Goal status: Met   2.  Pt will demonstrate increased emergent  and anticipatory awareness of errors during structured tasks related to home/work given occasional min A over 2 sessions  Baseline: 12-26-21, 01-09-22 Goal status: met   3.  Pt will ID functional deficits and verbalize appropriate solutions to optimize performance given occasional min A over 2 sessions Baseline: 12-24-21, 01-09-22 Goal status: met   4.  Pt will verbalize detailed synopsis of work related tasks given occasional min A over 2 sessions  Baseline: 12-26-21 Goal status:  met     LONG TERM GOALS: Target date: 02/06/2022     Pt will implement word retrieval strategies during 25+ minute unstructured conversation with ability to provide detailed information on related topic given rare min A over 2 sessions Baseline: 12-26-21; 01-09-22 Goal status: met   2.  Pt will verbalize and implement recommended cognitive compensations to optimize performance and task completion at home/work with rare min A over 2 sessions  Baseline: 12-26-21; 01-16-22 Goal status: met   3.  Pt will demonstrate increased awareness of functional deficits with pt able to verbalize deficits, identify appropriate strategy, and implement strategy given rare min A over 2 sessions  Baseline: 01-16-22 Goal status: met   4.  Pt will report improved communication effectiveness via PROM by 2 points at last ST session Baseline: CPIB=19 Goal status: deferred d/t time constraints       ASSESSMENT:   CLINICAL IMPRESSION: Patient is a 51 y.o. female who was seen today for CVA  in June 2023. [place] significant for ADHD. Conducted ongoing education and training of word retrieval strategies to maximize verbal expression as well as targeted recall for functional household tasks and compensations to  optimize patient awareness and return to prior functioning. ST targeted cognitive strategies and compensations to optimize participation in desired activities.  Pt is pleased with current functional status, is agreeable to ST d/c.    OBJECTIVE IMPAIRMENTS include memory, awareness, executive functioning, and aphasia. These impairments are limiting patient from return to work, household responsibilities, and effectively communicating at home and in community. Factors affecting potential to achieve goals and functional outcome are cooperation/participation level and previous level of function. Patient will benefit from skilled SLP services to address above impairments and improve overall function.   REHAB POTENTIAL: Good   PLAN: SLP FREQUENCY: 2x/week   SLP DURATION: 8 weeks   PLANNED INTERVENTIONS: Language facilitation, Cueing hierachy, Cognitive reorganization, Internal/external aids, Functional tasks, Multimodal communication approach, SLP instruction and feedback, Compensatory strategies, and Patient/family education   Su Monks, CCC-SLP 01/16/2022, 9:31 AM

## 2022-01-21 ENCOUNTER — Ambulatory Visit: Payer: BLUE CROSS/BLUE SHIELD | Admitting: Physical Therapy

## 2022-01-21 ENCOUNTER — Encounter: Payer: Self-pay | Admitting: Occupational Therapy

## 2022-01-21 ENCOUNTER — Ambulatory Visit: Payer: BLUE CROSS/BLUE SHIELD | Admitting: Occupational Therapy

## 2022-01-21 ENCOUNTER — Ambulatory Visit: Payer: BLUE CROSS/BLUE SHIELD | Admitting: Speech Pathology

## 2022-01-21 ENCOUNTER — Encounter: Payer: Self-pay | Admitting: Physical Therapy

## 2022-01-21 DIAGNOSIS — R4701 Aphasia: Secondary | ICD-10-CM | POA: Diagnosis not present

## 2022-01-21 DIAGNOSIS — R2689 Other abnormalities of gait and mobility: Secondary | ICD-10-CM

## 2022-01-21 DIAGNOSIS — R41842 Visuospatial deficit: Secondary | ICD-10-CM | POA: Diagnosis not present

## 2022-01-21 DIAGNOSIS — R278 Other lack of coordination: Secondary | ICD-10-CM | POA: Diagnosis not present

## 2022-01-21 DIAGNOSIS — M6281 Muscle weakness (generalized): Secondary | ICD-10-CM

## 2022-01-21 DIAGNOSIS — R208 Other disturbances of skin sensation: Secondary | ICD-10-CM | POA: Diagnosis not present

## 2022-01-21 DIAGNOSIS — I69151 Hemiplegia and hemiparesis following nontraumatic intracerebral hemorrhage affecting right dominant side: Secondary | ICD-10-CM

## 2022-01-21 DIAGNOSIS — R41841 Cognitive communication deficit: Secondary | ICD-10-CM | POA: Diagnosis not present

## 2022-01-21 DIAGNOSIS — R2681 Unsteadiness on feet: Secondary | ICD-10-CM

## 2022-01-21 DIAGNOSIS — I69153 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: Secondary | ICD-10-CM | POA: Diagnosis not present

## 2022-01-21 NOTE — Patient Instructions (Signed)
Cat / Cow Flow    Inhale, press spine toward ceiling like a Halloween cat. Keeping strength in arms and abdominals, exhale to soften spine through neutral and into cow pose. Open chest and arch back. Initiate movement between cat and cow at tailbone, one vertebrae at a time. Keep weight even b/t Rt and Lt side Repeat _5-10___ times SLOWLY.  All Fours Shoulder Flexion / Extension    Place hands and knees shoulder-width apart, rock back and sit on legs, then rock forward over hands and forearms. Repeat __10__ times or for ____ minutes. Do _1-2___ sessions per day.  Triceps Activities: Push-Ups    Seated in chair with arms slightly behind you, pushes down on armrests to lift bottom off seat to a squat position. Visualize shoulder blades going down towards bottom as you come up. Repeat slowly 5-10 times, 2x/day

## 2022-01-21 NOTE — Therapy (Signed)
OUTPATIENT OCCUPATIONAL THERAPY NEURO Treatment  Patient Name: Denise Herrera MRN: [id number] DOB:[birth year], 51 y.o., female Today's Date: 01/21/2022  PCP: Dr. Antony Contras REFERRING PROVIDER: Cathlyn Parsons, PA-C    OT End of Session - 01/21/22 0935     Visit Number 6    Number of Visits 25    Date for OT Re-Evaluation 03/11/22    Authorization Type [employer], covered 100% (OOP met), visit limit--[place]    OT Start Time 0930    OT Stop Time 1015    OT Time Calculation (min) 45 min    Activity Tolerance Patient tolerated treatment well    Behavior During Therapy WFL for tasks assessed/performed                Past Medical History:  Diagnosis Date   Chronic insomnia    History of migraine headaches    Narcolepsy without cataplexy(347.00)    Past Surgical History:  Procedure Laterality Date   CESAREAN SECTION     gyn cervical procedure     Patient Active Problem List   Diagnosis Date Noted   ICH (intracerebral hemorrhage) ([place]) 11/03/2021   Essential hypertension 09/05/2015   INSOMNIA, CHRONIC 03/01/2007   MIGRAINE HEADACHE 03/01/2007   Narcolepsy without cataplexy 03/01/2007    ONSET DATE: 11/05/21  REFERRING DIAG: I61.0 (ICD-10-CM) - Nontraumatic subcortical hemorrhage of left cerebral hemisphere ([place])   THERAPY DIAG:  Hemiplegia and hemiparesis following nontraumatic intracerebral hemorrhage affecting right dominant side ([place])  Other lack of coordination  Unsteadiness on feet  Rationale for Evaluation and Treatment Rehabilitation  SUBJECTIVE:   SUBJECTIVE STATEMENT: I haven't done my exercises this week  Pt accompanied by: significant other present for first half of eval (then had to leave for work)  PERTINENT HISTORY: 11/05/2021 with acute onset of right-sided weakness and aphasia. Blood pressure mildly elevated 141/85. CT of the head showed acute parenchymal hemorrhage in the left corona radiata with intraventricular extension.  Hospital d/c  11/28/21.  PMH:  history of ADHD maintained on Ritalin, migraine headaches, hypertension, hyperlipidemia, hx of narcolepsy without cataplex, Hx of R RTC repair 03/2021.     PRECAUTIONS: Fall  WEIGHT BEARING RESTRICTIONS No  PAIN:  Are you having pain? No  FALLS: Has patient fallen in last 6 months? 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 Progressed to scapula protraction and retraction holding 3 lb weight at 90* sh flex in supine  Seated cane exercise for right shoulder flexion to just above eye level, mirror and min-mod v.c for positioning and tactile cues to Rt scapula for downward depression,  Seated at chair - chair push ups w/ min tactile cues Rt scapula for downward depression  UBE x 6 min, level 2 for normal reciprocal movement pattern and UB conditioning   PATIENT EDUCATION: Education details: updated HEP Person educated: Patient Education method: Explanation, Demonstration, Verbal cues,  Education comprehension: verbalized understanding, returned demonstration, and  verbal cues required    HOME EXERCISE PROGRAM: 8/18/23coordination HEP 01/07/22: Shoulder HEP  01/21/22: Updated to HEP for proximal strengthening  GOALS: Potential Goals reviewed with patient? Yes  SHORT TERM GOALS: Target date: 01/23/22- extended due to schedule  Pt will be independent with initial  HEP. Goal status: MET  2.  Pt will improve dominant RUE coordination/functional reaching for ADLs as shown by improving score on box and blocks test by at least 8. Baseline: 34 blocks Goal status: IN PROGRESS  3.  Pt will use RUE as dominant UE for eating and grooming 100% of the time. Goal status: IN PROGRESS (Eating 100%, grooming 50%)   4.  Pt will perform simple home maintenance tasks with supervision. Goal status: MET  5.  Pt will perform environmental scanning/navigation with at least 95% accuracy in busy environment. Goal status: INITIAL  6.  Pt will be able to write at least 2 sentences with good legibility in reasonable amount of time. Goal status: MET  LONG TERM GOALS: Target date: 03/11/22  Pt will be independent with updated HEP Goal status: INITIAL  2.  Pt will improve R hand coordination for ADLs as shown by completing 9-hole peg test in less than 90sec. Baseline: only able to put in 1 peg within 73mn Goal status: INITIAL  3.  Pt will use RUE as dominant UE for eating and grooming at least 75% of the time. Goal status: INITIAL  4.  Pt will perform simple-mod complex home maintenance tasks mod I. Goal status: INITIAL  5.  Pt will improve RUE strength to be be able to retrieve 3lb object from overhead shelf safely x3. Goal status: INITIAL  6.  Pt will improve R grip strength to at least 30lbs to assist in lifting objects/opening containers. Goal status: INITIAL  7.  Pt will perform environmental scanning/navigation in busy environment with at least 95% accuracy for incr safety.   Goal status: INITIAL  ASSESSMENT:  CLINICAL IMPRESSION: Pt progressing towards STG's and has met 3/6 goals at this time. Pt w/ greater understanding of preventing sh compensations  PERFORMANCE DEFICITS in functional skills including ADLs, IADLs, coordination, dexterity, sensation, ROM, strength, FMC, GMC, mobility, balance, decreased knowledge of precautions, decreased knowledge of  use of DME, vision, and UE functional use, cognitive skills including memory, and psychosocial skills including habits.   IMPAIRMENTS are limiting patient from ADLs, IADLs, work, leisure, and social participation.   COMORBIDITIES may have co-morbidities  that affects occupational performance. Patient will benefit from skilled OT to address above impairments and improve overall function.  MODIFICATION OR ASSISTANCE TO COMPLETE EVALUATION: Min-Moderate modification of tasks or assist with assess necessary to complete an evaluation.  OT OCCUPATIONAL PROFILE AND HISTORY: Detailed assessment: Review of records and additional review of physical, cognitive, psychosocial history related to current functional performance.  CLINICAL DECISION MAKING: Moderate - several treatment options, min-mod task modification necessary  REHAB POTENTIAL: Good  EVALUATION COMPLEXITY: Moderate    PLAN: OT FREQUENCY: 2x/week  OT DURATION: 12 weeks +eval  PLANNED INTERVENTIONS: self care/ADL training, therapeutic exercise, therapeutic activity, neuromuscular re-education, manual therapy, passive range of motion, balance training, functional mobility training, aquatic therapy, splinting, electrical stimulation, ultrasound, paraffin, fluidotherapy, moist heat, cryotherapy, patient/family education, cognitive remediation/compensation, visual/perceptual remediation/compensation, energy conservation, and DME and/or AE instructions  RECOMMENDED OTHER SERVICES: none at this time  CONSULTED AND AGREED WITH PLAN OF CARE: Patient  PLAN FOR NEXT SESSION:  continue neuro re-ed RUE, fx'al reaching, environmental scanning   KAlejandro Mulling  Jetty Berland, OTR/L 01/21/2022, 9:35 AM

## 2022-01-21 NOTE — Therapy (Signed)
OUTPATIENT PHYSICAL THERAPY TREATMENT NOTE   Patient Name: Denise Herrera MRN: [id number] DOB:1970/05/31, 51 y.o., female Today's Date: 01/21/2022  PCP: Antony Contras, MD REFERRING PROVIDER: Cathlyn Parsons, PA-C  END OF SESSION:   PT End of Session - 01/21/22 1104     Visit Number 12    Number of Visits 13   12+eval   Date for PT Re-Evaluation 02/06/22   pushed out due to scheduling delay   Authorization Type [employer]    PT Start Time 1102    PT Stop Time 1144    PT Time Calculation (min) 42 min    Equipment Utilized During Treatment Other (comment)   Bioness   Activity Tolerance Patient tolerated treatment well    Behavior During Therapy WFL for tasks assessed/performed              Past Medical History:  Diagnosis Date   Chronic insomnia    History of migraine headaches    Narcolepsy without cataplexy(347.00)    Past Surgical History:  Procedure Laterality Date   CESAREAN SECTION     gyn cervical procedure     Patient Active Problem List   Diagnosis Date Noted   ICH (intracerebral hemorrhage) ([place]) 11/03/2021   Essential hypertension 09/05/2015   INSOMNIA, CHRONIC 03/01/2007   MIGRAINE HEADACHE 03/01/2007   Narcolepsy without cataplexy 03/01/2007    REFERRING DIAG: I61.0 (ICD-10-CM) - Nontraumatic subcortical hemorrhage of left cerebral hemisphere ([place])   THERAPY DIAG:  Hemiplegia and hemiparesis following nontraumatic intracerebral hemorrhage affecting right dominant side ([facility])  Unsteadiness on feet  Muscle weakness (generalized)  Other abnormalities of gait and mobility  Rationale for Evaluation and Treatment Rehabilitation  PERTINENT HISTORY: ADHD, migraines, HTN, hyperlipidemia   Presented 11/05/2021 with acute onset of right-sided weakness and aphasia.  Left CVA/ICH confirmed. Admitted to rehab on 11/08/2021 and discharged 11/29/2021.  PRECAUTIONS: Fall  SUBJECTIVE: No new complaints. No falls or pain to report. Has brace with  her, not on.   PAIN:  Are you having pain? No  VITALS: Before session (LUE): BP 132/83, HR 59 There were no vitals filed for this visit.     TODAY'S TREATMENT:   [facility] Adult PT Treatment/Exercise - 01/21/22 1122       Modalities   Modalities Electrical Stimulation      Electrical Stimulation   Electrical Stimulation Location right anterior tib    Electrical Stimulation Action increased muscle/nerver activation in both open and closed chain    Electrical Stimulation Parameters refert to tablet 1 for adjusted parameters; quick fit electrode    Electrical Stimulation Goals Tone;Neuromuscular facilitation           GAIT: Gait pattern: step through pattern, decreased step length- Right, decreased stance time- Left, and decreased stride length Distance walked: 115 x 2 reps Assistive device utilized: Quad cane small base and Bioness to right anterior tib Level of assistance: CGA Comments: cues for equal step length (decreased on right, increased on left), to relax right UE and for posture.     BALANCE/NMR: On balance board in anterior/posterior direction: assisted DF with on time of 5 sec's, rest in neutral for 8 sec's x 5 minutes Standing across red foam beam: right heel tap to floor with on time of 5 sec's, resting on beam for 8 sec's x 10 reps    PATIENT EDUCATION: Education details:  Continue HEP, plan to check goals at next session Person educated: Patient Education method: Explanation Education comprehension: verbalized understanding  HOME EXERCISE PROGRAM: Access Code: [facility] URL: [email or web address] Date: 12/30/2021 Prepared by: Elease Etienne  Exercises - Standing Terminal Knee Extension with Resistance  - 1 x daily - 7 x weekly - 3 sets - 10 reps - Forward Step Up with Counter Support  - 1 x daily - 7 x weekly - 3 sets - 10 reps - Standing Single Leg Stance with Counter Support  - 1 x daily - 7 x weekly - 3 sets - 10 reps - Squat  -  1 x daily - 7 x weekly - 3 sets - 10 reps - Side Stepping with Resistance at Thighs  - 1 x daily - 7 x weekly - 3 sets - 10 reps (green theraband) - Forward and Ba/ckward Monster Walk with Counter Support  - 1 x daily - 7 x weekly - 3 sets - 10 reps (green theraband) - Sit to Stand with Armchair (Wedge Under L Foot) - 1 x daily - 7 x weekly - 3 sets - 10 reps  GOALS: Goals reviewed with patient? Yes  SHORT TERM GOALS: Target date: 01/02/2022  Pt will be independent with strength and balance HEP with supervision from family as needed. Baseline:  01/02/22: met with current HEP Goal status: MET  2.  Pt will navigate 4 stairs w/o handrails reciprocally at supervision level to improve safety with functional mobility in her home. Baseline:  01/07/22: able to use reciprocal pattern, however continues to need rails.  Goal status: PARTIALLY MET  3.  Pt will ambulate >/=375 feet over indoor surfaces with LRAD and supervision level of assist to promote household and community access. Baseline: 01/02/22: 850 feet with RW today  Goal status: MET  4.  Pt will decrease 5xSTS to </=13 seconds in order to demonstrate decreased risk for falls and improved functional bilateral LE strength and power. Baseline: 01/02/22: 13.69 sec's no UE support Goal status: MET  5.  Pt will ambulate>/=300 feet on 2MWT to demonstrate improved endurance for functional tasks in home and community. Baseline:  01/02/22: 280 feet, improved by 45 feet, just not to goal Goal status: MET  6.  Pt will demonstrate a gait speed of >/=2.20 feet/sec in order to decrease risk for falls. Baseline: 01/02/22: 2.74 ft/sec with RW Goal status: MET  LONG TERM GOALS: Target date: 01/23/2022  Pt will increase FOTO score to >/=75% in order to demonstrate subjective functional improvement. Baseline: 68% Goal status: INITIAL  2.  Pt will navigate 16 stairs using no rails supervision level reciprocally to improve safe functional mobility in home  environment. Baseline: 4 step-to leaning against half wall/rail Goal status: INITIAL  3.  Pt will ambulate >/=750 feet with LRAD and ModI level of assist over various indoor and outdoor surfaces to promote household and community access. Baseline: 254' SBA RW Goal status: INITIAL  4.  Pt will demonstrate a gait speed of >/=2.4 feet/sec in order to decrease risk for falls. Baseline: 1.90 ft/sec Goal status: INITIAL  ASSESSMENT:  CLINICAL IMPRESSION: Today's skilled session focused on set up of Bioness to right anterior tib for use with gait/ex's for strengthening/balance with a good response noted. No issues noted or reported in session. Plan to check goals at next session.   OBJECTIVE IMPAIRMENTS Abnormal gait, decreased balance, decreased cognition, decreased endurance, and decreased strength.   ACTIVITY LIMITATIONS carrying, lifting, and locomotion level  PARTICIPATION LIMITATIONS: driving, community activity, occupation, and yard work  PERSONAL FACTORS Age, Fitness, Transportation, and 1 comorbidity: HTN are also affecting  patient's functional outcome.   REHAB POTENTIAL: Excellent  CLINICAL DECISION MAKING: Stable/uncomplicated  EVALUATION COMPLEXITY: Low  PLAN: PT FREQUENCY: 2x/week  PT DURATION: 6 weeks  PLANNED INTERVENTIONS: Therapeutic exercises, Therapeutic activity, Neuromuscular re-education, Balance training, Gait training, Patient/Family education, Self Care, Stair training, Vestibular training, DME instructions, Electrical stimulation, Biofeedback, and Re-evaluation  PLAN FOR NEXT SESSION:  Check LTGs for recert     Willow Ora, PTA, [facility] [address] [place], [place] [address] [phone] 01/21/22, 7:03 PM

## 2022-01-23 ENCOUNTER — Ambulatory Visit: Payer: BLUE CROSS/BLUE SHIELD | Admitting: Physical Therapy

## 2022-01-23 ENCOUNTER — Encounter: Payer: Self-pay | Admitting: Physical Therapy

## 2022-01-23 ENCOUNTER — Ambulatory Visit: Payer: BLUE CROSS/BLUE SHIELD | Admitting: Speech Pathology

## 2022-01-23 DIAGNOSIS — M6281 Muscle weakness (generalized): Secondary | ICD-10-CM

## 2022-01-23 DIAGNOSIS — R41842 Visuospatial deficit: Secondary | ICD-10-CM | POA: Diagnosis not present

## 2022-01-23 DIAGNOSIS — R2681 Unsteadiness on feet: Secondary | ICD-10-CM

## 2022-01-23 DIAGNOSIS — R41841 Cognitive communication deficit: Secondary | ICD-10-CM | POA: Diagnosis not present

## 2022-01-23 DIAGNOSIS — R2689 Other abnormalities of gait and mobility: Secondary | ICD-10-CM | POA: Diagnosis not present

## 2022-01-23 DIAGNOSIS — I69153 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: Secondary | ICD-10-CM | POA: Diagnosis not present

## 2022-01-23 DIAGNOSIS — I69151 Hemiplegia and hemiparesis following nontraumatic intracerebral hemorrhage affecting right dominant side: Secondary | ICD-10-CM | POA: Diagnosis not present

## 2022-01-23 DIAGNOSIS — R208 Other disturbances of skin sensation: Secondary | ICD-10-CM | POA: Diagnosis not present

## 2022-01-23 DIAGNOSIS — R4701 Aphasia: Secondary | ICD-10-CM | POA: Diagnosis not present

## 2022-01-23 DIAGNOSIS — R278 Other lack of coordination: Secondary | ICD-10-CM | POA: Diagnosis not present

## 2022-01-23 NOTE — Therapy (Signed)
OUTPATIENT PHYSICAL THERAPY TREATMENT NOTE   Patient Name: Denise Herrera MRN: [id number] DOB:07/13/1970, 50 y.o., female Today's Date: 01/23/2022  PCP: Antony Contras, MD REFERRING PROVIDER: Cathlyn Parsons, PA-C  END OF SESSION:   PT End of Session - 01/23/22 0938     Visit Number 13    Number of Visits 21   13+8   Date for PT Re-Evaluation 45/03/88   re-cert   Authorization Type [employer]    PT Start Time [phone]   pt late   PT Stop Time 1019    PT Time Calculation (min) 42 min    Activity Tolerance Patient tolerated treatment well    Behavior During Therapy WFL for tasks assessed/performed              Past Medical History:  Diagnosis Date   Chronic insomnia    History of migraine headaches    Narcolepsy without cataplexy(347.00)    Past Surgical History:  Procedure Laterality Date   CESAREAN SECTION     gyn cervical procedure     Patient Active Problem List   Diagnosis Date Noted   ICH (intracerebral hemorrhage) ([place]) 11/03/2021   Essential hypertension 09/05/2015   INSOMNIA, CHRONIC 03/01/2007   MIGRAINE HEADACHE 03/01/2007   Narcolepsy without cataplexy 03/01/2007    REFERRING DIAG: I61.0 (ICD-10-CM) - Nontraumatic subcortical hemorrhage of left cerebral hemisphere ([place])   THERAPY DIAG:  Hemiplegia and hemiparesis following nontraumatic intracerebral hemorrhage affecting right dominant side ([place]) - Plan: PT plan of care cert/re-cert  Unsteadiness on feet - Plan: PT plan of care cert/re-cert  Muscle weakness (generalized) - Plan: PT plan of care cert/re-cert  Other abnormalities of gait and mobility - Plan: PT plan of care cert/re-cert  Rationale for Evaluation and Treatment Rehabilitation  PERTINENT HISTORY: ADHD, migraines, HTN, hyperlipidemia   Presented 11/05/2021 with acute onset of right-sided weakness and aphasia.  Left CVA/ICH confirmed. Admitted to rehab on 11/08/2021 and discharged 11/29/2021.  PRECAUTIONS: Fall  SUBJECTIVE:  No new complaints. No falls or pain to report. Has brace with her, not on.   PAIN:  Are you having pain? No  VITALS: Before session (LUE): There were no vitals filed for this visit.     TODAY'S TREATMENT: -PT administered FOTO.  68% today.  GAIT: Gait pattern:  RLE ER, step through pattern, decreased arm swing- Right, genu recurvatum- Right, genu recurvatum- Left, and poor foot clearance- Right Distance walked: 500' (outside over grass and unlevel sidewalk) + 345' (inside level) Assistive device utilized: None Level of assistance: SBA and CGA Comments: Pt has some ER of the RLE compensating with adductors during swing phase.  She is able to correct this mildly with cuing.   -10MWT w/ NBQC:  12.19 sec = 0.82 m/sec OR 2.71 ft/sec -10MWT w/o AD:  10.90 sec = 0.92 m/sec OR 3.03 ft/sec   STAIRS:  Level of Assistance: SBA/S*  Stair Negotiation Technique: Step to Pattern with No Rails  Number of Stairs: 16   Height of Stairs: 6"  Comments: Able to complete w/o leaning on rail/side-wall, inc time to complete task.  No overt LOB.  PATIENT EDUCATION: Education details:  Continue HEP.  Process for re-cert and plan moving forward. Person educated: Patient Education method: Explanation Education comprehension: verbalized understanding   HOME EXERCISE PROGRAM: Access Code: [facility] URL: [email or web address] Date: 12/30/2021 Prepared by: Elease Etienne  Exercises - Standing Terminal Knee Extension with Resistance  - 1 x daily - 7 x weekly - 3 sets -  10 reps - Forward Step Up with Counter Support  - 1 x daily - 7 x weekly - 3 sets - 10 reps - Standing Single Leg Stance with Counter Support  - 1 x daily - 7 x weekly - 3 sets - 10 reps - Squat  - 1 x daily - 7 x weekly - 3 sets - 10 reps - Side Stepping with Resistance at Thighs  - 1 x daily - 7 x weekly - 3 sets - 10 reps (green theraband) - Forward and Ba/ckward Monster Walk with Counter Support  - 1 x daily - 7 x  weekly - 3 sets - 10 reps (green theraband) - Sit to Stand with Armchair (Wedge Under L Foot) - 1 x daily - 7 x weekly - 3 sets - 10 reps  OLD GOALS: Goals reviewed with patient? Yes  SHORT TERM GOALS: Target date: 01/02/2022  Pt will be independent with strength and balance HEP with supervision from family as needed. Baseline:  01/02/22: met with current HEP Goal status: MET  2.  Pt will navigate 4 stairs w/o handrails reciprocally at supervision level to improve safety with functional mobility in her home. Baseline:  01/07/22: able to use reciprocal pattern, however continues to need rails.  Goal status: PARTIALLY MET  3.  Pt will ambulate >/=375 feet over indoor surfaces with LRAD and supervision level of assist to promote household and community access. Baseline: 01/02/22: 850 feet with RW today  Goal status: MET  4.  Pt will decrease 5xSTS to </=13 seconds in order to demonstrate decreased risk for falls and improved functional bilateral LE strength and power. Baseline: 01/02/22: 13.69 sec's no UE support Goal status: MET  5.  Pt will ambulate>/=300 feet on 2MWT to demonstrate improved endurance for functional tasks in home and community. Baseline:  01/02/22: 280 feet, improved by 45 feet, just not to goal Goal status: MET  6.  Pt will demonstrate a gait speed of >/=2.20 feet/sec in order to decrease risk for falls. Baseline: 01/02/22: 2.74 ft/sec with RW Goal status: MET  LONG TERM GOALS: Target date: 01/23/2022  Pt will increase FOTO score to >/=75% in order to demonstrate subjective functional improvement. Baseline: 68%; 01/23/2022 68% Goal status: NOT MET  2.  Pt will navigate 16 stairs using no rails supervision level reciprocally to improve safe functional mobility in home environment. Baseline: 4 step-to leaning against half wall/rail; 16 step-to w/o leaning against rail S* Goal status: ONGOING  3.  Pt will ambulate >/=750 feet with LRAD and ModI level of assist over  various indoor and outdoor surfaces to promote household and community access. Baseline: 254' SBA RW; 9/8/ 2023 845' no AD SBA-CGA Goal status: ONGOING  4.  Pt will demonstrate a gait speed of >/=2.4 feet/sec in order to decrease risk for falls. Baseline: 1.90 ft/sec; 01/23/2022 3.03 ft/sec Goal status: MET  NEW GOALS: Goals reviewed with patient? Yes SHORT TERM GOALS=LONG TERM GOALS: Target date: 02/20/2022  Pt will increase FOTO score to >/=75% in order to demonstrate subjective functional improvement. Baseline: 68%; 01/23/2022 68% Goal status: ONGOING  2.  Pt will navigate 16 stairs using no rails supervision level reciprocally to improve safe functional mobility in home environment. Baseline: 4 step-to leaning against half wall/rail; 01/23/2022 16 step-to w/o leaning against rail S* Goal status: ONGOING  3.  Pt will ambulate >/=1000 feet with LRAD and ModI level of assist over various indoor and outdoor surfaces demonstrating improved right quad control and hip  flexion during swing phase to promote safe household and community access. Baseline: 254' SBA RW; 01/23/2022 845' no AD SBA-CGA Goal status: REVISED  4.  Pt will demonstrate a gait speed of >/=3.3 feet/sec in order to decrease risk for falls. Baseline: 1.90 ft/sec; 01/23/2022 3.03 ft/sec Goal status: REVISED  ASSESSMENT:  CLINICAL IMPRESSION: Assessed LTGs this session in preparation for re-cert today.  Pt made good progress towards goals partially meeting or meeting 3 of 4 goals.  Her FOTO score remains unchanged from evaluation at 68%.  She is better able to navigate 16 stairs using a step-to pattern without relying on UE support.  She ambulates over various indoor and outdoor surfaces at predominantly supervision level without use of AFO or AD maintaining dynamic stability.  Her gait speed is much improved both with and without her cane to just below normal limits at 3.03 ft/sec.  Will continue to address deficits related to quad  and RLE as able to progress ambulatory quality and safety with high level mobility.   OBJECTIVE IMPAIRMENTS Abnormal gait, decreased balance, decreased cognition, decreased endurance, and decreased strength.   ACTIVITY LIMITATIONS carrying, lifting, and locomotion level  PARTICIPATION LIMITATIONS: driving, community activity, occupation, and yard work  PERSONAL FACTORS Age, Fitness, Transportation, and 1 comorbidity: HTN are also affecting patient's functional outcome.   REHAB POTENTIAL: Excellent  CLINICAL DECISION MAKING: Stable/uncomplicated  EVALUATION COMPLEXITY: Low  PLAN: PT FREQUENCY: 2x/week  PT DURATION: 4 weeks  PLANNED INTERVENTIONS: Therapeutic exercises, Therapeutic activity, Neuromuscular re-education, Balance training, Gait training, Patient/Family education, Self Care, Stair training, Vestibular training, DME instructions, Electrical stimulation, Biofeedback, and Re-evaluation  PLAN FOR NEXT SESSION:  Delete old goals!  Continue with Bioness-please address knee hyperextension/quads; work on stair safety w/ bioness-trying to progress towards reciprocal; high level hip strengthening and balance, continue leg press, lifting progressions to overhead to prepare for return to work, treadmill training on incline   Elease Etienne, PT, DPT [facility] [address], [place], [place] [address] [phone] 01/23/22, 12:20 PM

## 2022-01-28 ENCOUNTER — Ambulatory Visit: Payer: BLUE CROSS/BLUE SHIELD | Admitting: Physical Therapy

## 2022-01-28 ENCOUNTER — Ambulatory Visit: Payer: BLUE CROSS/BLUE SHIELD | Admitting: Occupational Therapy

## 2022-01-29 ENCOUNTER — Encounter: Payer: Self-pay | Admitting: Physical Therapy

## 2022-01-29 ENCOUNTER — Ambulatory Visit: Payer: BLUE CROSS/BLUE SHIELD | Admitting: Occupational Therapy

## 2022-01-29 ENCOUNTER — Ambulatory Visit: Payer: BLUE CROSS/BLUE SHIELD | Admitting: Physical Therapy

## 2022-01-29 VITALS — BP 125/85 | HR 64

## 2022-01-29 DIAGNOSIS — R278 Other lack of coordination: Secondary | ICD-10-CM | POA: Diagnosis not present

## 2022-01-29 DIAGNOSIS — R2681 Unsteadiness on feet: Secondary | ICD-10-CM | POA: Diagnosis not present

## 2022-01-29 DIAGNOSIS — R2689 Other abnormalities of gait and mobility: Secondary | ICD-10-CM

## 2022-01-29 DIAGNOSIS — M6281 Muscle weakness (generalized): Secondary | ICD-10-CM | POA: Diagnosis not present

## 2022-01-29 DIAGNOSIS — R41842 Visuospatial deficit: Secondary | ICD-10-CM

## 2022-01-29 DIAGNOSIS — R41841 Cognitive communication deficit: Secondary | ICD-10-CM | POA: Diagnosis not present

## 2022-01-29 DIAGNOSIS — I69151 Hemiplegia and hemiparesis following nontraumatic intracerebral hemorrhage affecting right dominant side: Secondary | ICD-10-CM | POA: Diagnosis not present

## 2022-01-29 DIAGNOSIS — I69153 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: Secondary | ICD-10-CM

## 2022-01-29 DIAGNOSIS — I619 Nontraumatic intracerebral hemorrhage, unspecified: Secondary | ICD-10-CM | POA: Diagnosis not present

## 2022-01-29 DIAGNOSIS — R4701 Aphasia: Secondary | ICD-10-CM | POA: Diagnosis not present

## 2022-01-29 DIAGNOSIS — R208 Other disturbances of skin sensation: Secondary | ICD-10-CM | POA: Diagnosis not present

## 2022-01-29 NOTE — Therapy (Signed)
OUTPATIENT OCCUPATIONAL THERAPY NEURO Treatment  Patient Name: Denise Herrera MRN: [id number] DOB:06/27/1970, 51 y.o., female Today's Date: 01/29/2022  PCP: Dr. David Swayne REFERRING PROVIDER: Angiulli, Daniel J, PA-C    OT End of Session - 01/29/22 0932     Visit Number 7    Number of Visits 25    Date for OT Re-Evaluation 03/11/22    Authorization Type [employer], covered 100% (OOP met), visit limit--[place]    OT Start Time 0930    OT Stop Time 1015    OT Time Calculation (min) 45 min    Activity Tolerance Patient tolerated treatment well    Behavior During Therapy WFL for tasks assessed/performed                Past Medical History:  Diagnosis Date   Chronic insomnia    History of migraine headaches    Narcolepsy without cataplexy(347.00)    Past Surgical History:  Procedure Laterality Date   CESAREAN SECTION     gyn cervical procedure     Patient Active Problem List   Diagnosis Date Noted   ICH (intracerebral hemorrhage) (HCC) 11/03/2021   Essential hypertension 09/05/2015   INSOMNIA, CHRONIC 03/01/2007   MIGRAINE HEADACHE 03/01/2007   Narcolepsy without cataplexy 03/01/2007    ONSET DATE: 11/05/21  REFERRING DIAG: I61.0 (ICD-10-CM) - Nontraumatic subcortical hemorrhage of left cerebral hemisphere (HCC)   THERAPY DIAG:  Hemiplegia and hemiparesis following nontraumatic intracerebral hemorrhage affecting right dominant side (HCC)  Other lack of coordination  Visuospatial deficit  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 Blood pressure mildly elevated 141/85.  CT of the head showed acute parenchymal hemorrhage in the left corona radiata with intraventricular extension.  Hospital d/c 11/28/21.  PMH:  history of ADHD maintained on Ritalin, migraine headaches, hypertension, hyperlipidemia, hx of narcolepsy without cataplex, Hx of R RTC repair 03/2021.     PRECAUTIONS: Fall  WEIGHT BEARING RESTRICTIONS No  PAIN:  Are you having pain? No, only has minimal pain with higher range sh flex/abd Rt shoulder 1-4/10  FALLS: Has patient fallen in last 6 months? 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   Discussed job duties in prep for eventual return to work, and recommended only low level lifting from floor to waist, nothing mid to high level. Also discussed things to avoid at gym w/ UE's and ex's that are ok to perform at gym (arm bike, rows, triceps, sh ext, minimal bicep curls)  Mid level functional reaching (no weight) RUE for normal movement patterns and coordination Rt hand to place medium sized pegs in pegboard. Then removing pegs up to 3 for in hand manipulation w/ mod difficulty and mod drops Rt hand    Environmental Scanning  down quiet hallway with 15/15 accuracy = 100% on first pass.  Box & Blocks RUE = 40  HOME EXERCISE PROGRAM: 8/18/23coordination HEP 01/07/22: Shoulder HEP  01/21/22: Updated to HEP for proximal strengthening  GOALS: Potential Goals reviewed with patient? Yes  SHORT TERM GOALS: Target date: 01/23/22- extended due to schedule  Pt will be  independent with initial HEP. Goal status: MET  2.  Pt will improve dominant RUE coordination/functional reaching for ADLs as shown by improving score on box and blocks test by at least 8. Baseline: 34 blocks Goal status: IN PROGRESS (01/29/22: 40 blocks)  3.  Pt will use RUE as dominant UE for eating and grooming 100% of the time. Goal status: IN PROGRESS (Eating 100%, grooming 50%)   4.  Pt will perform simple home maintenance tasks with supervision. Goal status: MET  5.  Pt will perform environmental scanning/navigation with at least 95% accuracy in busy environment. Goal status: IN PROGRESS  6.  Pt will be able to write at least 2 sentences with good legibility in reasonable amount of time. Goal status: MET  LONG TERM GOALS: Target date: 03/11/22  Pt will be independent with updated HEP Goal status: IN PROGRESS  2.  Pt will improve R hand coordination for ADLs as shown by completing 9-hole peg test in less than 90sec. Baseline: only able to put in 1 peg within 2min Goal status: IN PROGRESS  3.  Pt will use RUE as dominant UE for eating and grooming at least 75% of the time. Goal status: INITIAL  4.  Pt will perform simple-mod complex home maintenance tasks mod I. Goal status: INITIAL  5.  Pt will improve RUE strength to be be able to retrieve 3lb object from overhead shelf safely x3. Goal status: INITIAL  6.  Pt will improve R grip strength to at least 30lbs to assist in lifting objects/opening containers. Goal status: INITIAL  7.  Pt will perform environmental scanning/navigation in busy environment with at least 95% accuracy for incr safety.   Goal status: INITIAL  ASSESSMENT:  CLINICAL IMPRESSION: Pt progressing towards STG's and has met 3/6 goals at this time. Pt w/ greater understanding of preventing sh compensations. Improvement on Box & Blocks test, however still limited in sensation Rt hand  PERFORMANCE DEFICITS in functional skills including ADLs, IADLs,  coordination, dexterity, sensation, ROM, strength, FMC, GMC, mobility, balance, decreased knowledge of precautions, decreased knowledge of use of DME, vision, and UE functional use, cognitive skills including memory, and psychosocial skills including habits.   IMPAIRMENTS are limiting patient from ADLs, IADLs, work, leisure, and social participation.   COMORBIDITIES may have co-morbidities  that affects occupational performance. Patient will benefit from skilled OT to address above impairments and improve overall function.  MODIFICATION OR ASSISTANCE TO COMPLETE EVALUATION: Min-Moderate modification of tasks or assist with assess necessary to complete an evaluation.  OT OCCUPATIONAL PROFILE AND HISTORY: Detailed assessment: Review of records and additional review of physical, cognitive, psychosocial history related to current functional performance.  CLINICAL DECISION MAKING: Moderate - several treatment options, min-mod task modification necessary  REHAB POTENTIAL: Good  EVALUATION COMPLEXITY: Moderate    PLAN: OT FREQUENCY: 2x/week  OT DURATION: 12 weeks +eval  PLANNED INTERVENTIONS: self care/ADL training, therapeutic exercise, therapeutic activity, neuromuscular re-education, manual therapy, passive range of motion, balance training, functional mobility training, aquatic therapy, splinting, electrical stimulation, ultrasound, paraffin, fluidotherapy, moist heat, cryotherapy, patient/family education, cognitive remediation/compensation, visual/perceptual remediation/compensation, energy conservation, and DME and/or AE instructions    RECOMMENDED OTHER SERVICES: none at this time  CONSULTED AND AGREED WITH PLAN OF CARE: Patient  PLAN FOR NEXT SESSION:  continue neuro re-ed RUE, fx'al reaching, in-hand manipulation, environmental scanning in busy gym w/ simple physical tasks   Kelly J Ballie, OTR/L 01/29/2022, 9:33 AM        

## 2022-01-29 NOTE — Therapy (Signed)
OUTPATIENT PHYSICAL THERAPY TREATMENT NOTE   Patient Name: Denise Herrera MRN: [id number] DOB:[birth year], 51 y.o., female Today's Date: 01/29/2022  PCP: Tally Joe, MD REFERRING PROVIDER: Charlton Amor, PA-C  END OF SESSION:   PT End of Session - 01/29/22 0852     Visit Number 14    Number of Visits 21   13+8   Date for PT Re-Evaluation 81/19/14   re-cert   Authorization Type [employer]    PT Start Time 0848    PT Stop Time 0930    PT Time Calculation (min) 42 min    Activity Tolerance Patient tolerated treatment well    Behavior During Therapy WFL for tasks assessed/performed              Past Medical History:  Diagnosis Date   Chronic insomnia    History of migraine headaches    Narcolepsy without cataplexy(347.00)    Past Surgical History:  Procedure Laterality Date   CESAREAN SECTION     gyn cervical procedure     Patient Active Problem List   Diagnosis Date Noted   ICH (intracerebral hemorrhage) (HCC) 11/03/2021   Essential hypertension 09/05/2015   INSOMNIA, CHRONIC 03/01/2007   MIGRAINE HEADACHE 03/01/2007   Narcolepsy without cataplexy 03/01/2007    REFERRING DIAG: I61.0 (ICD-10-CM) - Nontraumatic subcortical hemorrhage of left cerebral hemisphere (HCC)   THERAPY DIAG:  Hemiplegia and hemiparesis following nontraumatic intracerebral hemorrhage affecting right dominant side (HCC)  Unsteadiness on feet  Muscle weakness (generalized)  Other abnormalities of gait and mobility  Rationale for Evaluation and Treatment Rehabilitation  PERTINENT HISTORY: ADHD, migraines, HTN, hyperlipidemia   Presented 11/05/2021 with acute onset of right-sided weakness and aphasia.  Left CVA/ICH confirmed. Admitted to rehab on 11/08/2021 and discharged 11/29/2021.  PRECAUTIONS: Fall  SUBJECTIVE: No new complaints. No falls or pain to report.   PAIN:  Are you having pain? No  VITALS: Before session (LUE): Today's Vitals   01/29/22 0856  BP:  125/85  Pulse: 64    TODAY'S TREATMENT: 12# kettlebell squats 2x10, corrected form for symmetry and to promote improved RLE weight bearing 12# kettlebell deadlifts 2x8 10# crate lift floor <>mat x10 20# crate bimanual carry x115', corrected pickup form to prevent excess torsion on lower back and carry height to prevent excess strain on rotator cuff Single leg squat in // bars x10 using BUE support and SBA, pt has good trunk symmetry w/o lean Discussed progressing to decreased UE support for SLS activity at home Modified mountain climbers x20 w/ 2# right ankle weight; time spent adjusting angle for pt to be able to control RLE w/ hips forward in plank position, several rounds of holding plank position  PATIENT EDUCATION: Education details:  Continue HEP.  Safe lifting technique and focus on low weight and good form in the gym. Person educated: Patient Education method: Explanation Education comprehension: verbalized understanding   HOME EXERCISE PROGRAM: Access Code: [facility] URL: [email or web address] Date: 12/30/2021 Prepared by: Camille Bal  Exercises - Standing Terminal Knee Extension with Resistance  - 1 x daily - 7 x weekly - 3 sets - 10 reps - Forward Step Up with Counter Support  - 1 x daily - 7 x weekly - 3 sets - 10 reps - Standing Single Leg Stance with Counter Support  - 1 x daily - 7 x weekly - 3 sets - 10 reps - Squat  - 1 x daily - 7 x weekly - 3 sets - 10 reps -  Side Stepping with Resistance at Thighs  - 1 x daily - 7 x weekly - 3 sets - 10 reps (green theraband) - Forward and Ba/ckward Monster Walk with Counter Support  - 1 x daily - 7 x weekly - 3 sets - 10 reps (green theraband) - Sit to Stand with Armchair (Wedge Under L Foot) - 1 x daily - 7 x weekly - 3 sets - 10 reps  NEW GOALS: Goals reviewed with patient? Yes SHORT TERM GOALS=LONG TERM GOALS: Target date: 02/20/2022  Pt will increase FOTO score to >/=75% in order to demonstrate  subjective functional improvement. Baseline: 68%; 01/23/2022 68% Goal status: ONGOING  2.  Pt will navigate 16 stairs using no rails supervision level reciprocally to improve safe functional mobility in home environment. Baseline: 4 step-to leaning against half wall/rail; 01/23/2022 16 step-to w/o leaning against rail S* Goal status: ONGOING  3.  Pt will ambulate >/=1000 feet with LRAD and ModI level of assist over various indoor and outdoor surfaces demonstrating improved right quad control and hip flexion during swing phase to promote safe household and community access. Baseline: 254' SBA RW; 01/23/2022 845' no AD SBA-CGA Goal status: REVISED  4.  Pt will demonstrate a gait speed of >/=3.3 feet/sec in order to decrease risk for falls. Baseline: 1.90 ft/sec; 01/23/2022 3.03 ft/sec Goal status: REVISED  ASSESSMENT:  CLINICAL IMPRESSION: Entire skilled session focused on functional strengthening using lifting and squatting techniques.  Her RLE remains somewhat affected by ongoing hypertonicity, but her strength and motor control is slowly improving and is noted by knee flexion improvement in swing phase of gait and quad control in stance during ambulation around clinic w/o AD and AFO.  She continues to benefit from skilled PT to address lingering deficits in preparation for discharge to home management and safe return to work.   OBJECTIVE IMPAIRMENTS Abnormal gait, decreased balance, decreased cognition, decreased endurance, and decreased strength.   ACTIVITY LIMITATIONS carrying, lifting, and locomotion level  PARTICIPATION LIMITATIONS: driving, community activity, occupation, and yard work  PERSONAL FACTORS Age, Fitness, Transportation, and 1 comorbidity: HTN are also affecting patient's functional outcome.   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

## 2022-02-03 ENCOUNTER — Ambulatory Visit: Payer: BLUE CROSS/BLUE SHIELD | Admitting: Physical Therapy

## 2022-02-03 ENCOUNTER — Encounter: Payer: Self-pay | Admitting: Physical Therapy

## 2022-02-03 ENCOUNTER — Ambulatory Visit: Payer: BLUE CROSS/BLUE SHIELD | Admitting: Occupational Therapy

## 2022-02-03 VITALS — BP 124/71 | HR 60

## 2022-02-03 DIAGNOSIS — I69151 Hemiplegia and hemiparesis following nontraumatic intracerebral hemorrhage affecting right dominant side: Secondary | ICD-10-CM | POA: Diagnosis not present

## 2022-02-03 DIAGNOSIS — R41842 Visuospatial deficit: Secondary | ICD-10-CM | POA: Diagnosis not present

## 2022-02-03 DIAGNOSIS — M6281 Muscle weakness (generalized): Secondary | ICD-10-CM | POA: Diagnosis not present

## 2022-02-03 DIAGNOSIS — I69153 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: Secondary | ICD-10-CM | POA: Diagnosis not present

## 2022-02-03 DIAGNOSIS — R278 Other lack of coordination: Secondary | ICD-10-CM

## 2022-02-03 DIAGNOSIS — R2689 Other abnormalities of gait and mobility: Secondary | ICD-10-CM

## 2022-02-03 DIAGNOSIS — R208 Other disturbances of skin sensation: Secondary | ICD-10-CM | POA: Diagnosis not present

## 2022-02-03 DIAGNOSIS — R2681 Unsteadiness on feet: Secondary | ICD-10-CM

## 2022-02-03 DIAGNOSIS — R4701 Aphasia: Secondary | ICD-10-CM | POA: Diagnosis not present

## 2022-02-03 DIAGNOSIS — R41841 Cognitive communication deficit: Secondary | ICD-10-CM | POA: Diagnosis not present

## 2022-02-03 NOTE — Therapy (Signed)
OUTPATIENT OCCUPATIONAL THERAPY NEURO Treatment  Patient Name: Denise Herrera MRN: [id number] DOB:09/24/1970, 51 y.o., female Today's Date: 02/03/2022  PCP: Dr. Antony Contras REFERRING PROVIDER: Cathlyn Parsons, PA-C         Past Medical History:  Diagnosis Date   Chronic insomnia    History of migraine headaches    Narcolepsy without cataplexy(347.00)    Past Surgical History:  Procedure Laterality Date   CESAREAN SECTION     gyn cervical procedure     Patient Active Problem List   Diagnosis Date Noted   ICH (intracerebral hemorrhage) ([place]) 11/03/2021   Essential hypertension 09/05/2015   INSOMNIA, CHRONIC 03/01/2007   MIGRAINE HEADACHE 03/01/2007   Narcolepsy without cataplexy 03/01/2007    ONSET DATE: 11/05/21  REFERRING DIAG: I61.0 (ICD-10-CM) - Nontraumatic subcortical hemorrhage of left cerebral hemisphere ([place])   THERAPY DIAG:  No diagnosis found.  Rationale for Evaluation and Treatment Rehabilitation  SUBJECTIVE:   SUBJECTIVE STATEMENT: Pt reports the numbness in RUE is bothering her  PERTINENT HISTORY: 11/05/2021 with acute onset of right-sided weakness and aphasia. Blood pressure mildly elevated 141/85. CT of the head showed acute parenchymal hemorrhage in the left corona radiata with intraventricular extension.  Hospital d/c 11/28/21.  PMH:  history of ADHD maintained on Ritalin, migraine headaches, hypertension, hyperlipidemia, hx of narcolepsy without cataplex, Hx of R RTC repair 03/2021.     PRECAUTIONS: Fall  WEIGHT BEARING RESTRICTIONS No  PAIN:  Are you having pain? No, only has minimal pain with higher range sh flex/abd Rt shoulder 1-4/10  FALLS: Has patient fallen in last 6 months? 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 Typing test in prep for return to work 4 wpm and 79% accuracy Typing activities for letters(bubbles), initially both hands, then just right hand as pt was using primarily left, mod difficulty, v.c  Environmental scanning in a busy gym environment, 100% accuracy HOME EXERCISE PROGRAM: 8/18/23coordination HEP 01/07/22: Shoulder HEP  01/21/22: Updated to HEP for proximal strengthening  GOALS: Potential Goals reviewed with patient? Yes  SHORT TERM GOALS: Target date: 01/23/22- extended due to schedule  Pt will be independent with initial HEP. Goal status: MET  2.  Pt will improve dominant RUE coordination/functional reaching for ADLs as shown by improving score on box and blocks test by at least 8. Baseline: 34 blocks Goal status: IN PROGRESS (01/29/22: 40 blocks)  3.  Pt will use RUE as dominant UE for eating and grooming 100% of the time. Goal status: IN PROGRESS (Eating 100%, grooming 50%)   4.  Pt will perform simple home maintenance tasks with supervision. Goal status: MET  5.  Pt will perform environmental scanning/navigation with at least 95% accuracy in busy environment. Goal status: IN PROGRESS  6.  Pt will be able to write at least 2 sentences with good legibility in reasonable amount of time. Goal status: MET  LONG  TERM GOALS: Target date: 03/11/22  Pt will be independent with updated HEP Goal status: IN PROGRESS  2.  Pt will improve R hand coordination for ADLs as shown by completing 9-hole peg test in less than  90sec. Baseline: only able to put in 1 peg within 59mn Goal status: IN PROGRESS  3.  Pt will use RUE as dominant UE for eating and grooming at least 75% of the time. Goal status: INITIAL  4.  Pt will perform simple-mod complex home maintenance tasks mod I. Goal status: INITIAL  5.  Pt will improve RUE strength to be be able to retrieve 3lb object from overhead shelf safely x3. Goal status: INITIAL  6.  Pt will improve R grip strength to at least 30lbs to assist in lifting objects/opening containers. Goal status: INITIAL  7.  Pt will perform environmental scanning/navigation in busy environment with at least 95% accuracy for incr safety.   Goal status: INITIAL  ASSESSMENT:  CLINICAL IMPRESSION: Pt progressing towards goals. Her RUE remains limited due to sensory deficits. PERFORMANCE DEFICITS in functional skills including ADLs, IADLs, coordination, dexterity, sensation, ROM, strength, FMC, GMC, mobility, balance, decreased knowledge of precautions, decreased knowledge of use of DME, vision, and UE functional use, cognitive skills including memory, and psychosocial skills including habits.   IMPAIRMENTS are limiting patient from ADLs, IADLs, work, leisure, and social participation.   COMORBIDITIES may have co-morbidities  that affects occupational performance. Patient will benefit from skilled OT to address above impairments and improve overall function.  MODIFICATION OR ASSISTANCE TO COMPLETE EVALUATION: Min-Moderate modification of tasks or assist with assess necessary to complete an evaluation.  OT OCCUPATIONAL PROFILE AND HISTORY: Detailed assessment: Review of records and additional review of physical, cognitive, psychosocial history related to current functional performance.  CLINICAL DECISION MAKING: Moderate - several treatment options, min-mod task modification necessary  REHAB POTENTIAL: Good  EVALUATION COMPLEXITY: Moderate    PLAN: OT FREQUENCY: 2x/week  OT  DURATION: 12 weeks +eval  PLANNED INTERVENTIONS: self care/ADL training, therapeutic exercise, therapeutic activity, neuromuscular re-education, manual therapy, passive range of motion, balance training, functional mobility training, aquatic therapy, splinting, electrical stimulation, ultrasound, paraffin, fluidotherapy, moist heat, cryotherapy, patient/family education, cognitive remediation/compensation, visual/perceptual remediation/compensation, energy conservation, and DME and/or AE instructions  RECOMMENDED OTHER SERVICES: none at this time  CONSULTED AND AGREED WITH PLAN OF CARE: Patient  PLAN FOR NEXT SESSION:  continue neuro re-ed RUE, work on typing as pt will have to type if she returns to work, 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

## 2022-02-03 NOTE — Therapy (Signed)
OUTPATIENT PHYSICAL THERAPY TREATMENT NOTE   Patient Name: Denise Herrera MRN: [id number] DOB:11/11/1970, 51 y.o., female Today's Date: 02/03/2022  PCP: Tally Joe, MD REFERRING PROVIDER: Charlton Amor, PA-C  END OF SESSION:   PT End of Session - 02/03/22 0936     Visit Number 15    Number of Visits 21   13+8   Date for PT Re-Evaluation 79/89/21   re-cert   Authorization Type [employer]    PT Start Time 0930    PT Stop Time 1017    PT Time Calculation (min) 47 min    Activity Tolerance Patient tolerated treatment well    Behavior During Therapy WFL for tasks assessed/performed              Past Medical History:  Diagnosis Date   Chronic insomnia    History of migraine headaches    Narcolepsy without cataplexy(347.00)    Past Surgical History:  Procedure Laterality Date   CESAREAN SECTION     gyn cervical procedure     Patient Active Problem List   Diagnosis Date Noted   ICH (intracerebral hemorrhage) (HCC) 11/03/2021   Essential hypertension 09/05/2015   INSOMNIA, CHRONIC 03/01/2007   MIGRAINE HEADACHE 03/01/2007   Narcolepsy without cataplexy 03/01/2007    REFERRING DIAG: I61.0 (ICD-10-CM) - Nontraumatic subcortical hemorrhage of left cerebral hemisphere (HCC)   THERAPY DIAG:  Hemiplegia and hemiparesis following nontraumatic intracerebral hemorrhage affecting right dominant side (HCC)  Other lack of coordination  Unsteadiness on feet  Muscle weakness (generalized)  Other abnormalities of gait and mobility  Rationale for Evaluation and Treatment Rehabilitation  PERTINENT HISTORY: ADHD, migraines, HTN, hyperlipidemia   Presented 11/05/2021 with acute onset of right-sided weakness and aphasia.  Left CVA/ICH confirmed. Admitted to rehab on 11/08/2021 and discharged 11/29/2021.  PRECAUTIONS: Fall  SUBJECTIVE: No new complaints. No falls or pain to report.   PAIN:  Are you having pain? No- N/T in right arm/leg, but would not  consider this pain.  VITALS: Before session (LUE): Today's Vitals   02/03/22 0932  BP: 124/71  Pulse: 60    TODAY'S TREATMENT: Eye level cone placement and retrieval using RUE from waist height, PT provides facilitation to prevent upper trap compensation. Leg press bilaterally 12x90# > 2x12 at 110#  STAIRS:  Level of Assistance: SBA and CGA  Stair Negotiation Technique: Step to Pattern Alternating Pattern  Forwards with No Rails Single Rail on Left  Number of Stairs: 28   Height of Stairs: 6"  Comments: Last bout of stairs performed with use of rail to mimic left partial wall setup at home and for PT to provide edu on guarding for husband or son so pt can practice reciprocal steps at home.  First 6 rounds of stairs performed continuously for strengthening and motor planning of RLE as difficulty placing the foot noted due to difficulty with kinesthetic awareness in open chain.  Cues for intermittent visualization of feet and width of BOS to increase success of task and decrease hands-on guarding.  Treadmill training x8 mins progressing to 2% and 1. with right 2# ankle wt for kinesthetic input.  Pt cued for soft steps to encourage heel strike and inc knee and hip flexion during swing phase.  Pt able to maintain normal width BOS until fatigued with 1.5 minutes remaining in task, initiated cooldown at this point.   PATIENT EDUCATION: Education details:  Continue HEP.  Formal walking program. Person educated: Patient Education method: Explanation Education comprehension:  verbalized understanding   HOME EXERCISE PROGRAM: Access Code: [facility] URL: [email or web address] Date: 12/30/2021 Prepared by: Elease Etienne  Exercises - Standing Terminal Knee Extension with Resistance  - 1 x daily - 7 x weekly - 3 sets - 10 reps - Forward Step Up with Counter Support  - 1 x daily - 7 x weekly - 3 sets - 10 reps - Standing Single Leg Stance with Counter Support  - 1 x  daily - 7 x weekly - 3 sets - 10 reps - Squat  - 1 x daily - 7 x weekly - 3 sets - 10 reps - Side Stepping with Resistance at Thighs  - 1 x daily - 7 x weekly - 3 sets - 10 reps (green theraband) - Forward and Ba/ckward Monster Walk with Counter Support  - 1 x daily - 7 x weekly - 3 sets - 10 reps (green theraband) - Sit to Stand with Armchair (Wedge Under L Foot) - 1 x daily - 7 x weekly - 3 sets - 10 reps  You Can Walk For A Certain Length Of Time Each Day                          Walk 10 minutes 2-3 times per day.             Increase 3-5  minutes every 7 days              Work up to 30 minutes (1-2 times per day).               Example:                         Day 1-2           4-5 minutes     3 times per day                         Day 7-8           10-12 minutes 2-3 times per day                         Day 13-14       20-22 minutes 1-2 times per day  NEW GOALS: Goals reviewed with patient? Yes SHORT TERM GOALS=LONG TERM GOALS: Target date: 02/20/2022  Pt will increase FOTO score to >/=75% in order to demonstrate subjective functional improvement. Baseline: 68%; 01/23/2022 68% Goal status: ONGOING  2.  Pt will navigate 16 stairs using no rails supervision level reciprocally to improve safe functional mobility in home environment. Baseline: 4 step-to leaning against half wall/rail; 01/23/2022 16 step-to w/o leaning against rail S* Goal status: ONGOING  3.  Pt will ambulate >/=1000 feet with LRAD and ModI level of assist over various indoor and outdoor surfaces demonstrating improved right quad control and hip flexion during swing phase to promote safe household and community access. Baseline: 254' SBA RW; 01/23/2022 845' no AD SBA-CGA Goal status: REVISED  4.  Pt will demonstrate a gait speed of >/=3.3 feet/sec in order to decrease risk for falls. Baseline: 1.90 ft/sec; 01/23/2022 3.03 ft/sec Goal status: REVISED  ASSESSMENT:  CLINICAL IMPRESSION: Pt seen for skilled PT session  today focusing on continued improvement of motor control of right LE and UE with compensation during open chain tasks.  She continues to struggle with upper  trap compensation during overhead tasks and motor planning during last 10% of LE placement on stairs.  She continues to benefit from skilled PT to address deficits as noted and progress towards LTGs.   OBJECTIVE IMPAIRMENTS Abnormal gait, decreased balance, decreased cognition, decreased endurance, and decreased strength.   ACTIVITY LIMITATIONS carrying, lifting, and locomotion level  PARTICIPATION LIMITATIONS: driving, community activity, occupation, and yard work  PERSONAL FACTORS Age, Fitness, Transportation, and 1 comorbidity: HTN are also affecting patient's functional outcome.   REHAB POTENTIAL: Excellent  CLINICAL DECISION MAKING: Stable/uncomplicated  EVALUATION COMPLEXITY: Low  PLAN: PT FREQUENCY: 2x/week  PT DURATION: 4 weeks  PLANNED INTERVENTIONS: Therapeutic exercises, Therapeutic activity, Neuromuscular re-education, Balance training, Gait training, Patient/Family education, Self Care, Stair training, Vestibular training, DME instructions, Electrical stimulation, Biofeedback, and Re-evaluation  PLAN FOR NEXT SESSION:  work on stair safety-trying to progress towards reciprocal; high level hip strengthening and balance, continue leg press, lifting progressions to overhead to prepare for return to work, treadmill training on incline-ankle wts (R primarily), practice getting on and off ladder (job requirement)   Camille Bal, PT, DPT Outpatient Neuro [facility] [address] [place], [place] [address] [phone] 02/03/22, 1:20 PM

## 2022-02-05 ENCOUNTER — Ambulatory Visit: Payer: BLUE CROSS/BLUE SHIELD | Admitting: Physical Therapy

## 2022-02-05 ENCOUNTER — Encounter: Payer: Self-pay | Admitting: Physical Therapy

## 2022-02-05 ENCOUNTER — Encounter: Payer: Self-pay | Admitting: Occupational Therapy

## 2022-02-05 ENCOUNTER — Ambulatory Visit: Payer: BLUE CROSS/BLUE SHIELD | Admitting: Occupational Therapy

## 2022-02-05 VITALS — BP 136/97 | HR 77

## 2022-02-05 DIAGNOSIS — R2681 Unsteadiness on feet: Secondary | ICD-10-CM

## 2022-02-05 DIAGNOSIS — M6281 Muscle weakness (generalized): Secondary | ICD-10-CM | POA: Diagnosis not present

## 2022-02-05 DIAGNOSIS — R41842 Visuospatial deficit: Secondary | ICD-10-CM | POA: Diagnosis not present

## 2022-02-05 DIAGNOSIS — R41841 Cognitive communication deficit: Secondary | ICD-10-CM | POA: Diagnosis not present

## 2022-02-05 DIAGNOSIS — I69151 Hemiplegia and hemiparesis following nontraumatic intracerebral hemorrhage affecting right dominant side: Secondary | ICD-10-CM

## 2022-02-05 DIAGNOSIS — R2689 Other abnormalities of gait and mobility: Secondary | ICD-10-CM

## 2022-02-05 DIAGNOSIS — R278 Other lack of coordination: Secondary | ICD-10-CM | POA: Diagnosis not present

## 2022-02-05 DIAGNOSIS — I69153 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: Secondary | ICD-10-CM | POA: Diagnosis not present

## 2022-02-05 DIAGNOSIS — R4701 Aphasia: Secondary | ICD-10-CM | POA: Diagnosis not present

## 2022-02-05 DIAGNOSIS — R208 Other disturbances of skin sensation: Secondary | ICD-10-CM | POA: Diagnosis not present

## 2022-02-05 NOTE — Therapy (Addendum)
OUTPATIENT OCCUPATIONAL THERAPY NEURO Treatment  Patient Name: Denise Herrera MRN: [id number] DOB:[birth year], 51 y.o., female Today's Date: 02/05/2022  PCP: Dr. Antony Contras REFERRING PROVIDER: Cathlyn Parsons, PA-C    OT End of Session - 02/05/22 0817     Visit Number 9    Number of Visits 25    Date for OT Re-Evaluation 03/11/22    Authorization Type [employer], covered 100% (OOP met), visit limit--MN    OT Start Time 0800    OT Stop Time 0845    OT Time Calculation (min) 45 min    Activity Tolerance Patient tolerated treatment well    Behavior During Therapy WFL for tasks assessed/performed                 Past Medical History:  Diagnosis Date   Chronic insomnia    History of migraine headaches    Narcolepsy without cataplexy(347.00)    Past Surgical History:  Procedure Laterality Date   CESAREAN SECTION     gyn cervical procedure     Patient Active Problem List   Diagnosis Date Noted   ICH (intracerebral hemorrhage) ([place]) 11/03/2021   Essential hypertension 09/05/2015   INSOMNIA, CHRONIC 03/01/2007   MIGRAINE HEADACHE 03/01/2007   Narcolepsy without cataplexy 03/01/2007    ONSET DATE: 11/05/21  REFERRING DIAG: I61.0 (ICD-10-CM) - Nontraumatic subcortical hemorrhage of left cerebral hemisphere ([place])   THERAPY DIAG:  Hemiplegia and hemiparesis following nontraumatic intracerebral hemorrhage affecting right dominant side ([place])  Other lack of coordination  Unsteadiness on feet  Rationale for Evaluation and Treatment Rehabilitation  SUBJECTIVE:   SUBJECTIVE STATEMENT: Pt reports the numbness in RUE is bothering her  PERTINENT HISTORY: 11/05/2021 with acute onset of right-sided weakness and aphasia. Blood pressure mildly elevated 141/85. CT of the head showed acute parenchymal hemorrhage in the left corona radiata with intraventricular extension.  Hospital d/c 11/28/21.  PMH:  history of ADHD maintained on Ritalin, migraine headaches, hypertension,  hyperlipidemia, hx of narcolepsy without cataplex, Hx of R RTC repair 03/2021.     PRECAUTIONS: Fall  WEIGHT BEARING RESTRICTIONS No  PAIN:  Are you having pain? No, only has minimal pain with higher range sh flex/abd Rt shoulder 1-4/10  FALLS: Has patient fallen in last 6 months? 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 Pt issued Rt handed typing words  In hand manipulation: manipulating stress balls, turning block around in numerical order, manipulating coins up to 5 at a time then stacking (3 stacks of 5)   High level closed chain sh flexion RUE using UE Ranger on wall.  BUE sh flexion rolling ball along wall  UBE x 8 mintues, level 3 for UE strength/endurance and normal reciprocal movement pattern    HOME EXERCISE PROGRAM: 8/18/23coordination HEP 01/07/22: Shoulder HEP  01/21/22: Updated to HEP for proximal strengthening   GOALS: Potential Goals reviewed with patient? Yes  SHORT TERM GOALS: Target date: 01/23/22- extended due to schedule  Pt will be independent with initial HEP. Goal status: MET  2.  Pt will improve dominant RUE coordination/functional reaching for ADLs as shown by improving score on box and  blocks test by at least 8. Baseline: 34 blocks Goal status: IN PROGRESS (01/29/22: 40 blocks)  3.  Pt will use RUE as dominant UE for eating and grooming 100% of the time. Goal status: IN PROGRESS (Eating 100%, grooming 50%)   4.  Pt will perform simple home maintenance tasks with supervision. Goal status: MET  5.  Pt will perform environmental scanning/navigation  with at least 95% accuracy in busy environment. Goal status: IN PROGRESS  6.  Pt will be able to write at least 2 sentences with good legibility in reasonable amount of time. Goal status: MET  LONG TERM GOALS: Target date: 03/11/22  Pt will be independent with updated HEP Goal status: IN PROGRESS  2.  Pt will improve R hand coordination for ADLs as shown by completing 9-hole peg test in less than 90sec. Baseline: only able to put in 1 peg within 41mn Goal status: IN PROGRESS  3.  Pt will use RUE as dominant UE for eating and grooming at least 75% of the time. Goal status: INITIAL  4.  Pt will perform simple-mod complex home maintenance tasks mod I. Goal status: INITIAL  5.  Pt will improve RUE strength to be be able to retrieve 3lb object from overhead shelf safely x3. Goal status: INITIAL  6.  Pt will improve R grip strength to at least 30lbs to assist in lifting objects/opening containers. Goal status: INITIAL  7.  Pt will perform environmental scanning/navigation in busy environment with at least 95% accuracy for incr safety.   Goal status: INITIAL  ASSESSMENT:  CLINICAL IMPRESSION: Pt progressing towards goals. Her RUE remains limited due to sensory deficits.  PERFORMANCE DEFICITS in functional skills including ADLs, IADLs, coordination, dexterity, sensation, ROM, strength, FMC, GMC, mobility, balance, decreased knowledge of precautions, decreased knowledge of use of DME, vision, and UE functional use, cognitive skills including memory, and psychosocial skills including habits.   IMPAIRMENTS are limiting patient from ADLs, IADLs, work, leisure, and social participation.   COMORBIDITIES may have co-morbidities  that affects occupational performance. Patient will benefit from skilled OT to address above impairments and improve overall function.  MODIFICATION OR ASSISTANCE TO COMPLETE EVALUATION: Min-Moderate modification of tasks or assist with assess necessary to complete  an evaluation.  OT OCCUPATIONAL PROFILE AND HISTORY: Detailed assessment: Review of records and additional review of physical, cognitive, psychosocial history related to current functional performance.  CLINICAL DECISION MAKING: Moderate - several treatment options, min-mod task modification necessary  REHAB POTENTIAL: Good  EVALUATION COMPLEXITY: Moderate    PLAN: OT FREQUENCY: 2x/week  OT DURATION: 12 weeks +eval  PLANNED INTERVENTIONS: self care/ADL training, therapeutic exercise, therapeutic activity, neuromuscular re-education, manual therapy, passive range of motion, balance training, functional mobility training, aquatic therapy, splinting, electrical stimulation, ultrasound, paraffin, fluidotherapy, moist heat, cryotherapy, patient/family education, cognitive remediation/compensation, visual/perceptual remediation/compensation, energy conservation, and DME and/or AE instructions  RECOMMENDED OTHER SERVICES: none at this time  CONSULTED AND AGREED WITH PLAN OF CARE: Patient  PLAN FOR NEXT SESSION:  continue neuro re-ed RUE, in hand manipulation, functional reaching RUE, taping upper traps   KHans Eden OTR/L 02/05/2022, 8:17 AM

## 2022-02-05 NOTE — Therapy (Signed)
OUTPATIENT PHYSICAL THERAPY TREATMENT NOTE   Patient Name: Denise Herrera MRN: [id number] DOB:05-17-1971, 51 y.o., female Today's Date: 02/05/2022  PCP: Antony Contras, MD REFERRING PROVIDER: Cathlyn Parsons, PA-C  END OF SESSION:   PT End of Session - 02/05/22 0852     Visit Number 16    Number of Visits 21   13+8   Date for PT Re-Evaluation 40/98/11   re-cert   Authorization Type [employer]    PT Start Time 0850   running late from OT session   PT Stop Time 0930    PT Time Calculation (min) 40 min    Equipment Utilized During Treatment Gait belt    Activity Tolerance Patient tolerated treatment well    Behavior During Therapy WFL for tasks assessed/performed              Past Medical History:  Diagnosis Date   Chronic insomnia    History of migraine headaches    Narcolepsy without cataplexy(347.00)    Past Surgical History:  Procedure Laterality Date   CESAREAN SECTION     gyn cervical procedure     Patient Active Problem List   Diagnosis Date Noted   ICH (intracerebral hemorrhage) ([place]) 11/03/2021   Essential hypertension 09/05/2015   INSOMNIA, CHRONIC 03/01/2007   MIGRAINE HEADACHE 03/01/2007   Narcolepsy without cataplexy 03/01/2007    REFERRING DIAG: I61.0 (ICD-10-CM) - Nontraumatic subcortical hemorrhage of left cerebral hemisphere ([place])   THERAPY DIAG:  Hemiplegia and hemiparesis following nontraumatic intracerebral hemorrhage affecting right dominant side (HCC)  Unsteadiness on feet  Muscle weakness (generalized)  Other abnormalities of gait and mobility  Rationale for Evaluation and Treatment Rehabilitation  PERTINENT HISTORY: ADHD, migraines, HTN, hyperlipidemia   Presented 11/05/2021 with acute onset of right-sided weakness and aphasia.  Left CVA/ICH confirmed. Admitted to rehab on 11/08/2021 and discharged 11/29/2021.  PRECAUTIONS: Fall  SUBJECTIVE: No new complaints. No falls/pain to report.   PAIN:  Are you having  pain? No- N/T in right arm/leg, but would not consider this pain.  VITALS: Before session (LUE): Today's Vitals   02/05/22 0856  BP: (!) 136/97  Pulse: 77     TODAY'S TREATMENT: STRENGTHENING   Treadmill x 10 minutes as follows: with 2# weight to right ankle for increased kinesthetic/proprioceptive input no incline x 2 minutes> 1% incline x 2 minutes> 2% incline x 2 minutes> 1% incline x 2 minutes> no incline x 2 minutes with SBA. Cues needed for increased right hip/knee flexion and heel strike with toe scuffing noted occasionally through out. No balance issues with bil UE support on rails.   BP after TM 129/80, HR 82    BALANCE/NMR: On red mat next to mat table: lunges to floor<>back up in wide staggered stance for 10 reps each side with single UE support on mat table for balance.   Next to counter top: in staggered stance with right foot back with green band resistance- forward heel taps to 6 inch box for 2 sets of 10 reps with assist/facilitation at pelvis for alignment/rotation/movement with forward progression of right LE. Cues for increased right hip/knee flexion against the resistance.   Standing on balance board in anterior/posterior direction: alternating heel taps forward to floor<>back onto board for 10 reps each side with intermittent UE support with right stance only, no support with needed with left stance. CGA for safety.      PATIENT EDUCATION: Education details:  Continue HEP.   Person educated: Patient Education method:  Explanation Education comprehension: verbalized understanding   HOME EXERCISE PROGRAM: Access Code: [facility] URL: [email or web address] Date: 12/30/2021 Prepared by: Camille Bal  Exercises - Standing Terminal Knee Extension with Resistance  - 1 x daily - 7 x weekly - 3 sets - 10 reps - Forward Step Up with Counter Support  - 1 x daily - 7 x weekly - 3 sets - 10 reps - Standing Single Leg Stance with Counter Support  -  1 x daily - 7 x weekly - 3 sets - 10 reps - Squat  - 1 x daily - 7 x weekly - 3 sets - 10 reps - Side Stepping with Resistance at Thighs  - 1 x daily - 7 x weekly - 3 sets - 10 reps (green theraband) - Forward and Ba/ckward Monster Walk with Counter Support  - 1 x daily - 7 x weekly - 3 sets - 10 reps (green theraband) - Sit to Stand with Armchair (Wedge Under L Foot) - 1 x daily - 7 x weekly - 3 sets - 10 reps  You Can Walk For A Certain Length Of Time Each Day                          Walk 10 minutes 2-3 times per day.             Increase 3-5  minutes every 7 days              Work up to 30 minutes (1-2 times per day).               Example:                         Day 1-2           4-5 minutes     3 times per day                         Day 7-8           10-12 minutes 2-3 times per day                         Day 13-14       20-22 minutes 1-2 times per day  NEW GOALS: Goals reviewed with patient? Yes SHORT TERM GOALS=LONG TERM GOALS: Target date: 02/20/2022  Pt will increase FOTO score to >/=75% in order to demonstrate subjective functional improvement. Baseline: 68%; 01/23/2022 68% Goal status: ONGOING  2.  Pt will navigate 16 stairs using no rails supervision level reciprocally to improve safe functional mobility in home environment. Baseline: 4 step-to leaning against half wall/rail; 01/23/2022 16 step-to w/o leaning against rail S* Goal status: ONGOING  3.  Pt will ambulate >/=1000 feet with LRAD and ModI level of assist over various indoor and outdoor surfaces demonstrating improved right quad control and hip flexion during swing phase to promote safe household and community access. Baseline: 254' SBA RW; 01/23/2022 845' no AD SBA-CGA Goal status: REVISED  4.  Pt will demonstrate a gait speed of >/=3.3 feet/sec in order to decrease risk for falls. Baseline: 1.90 ft/sec; 01/23/2022 3.03 ft/sec Goal status: REVISED  ASSESSMENT:  CLINICAL IMPRESSION: Today's skilled session  continued to focus on gait training, activity tolerance and strengthening with no issues noted or reported in session. The pt is making steady progress and should  benefit from continued PT to progress toward unmet goals.    OBJECTIVE IMPAIRMENTS Abnormal gait, decreased balance, decreased cognition, decreased endurance, and decreased strength.   ACTIVITY LIMITATIONS carrying, lifting, and locomotion level  PARTICIPATION LIMITATIONS: driving, community activity, occupation, and yard work  PERSONAL FACTORS Age, Fitness, Transportation, and 1 comorbidity: HTN are also affecting patient's functional outcome.   REHAB POTENTIAL: Excellent  CLINICAL DECISION MAKING: Stable/uncomplicated  EVALUATION COMPLEXITY: Low  PLAN: PT FREQUENCY: 2x/week  PT DURATION: 4 weeks  PLANNED INTERVENTIONS: Therapeutic exercises, Therapeutic activity, Neuromuscular re-education, Balance training, Gait training, Patient/Family education, Self Care, Stair training, Vestibular training, DME instructions, Electrical stimulation, Biofeedback, and Re-evaluation  PLAN FOR NEXT SESSION:  work on stair safety-trying to progress towards reciprocal; high level hip strengthening and balance, continue leg press, lifting progressions to overhead to prepare for return to work, treadmill training on incline-ankle wts (R primarily), practice getting on and off ladder (job requirement)    Sallyanne Kuster, PTA, [facility] Outpatient Neuro [facility] [address] [place], [place] [address] [phone] 02/05/22, 10:10 AM

## 2022-02-05 NOTE — Progress Notes (Signed)
[facility] [address] [place]. [place] [address] [phone]       HOSPITAL FOLLOW UP NOTE  [name] Date of Birth:  03-29-1971 Medical Record Number:  [id number]   Reason for Referral:  hospital stroke follow up    SUBJECTIVE:   CHIEF COMPLAINT:  Chief Complaint  Patient presents with   Follow-up stroke    Pt reports feeling fine. She is asking what is the likelihood of having another stroke. She reports still having numbness in right leg and hand. Room 2 alone    HPI:   [name] is a 51 y.o. female with history of hypertension, hyperlipidemia and narcolepsy who presented on 11/03/2021 with right-sided numbness, weakness and aphasia.  Personally reviewed hospitalization pertinent progress notes, lab work and imaging.  Stroke work-up revealed left BG/CR ICH with small IVH likely due to HTN.  CTA head/neck negative for aneurysm, AVM or other significant vascular abnormality.  EF 65 to 70%.  LDL 142.  A1c 5.3.  Hypertensive emergency initially requiring Cleviprex eventually placed on Avapro 160 mg and Bystolic 5 mg (Micardis and Bystolic PTA).  Continued atorvastatin 20 mg daily HLD management.  She was discharged to [facility] for ongoing therapy needs.   Today, 02/09/2022, patient is being seen for initial hospital follow-up unaccompanied.  Reports continued right-sided weakness, right foot>hand numbness and walking impairment. Has been making gradual recovery, working with PT/OT.  Denies any speech or cognitive issues.  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  Denies new stroke/TIA symptoms.  Blood pressure well controlled, today 134/86, has been tolerating new meds without side effects Plans on repeating lipids with PCP October  Closely followed by PCP and PMR     PERTINENT IMAGING  Per hospitalization  11/03/2021 6/19-CT head- Acute parenchymal hemorrhage in the left corona radiata and thalamocapsular region with intraventricular extension. No significant mass effect or hydrocephalus. 6/19- CTA head & neck - No aneurysm, AVM, or other significant vascular abnormality. 6/20- Repeat CTH-Persistent left parenchymal hemorrhage as described which has increased in greatest dimension on coronal imaging from 3.2-3.8 cm. 6/20- MRI brain- Acute parenchymal hemorrhage with involvement of the left thalamus and adjacent white matter with intraventricular extension. Mild edema and mass effect.  2D Echo- EF 65-70% and no shunt LDL 142 HgbA1c 5.3    ROS:   14 system review of systems performed and negative with exception of those listed in HPI  PMH:  Past Medical History:  Diagnosis Date   Chronic insomnia    History of migraine headaches    Narcolepsy without cataplexy(347.00)     PSH:  Past Surgical History:  Procedure Laterality Date   CESAREAN SECTION     gyn cervical procedure      Social History:  Social History   Socioeconomic History   Marital status: Married    Spouse name: Not on file   Number of children: 3   Years of education: Not on file   Highest education level: Not on file  Occupational History   Occupation: makes medical hose and prostheses  Tobacco Use   Smoking status: Never   Smokeless tobacco: Never  Substance and Sexual Activity   Alcohol use: No    Alcohol/week: 0.0 standard drinks of alcohol   Drug use: No   Sexual activity: Not on file  Other Topics Concern  Not on file  Social History Narrative   Not on file   Social Determinants of Health   Financial Resource Strain: Not on file  Food Insecurity: Not on file  Transportation Needs: Not on file  Physical Activity: Not on file  Stress: Not on file  Social Connections: Not on file  Intimate Partner Violence: Not on file    Family History:  Family History  Problem Relation Age of Onset    Asthma Mother    Hypertension Mother    Cancer Mother        breast cancer   Hypertension Maternal Grandfather    Diabetes Maternal Grandfather    Hypertension Maternal Grandmother     Medications:   Current Outpatient Medications on File Prior to Visit  Medication Sig Dispense Refill   acetaminophen (TYLENOL) 325 MG tablet Take 2 tablets (650 mg total) by mouth every 4 (four) hours as needed for mild pain (or temp > 37.5 C (99.5 F)).     atorvastatin (LIPITOR) 20 MG tablet Take 1 tablet (20 mg total) by mouth daily. 30 tablet 0   cetirizine (ZYRTEC) 10 MG tablet Take 10 mg by mouth daily.     chlorthalidone (HYGROTON) 25 MG tablet Take 25 mg by mouth daily.     irbesartan (AVAPRO) 300 MG tablet Take 1 tablet (300 mg total) by mouth daily. 30 tablet 0   methylphenidate (METADATE ER) 20 MG ER tablet Twice daily as needed (Patient taking differently: Take 20 mg by mouth 2 (two) times daily as needed (ADHD).) 60 tablet 0   methylphenidate (RITALIN) 10 MG tablet Take 10 mg by mouth 3 (three) times daily.     nebivolol (BYSTOLIC) 5 MG tablet Take 1 tablet (5 mg total) by mouth daily. 30 tablet 0   pantoprazole (PROTONIX) 40 MG tablet Take 1 tablet (40 mg total) by mouth daily. 30 tablet 0   triamcinolone (NASACORT) 55 MCG/ACT AERO nasal inhaler Place 2 sprays into the nose daily as needed (allergies).     zolpidem (AMBIEN CR) 12.5 MG CR tablet Take 12.5 mg by mouth at bedtime as needed.     RELPAX 40 MG tablet Take 40 mg by mouth daily as needed for migraine. (Patient not taking: Reported on 02/09/2022)  1   senna-docusate (SENOKOT-S) 8.6-50 MG tablet Take 1 tablet by mouth 2 (two) times daily. (Patient not taking: Reported on 12/24/2021)     No current facility-administered medications on file prior to visit.    Allergies:   Allergies  Allergen Reactions   Amlodipine Besylate     Dizziness, disequilibrium, fatigue Other reaction(s): Dizziness, disequilibrium and fatigue    Hydrochlorothiazide Other (See Comments)    Fatigue Laziness  Other reaction(s): fatigue/laziness   Influenza Vaccines     Other reaction(s): nausea and vomiting x 4-5 days   Lodine [Etodolac] Itching   Meloxicam     Other reaction(s): legs swell   Norvasc [Amlodipine] Other (See Comments)    Dizziness Disequilibrium Fatigue   Nuvigil [Armodafinil] Other (See Comments)    Headache    Vicodin [Hydrocodone-Acetaminophen] Itching   Lipitor [Atorvastatin] Other (See Comments)    Fatigue       OBJECTIVE:  Physical Exam  Vitals:   02/09/22 0913  BP: 134/86  Pulse: 87  Weight: 172 lb 4 oz (78.1 kg)  Height: 5\' 4"  (1.626 m)   Body mass index is 29.57 kg/m. No results found.  Poststroke PHQ 2/9    02/09/2022   10:01 AM  Depression screen PHQ 2/9  Decreased Interest 0  Down, Depressed, Hopeless 0  PHQ - 2 Score 0     General: well developed, well nourished, very pleasant middle-aged African-American female, seated, in no evident distress Head: head normocephalic and atraumatic.   Neck: supple with no carotid or supraclavicular bruits Cardiovascular: regular rate and rhythm, no murmurs Musculoskeletal: no deformity Skin:  no rash/petichiae Vascular:  Normal pulses all extremities   Neurologic Exam Mental Status: Awake and fully alert.  Fluent speech and language.  Oriented to place and time. Recent and remote memory intact. Attention span, concentration and fund of knowledge appropriate. Mood and affect appropriate.  Cranial Nerves: Fundoscopic exam reveals sharp disc margins. Pupils equal, briskly reactive to light. Extraocular movements full without nystagmus. Visual fields full to confrontation. Hearing intact. Facial sensation intact. Face, tongue, palate moves normally and symmetrically.  Motor: Normal strength in all tested extremity muscles except mild right ADF weakness with increased tone right upper and lower extremity, normal on left Sensory.:  Decrease  sensation right upper and lower extremity.  Coordination: Rapid alternating movements normal in all extremities. Finger-to-nose and heel-to-shin performed accurately left side, impaired right side likely from increased tone. Gait and Station: Arises from chair without difficulty. Stance is normal. Gait demonstrates hemiplegic gait with mild imbalance without use of assistive device.  Unable to perform tandem walking heel toe. Reflexes: 3+ RUE and RLE, 2+ LUE and LLE. Toes downgoing.     NIHSS  3 Modified Rankin  2      ASSESSMENT: NOVIE MAGGIO is a 51 y.o. year old female with left BG/CR ICH with small IVH 11/03/2021 likely due to hypertension. Vascular risk factors include HTN, and HLD.      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  Continue working with PT/OT for hopeful ongoing recovery.  Continue to follow with PMR as scheduled, PMR assisting with disability Continue atorvastatin (Lipitor) for secondary stroke prevention.   Discussed secondary stroke prevention measures and importance of close PCP follow up for aggressive stroke risk factor management including BP goal<130/90, HLD with LDL goal<70 and DM with A1c.<7 .  Stroke labs 10/2021: LDL 142, A1c 5.3 - PCP plans on repeat next month, if LDL remains above goal of 70, would recommend to increase atorvastatin dosage I have gone over the pathophysiology of stroke, warning signs and symptoms, risk factors and their management in some detail with instructions to go to the closest emergency room for symptoms of concern.     Follow up in 5 months or call earlier if needed   CC:  [facility] provider: Dr. Pearlean Brownie PCP: Tally Joe, MD    I spent 57 minutes of face-to-face and non-face-to-face time with patient.  This included previsit chart review including review of recent hospitalization, lab review, study review, order entry, electronic health record documentation, patient  education regarding recent stroke including etiology, secondary stroke prevention measures and importance of managing stroke risk factors, residual deficits and typical recovery time and answered all other questions to patient satisfaction   Ihor Austin, AGNP-BC  [facility] [address] [place], [place] [address]  Phone [phone] Fax [phone] Note: This document was prepared with digital dictation and possible smart phrase technology. Any transcriptional errors that result from this process are unintentional.

## 2022-02-09 ENCOUNTER — Encounter: Payer: Self-pay | Admitting: Adult Health

## 2022-02-09 ENCOUNTER — Ambulatory Visit: Payer: BLUE CROSS/BLUE SHIELD | Admitting: Adult Health

## 2022-02-09 VITALS — BP 134/86 | HR 87 | Ht 64.0 in | Wt 172.2 lb

## 2022-02-09 DIAGNOSIS — Z09 Encounter for follow-up examination after completed treatment for conditions other than malignant neoplasm: Secondary | ICD-10-CM

## 2022-02-09 DIAGNOSIS — I610 Nontraumatic intracerebral hemorrhage in hemisphere, subcortical: Secondary | ICD-10-CM | POA: Diagnosis not present

## 2022-02-09 NOTE — Progress Notes (Signed)
I agree with the above plan 

## 2022-02-09 NOTE — Patient Instructions (Addendum)
Continue working with physical and occupational therapies   Continue atorvastatin 20 mg daily for secondary stroke prevention  Continue to follow up with PCP regarding blood pressure and cholesterol management  Maintain strict control of hypertension with blood pressure goal below 130/90 and cholesterol with LDL cholesterol (bad cholesterol) goal below 70 mg/dL.   Signs of a Stroke? Follow the BEFAST method:  Balance Watch for a sudden loss of balance, trouble with coordination or vertigo Eyes Is there a sudden loss of vision in one or both eyes? Or double vision?  Face: Ask the person to smile. Does one side of the face droop or is it numb?  Arms: Ask the person to raise both arms. Does one arm drift downward? Is there weakness or numbness of a leg? Speech: Ask the person to repeat a simple phrase. Does the speech sound slurred/strange? Is the person confused ? Time: If you observe any of these signs, call 911.     Followup in the future with me in 5 months or call earlier if needed      Thank you for coming to see [place] at [facility]. I hope we have been able to provide you high quality care today.  You may receive a patient satisfaction survey over the next few weeks. We would appreciate your feedback and comments so that we may continue to improve ourselves and the health of our patients.    Stroke Prevention Some medical conditions and lifestyle choices can lead to a higher risk for a stroke. You can help to prevent a stroke by eating healthy foods and exercising. It also helps to not smoke and to manage any health problems you may have. How can this condition affect me? A stroke is an emergency. It should be treated right away. A stroke can lead to brain damage or threaten your life. There is a better chance of surviving and getting better after a stroke if you get medical help right away. What can increase my risk? The following medical conditions may  increase your risk of a stroke: Diseases of the heart and blood vessels (cardiovascular disease). High blood pressure (hypertension). Diabetes. High cholesterol. Sickle cell disease. Problems with blood clotting. Being very overweight. Sleeping problems (obstructivesleep apnea). Other risk factors include: Being older than age 80. A history of blood clots, stroke, or mini-stroke (TIA). Race, ethnic background, or a family history of stroke. Smoking or using tobacco products. Taking birth control pills, especially if you smoke. Heavy alcohol and drug use. Not being active. What actions can I take to prevent this? Manage your health conditions High cholesterol. Eat a healthy diet. If this is not enough to manage your cholesterol, you may need to take medicines. Take medicines as told by your doctor. High blood pressure. Try to keep your blood pressure below 130/80. If your blood pressure cannot be managed through a healthy diet and regular exercise, you may need to take medicines. Take medicines as told by your doctor. Ask your doctor if you should check your blood pressure at home. Have your blood pressure checked every year. Diabetes. Eat a healthy diet and get regular exercise. If your blood sugar (glucose) cannot be managed through diet and exercise, you may need to take medicines. Take medicines as told by your doctor. Talk to your doctor about getting checked for sleeping problems. Signs of a problem can include: Snoring a lot. Feeling very tired. Make sure that you manage any other conditions you have. Nutrition  Follow instructions from your doctor about what to eat or drink. You may be told to: Eat and drink fewer calories each day. Limit how much salt (sodium) you use to 1,500 milligrams (mg) each day. Use only healthy fats for cooking, such as olive oil, canola oil, and sunflower oil. Eat healthy foods. To do this: Choose foods that are high in fiber. These include  whole grains, and fresh fruits and vegetables. Eat at least 5 servings of fruits and vegetables a day. Try to fill one-half of your plate with fruits and vegetables at each meal. Choose low-fat (lean) proteins. These include low-fat cuts of meat, chicken without skin, fish, tofu, beans, and nuts. Eat low-fat dairy products. Avoid foods that: Are high in salt. Have saturated fat. Have trans fat. Have cholesterol. Are processed or pre-made. Count how many carbohydrates you eat and drink each day. Lifestyle If you drink alcohol: Limit how much you have to: 0-1 drink a day for women who are not pregnant. 0-2 drinks a day for men. Know how much alcohol is in your drink. In the [place] one drink equals one 12 oz bottle of beer ( ), one 5 oz glass of wine ( ), or one 1 oz glass of hard liquor (33mL). Do not smoke or use any products that have nicotine or tobacco. If you need help quitting, ask your doctor. Avoid secondhand smoke. Do not use drugs. Activity  Try to stay at a healthy weight. Get at least 30 minutes of exercise on most days, such as: Fast walking. Biking. Swimming. Medicines Take over-the-counter and prescription medicines only as told by your doctor. Avoid taking birth control pills. Talk to your doctor about the risks of taking birth control pills if: You are over 27 years old. You smoke. You get very bad headaches. You have had a blood clot. Where to find more information American Stroke Association: www.strokeassociation.org Get help right away if: You or a loved one has any signs of a stroke. "BE FAST" is an easy way to remember the warning signs: B - Balance. Dizziness, sudden trouble walking, or loss of balance. E - Eyes. Trouble seeing or a change in how you see. F - Face. Sudden weakness or loss of feeling of the face. The face or eyelid may droop on one side. A - Arms. Weakness or loss of feeling in an arm. This happens all of a sudden and most often  on one side of the body. S - Speech. Sudden trouble speaking, slurred speech, or trouble understanding what people say. T - Time. Time to call emergency services. Write down what time symptoms started. You or a loved one has other signs of a stroke, such as: A sudden, very bad headache with no known cause. Feeling like you may vomit (nausea). Vomiting. A seizure. These symptoms may be an emergency. Get help right away. Call your local emergency services (911 in the [place] Do not wait to see if the symptoms will go away. Do not drive yourself to the hospital. Summary You can help to prevent a stroke by eating healthy, exercising, and not smoking. It also helps to manage any health problems you have. Do not smoke or use any products that contain nicotine or tobacco. Get help right away if you or a loved one has any signs of a stroke. This information is not intended to replace advice given to you by your health care provider. Make sure you discuss any questions you have with your health care provider.  Document Revised: 12/04/2019 Document Reviewed: 12/04/2019 Elsevier Patient Education  [place].

## 2022-02-10 ENCOUNTER — Ambulatory Visit: Payer: BLUE CROSS/BLUE SHIELD | Admitting: Occupational Therapy

## 2022-02-10 ENCOUNTER — Encounter: Payer: Self-pay | Admitting: Physical Therapy

## 2022-02-10 ENCOUNTER — Encounter: Payer: Self-pay | Admitting: Occupational Therapy

## 2022-02-10 ENCOUNTER — Ambulatory Visit: Payer: BLUE CROSS/BLUE SHIELD | Admitting: Physical Therapy

## 2022-02-10 VITALS — BP 118/73 | HR 57

## 2022-02-10 DIAGNOSIS — I69153 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: Secondary | ICD-10-CM | POA: Diagnosis not present

## 2022-02-10 DIAGNOSIS — R278 Other lack of coordination: Secondary | ICD-10-CM | POA: Diagnosis not present

## 2022-02-10 DIAGNOSIS — M6281 Muscle weakness (generalized): Secondary | ICD-10-CM | POA: Diagnosis not present

## 2022-02-10 DIAGNOSIS — R2689 Other abnormalities of gait and mobility: Secondary | ICD-10-CM | POA: Diagnosis not present

## 2022-02-10 DIAGNOSIS — R2681 Unsteadiness on feet: Secondary | ICD-10-CM

## 2022-02-10 DIAGNOSIS — R4701 Aphasia: Secondary | ICD-10-CM | POA: Diagnosis not present

## 2022-02-10 DIAGNOSIS — I69151 Hemiplegia and hemiparesis following nontraumatic intracerebral hemorrhage affecting right dominant side: Secondary | ICD-10-CM | POA: Diagnosis not present

## 2022-02-10 DIAGNOSIS — R41841 Cognitive communication deficit: Secondary | ICD-10-CM | POA: Diagnosis not present

## 2022-02-10 DIAGNOSIS — R41842 Visuospatial deficit: Secondary | ICD-10-CM | POA: Diagnosis not present

## 2022-02-10 DIAGNOSIS — R208 Other disturbances of skin sensation: Secondary | ICD-10-CM | POA: Diagnosis not present

## 2022-02-10 NOTE — Therapy (Signed)
OUTPATIENT PHYSICAL THERAPY TREATMENT NOTE   Patient Name: Denise Herrera MRN: [id number] DOB:[birth year], 51 y.o., female Today's Date: 02/10/2022  PCP: Antony Contras, MD REFERRING PROVIDER: Cathlyn Parsons, PA-C  END OF SESSION:   PT End of Session - 02/10/22 0849     Visit Number 17    Number of Visits 21   13+8   Date for PT Re-Evaluation [id number]   re-cert   Authorization Type [employer]    PT Start Time 0848    PT Stop Time 0931    PT Time Calculation (min) 43 min    Equipment Utilized During Treatment Gait belt    Activity Tolerance Patient tolerated treatment well    Behavior During Therapy WFL for tasks assessed/performed              Past Medical History:  Diagnosis Date   Chronic insomnia    History of migraine headaches    Narcolepsy without cataplexy(347.00)    Past Surgical History:  Procedure Laterality Date   CESAREAN SECTION     gyn cervical procedure     Patient Active Problem List   Diagnosis Date Noted   ICH (intracerebral hemorrhage) ([place]) 11/03/2021   Essential hypertension 09/05/2015   INSOMNIA, CHRONIC 03/01/2007   MIGRAINE HEADACHE 03/01/2007   Narcolepsy without cataplexy 03/01/2007    REFERRING DIAG: I61.0 (ICD-10-CM) - Nontraumatic subcortical hemorrhage of left cerebral hemisphere ([place])   THERAPY DIAG:  Hemiplegia and hemiparesis following nontraumatic intracerebral hemorrhage affecting right dominant side (HCC)  Other lack of coordination  Unsteadiness on feet  Muscle weakness (generalized)  Rationale for Evaluation and Treatment Rehabilitation  PERTINENT HISTORY: ADHD, migraines, HTN, hyperlipidemia   Presented 11/05/2021 with acute onset of right-sided weakness and aphasia.  Left CVA/ICH confirmed. Admitted to rehab on 11/08/2021 and discharged 11/29/2021.  PRECAUTIONS: Fall  SUBJECTIVE: No new complaints. No falls/pain to report.  She saw the neurologist yesterday and feels she got a good report.  Is  considering something medicine-wise to manage the spasticity on her right side following discussion with neurologist.  PAIN:  Are you having pain? No- N/T in right arm/leg, but would not consider this pain.  VITALS: Before session (LUE): Today's Vitals   02/10/22 0853  BP: 118/73  Pulse: (!) 57      TODAY'S TREATMENT: STRENGTHENING   Treadmill x 10 minutes performed w/ right 4# ankle wt for added kinesthetic awareness during swing phase.  Pt relies on forward oriented UE support w/o balance issues maintaining upright posture throughout.  Moderate cuing for right hip/knee flexion and to prevent circumduction and intermittent toe scuff.  Performed incline cycle for 2 minutes at 2% and 1 minute at 0% x5 cycles.  SBA.   Bilateral leg press 2x12 at 120# > RLE only 2x10 at 60#   BALANCE/NMR: -Standing on airex feet apart raising 2# wt bar to 90 deg shoulder flexion > feet together raising to 90 deg shoulder flexion, therapist facilitating periscapular movement and preventing shoulder hike throughout movement arch > feet together lateral rotations w/ 2# wt bar alt RUE on top and bottom -Marching on airex x5min  PATIENT EDUCATION: Education details:  Continue HEP and progressing walking program. Person educated: Patient Education method: Explanation Education comprehension: verbalized understanding   HOME EXERCISE PROGRAM: Access Code: [facility] URL: [email or web address] Date: 12/30/2021 Prepared by: Elease Etienne  Exercises - Standing Terminal Knee Extension with Resistance  - 1 x daily - 7 x weekly - 3 sets - 10 reps -  Forward Step Up with Counter Support  - 1 x daily - 7 x weekly - 3 sets - 10 reps - Standing Single Leg Stance with Counter Support  - 1 x daily - 7 x weekly - 3 sets - 10 reps - Squat  - 1 x daily - 7 x weekly - 3 sets - 10 reps - Side Stepping with Resistance at Thighs  - 1 x daily - 7 x weekly - 3 sets - 10 reps (green theraband) - Forward  and Ba/ckward Monster Walk with Counter Support  - 1 x daily - 7 x weekly - 3 sets - 10 reps (green theraband) - Sit to Stand with Armchair (Wedge Under L Foot) - 1 x daily - 7 x weekly - 3 sets - 10 reps  You Can Walk For A Certain Length Of Time Each Day                          Walk 10 minutes 2-3 times per day.             Increase 3-5  minutes every 7 days              Work up to 30 minutes (1-2 times per day).               Example:                         Day 1-2           4-5 minutes     3 times per day                         Day 7-8           10-12 minutes 2-3 times per day                         Day 13-14       20-22 minutes 1-2 times per day  NEW GOALS: Goals reviewed with patient? Yes SHORT TERM GOALS=LONG TERM GOALS: Target date: 02/20/2022  Pt will increase FOTO score to >/=75% in order to demonstrate subjective functional improvement. Baseline: 68%; 01/23/2022 68% Goal status: ONGOING  2.  Pt will navigate 16 stairs using no rails supervision level reciprocally to improve safe functional mobility in home environment. Baseline: 4 step-to leaning against half wall/rail; 01/23/2022 16 step-to w/o leaning against rail S* Goal status: ONGOING  3.  Pt will ambulate >/=1000 feet with LRAD and ModI level of assist over various indoor and outdoor surfaces demonstrating improved right quad control and hip flexion during swing phase to promote safe household and community access. Baseline: 254' SBA RW; 01/23/2022 845' no AD SBA-CGA Goal status: REVISED  4.  Pt will demonstrate a gait speed of >/=3.3 feet/sec in order to decrease risk for falls. Baseline: 1.90 ft/sec; 01/23/2022 3.03 ft/sec Goal status: REVISED  ASSESSMENT:  CLINICAL IMPRESSION: Focus of skilled PT session today on continuing to address RLE spasticity in open chain during treadmill training.  Pt is tolerating 120 pounds bilaterally on the leg press without issue and good quad control noted on RLE and tolerates  progression to 60 pounds on the right lower extremity alone.  Her motor function is overall improving with pt still having difficulty with right hemibody spasticity and numbness.  She continues to benefit from ongoing  PT POC to progress overall function in a high level capacity.  OBJECTIVE IMPAIRMENTS Abnormal gait, decreased balance, decreased cognition, decreased endurance, and decreased strength.   ACTIVITY LIMITATIONS carrying, lifting, and locomotion level  PARTICIPATION LIMITATIONS: driving, community activity, occupation, and yard work  PERSONAL FACTORS Age, Fitness, Transportation, and 1 comorbidity: HTN are also affecting patient's functional outcome.   REHAB POTENTIAL: Excellent  CLINICAL DECISION MAKING: Stable/uncomplicated  EVALUATION COMPLEXITY: Low  PLAN: PT FREQUENCY: 2x/week  PT DURATION: 4 weeks  PLANNED INTERVENTIONS: Therapeutic exercises, Therapeutic activity, Neuromuscular re-education, Balance training, Gait training, Patient/Family education, Self Care, Stair training, Vestibular training, DME instructions, Electrical stimulation, Biofeedback, and Re-evaluation  PLAN FOR NEXT SESSION:  work on stair safety-trying to progress towards reciprocal; high level hip strengthening and balance, continue leg press, lifting progressions to overhead to prepare for return to work, treadmill training on incline-ankle wts (R primarily), practice getting on and off ladder (job requirement), R open chain activity    Elease Etienne, PT, DPT Outpatient Neuro [facility] [address], [place], [place] [address] [phone] 02/10/22, 10:07 AM

## 2022-02-10 NOTE — Therapy (Signed)
OUTPATIENT OCCUPATIONAL THERAPY NEURO Treatment  Patient Name: Denise Herrera MRN: [id number] DOB:10-21-1970, 51 y.o., female Today's Date: 02/10/2022  PCP: Dr. Antony Contras REFERRING PROVIDER: Cathlyn Parsons, PA-C    OT End of Session - 02/10/22 [phone]     Visit Number 10    Number of Visits 25    Date for OT Re-Evaluation 03/11/22    Authorization Type BC[employer], covered 100% (OOP met), visit limit--MN    OT Start Time 0802    OT Stop Time 0845    OT Time Calculation (min) 43 min    Activity Tolerance Patient tolerated treatment well    Behavior During Therapy WFL for tasks assessed/performed                 Past Medical History:  Diagnosis Date   Chronic insomnia    History of migraine headaches    Narcolepsy without cataplexy(347.00)    Past Surgical History:  Procedure Laterality Date   CESAREAN SECTION     gyn cervical procedure     Patient Active Problem List   Diagnosis Date Noted   ICH (intracerebral hemorrhage) ([place]) 11/03/2021   Essential hypertension 09/05/2015   INSOMNIA, CHRONIC 03/01/2007   MIGRAINE HEADACHE 03/01/2007   Narcolepsy without cataplexy 03/01/2007    ONSET DATE: 11/05/21  REFERRING DIAG: I61.0 (ICD-10-CM) - Nontraumatic subcortical hemorrhage of left cerebral hemisphere ([place])   THERAPY DIAG:  Hemiplegia and hemiparesis following nontraumatic intracerebral hemorrhage affecting right dominant side ([place])  Other lack of coordination  Unsteadiness on feet  Rationale for Evaluation and Treatment Rehabilitation  SUBJECTIVE:   SUBJECTIVE STATEMENT: It's not bothering me much but the doctor says I have spasticity  PERTINENT HISTORY: 11/05/2021 with acute onset of right-sided weakness and aphasia. Blood pressure mildly elevated 141/85. CT of the head showed acute parenchymal hemorrhage in the left corona radiata with intraventricular extension.  Hospital d/c 11/28/21.  PMH:  history of ADHD maintained on Ritalin, migraine headaches,  hypertension, hyperlipidemia, hx of narcolepsy without cataplex, Hx of R RTC repair 03/2021.     PRECAUTIONS: Fall  WEIGHT BEARING RESTRICTIONS No  PAIN:  Are you having pain? No, only has minimal pain with higher range sh flex/abd Rt shoulder and upper traps 1-4/10  FALLS: Has patient fallen in last 6 months? 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   Chair push ups x 10 w/ cues to Rt side. Wall push ups x 10 w/ cues for Rt scapula  High level closed chain sh flexion RUE using UE Ranger on wall. Cues to evenly distribute weight on LE's  Quadraped: cat/cow stretch followed by A/P wt shifts for scapula mobilization. Prone on elbows: chest lift for scapula depression and sh girdle strengthening/stability   UBE x 8 mintues, level 2 for UE strength/endurance and normal reciprocal movement pattern    HOME EXERCISE PROGRAM: 8/18/23coordination HEP 01/07/22: Shoulder HEP  01/21/22: Updated to HEP for proximal strengthening   GOALS: Potential Goals reviewed with patient? Yes  SHORT TERM GOALS: Target date: 01/23/22- extended due to schedule  Pt will be independent  with initial HEP. Goal status: MET  2.  Pt will improve dominant RUE coordination/functional reaching for ADLs as shown by improving score on box and blocks test by at least 8. Baseline: 34 blocks Goal status: IN PROGRESS (01/29/22: 40 blocks)  3.  Pt will use RUE as dominant UE for eating and grooming 100% of the time. Goal status: IN PROGRESS (Eating 100%, grooming 50%)   4.  Pt will perform  simple home maintenance tasks with supervision. Goal status: MET  5.  Pt will perform environmental scanning/navigation with at least 95% accuracy in busy environment. Goal status: IN PROGRESS  6.  Pt will be able to write at least 2 sentences with good legibility in reasonable amount of time. Goal status: MET  LONG TERM GOALS: Target date: 03/11/22  Pt will be independent with updated HEP Goal status: IN PROGRESS  2.  Pt will improve R hand coordination for ADLs as shown by completing 9-hole peg test in less than 90sec. Baseline: only able to put in 1 peg within 28mn Goal status: IN PROGRESS  3.  Pt will use RUE as dominant UE for eating and grooming at least 75% of the time. Goal status: INITIAL  4.  Pt will perform simple-mod complex home maintenance tasks mod I. Goal status: INITIAL  5.  Pt will improve RUE strength to be be able to retrieve 3lb object from overhead shelf safely x3. Goal status: INITIAL  6.  Pt will improve R grip strength to at least 30lbs to assist in lifting objects/opening containers. Goal status: INITIAL  7.  Pt will perform environmental scanning/navigation in busy environment with at least 95% accuracy for incr safety.   Goal status: INITIAL  ASSESSMENT:  CLINICAL IMPRESSION: Pt progressing towards goals. Her RUE remains limited due to sensory deficits and mild spasticity.  PERFORMANCE DEFICITS in functional skills including ADLs, IADLs, coordination, dexterity, sensation, ROM, strength, FMC, GMC, mobility, balance, decreased knowledge of precautions, decreased knowledge of use of DME, vision, and UE functional use, cognitive skills including memory, and psychosocial skills including habits.   IMPAIRMENTS are limiting patient from ADLs, IADLs, work, leisure, and social participation.   COMORBIDITIES may have co-morbidities  that affects occupational performance. Patient will benefit from skilled OT to address above impairments and improve overall  function.  MODIFICATION OR ASSISTANCE TO COMPLETE EVALUATION: Min-Moderate modification of tasks or assist with assess necessary to complete an evaluation.  OT OCCUPATIONAL PROFILE AND HISTORY: Detailed assessment: Review of records and additional review of physical, cognitive, psychosocial history related to current functional performance.  CLINICAL DECISION MAKING: Moderate - several treatment options, min-mod task modification necessary  REHAB POTENTIAL: Good  EVALUATION COMPLEXITY: Moderate    PLAN: OT FREQUENCY: 2x/week  OT DURATION: 12 weeks +eval  PLANNED INTERVENTIONS: self care/ADL training, therapeutic exercise, therapeutic activity, neuromuscular re-education, manual therapy, passive range of motion, balance training, functional mobility training, aquatic therapy, splinting, electrical stimulation, ultrasound, paraffin, fluidotherapy, moist heat, cryotherapy, patient/family education, cognitive remediation/compensation, visual/perceptual remediation/compensation, energy conservation, and DME and/or AE instructions  RECOMMENDED OTHER SERVICES: none at this time  CONSULTED AND AGREED WITH PLAN OF CARE: Patient  PLAN FOR NEXT SESSION:  continue neuro re-ed RUE, in hand manipulation, functional reaching RUE, assess taping and add piece to activate trunk ext and scapula depression if tape has helped   [employer] OTR/L 02/10/2022, 8:12 AM

## 2022-02-12 ENCOUNTER — Ambulatory Visit: Payer: BLUE CROSS/BLUE SHIELD | Admitting: Occupational Therapy

## 2022-02-12 ENCOUNTER — Ambulatory Visit: Payer: BLUE CROSS/BLUE SHIELD | Admitting: Physical Therapy

## 2022-02-12 ENCOUNTER — Encounter: Payer: Self-pay | Admitting: Physical Therapy

## 2022-02-12 VITALS — BP 125/79 | HR 68

## 2022-02-12 DIAGNOSIS — R278 Other lack of coordination: Secondary | ICD-10-CM | POA: Diagnosis not present

## 2022-02-12 DIAGNOSIS — R41842 Visuospatial deficit: Secondary | ICD-10-CM

## 2022-02-12 DIAGNOSIS — M6281 Muscle weakness (generalized): Secondary | ICD-10-CM

## 2022-02-12 DIAGNOSIS — I69151 Hemiplegia and hemiparesis following nontraumatic intracerebral hemorrhage affecting right dominant side: Secondary | ICD-10-CM

## 2022-02-12 DIAGNOSIS — R2681 Unsteadiness on feet: Secondary | ICD-10-CM

## 2022-02-12 DIAGNOSIS — R208 Other disturbances of skin sensation: Secondary | ICD-10-CM

## 2022-02-12 DIAGNOSIS — R4701 Aphasia: Secondary | ICD-10-CM | POA: Diagnosis not present

## 2022-02-12 DIAGNOSIS — R2689 Other abnormalities of gait and mobility: Secondary | ICD-10-CM | POA: Diagnosis not present

## 2022-02-12 DIAGNOSIS — I69153 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: Secondary | ICD-10-CM

## 2022-02-12 DIAGNOSIS — R41841 Cognitive communication deficit: Secondary | ICD-10-CM | POA: Diagnosis not present

## 2022-02-12 NOTE — Therapy (Signed)
OUTPATIENT OCCUPATIONAL THERAPY NEURO Treatment  Patient Name: Denise Herrera MRN: [id number] DOB:[birth year], 51 y.o., female Today's Date: 02/12/2022  PCP: Dr. Antony Contras REFERRING PROVIDER: Cathlyn Parsons, PA-C    OT End of Session - 02/12/22 1640     Visit Number 11    Number of Visits 25    Date for OT Re-Evaluation 03/11/22    Authorization Type BC[employer], covered 100% (OOP met), visit limit--MN    OT Start Time 0848    OT Stop Time 0930    OT Time Calculation (min) 42 min    Activity Tolerance Patient tolerated treatment well    Behavior During Therapy WFL for tasks assessed/performed                  Past Medical History:  Diagnosis Date   Chronic insomnia    History of migraine headaches    Narcolepsy without cataplexy(347.00)    Past Surgical History:  Procedure Laterality Date   CESAREAN SECTION     gyn cervical procedure     Patient Active Problem List   Diagnosis Date Noted   ICH (intracerebral hemorrhage) ([place]) 11/03/2021   Essential hypertension 09/05/2015   INSOMNIA, CHRONIC 03/01/2007   MIGRAINE HEADACHE 03/01/2007   Narcolepsy without cataplexy 03/01/2007    ONSET DATE: 11/05/21  REFERRING DIAG: I61.0 (ICD-10-CM) - Nontraumatic subcortical hemorrhage of left cerebral hemisphere ([place])   THERAPY DIAG:  Hemiplegia and hemiparesis following nontraumatic intracerebral hemorrhage affecting right dominant side (HCC)  Other lack of coordination  Unsteadiness on feet  Muscle weakness (generalized)  Visuospatial deficit  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 (HCC)  Other disturbances of skin sensation  Rationale for Evaluation and Treatment Rehabilitation  SUBJECTIVE:   SUBJECTIVE STATEMENT: It's not bothering me much but the doctor says I have spasticity  PERTINENT HISTORY: 11/05/2021 with acute onset of right-sided weakness and aphasia. Blood pressure mildly  elevated 141/85. CT of the head showed acute parenchymal hemorrhage in the left corona radiata with intraventricular extension.  Hospital d/c 11/28/21.  PMH:  history of ADHD maintained on Ritalin, migraine headaches, hypertension, hyperlipidemia, hx of narcolepsy without cataplex, Hx of R RTC repair 03/2021.     PRECAUTIONS: Fall  WEIGHT BEARING RESTRICTIONS No  PAIN:  Are you having pain? No, only has minimal pain with higher range sh flex/abd Rt shoulder and upper traps 1-4/10  FALLS: Has patient fallen in last 6 months? 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 Wall push ups x 10 w/ cues for Rt scapula  High level closed chain sh flexion with medium ball rolling up and down wall, min facilitation, Cues to evenly distribute weight on LE's  Quadraped: cat/cow stretch followed rocking forwards and backwards in quadraped, (for child's pose and cobra)min facilitation/ v.c    Prone on elbows: chest lift for scapula depression and sh girdle strengthening/stability, then modified plank   UBE x 8 minutes, level 2 for  UE strength/endurance and normal reciprocal movement pattern  Placing grooved pegs with RUE with max difficulty/ v.c    HOME EXERCISE PROGRAM: 8/18/23coordination HEP 01/07/22: Shoulder HEP  01/21/22: Updated to HEP for proximal strengthening   GOALS: Potential Goals reviewed with patient? Yes  SHORT TERM GOALS: Target date: 01/23/22-  extended due to schedule  Pt will be independent with initial HEP. Goal status: MET  2.  Pt will improve dominant RUE coordination/functional reaching for ADLs as shown by improving score on box and blocks test by at least 8. Baseline: 34 blocks Goal status: IN PROGRESS (01/29/22: 40 blocks)  3.  Pt will use RUE as dominant UE for eating and grooming 100% of the time. Goal status: IN PROGRESS (Eating 100%, grooming 50%)   4.  Pt will perform simple home maintenance tasks with supervision. Goal status: MET  5.  Pt will perform environmental scanning/navigation with at least 95% accuracy in busy environment. Goal status: IN PROGRESS  6.  Pt will be able to write at least 2 sentences with good legibility in reasonable amount of time. Goal status: MET  LONG TERM GOALS: Target date: 03/11/22  Pt will be independent with updated HEP Goal status: IN PROGRESS  2.  Pt will improve R hand coordination for ADLs as shown by completing 9-hole peg test in less than 90sec. Baseline: only able to put in 1 peg within 24mn Goal status: IN PROGRESS  3.  Pt will use RUE as dominant UE for eating and grooming at least 75% of the time. Goal status: INITIAL  4.  Pt will perform simple-mod complex home maintenance tasks mod I. Goal status: INITIAL  5.  Pt will improve RUE strength to be be able to retrieve 3lb object from overhead shelf safely x3. Goal status: INITIAL  6.  Pt will improve R grip strength to at least 30lbs to assist in lifting objects/opening containers. Goal status: INITIAL  7.  Pt will perform environmental scanning/navigation in busy environment with at least 95% accuracy for incr safety.   Goal status: INITIAL  ASSESSMENT:  CLINICAL IMPRESSION: Pt progressing towards goals. She remains limited due to sensory deficits, weakness and spasticity.  PERFORMANCE DEFICITS in functional skills including ADLs, IADLs, coordination, dexterity, sensation, ROM, strength, FMC, GMC,  mobility, balance, decreased knowledge of precautions, decreased knowledge of use of DME, vision, and UE functional use, cognitive skills including memory, and psychosocial skills including habits.   IMPAIRMENTS are limiting patient from ADLs, IADLs, work, leisure, and social participation.   COMORBIDITIES may have co-morbidities  that affects occupational performance. Patient will benefit from skilled OT to address above impairments and improve overall function.  MODIFICATION OR ASSISTANCE TO COMPLETE EVALUATION: Min-Moderate modification of tasks or assist with assess necessary to complete an evaluation.  OT OCCUPATIONAL PROFILE AND HISTORY: Detailed assessment: Review of records and additional review of physical, cognitive, psychosocial history related to current functional performance.  CLINICAL DECISION MAKING: Moderate - several treatment options, min-mod task modification necessary  REHAB POTENTIAL: Good  EVALUATION COMPLEXITY: Moderate    PLAN: OT FREQUENCY: 2x/week  OT DURATION: 12 weeks +eval  PLANNED INTERVENTIONS: self care/ADL training, therapeutic exercise, therapeutic activity, neuromuscular re-education, manual therapy, passive range of motion, balance training, functional mobility training, aquatic therapy, splinting, electrical stimulation, ultrasound, paraffin, fluidotherapy, moist heat, cryotherapy, patient/family education, cognitive remediation/compensation, visual/perceptual remediation/compensation, energy conservation, and DME and/or AE instructions  RECOMMENDED OTHER SERVICES: none at this time  CONSULTED AND AGREED WITH PLAN OF CARE: Patient  PLAN FOR  NEXT SESSION:  continue neuro re-ed RUE, in hand manipulation, functional reaching RUE, assess taping and add piece to activate trunk ext and scapula depression if tape has helped   Marzetta Lanza, OTR/L 02/12/2022, 4:40 PM

## 2022-02-12 NOTE — Therapy (Signed)
OUTPATIENT PHYSICAL THERAPY TREATMENT NOTE   Patient Name: Denise Herrera MRN: [id number] DOB:04/07/1971, 51 y.o., female Today's Date: 02/12/2022  PCP: Antony Contras, MD REFERRING PROVIDER: Cathlyn Parsons, PA-C  END OF SESSION:   PT End of Session - 02/12/22 0939     Visit Number 18    Number of Visits 21   13+8   Date for PT Re-Evaluation [id number]   re-cert   Authorization Type BLUE CROSS BLUE [employer]IELD    PT Start Time 0933    PT Stop Time 1015    PT Time Calculation (min) 42 min    Equipment Utilized During Treatment Gait belt    Activity Tolerance Patient tolerated treatment well    Behavior During Therapy WFL for tasks assessed/performed              Past Medical History:  Diagnosis Date   Chronic insomnia    History of migraine headaches    Narcolepsy without cataplexy(347.00)    Past Surgical History:  Procedure Laterality Date   CESAREAN SECTION     gyn cervical procedure     Patient Active Problem List   Diagnosis Date Noted   ICH (intracerebral hemorrhage) ([place]) 11/03/2021   Essential hypertension 09/05/2015   INSOMNIA, CHRONIC 03/01/2007   MIGRAINE HEADACHE 03/01/2007   Narcolepsy without cataplexy 03/01/2007    REFERRING DIAG: I61.0 (ICD-10-CM) - Nontraumatic subcortical hemorrhage of left cerebral hemisphere ([place])   THERAPY DIAG:  Hemiplegia and hemiparesis following nontraumatic intracerebral hemorrhage affecting right dominant side (HCC)  Other lack of coordination  Unsteadiness on feet  Muscle weakness (generalized)  Other abnormalities of gait and mobility  Rationale for Evaluation and Treatment Rehabilitation  PERTINENT HISTORY: ADHD, migraines, HTN, hyperlipidemia   Presented 11/05/2021 with acute onset of right-sided weakness and aphasia.  Left CVA/ICH confirmed. Admitted to rehab on 11/08/2021 and discharged 11/29/2021.  PRECAUTIONS: Fall  SUBJECTIVE: No new complaints. No falls/pain to report.  Had a good and tough workout  with OT, discovered more weakness in her shoulder from doing a plank w/ OT.  PAIN:  Are you having pain? No-tightness in right heel cord, not painful.  VITALS: Before session (LUE): Today's Vitals   02/12/22 0937  BP: 125/79  Pulse: 68      TODAY'S TREATMENT: STRENGTHENING  6" step ups x20 alt LE w/ 3# right ankle wt  STAIRS:  Level of Assistance: Complete Independence and SBA  Stair Negotiation Technique: Alternating Pattern  Forwards with No Rails  Number of Stairs: 16   Height of Stairs: 6"  Comments: 3# wt donned on RLE for kinesthetic awareness.  Pt demonstrates improved motor control and coordination of the RLE in open chain with repetition.  She is steady throughout with min cuing for safe pacing of activity.  -Soft BOSU step-ups w/ unilateral UE support on // bars x20 each LE > SLS no UE on soft BOSU -Lifting wide 10# crate w/ fwd/bckwrd stepping to place crate at waist height x5 w/ emphasis on elbows tucked to prevent excessive compensation in abduction/IR/shoulder hike > regressed to no wt to focus on routine form x6 -Elliptical fwd x46minutes + bckward x50min at level 2.5, RPE 10 at termination of task  PATIENT EDUCATION: Education details:  Continue HEP and progressing walking program.  Discussed spasticity management options w/ encouragement for pt to talk to MD about medications if she desires at appts in October.  Pt is experiencing tightness in the right heel cord, she is stretching on a regular basis.  Person educated: Patient Education method: Explanation Education comprehension: verbalized understanding   HOME EXERCISE PROGRAM: Access Code: [facility] URL: [email or web address] Date: 12/30/2021 Prepared by: Elease Etienne  Exercises - Standing Terminal Knee Extension with Resistance  - 1 x daily - 7 x weekly - 3 sets - 10 reps - Forward Step Up with Counter Support  - 1 x daily - 7 x weekly - 3 sets - 10 reps - Standing Single Leg Stance  with Counter Support  - 1 x daily - 7 x weekly - 3 sets - 10 reps - Squat  - 1 x daily - 7 x weekly - 3 sets - 10 reps - Side Stepping with Resistance at Thighs  - 1 x daily - 7 x weekly - 3 sets - 10 reps (green theraband) - Forward and Ba/ckward Monster Walk with Counter Support  - 1 x daily - 7 x weekly - 3 sets - 10 reps (green theraband) - Sit to Stand with Armchair (Wedge Under L Foot) - 1 x daily - 7 x weekly - 3 sets - 10 reps  You Can Walk For A Certain Length Of Time Each Day                          Walk 10 minutes 2-3 times per day.             Increase 3-5  minutes every 7 days              Work up to 30 minutes (1-2 times per day).               Example:                         Day 1-2           4-5 minutes     3 times per day                         Day 7-8           10-12 minutes 2-3 times per day                         Day 13-14       20-22 minutes 1-2 times per day  NEW GOALS: Goals reviewed with patient? Yes SHORT TERM GOALS=LONG TERM GOALS: Target date: 02/20/2022  Pt will increase FOTO score to >/=75% in order to demonstrate subjective functional improvement. Baseline: 68%; 01/23/2022 68% Goal status: ONGOING  2.  Pt will navigate 16 stairs using no rails supervision level reciprocally to improve safe functional mobility in home environment. Baseline: 4 step-to leaning against half wall/rail; 01/23/2022 16 step-to w/o leaning against rail S* Goal status: ONGOING  3.  Pt will ambulate >/=1000 feet with LRAD and ModI level of assist over various indoor and outdoor surfaces demonstrating improved right quad control and hip flexion during swing phase to promote safe household and community access. Baseline: 254' SBA RW; 01/23/2022 845' no AD SBA-CGA Goal status: REVISED  4.  Pt will demonstrate a gait speed of >/=3.3 feet/sec in order to decrease risk for falls. Baseline: 1.90 ft/sec; 01/23/2022 3.03 ft/sec Goal status: REVISED  ASSESSMENT:  CLINICAL IMPRESSION: This  session focused on a variety of impairments including shoulder posturing during lifting tasks, stair management to promote open chain kinesthetic awareness and control  of RLE, and hip and knee flexion engagement in open chain strengthening.  She continues to do well with high level and physically demanding tasks from a strength and balance perspective, but she remains mildly limited in fine tuning her motor skills due to low level ongoing right sided spasticity.  She continues to contemplate medical management of this and in the meantime will benefit from remaining therapy visits to further progress towards goals.  OBJECTIVE IMPAIRMENTS Abnormal gait, decreased balance, decreased cognition, decreased endurance, and decreased strength.   ACTIVITY LIMITATIONS carrying, lifting, and locomotion level  PARTICIPATION LIMITATIONS: driving, community activity, occupation, and yard work  PERSONAL FACTORS Age, Fitness, Transportation, and 1 comorbidity: HTN are also affecting patient's functional outcome.   REHAB POTENTIAL: Excellent  CLINICAL DECISION MAKING: Stable/uncomplicated  EVALUATION COMPLEXITY: Low  PLAN: PT FREQUENCY: 2x/week  PT DURATION: 4 weeks  PLANNED INTERVENTIONS: Therapeutic exercises, Therapeutic activity, Neuromuscular re-education, Balance training, Gait training, Patient/Family education, Self Care, Stair training, Vestibular training, DME instructions, Electrical stimulation, Biofeedback, and Re-evaluation  PLAN FOR NEXT SESSION:  Review HEP and advance!  work on stair safety-trying to progress towards reciprocal; high level hip strengthening and balance, continue leg press, lifting progressions to overhead to prepare for return to work, treadmill training on incline-ankle wts (R primarily), practice getting on and off ladder (job requirement), R open chain activity, can try elliptical again    Elease Etienne, PT, DPT Outpatient Neuro [facility] [address]  [place], [place] [address] [phone] 02/12/22, 11:47 AM

## 2022-02-16 ENCOUNTER — Encounter: Payer: Self-pay | Admitting: Occupational Therapy

## 2022-02-16 ENCOUNTER — Ambulatory Visit: Payer: BLUE CROSS/BLUE SHIELD | Attending: Physician Assistant | Admitting: Occupational Therapy

## 2022-02-16 ENCOUNTER — Ambulatory Visit: Payer: BLUE CROSS/BLUE SHIELD | Admitting: Physical Therapy

## 2022-02-16 ENCOUNTER — Telehealth: Payer: Self-pay | Admitting: Adult Health

## 2022-02-16 ENCOUNTER — Encounter: Payer: Self-pay | Admitting: Physical Therapy

## 2022-02-16 VITALS — BP 125/73 | HR 66

## 2022-02-16 DIAGNOSIS — I69153 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: Secondary | ICD-10-CM | POA: Diagnosis not present

## 2022-02-16 DIAGNOSIS — M6281 Muscle weakness (generalized): Secondary | ICD-10-CM | POA: Insufficient documentation

## 2022-02-16 DIAGNOSIS — I69151 Hemiplegia and hemiparesis following nontraumatic intracerebral hemorrhage affecting right dominant side: Secondary | ICD-10-CM | POA: Insufficient documentation

## 2022-02-16 DIAGNOSIS — R2689 Other abnormalities of gait and mobility: Secondary | ICD-10-CM | POA: Insufficient documentation

## 2022-02-16 DIAGNOSIS — R208 Other disturbances of skin sensation: Secondary | ICD-10-CM | POA: Diagnosis not present

## 2022-02-16 DIAGNOSIS — R278 Other lack of coordination: Secondary | ICD-10-CM | POA: Insufficient documentation

## 2022-02-16 DIAGNOSIS — R2681 Unsteadiness on feet: Secondary | ICD-10-CM

## 2022-02-16 DIAGNOSIS — R41842 Visuospatial deficit: Secondary | ICD-10-CM | POA: Insufficient documentation

## 2022-02-16 NOTE — Therapy (Signed)
OUTPATIENT OCCUPATIONAL THERAPY NEURO Treatment  Patient Name: Denise Herrera MRN: [id number] DOB:[birth year], 51 y.o., female Today's Date: 02/16/2022  PCP: Dr. Antony Contras REFERRING PROVIDER: Cathlyn Parsons, PA-C    OT End of Session - 02/16/22 0831     Visit Number 12    Number of Visits 25    Date for OT Re-Evaluation 03/11/22    Authorization Type [employer], covered 100% (OOP met), visit limit--[place]    OT Start Time 0803    OT Stop Time 0845    OT Time Calculation (min) 42 min    Activity Tolerance Patient tolerated treatment well    Behavior During Therapy WFL for tasks assessed/performed                  Past Medical History:  Diagnosis Date   Chronic insomnia    History of migraine headaches    Narcolepsy without cataplexy(347.00)    Past Surgical History:  Procedure Laterality Date   CESAREAN SECTION     gyn cervical procedure     Patient Active Problem List   Diagnosis Date Noted   ICH (intracerebral hemorrhage) ([place]) 11/03/2021   Essential hypertension 09/05/2015   INSOMNIA, CHRONIC 03/01/2007   MIGRAINE HEADACHE 03/01/2007   Narcolepsy without cataplexy 03/01/2007    ONSET DATE: 11/05/21  REFERRING DIAG: I61.0 (ICD-10-CM) - Nontraumatic subcortical hemorrhage of left cerebral hemisphere ([place])   THERAPY DIAG:  Hemiplegia and hemiparesis following nontraumatic intracerebral hemorrhage affecting right dominant side (HCC)  Other lack of coordination  Muscle weakness (generalized)  Other disturbances of skin sensation  Rationale for Evaluation and Treatment Rehabilitation  SUBJECTIVE:   SUBJECTIVE STATEMENT: Can you retape my shoulder?   PERTINENT HISTORY: 11/05/2021 with acute onset of right-sided weakness and aphasia. Blood pressure mildly elevated 141/85. CT of the head showed acute parenchymal hemorrhage in the left corona radiata with intraventricular extension.  Hospital d/c 11/28/21.  PMH:  history of ADHD maintained on Ritalin,  migraine headaches, hypertension, hyperlipidemia, hx of narcolepsy without cataplex, Hx of R RTC repair 03/2021.     PRECAUTIONS: Fall  WEIGHT BEARING RESTRICTIONS No  PAIN:  Are you having pain? No, only has minimal pain with higher range sh flex/abd Rt shoulder and upper traps 1-4/10  FALLS: Has patient fallen in last 6 months? 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   Wall  push ups x 10 w/ cues to Rt side.    Prone: bilateral scapula retraction w/ mod cues to prevent compensations.  Prone on elbows: chest lift for scapula depression and sh girdle strengthening/stability.  Prone: sh extension RUE over edge of mat w/ cues to prevent sh IR and wrist flexion (pt w/ poor sensation/proprioception)   Seated at table: placing graded clothespins (yellow to green resistance) on antenna RUE then removing for functional reach. Pt then placing rubber washers on vertical poles RUE for functional reach.   UBE x 5 minutes, level 2 for UE strength/endurance and normal reciprocal movement pattern     HOME EXERCISE PROGRAM: 8/18/23coordination HEP 01/07/22:  Shoulder HEP  01/21/22: Updated to HEP for proximal strengthening   GOALS: Potential Goals reviewed with patient? Yes  SHORT TERM GOALS: Target date: 01/23/22- extended due to schedule  Pt will be independent with initial HEP. Goal status: MET  2.  Pt will improve dominant RUE coordination/functional reaching for ADLs as shown by improving score  on box and blocks test by at least 8. Baseline: 34 blocks Goal status: IN PROGRESS (01/29/22: 40 blocks)  3.  Pt will use RUE as dominant UE for eating and grooming 100% of the time. Goal status: IN PROGRESS (Eating 100%, grooming 50%)   4.  Pt will perform simple home maintenance tasks with supervision. Goal status: MET  5.  Pt will perform environmental scanning/navigation with at least 95% accuracy in busy environment. Goal status: IN PROGRESS  6.  Pt will be able to write at least 2 sentences with good legibility in reasonable amount of time. Goal status: MET  LONG TERM GOALS: Target date: 03/11/22  Pt will be independent with updated HEP Goal status: IN PROGRESS  2.  Pt will improve R hand coordination for ADLs as shown by completing 9-hole peg test in less than 90sec. Baseline: only able to put in 1 peg within 84mn Goal status: IN PROGRESS  3.  Pt will use RUE as dominant UE for eating and grooming at least 75% of the time. Goal status: INITIAL  4.  Pt will perform simple-mod complex home maintenance tasks mod I. Goal status: INITIAL  5.  Pt will improve RUE strength to be be able to retrieve 3lb object from overhead shelf safely x3. Goal status: INITIAL  6.  Pt will improve R grip strength to at least 30lbs to assist in lifting objects/opening containers. Goal status: INITIAL  7.  Pt will perform environmental scanning/navigation in busy environment with at least 95% accuracy for incr safety.   Goal status: INITIAL  ASSESSMENT:  CLINICAL IMPRESSION: Pt progressing towards goals. She remains limited due to sensory deficits, weakness and spasticity/dystonia. However, pt improving with functional tasks and reaching RPlatterin functional skills including ADLs, IADLs, coordination, dexterity, sensation, ROM, strength, FMC, GMC, mobility, balance, decreased knowledge of precautions, decreased knowledge of use of DME, vision, and UE functional use,  cognitive skills including memory, and psychosocial skills including habits.   IMPAIRMENTS are limiting patient from ADLs, IADLs, work, leisure, and social participation.   COMORBIDITIES may have co-morbidities  that affects occupational performance. Patient will benefit from skilled OT to address above impairments and improve overall function.  MODIFICATION OR ASSISTANCE TO COMPLETE EVALUATION: Min-Moderate modification of tasks or assist with assess necessary to complete an evaluation.  OT OCCUPATIONAL PROFILE AND HISTORY: Detailed assessment: Review of records and additional review of physical, cognitive, psychosocial history related to current functional performance.  CLINICAL DECISION MAKING: Moderate - several treatment options, min-mod task modification necessary  REHAB POTENTIAL: Good  EVALUATION COMPLEXITY: Moderate    PLAN: OT FREQUENCY: 2x/week  OT DURATION: 12 weeks +eval  PLANNED INTERVENTIONS: self care/ADL training, therapeutic exercise, therapeutic activity, neuromuscular re-education, manual therapy, passive range of motion, balance training, functional mobility training, aquatic therapy, splinting, electrical stimulation, ultrasound, paraffin, fluidotherapy, moist heat, cryotherapy, patient/family education, cognitive remediation/compensation, visual/perceptual remediation/compensation, energy conservation, and DME and/or AE instructions  RECOMMENDED OTHER SERVICES: none at this time  CONSULTED AND AGREED WITH PLAN OF CARE: Patient  PLAN FOR NEXT SESSION:  continue neuro re-ed RUE, in hand manipulation, functional reaching RUE,    KClaiborne Billings  Viviano [place], OTR/L 02/16/2022, 8:32 AM

## 2022-02-16 NOTE — Telephone Encounter (Signed)
Sent phone note about medications

## 2022-02-16 NOTE — Therapy (Signed)
OUTPATIENT PHYSICAL THERAPY TREATMENT NOTE   Patient Name: Denise Herrera MRN: [id number] DOB:[birth year], 51 y.o., female Today's Date: 02/16/2022  PCP: Antony Contras, MD REFERRING PROVIDER: Cathlyn Parsons, PA-C  PHYSICAL THERAPY DISCHARGE SUMMARY  Visits from Start of Care: 19  Current functional level related to goals / functional outcomes: See clinical impression statement.   Remaining deficits: RLE extensor tone limiting fluidity of gait   Education / Equipment: D/C plan, follow-up with MD about medication management of spasticity and return with new referral if things have changed, continue walking/HEP, safety with jogging.   Patient agrees to discharge. Patient goals were partially met. Patient is being discharged due to maximized rehab potential.    END OF SESSION:   PT End of Session - 02/16/22 0857     Visit Number 19    Number of Visits 21   13+8   Date for PT Re-Evaluation 33/82/50   re-cert   Authorization Type BLUE CROSS BLUE [employer]IELD    PT Start Time 0848    PT Stop Time 0928    PT Time Calculation (min) 40 min    Equipment Utilized During Treatment Gait belt    Activity Tolerance Patient tolerated treatment well    Behavior During Therapy WFL for tasks assessed/performed              Past Medical History:  Diagnosis Date   Chronic insomnia    History of migraine headaches    Narcolepsy without cataplexy(347.00)    Past Surgical History:  Procedure Laterality Date   CESAREAN SECTION     gyn cervical procedure     Patient Active Problem List   Diagnosis Date Noted   ICH (intracerebral hemorrhage) ([place]) 11/03/2021   Essential hypertension 09/05/2015   INSOMNIA, CHRONIC 03/01/2007   MIGRAINE HEADACHE 03/01/2007   Narcolepsy without cataplexy 03/01/2007    REFERRING DIAG: I61.0 (ICD-10-CM) - Nontraumatic subcortical hemorrhage of left cerebral hemisphere ([place])   THERAPY DIAG:  Hemiplegia and hemiparesis following nontraumatic  intracerebral hemorrhage affecting right dominant side (HCC)  Other lack of coordination  Muscle weakness (generalized)  Unsteadiness on feet  Rationale for Evaluation and Treatment Rehabilitation  PERTINENT HISTORY: ADHD, migraines, HTN, hyperlipidemia   Presented 11/05/2021 with acute onset of right-sided weakness and aphasia.  Left CVA/ICH confirmed. Admitted to rehab on 11/08/2021 and discharged 11/29/2021.  PRECAUTIONS: Fall  SUBJECTIVE: She has been walking up and down the hills by her house and jogging a little bit without issue.  She still feels her right leg is stiff.  PAIN:  Are you having pain? No-tightness in right heel cord, not painful.  VITALS: Before session (LUE): Today's Vitals   02/16/22 0854  BP: 125/73  Pulse: 66   TODAY'S TREATMENT: Assessed LTGs: -PT set up and had pt complete FOTO.  Discussed purpose of survey and progress with score from 68% to 74%. -10MWT:  9.71 sec no AD or AFO = 1.03 m/sec OR 3.40 ft/sec  STAIRS:  Level of Assistance: SBA  Stair Negotiation Technique: Alternating Pattern  Forwards with No Rails  Number of Stairs: 16   Height of Stairs: 6"  Comments: Pt maintains steady pace throughout task.  No overt LOB or imbalance noted.  GAIT: Gait pattern: step through pattern, decreased ankle dorsiflexion- Right, and poor foot clearance- Right Distance walked: 500' (outdoors over [employer] and grass) + 561' (indoors over level indoor surface) Assistive device utilized: None Level of assistance: Complete Independence Comments: Pt demonstrates light jog on sidewalk as  she has been doing at home with improved foot clearance due to engagement of hip and knee flexors and adequate initial contact for safety with activity.  She also uses a manageable speed that does not over-exert her.  Brief discussion of safety with activity.  She continues to have extensor tone evident in her walking w/ ER posture of LE during limb  advancement.   PATIENT EDUCATION: Education details:  Continue HEP and progressing walking program.  Discussed spasticity management options w/ encouragement for pt to talk to MD about medications if she desires at appts in October.  Pt is experiencing tightness in the right heel cord, she is stretching on a regular basis. Person educated: Patient Education method: Explanation Education comprehension: verbalized understanding   HOME EXERCISE PROGRAM: Access Code: [facility] URL: [email or web address] Date: 12/30/2021 Prepared by: Elease Etienne  Exercises - Standing Terminal Knee Extension with Resistance  - 1 x daily - 7 x weekly - 3 sets - 10 reps - Forward Step Up with Counter Support  - 1 x daily - 7 x weekly - 3 sets - 10 reps - Standing Single Leg Stance with Counter Support  - 1 x daily - 7 x weekly - 3 sets - 10 reps - Squat  - 1 x daily - 7 x weekly - 3 sets - 10 reps - Side Stepping with Resistance at Thighs  - 1 x daily - 7 x weekly - 3 sets - 10 reps (green theraband) - Forward and Ba/ckward Monster Walk with Counter Support  - 1 x daily - 7 x weekly - 3 sets - 10 reps (green theraband) - Sit to Stand with Armchair (Wedge Under L Foot) - 1 x daily - 7 x weekly - 3 sets - 10 reps  You Can Walk For A Certain Length Of Time Each Day                          Walk 10 minutes 2-3 times per day.             Increase 3-5  minutes every 7 days              Work up to 30 minutes (1-2 times per day).               Example:                         Day 1-2           4-5 minutes     3 times per day                         Day 7-8           10-12 minutes 2-3 times per day                         Day 13-14       20-22 minutes 1-2 times per day  NEW GOALS: Goals reviewed with patient? Yes SHORT TERM GOALS=LONG TERM GOALS: Target date: 02/20/2022  Pt will increase FOTO score to >/=75% in order to demonstrate subjective functional improvement. Baseline: 68%; 01/23/2022  68%; 02/16/2022 74% Goal status: PARTIALLY MET  2.  Pt will navigate 16 stairs using no rails supervision level reciprocally to improve safe functional mobility in home environment. Baseline: 4 step-to leaning against half wall/rail;  01/23/2022 16 step-to w/o leaning against rail S*; 02/16/2022 16 no rail reciprocally Goal status: MET  3.  Pt will ambulate >/=1000 feet with LRAD and ModI level of assist over various indoor and outdoor surfaces demonstrating improved right quad control and hip flexion during swing phase to promote safe household and community access. Baseline: 254' SBA RW; 01/23/2022 845' no AD SBA-CGA; 02/16/2022 1061' no AD independent level indoor/outdoor no knee hyperextension and some maintained ER at hip limiting hip flexion Goal status: PARTIALLY MET  4.  Pt will demonstrate a gait speed of >/=3.3 feet/sec in order to decrease risk for falls. Baseline: 1.90 ft/sec; 01/23/2022 3.03 ft/sec; 02/16/2022 3.40 ft/sec Goal status: MET  ASSESSMENT:  CLINICAL IMPRESSION: Assessed LTGs this visit with pt appropriate for and in agreement to discharge at this time.  She almost met her FOTO goal at 74% which is 1% shy of goal level.  Her management of stairs is much improved to performing 16 stairs w/o a rail reciprocally in a safe and steady manner making her safe for the staircase in her home.  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  She continues to have some mild right hemibody spasticity that she was encouraged to discuss with her MD and return if this continues to impact her gait following medication management.  OBJECTIVE IMPAIRMENTS Abnormal gait, decreased balance, decreased cognition, decreased endurance, and decreased strength.   ACTIVITY LIMITATIONS carrying, lifting, and locomotion level  PARTICIPATION LIMITATIONS: driving, community  activity, occupation, and yard work  PERSONAL FACTORS Age, Fitness, Transportation, and 1 comorbidity: HTN are also affecting patient's functional outcome.   REHAB POTENTIAL: Excellent  CLINICAL DECISION MAKING: Stable/uncomplicated  EVALUATION COMPLEXITY: Low  PLAN: PT FREQUENCY: 2x/week  PT DURATION: 4 weeks  PLANNED INTERVENTIONS: Therapeutic exercises, Therapeutic activity, Neuromuscular re-education, Balance training, Gait training, Patient/Family education, Self Care, Stair training, Vestibular training, DME instructions, Electrical stimulation, Biofeedback, and Re-evaluation  PLAN FOR NEXT SESSION:  N/A    Elease Etienne, PT, DPT Outpatient Neuro [facility] [address], [place], [place] [address] [phone] 02/16/22, 9:29 AM

## 2022-02-16 NOTE — Telephone Encounter (Signed)
Janett Billow advised on the 01/2022 visit f the patient wanted medication for spasticity to just let her know. The patient uses [facility] [address]. Any question pease all to the patient the patient.

## 2022-02-17 ENCOUNTER — Telehealth: Payer: Self-pay | Admitting: Internal Medicine

## 2022-02-17 ENCOUNTER — Other Ambulatory Visit: Payer: Self-pay | Admitting: Internal Medicine

## 2022-02-17 MED ORDER — METHYLPHENIDATE HCL ER 20 MG PO TBCR: EXTENDED_RELEASE_TABLET | ORAL | 0 refills | Status: DC

## 2022-02-17 NOTE — Telephone Encounter (Signed)
Methylphenidate refilled 

## 2022-02-18 MED ORDER — BACLOFEN 5 MG PO TABS: 10.0000 mg | ORAL_TABLET | Freq: Three times a day (TID) | ORAL | 5 refills | Status: DC | PRN

## 2022-02-18 MED ORDER — METHYLPHENIDATE HCL ER 20 MG PO TBCR: EXTENDED_RELEASE_TABLET | ORAL | 0 refills | Status: DC

## 2022-02-18 NOTE — Telephone Encounter (Signed)
Recommend trialing baclofen 5 mg 3 times daily as needed for poststroke spasticity. Would recommend starting with just night time dose as this medication can cause fatigue/drowsiness. Can gradually increase to morning and afternoon dosage if needed and as tolerated. Has f/u with PMR on 10/18, can further discuss treatment at that visit or need of dosage adjustment. Thank you.

## 2022-02-18 NOTE — Telephone Encounter (Signed)
Pt would like a call from nurse to discuss medication for spasticity.

## 2022-02-18 NOTE — Telephone Encounter (Signed)
I refilled her Metadate ER 20. Her Ritalin 10 mg was marked that she wanted it removed from list and she didn't have Ritalin 20 on hr list at all.

## 2022-02-18 NOTE — Telephone Encounter (Signed)
Contacted pt back, informed her that [place]   Recommend trialing baclofen 5 mg 3 times daily as needed for poststroke spasticity. Would recommend starting with just night time dose as this medication can cause fatigue/drowsiness. Can gradually increase to morning and afternoon dosage if needed and as tolerated. Has f/u with PMR on 10/18, can further discuss treatment at that visit or need of dosage adjustment. Thank you.       Number provided to call back with any questions as she had none at this time and was appreciative

## 2022-02-18 NOTE — Telephone Encounter (Signed)
Called and spoke with pt letting her know that [facility] refilled her methylphenidate 20mg  ER and she verbalized understanding. Nothing further needed.

## 2022-02-18 NOTE — Telephone Encounter (Signed)
We didn't receive the original call on 10/2, it wasn't routed to [place]. Pt is now interested in medication to help with spasticity. Per note 9/25 Right-sided ataxia with spasticity and gait impairment.  Continue working with PT/OT for hopeful ongoing recovery. I do not see anything regarding consideration for medication. Are you willing to prescribe something? Please advise

## 2022-02-18 NOTE — Telephone Encounter (Signed)
Dr. Annamaria Boots, please advise on refills of pt's methylphenidate and Ritalin 10mg  and 20mg .  Allergies  Allergen Reactions   Amlodipine Besylate     Dizziness, disequilibrium, fatigue Other reaction(s): Dizziness, disequilibrium and fatigue   Hydrochlorothiazide Other (See Comments)    Fatigue Laziness  Other reaction(s): fatigue/laziness   Influenza Vaccines     Other reaction(s): nausea and vomiting x 4-5 days   Lodine [Etodolac] Itching   Meloxicam     Other reaction(s): legs swell   Norvasc [Amlodipine] Other (See Comments)    Dizziness Disequilibrium Fatigue   Nuvigil [Armodafinil] Other (See Comments)    Headache    Vicodin [Hydrocodone-Acetaminophen] Itching   Lipitor [Atorvastatin] Other (See Comments)    Fatigue      Current Outpatient Medications:    acetaminophen (TYLENOL) 325 MG tablet, Take 2 tablets (650 mg total) by mouth every 4 (four) hours as needed for mild pain (or temp > 37.5 C (99.5 F))., Disp: , Rfl:    atorvastatin (LIPITOR) 20 MG tablet, Take 1 tablet (20 mg total) by mouth daily., Disp: 30 tablet, Rfl: 0   cetirizine (ZYRTEC) 10 MG tablet, Take 10 mg by mouth daily., Disp: , Rfl:    chlorthalidone (HYGROTON) 25 MG tablet, Take 25 mg by mouth daily., Disp: , Rfl:    irbesartan (AVAPRO) 300 MG tablet, Take 1 tablet (300 mg total) by mouth daily., Disp: 30 tablet, Rfl: 0   methylphenidate (METADATE ER) 20 MG ER tablet, Twice daily as needed, Disp: 60 tablet, Rfl: 0   methylphenidate (RITALIN) 10 MG tablet, Take 10 mg by mouth 3 (three) times daily., Disp: , Rfl:    nebivolol (BYSTOLIC) 5 MG tablet, Take 1 tablet (5 mg total) by mouth daily., Disp: 30 tablet, Rfl: 0   pantoprazole (PROTONIX) 40 MG tablet, Take 1 tablet (40 mg total) by mouth daily., Disp: 30 tablet, Rfl: 0   triamcinolone (NASACORT) 55 MCG/ACT AERO nasal inhaler, Place 2 sprays into the nose daily as needed (allergies)., Disp: , Rfl:    zolpidem (AMBIEN CR) 12.5 MG CR tablet, Take 12.5 mg by  mouth at bedtime as needed., Disp: , Rfl:

## 2022-02-18 NOTE — Addendum Note (Signed)
Addended by: Frann Rider L on: 02/18/2022 01:10 PM   Modules accepted: Orders

## 2022-02-19 ENCOUNTER — Ambulatory Visit: Payer: BLUE CROSS/BLUE SHIELD | Admitting: Occupational Therapy

## 2022-02-19 ENCOUNTER — Ambulatory Visit: Payer: BLUE CROSS/BLUE SHIELD | Admitting: Physical Therapy

## 2022-02-19 DIAGNOSIS — R2689 Other abnormalities of gait and mobility: Secondary | ICD-10-CM | POA: Diagnosis not present

## 2022-02-19 DIAGNOSIS — R208 Other disturbances of skin sensation: Secondary | ICD-10-CM

## 2022-02-19 DIAGNOSIS — R41842 Visuospatial deficit: Secondary | ICD-10-CM

## 2022-02-19 DIAGNOSIS — I69153 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: Secondary | ICD-10-CM

## 2022-02-19 DIAGNOSIS — R278 Other lack of coordination: Secondary | ICD-10-CM | POA: Diagnosis not present

## 2022-02-19 DIAGNOSIS — R2681 Unsteadiness on feet: Secondary | ICD-10-CM

## 2022-02-19 DIAGNOSIS — I69151 Hemiplegia and hemiparesis following nontraumatic intracerebral hemorrhage affecting right dominant side: Secondary | ICD-10-CM

## 2022-02-19 DIAGNOSIS — M6281 Muscle weakness (generalized): Secondary | ICD-10-CM

## 2022-02-19 NOTE — Therapy (Signed)
OUTPATIENT OCCUPATIONAL THERAPY NEURO Treatment  Patient Name: Denise Herrera MRN: [id number] DOB:[birth year], 51 y.o., female Today's Date: 02/19/2022  PCP: Dr. Antony Contras REFERRING PROVIDER: Cathlyn Parsons, PA-C    OT End of Session - 02/19/22 1155     Visit Number 13    Number of Visits 25    Date for OT Re-Evaluation 03/11/22    Authorization Type BCBS, covered 100% (OOP met), visit limit--[place]    OT Start Time 0935    OT Stop Time 1014    OT Time Calculation (min) 39 min    Activity Tolerance Patient tolerated treatment well    Behavior During Therapy WFL for tasks assessed/performed                   Past Medical History:  Diagnosis Date   Chronic insomnia    History of migraine headaches    Narcolepsy without cataplexy(347.00)    Past Surgical History:  Procedure Laterality Date   CESAREAN SECTION     gyn cervical procedure     Patient Active Problem List   Diagnosis Date Noted   ICH (intracerebral hemorrhage) ([place]) 11/03/2021   Essential hypertension 09/05/2015   INSOMNIA, CHRONIC 03/01/2007   MIGRAINE HEADACHE 03/01/2007   Narcolepsy without cataplexy 03/01/2007    ONSET DATE: 11/05/21  REFERRING DIAG: I61.0 (ICD-10-CM) - Nontraumatic subcortical hemorrhage of left cerebral hemisphere ([place])   THERAPY DIAG:  Hemiplegia and hemiparesis following nontraumatic intracerebral hemorrhage affecting right dominant side (HCC)  Other lack of coordination  Muscle weakness (generalized)  Unsteadiness on feet  Other disturbances of skin sensation  Visuospatial deficit  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 Blood pressure mildly elevated  141/85. CT of the head showed acute parenchymal hemorrhage in the left corona radiata with intraventricular extension.  Hospital d/c 11/28/21.  PMH:  history of ADHD maintained on Ritalin, migraine headaches, hypertension, hyperlipidemia, hx of narcolepsy without cataplex, Hx of R RTC repair 03/2021.     PRECAUTIONS: Fall  WEIGHT BEARING RESTRICTIONS No  PAIN:  Are you having pain? No, FALLS: Has patient fallen in last 6 months? 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  Rolling medium ball up the wall with bilateral UE's for shoulder flexion, then unilateral wall circles with RUE.  Copying small peg design with RUE for increased fine motor coordination, min difficulty and min v.c for correct design, increased time required.   HOME EXERCISE PROGRAM: 8/18/23coordination HEP 01/07/22: Shoulder HEP  01/21/22: Updated to HEP for proximal strengthening   GOALS: Potential Goals reviewed with patient? Yes  SHORT TERM GOALS: Target date: 01/23/22- extended due to schedule  Pt will be independent with initial HEP. Goal status: MET  2.  Pt will improve dominant RUE coordination/functional reaching for ADLs as shown by improving  score on box and blocks test by at least 8. Baseline: 34 blocks Goal status: IN PROGRESS (01/29/22: 40 blocks)  3.  Pt will use RUE as dominant UE for eating and grooming 100% of the time. Goal status: IN PROGRESS (Eating 100%, grooming 50%)   4.  Pt will perform simple home maintenance  tasks with supervision. Goal status: MET  5.  Pt will perform environmental scanning/navigation with at least 95% accuracy in busy environment. Goal status: IN PROGRESS  6.  Pt will be able to write at least 2 sentences with good legibility in reasonable amount of time. Goal status: MET  LONG TERM GOALS: Target date: 03/11/22  Pt will be independent with updated HEP Goal status: IN PROGRESS  2.  Pt will improve R hand coordination for ADLs as shown by completing 9-hole peg test in less than 90sec. Baseline: only able to put in 1 peg within 69mn Goal status: IN PROGRESS  3.  Pt will use RUE as dominant UE for eating and grooming at least 75% of the time. Goal status: INITIAL  4.  Pt will perform simple-mod complex home maintenance tasks mod I. Goal status: INITIAL  5.  Pt will improve RUE strength to be be able to retrieve 3lb object from overhead shelf safely x3. Goal status: INITIAL  6.  Pt will improve R grip strength to at least 30lbs to assist in lifting objects/opening containers. Goal status: INITIAL  7.  Pt will perform environmental scanning/navigation in busy environment with at least 95% accuracy for incr safety.   Goal status: INITIAL  ASSESSMENT:  CLINICAL IMPRESSION: Pt progressing towards goals. She reports starting baclophen last night and her arm and leg feel much better.  PERFORMANCE DEFICITS in functional skills including ADLs, IADLs, coordination, dexterity, sensation, ROM, strength, FMC, GMC, mobility, balance, decreased knowledge of precautions, decreased knowledge of use of DME, vision, and UE functional use, cognitive skills including memory, and psychosocial skills including habits.   IMPAIRMENTS are limiting patient from ADLs, IADLs, work, leisure, and social participation.   COMORBIDITIES may have co-morbidities  that affects occupational performance. Patient will benefit from skilled OT to address above impairments and improve overall  function.  MODIFICATION OR ASSISTANCE TO COMPLETE EVALUATION: Min-Moderate modification of tasks or assist with assess necessary to complete an evaluation.  OT OCCUPATIONAL PROFILE AND HISTORY: Detailed assessment: Review of records and additional review of physical, cognitive, psychosocial history related to current functional performance.  CLINICAL DECISION MAKING: Moderate - several treatment options, min-mod task modification necessary  REHAB POTENTIAL: Good  EVALUATION COMPLEXITY: Moderate    PLAN: OT FREQUENCY: 2x/week  OT DURATION: 12 weeks +eval  PLANNED INTERVENTIONS: self care/ADL training, therapeutic exercise, therapeutic activity, neuromuscular re-education, manual therapy, passive range of motion, balance training, functional mobility training, aquatic therapy, splinting, electrical stimulation, ultrasound, paraffin, fluidotherapy, moist heat, cryotherapy, patient/family education, cognitive remediation/compensation, visual/perceptual remediation/compensation, energy conservation, and DME and/or AE instructions  RECOMMENDED OTHER SERVICES: none at this time  CONSULTED AND AGREED WITH PLAN OF CARE: Patient  PLAN FOR NEXT SESSION:  continue neuro re-ed RUE, in hand manipulation, functional reaching RUE,    Daine Croker, OTR/L 02/19/2022, 11:56 AM

## 2022-02-25 ENCOUNTER — Encounter: Payer: Self-pay | Admitting: Occupational Therapy

## 2022-02-25 ENCOUNTER — Encounter: Payer: Self-pay | Admitting: Physical Medicine & Rehabilitation

## 2022-02-25 ENCOUNTER — Ambulatory Visit: Payer: BLUE CROSS/BLUE SHIELD | Admitting: Occupational Therapy

## 2022-02-25 ENCOUNTER — Encounter: Payer: BLUE CROSS/BLUE SHIELD | Attending: Registered Nurse | Admitting: Physical Medicine & Rehabilitation

## 2022-02-25 VITALS — BP 160/85 | HR 84 | Ht 64.0 in | Wt 172.0 lb

## 2022-02-25 DIAGNOSIS — I10 Essential (primary) hypertension: Secondary | ICD-10-CM | POA: Diagnosis not present

## 2022-02-25 DIAGNOSIS — M6281 Muscle weakness (generalized): Secondary | ICD-10-CM

## 2022-02-25 DIAGNOSIS — R208 Other disturbances of skin sensation: Secondary | ICD-10-CM | POA: Diagnosis not present

## 2022-02-25 DIAGNOSIS — R2689 Other abnormalities of gait and mobility: Secondary | ICD-10-CM | POA: Diagnosis not present

## 2022-02-25 DIAGNOSIS — I69151 Hemiplegia and hemiparesis following nontraumatic intracerebral hemorrhage affecting right dominant side: Secondary | ICD-10-CM | POA: Diagnosis not present

## 2022-02-25 DIAGNOSIS — R2681 Unsteadiness on feet: Secondary | ICD-10-CM | POA: Diagnosis not present

## 2022-02-25 DIAGNOSIS — I69153 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: Secondary | ICD-10-CM | POA: Diagnosis not present

## 2022-02-25 DIAGNOSIS — I610 Nontraumatic intracerebral hemorrhage in hemisphere, subcortical: Secondary | ICD-10-CM | POA: Diagnosis not present

## 2022-02-25 DIAGNOSIS — R278 Other lack of coordination: Secondary | ICD-10-CM

## 2022-02-25 DIAGNOSIS — R41842 Visuospatial deficit: Secondary | ICD-10-CM | POA: Diagnosis not present

## 2022-02-25 NOTE — Progress Notes (Signed)
Subjective:    Patient ID: Denise Herrera, female    DOB: 04-04-1971, 51 y.o.   MRN: [id number] HPI  Denise Herrera is here in folllow up of her left thalamic hemorrhage. She has gone through extensive therapy. She is seeing PT and OT but OT will be discharging her soon.   She still is having issues with her balance and gait although she is stable without a device. No falls.  She walks with a bit of a wide based gait. She also feels that right arm is tight. Both right arm and leg are numb and tingly which can come and go.   Headaches are gone. Bowels and bladder are normal. She takes [place] for sleep which works for   She worked as a Training and development officer (compression stockings). She works the day time usually. She's on her feet, at a desk, sometimes in the field when they're short people. Her boss is anxious for her to get back to work.    HPI Pain Inventory Average Pain 0 Pain Right Now 0 My pain is  no pain  In the last 24 hours, has pain interfered with the following? General activity 0 Relation with others 0 Enjoyment of life 0 What TIME of day is your pain at its worst? na Sleep (in general) Good  Pain is worse with:  no pain Pain improves with:  no pain Relief from Meds:  na  Family History  Problem Relation Age of Onset   Asthma Mother    Hypertension Mother    Cancer Mother        breast cancer   Hypertension Maternal Grandfather    Diabetes Maternal Grandfather    Hypertension Maternal Grandmother    Social History   Socioeconomic History   Marital status: Married    Spouse name: Not on file   Number of children: 3   Years of education: Not on file   Highest education level: Not on file  Occupational History   Occupation: makes medical hose and prostheses  Tobacco Use   Smoking status: Never   Smokeless tobacco: Never  Substance and Sexual Activity   Alcohol use: No    Alcohol/week: 0.0 standard drinks of alcohol   Drug use: No   Sexual  activity: Not on file  Other Topics Concern   Not on file  Social History Narrative   Not on file   Social Determinants of Health   Financial Resource Strain: Not on file  Food Insecurity: Not on file  Transportation Needs: Not on file  Physical Activity: Not on file  Stress: Not on file  Social Connections: Not on file   Past Surgical History:  Procedure Laterality Date   CESAREAN SECTION     gyn cervical procedure     Past Surgical History:  Procedure Laterality Date   CESAREAN SECTION     gyn cervical procedure     Past Medical History:  Diagnosis Date   Chronic insomnia    History of migraine headaches    Narcolepsy without cataplexy(347.00)    BP (!) 160/85   Pulse 84   Ht 5\' 4"  (1.626 m)   Wt 172 lb (78 kg)   SpO2 96%   BMI 29.52 kg/m   Opioid Risk Score:   Fall Risk Score:  `1  Depression screen [facility] 2/9     02/09/2022   10:01 AM 12/17/2021    2:24 PM  Depression screen PHQ 2/9  Decreased  Interest 0 0  Down, Depressed, Hopeless 0 0  PHQ - 2 Score 0 0  Altered sleeping  0  Tired, decreased energy  0  Change in appetite  0  Feeling bad or failure about yourself   0  Trouble concentrating  0  Moving slowly or fidgety/restless  0  Suicidal thoughts  0  PHQ-9 Score  0      Review of Systems  All other systems reviewed and are negative.     Objective:   Physical Exam Gen: no distress, normal appearing HEENT: oral mucosa pink and moist, NCAT Cardio: Reg rate Chest: normal effort, normal rate of breathing Abd: soft, non-distended Ext: no edema Psych: pleasant, normal affect Skin: intact Neuro: Alert and oriented x 3. Normal insight and awareness. Intact Memory. Normal language and speech. Cranial nerve exam unremarkable except for mild right facial numbness. RUE grossly 4+/5 prox to distal. RLE 4 to 4+/5 prox to distal. Sensory 1/2 RUE and RLE for fine touch and pain. Mild right PD. Negative romberg test. Walks with wide based gait. Has  difficulty coordinating right leg during swing which produces a non-fluid movement. No resting tone. DTR's 2+ Musculoskeletal: normal rom.   Good standing and sitting posture       Assessment & Plan:  Medical Problem List and Plan: 1. Functional deficits secondary to left thalamic ICH likely secondary to hypertensive crisis             -She has made nice gains  -still has significant right hemi-sensory loss but is walking without a device.   -continue with outpt therapies to completion  -gave her instructions for a return to driving program. She will complete the steps with her husband  -she cannot return to her job yet given the walking demands as well as some of the physical exams required. I completed extensive paperwork to that effect. I kept her out another 3 months with the goal of increasing balance and coordination. When she returns to work it will need to be at a sedentary level 2.   Pain Management: Tylenol as needed             -controlled, headaches resolved 3. Mood/Sleep/ADHD: metadate per primary 4. HTN: has been controlled. Continue per primary   Thirty minutes of face to face patient care time were spent during this visit. All questions were encouraged and answered. Follow up with me in 3 mos.

## 2022-02-25 NOTE — Therapy (Signed)
OUTPATIENT OCCUPATIONAL THERAPY NEURO Treatment  Patient Name: Denise Herrera MRN: [id number] DOB:06-20-1970, 51 y.o., female Today's Date: 02/25/2022  PCP: Dr. Antony Contras REFERRING PROVIDER: Cathlyn Parsons, PA-C    OT End of Session - 02/25/22 0807     Visit Number 14    Number of Visits 25    Date for OT Re-Evaluation 03/11/22    Authorization Type BCBS, covered 100% (OOP met), visit limit--[place]    OT Start Time 0805    OT Stop Time 0845    OT Time Calculation (min) 40 min    Activity Tolerance Patient tolerated treatment well    Behavior During Therapy WFL for tasks assessed/performed                   Past Medical History:  Diagnosis Date   Chronic insomnia    History of migraine headaches    Narcolepsy without cataplexy(347.00)    Past Surgical History:  Procedure Laterality Date   CESAREAN SECTION     gyn cervical procedure     Patient Active Problem List   Diagnosis Date Noted   ICH (intracerebral hemorrhage) ([place]) 11/03/2021   Essential hypertension 09/05/2015   INSOMNIA, CHRONIC 03/01/2007   MIGRAINE HEADACHE 03/01/2007   Narcolepsy without cataplexy 03/01/2007    ONSET DATE: 11/05/21  REFERRING DIAG: I61.0 (ICD-10-CM) - Nontraumatic subcortical hemorrhage of left cerebral hemisphere ([place])   THERAPY DIAG:  Hemiplegia and hemiparesis following nontraumatic intracerebral hemorrhage affecting right dominant side (HCC)  Other lack of coordination  Muscle weakness (generalized)  Rationale for Evaluation and Treatment Rehabilitation  SUBJECTIVE:   SUBJECTIVE STATEMENT: I drop things in my Rt hand. I can't feel it  PERTINENT HISTORY: 11/05/2021 with acute onset of right-sided weakness and aphasia. Blood pressure mildly elevated 141/85. CT of the head showed acute parenchymal hemorrhage in the left corona radiata with intraventricular extension.  Hospital d/c 11/28/21.  PMH:  history of ADHD maintained on Ritalin, migraine headaches,  hypertension, hyperlipidemia, hx of narcolepsy without cataplex, Hx of R RTC repair 03/2021.     PRECAUTIONS: Fall  WEIGHT BEARING RESTRICTIONS No  PAIN:  Are you having pain? No,  FALLS: Has patient fallen in last 6 months? 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 Reviewed wear and care  Closed chain high level RUE sh flexion using UE Ranger with min tactile cues for scapulohumeral rhythm. Wall push ups w/ min cues to prevent sh. Hiking. Pt instructed not to do at home. Reviewed chair push ups for scapula depression and triceps activation. Pt instructed to continue chair push ups at home.   Functional open chain mid level reaching to place medium sized pegs in pegboard (on vertical surface) manipulating up to 3 pegs at a time w/ mod difficulty, min drops   HOME EXERCISE PROGRAM: 8/18/23coordination HEP 01/07/22: Shoulder HEP  01/21/22: Updated to HEP for proximal strengthening   GOALS: Potential Goals reviewed with patient? Yes  SHORT TERM GOALS: Target date: 01/23/22- extended due to schedule  Pt will  be independent with initial HEP. Goal status: MET  2.  Pt will improve dominant RUE coordination/functional reaching for ADLs as shown by improving score on box and blocks test by at least 8. Baseline: 34 blocks Goal status: IN PROGRESS (01/29/22: 40 blocks)  3.  Pt will use RUE as dominant UE for eating and grooming 50% of the time. Goal status: MET (Eating 100%, grooming 100%)   4.  Pt will perform  simple home maintenance tasks with supervision. Goal status: MET  5.  Pt will perform environmental scanning/navigation with at least 95% accuracy in busy environment. Goal status: IN PROGRESS  6.  Pt will be able to write at least 2 sentences with good legibility in reasonable amount of time. Goal status: MET  LONG TERM GOALS: Target date: 03/11/22  Pt will be independent with updated HEP Goal status: IN PROGRESS  2.  Pt will improve R hand coordination for ADLs as shown by completing 9-hole peg test in less than 90sec. Baseline: only able to put in 1 peg within 58mn Goal status: IN PROGRESS  3.  Pt will use RUE as dominant UE for eating and grooming at least 75% of the time. Goal status: MET  4.  Pt will perform simple-mod complex home maintenance tasks mod I. Goal status: INITIAL  5.  Pt will improve RUE strength to be be able to retrieve 3lb object from overhead shelf safely x3. Goal status: INITIAL  6.  Pt will improve R grip strength to at least 30lbs to assist in lifting objects/opening containers. Goal status: MET (31.7 LBS)   7.  Pt will perform environmental scanning/navigation in busy environment with at least 95% accuracy for incr safety.   Goal status: INITIAL  ASSESSMENT:  CLINICAL IMPRESSION: Pt progressing towards goals. Pt reports she will return to work in Dec  [place] functional skills including ADLs, IADLs, coordination, dexterity, sensation, ROM, strength, FMC, GMC, mobility, balance, decreased knowledge of precautions, decreased knowledge of use of DME, vision, and UE functional use, cognitive skills including memory, and psychosocial skills including habits.   IMPAIRMENTS are limiting patient from ADLs, IADLs, work, leisure, and social participation.   COMORBIDITIES may have co-morbidities  that affects occupational performance. Patient will benefit from skilled OT to address above impairments and improve overall  function.  MODIFICATION OR ASSISTANCE TO COMPLETE EVALUATION: Min-Moderate modification of tasks or assist with assess necessary to complete an evaluation.  OT OCCUPATIONAL PROFILE AND HISTORY: Detailed assessment: Review of records and additional review of physical, cognitive, psychosocial history related to current functional performance.  CLINICAL DECISION MAKING: Moderate - several treatment options, min-mod task modification necessary  REHAB POTENTIAL: Good  EVALUATION COMPLEXITY: Moderate    PLAN: OT FREQUENCY: 2x/week  OT DURATION: 12 weeks +eval  PLANNED INTERVENTIONS: self care/ADL training, therapeutic exercise, therapeutic activity, neuromuscular re-education, manual therapy, passive range of motion, balance training, functional mobility training, aquatic therapy, splinting, electrical stimulation, ultrasound, paraffin, fluidotherapy, moist heat, cryotherapy, patient/family education, cognitive remediation/compensation, visual/perceptual remediation/compensation, energy conservation, and DME and/or AE instructions  RECOMMENDED OTHER SERVICES: none at this time  CONSULTED AND AGREED WITH PLAN OF CARE: Patient  PLAN FOR NEXT SESSION:  check taping, continue neuro re-ed RUE, in hand manipulation, functional reaching RUE, consider red resistance putty   KHans Eden OTR/L 02/25/2022, 8:08 AM

## 2022-02-25 NOTE — Patient Instructions (Addendum)
ALWAYS FEEL FREE TO CALL OUR OFFICE WITH ANY PROBLEMS OR QUESTIONS ([phone])  **PLEASE NOTE** ALL MEDICATION REFILL REQUESTS (INCLUDING CONTROLLED SUBSTANCES) NEED TO BE MADE AT LEAST 7 DAYS PRIOR TO REFILL BEING DUE. ANY REFILL REQUESTS INSIDE THAT TIME FRAME MAY RESULT IN DELAYS IN RECEIVING YOUR PRESCRIPTION.      RETURN TO DRIVING PLAN:  WITH THE SUPERVISION OF A LICENSED DRIVER, PLEASE DRIVE IN AN EMPTY PARKING LOT FOR AT LEAST 2-3 TRIALS TO TEST REACTION TIME, VISION, USE OF EQUIPMENT IN CAR, ETC.  IF SUCCESSFUL WITH THE PARKING LOT DRIVING, PROCEED TO SUPERVISED DRIVING TRIALS IN YOUR NEIGHBORHOOD STREETS AT LOW TRAFFIC TIMES TO TEST OBSERVATION TO TRAFFIC SIGNALS, REACTION TIME, ETC. PLEASE ATTEMPT AT LEAST 2-3 TRIALS IN YOUR NEIGHBORHOOD.  IF NEIGHBORHOOD DRIVING IS SUCCESSFUL, YOU MAY PROCEED TO DRIVING IN BUSIER AREAS IN YOUR COMMUNITY WITH SUPERVISION OF A LICENSED DRIVER. PLEASE ATTEMPT AT LEAST 4-5 TRIALS.  IF COMMUNITY DRIVING IS SUCCESSFUL, YOU MAY PROCEED TO DRIVING ALONE, DURING THE DAY TIME, IN NON-PEAK TRAFFIC TIMES. YOU SHOULD DRIVE NO FURTHER THAN 25 MINUTES IN ONE DIRECTION. PLEASE DO NOT DRIVE IF YOU FEEL FATIGUED OR UNDER THE INFLUENCE OF MEDICATION.

## 2022-02-27 ENCOUNTER — Other Ambulatory Visit: Payer: Self-pay | Admitting: Internal Medicine

## 2022-02-27 MED ORDER — METHYLPHENIDATE HCL 5 MG PO TABS: ORAL_TABLET | ORAL | 0 refills | Status: DC

## 2022-02-27 NOTE — Telephone Encounter (Signed)
She had been getting ritalin 5 mg so I refilled that.

## 2022-02-27 NOTE — Telephone Encounter (Signed)
Dr. Annamaria Boots, please see the new message from pt about her accidentally marking off the ritalin 10mg . Please advise if you are okay sending this Rx to pharmacy for pt.  Allergies  Allergen Reactions   Amlodipine Besylate     Dizziness, disequilibrium, fatigue Other reaction(s): Dizziness, disequilibrium and fatigue   Hydrochlorothiazide Other (See Comments)    Fatigue Laziness  Other reaction(s): fatigue/laziness   Influenza Vaccines     Other reaction(s): nausea and vomiting x 4-5 days   Lodine [Etodolac] Itching   Meloxicam     Other reaction(s): legs swell   Norvasc [Amlodipine] Other (See Comments)    Dizziness Disequilibrium Fatigue   Nuvigil [Armodafinil] Other (See Comments)    Headache    Vicodin [Hydrocodone-Acetaminophen] Itching   Lipitor [Atorvastatin] Other (See Comments)    Fatigue      Current Outpatient Medications:    acetaminophen (TYLENOL) 325 MG tablet, Take 2 tablets (650 mg total) by mouth every 4 (four) hours as needed for mild pain (or temp > 37.5 C (99.5 F))., Disp: , Rfl:    atorvastatin (LIPITOR) 20 MG tablet, Take 1 tablet (20 mg total) by mouth daily., Disp: 30 tablet, Rfl: 0   baclofen 5 MG TABS, Take 10 mg by mouth 3 (three) times daily as needed for muscle spasms., Disp: 90 tablet, Rfl: 5   cetirizine (ZYRTEC) 10 MG tablet, Take 10 mg by mouth daily., Disp: , Rfl:    chlorthalidone (HYGROTON) 25 MG tablet, Take 25 mg by mouth daily., Disp: , Rfl:    irbesartan (AVAPRO) 300 MG tablet, Take 1 tablet (300 mg total) by mouth daily., Disp: 30 tablet, Rfl: 0   methylphenidate (METADATE ER) 20 MG ER tablet, Twice daily as needed, Disp: 60 tablet, Rfl: 0   nebivolol (BYSTOLIC) 5 MG tablet, Take 1 tablet (5 mg total) by mouth daily., Disp: 30 tablet, Rfl: 0   pantoprazole (PROTONIX) 40 MG tablet, Take 1 tablet (40 mg total) by mouth daily., Disp: 30 tablet, Rfl: 0   triamcinolone (NASACORT) 55 MCG/ACT AERO nasal inhaler, Place 2 sprays into the nose daily as  needed (allergies)., Disp: , Rfl:    zolpidem (AMBIEN CR) 12.5 MG CR tablet, Take 12.5 mg by mouth at bedtime as needed., Disp: , Rfl:

## 2022-02-27 NOTE — Telephone Encounter (Signed)
Attempted to call pt to let her know that the Rx was sent but unable to reach. Left a detailed message for her on machine. Nothing further needed.

## 2022-02-28 DIAGNOSIS — I619 Nontraumatic intracerebral hemorrhage, unspecified: Secondary | ICD-10-CM | POA: Diagnosis not present

## 2022-02-28 NOTE — Progress Notes (Signed)
Subjective:    Patient ID: Denise Herrera, female    DOB: 1970/11/20, 51 y.o.   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 Still working.  She is pending shoulder surgery- Dr Ave Filter. Denies heart or lung problems and currently feels well.   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  No concerns about her stimulant medication reported.  She usually is taking 10 mg Ritalin 2 or 3 times daily.  Sometimes will take a 20 mg extended release if she is "active".  No seizures reported.  Napping most days.  Sleeps okay at night with Ambien.   Review of Systems- see HPI  + = positive Constitutional:   No-   weight loss, night sweats, fevers, chills, +fatigue, lassitude. HEENT:   +  headaches, no-difficulty swallowing, tooth/dental problems, sore throat,       No-  sneezing, itching, ear ache, nasal congestion, post nasal drip,  CV:  No-    chest pain, orthopnea, PND, swelling in lower extremities, anasarca, dizziness, palpitations Resp: No-   shortness of breath with exertion or at rest.              No-   productive cough,  No non-productive cough,  No-  coughing up of blood.              No-   change in color of mucus.  No- wheezing.   Skin: No-   rash or lesions.  GI:  No-   heartburn, indigestion, abdominal pain, nausea, vomiting,  GU: . MS:  No-   joint pain or swelling.   Neuro- nothing unusual Psych:  No- change in mood or affect. No depression or anxiety.  No memory loss    Objective:   Physical Exam General- Alert, Oriented, Affect-appropriate, Distress- none acute  Medium build. Appears well Skin- rash-none, lesions- none, excoriation- none; tatoos Lymphadenopathy- none Head- atraumatic            Eyes- Gross vision intact, PERRLA, conjunctivae clear secretions            Ears- Hearing, canals normal            Nose- Clear, NoSeptal dev, mucus, polyps, erosion, perforation             Throat- Mallampati II , mucosa clear , drainage- none, tonsils- atrophic Neck- flexible , trachea midline, no stridor , thyroid nl, carotid no bruit Chest - symmetrical excursion , unlabored           Heart/CV- RRR , no murmur , no gallop  , no rub,  nl s1 s2                           - JVD- none , edema- none, stasis changes- none, varices- none           Lung- clear to P&A, wheeze- none, cough- none , dullness-none, rub- none           Chest wall-  Abd-  Br/ Gen/ Rectal- Not done, not indicated Extrem- cyanosis- none, clubbing, none, atrophy- none, strength- nl Neuro- grossly intact to observation. No tremor. Using R&L hands. Speech clear. Assessment & Plan:

## 2022-03-02 ENCOUNTER — Ambulatory Visit (INDEPENDENT_AMBULATORY_CARE_PROVIDER_SITE_OTHER): Payer: BLUE CROSS/BLUE SHIELD | Admitting: Internal Medicine

## 2022-03-02 ENCOUNTER — Encounter: Payer: Self-pay | Admitting: Internal Medicine

## 2022-03-02 ENCOUNTER — Encounter: Payer: Self-pay | Admitting: Occupational Therapy

## 2022-03-02 ENCOUNTER — Ambulatory Visit: Payer: BLUE CROSS/BLUE SHIELD | Admitting: Occupational Therapy

## 2022-03-02 DIAGNOSIS — G4700 Insomnia, unspecified: Secondary | ICD-10-CM

## 2022-03-02 DIAGNOSIS — I69153 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: Secondary | ICD-10-CM | POA: Diagnosis not present

## 2022-03-02 DIAGNOSIS — R2681 Unsteadiness on feet: Secondary | ICD-10-CM | POA: Diagnosis not present

## 2022-03-02 DIAGNOSIS — M6281 Muscle weakness (generalized): Secondary | ICD-10-CM

## 2022-03-02 DIAGNOSIS — R208 Other disturbances of skin sensation: Secondary | ICD-10-CM | POA: Diagnosis not present

## 2022-03-02 DIAGNOSIS — I69151 Hemiplegia and hemiparesis following nontraumatic intracerebral hemorrhage affecting right dominant side: Secondary | ICD-10-CM

## 2022-03-02 DIAGNOSIS — G47419 Narcolepsy without cataplexy: Secondary | ICD-10-CM | POA: Diagnosis not present

## 2022-03-02 DIAGNOSIS — R278 Other lack of coordination: Secondary | ICD-10-CM

## 2022-03-02 DIAGNOSIS — R2689 Other abnormalities of gait and mobility: Secondary | ICD-10-CM | POA: Diagnosis not present

## 2022-03-02 DIAGNOSIS — R41842 Visuospatial deficit: Secondary | ICD-10-CM | POA: Diagnosis not present

## 2022-03-02 NOTE — Patient Instructions (Signed)
Glad you are recovering. Be conservative with your ritalin/ methylphenidate as discussed.  Please call if we can help

## 2022-03-02 NOTE — Patient Instructions (Addendum)
1. Grip Strengthening (Resistive Putty)   Squeeze putty using thumb and all fingers. Repeat _20___ times. Do __2__ sessions per day.   2. Roll putty into tube on table and pinch between first two fingers and thumb x 10 reps. Do 2 sessions per day     Copyright  VHI. All rights reserved.     

## 2022-03-02 NOTE — Therapy (Signed)
OUTPATIENT OCCUPATIONAL THERAPY NEURO Treatment  Patient Name: Denise Herrera MRN: [id number] DOB:1971/01/04, 51 y.o., female Today's Date: 03/02/2022  PCP: Dr. Antony Contras REFERRING PROVIDER: Cathlyn Parsons, PA-C    OT End of Session - 03/02/22 1436     Visit Number 15    Number of Visits 25    Date for OT Re-Evaluation 03/11/22    Authorization Type [employer], covered 100% (OOP met), visit limit--MN    OT Start Time 0850    OT Stop Time 0931    OT Time Calculation (min) 41 min    Activity Tolerance Patient tolerated treatment well    Behavior During Therapy WFL for tasks assessed/performed                    Past Medical History:  Diagnosis Date   Chronic insomnia    History of migraine headaches    Narcolepsy without cataplexy(347.00)    Past Surgical History:  Procedure Laterality Date   CESAREAN SECTION     gyn cervical procedure     Patient Active Problem List   Diagnosis Date Noted   ICH (intracerebral hemorrhage) ([place]) 11/03/2021   Essential hypertension 09/05/2015   INSOMNIA, CHRONIC 03/01/2007   MIGRAINE HEADACHE 03/01/2007   Narcolepsy without cataplexy 03/01/2007    ONSET DATE: 11/05/21  REFERRING DIAG: I61.0 (ICD-10-CM) - Nontraumatic subcortical hemorrhage of left cerebral hemisphere ([place])   THERAPY DIAG:  Hemiplegia and hemiparesis following nontraumatic intracerebral hemorrhage affecting right dominant side ([facility])  Other lack of coordination  Muscle weakness (generalized)  Rationale for Evaluation and Treatment Rehabilitation  SUBJECTIVE:   SUBJECTIVE STATEMENT: She removed her kinesiotape on Sunday and felt that it was helpful.   PERTINENT HISTORY: 11/05/2021 with acute onset of right-sided weakness and aphasia. Blood pressure mildly elevated 141/85. CT of the head showed acute parenchymal hemorrhage in the left corona radiata with intraventricular extension.  Hospital d/c 11/28/21.  PMH:  history of ADHD maintained on Ritalin,  migraine headaches, hypertension, hyperlipidemia, hx of narcolepsy without cataplex, Hx of R RTC repair 03/2021.     PRECAUTIONS: Fall  WEIGHT BEARING RESTRICTIONS No  PAIN:  Are you having pain? No,  FALLS: Has patient fallen in last 6 months? 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 She demonstrates tendency to hyperextend at PIP joints especially with rolling of putty and pinches  Pt completed in hand manipulation with checkers and pennies, translating pennies to tips of R thumb and index fingers before placing onto table an in container respectively for fine motor coordination  Pt completed stacking of checkers and shuffling, dealing, and turning of cards for fine motor coordination with RUE.   Pt completed scapular retraction stretch with use of foam roller at wall to facilitate improved body mechanics, RUE ROM,  and RUE strength.    Sitting edge of mat table, pt completed RUE reaching to shuttlecocks on tray table with cues for  body mechanics to improve ROM, strength, and overall  functional use.   While in prone, pt completed functional reaching of shuttlecocks with RUE for improved ROM and with LUE for increased weight-bearing/proprioceptive input through affected RUE.   Pt then transitioned from bent elbows to extended elbows alternating weight through upper extremities for improved ROM, strength, and proprioceptive input through RUE.    PATIENT  EDUCATION: Education details: Putty HEP Person educated: Patient Education method: Consulting civil engineer, Media planner, Verbal cues, and Handouts Education comprehension: verbalized understanding, returned demonstration, and needs further education   HOME EXERCISE PROGRAM: 8/18/23coordination HEP 01/07/22: Shoulder HEP  01/21/22: Updated to HEP for proximal strengthening 03/02/2022: Red putty HEP   GOALS: Potential Goals reviewed with patient? Yes  SHORT TERM GOALS: Target date: 01/23/22- extended due to schedule  Pt will be independent with initial HEP. Goal status: MET  2.  Pt will improve dominant RUE coordination/functional reaching for ADLs as shown by improving score on box and blocks test by at least 8. Baseline: 34 blocks Goal status: IN PROGRESS (01/29/22: 40 blocks)  3.  Pt will use RUE as dominant UE for eating and grooming 50% of the time. Goal status: MET (Eating 100%, grooming 100%)   4.  Pt will perform simple home maintenance tasks with supervision. Goal status: MET  5.  Pt will perform environmental scanning/navigation with at least 95% accuracy in busy environment. Goal status: IN PROGRESS  6.  Pt will be able to write at least 2 sentences with good legibility in reasonable amount of time. Goal status: MET  LONG TERM GOALS: Target date: 03/11/22  Pt will be independent with updated HEP Goal status: IN PROGRESS  2.  Pt will improve R hand coordination for ADLs as shown by completing 9-hole peg test in less than 90sec. Baseline: only able to put in 1 peg within 80mn Goal status: IN PROGRESS  3.  Pt will use RUE as dominant UE for eating and grooming at least 75% of the time. Goal status: MET  4.  Pt will perform simple-mod complex home maintenance tasks mod I. Goal status: INITIAL  5.  Pt will improve RUE strength to be be able to retrieve 3lb object from overhead shelf safely x3. Goal status: INITIAL  6.  Pt will improve R grip strength to at least 30lbs to  assist in lifting objects/opening containers. Goal status: MET (31.7 LBS)   7.  Pt will perform environmental scanning/navigation in busy environment with at least 95% accuracy for incr safety.   Goal status: INITIAL  ASSESSMENT:  CLINICAL IMPRESSION: Pt progressing towards goals. Pt reports she will return to work in Dec. Self-limiting with respect to activities she perceives she cannot do.   PERFORMANCE DEFICITS in functional skills including ADLs, IADLs, coordination, dexterity, sensation, ROM, strength, FMC, GMC, mobility, balance, decreased knowledge of precautions, decreased knowledge of use of DME, vision, and UE functional use, cognitive skills including memory, and psychosocial skills including habits.   IMPAIRMENTS are limiting patient from ADLs, IADLs, work, leisure, and social participation.   COMORBIDITIES may have co-morbidities  that affects occupational performance. Patient will benefit from skilled OT to address above impairments and improve overall function.  MODIFICATION OR ASSISTANCE TO COMPLETE EVALUATION: Min-Moderate modification of tasks or assist with assess necessary to complete an evaluation.  OT OCCUPATIONAL PROFILE AND HISTORY: Detailed assessment: Review of records and additional review of physical, cognitive, psychosocial history related to current functional performance.  CLINICAL DECISION MAKING: Moderate - several treatment options, min-mod task modification necessary  REHAB POTENTIAL: Good  EVALUATION COMPLEXITY:  Moderate    PLAN: OT FREQUENCY: 2x/week  OT DURATION: 12 weeks +eval  PLANNED INTERVENTIONS: self care/ADL training, therapeutic exercise, therapeutic activity, neuromuscular re-education, manual therapy, passive range of motion, balance training, functional mobility training, aquatic therapy, splinting, electrical stimulation, ultrasound, paraffin, fluidotherapy, moist heat, cryotherapy, patient/family education, cognitive  remediation/compensation, visual/perceptual remediation/compensation, energy conservation, and DME and/or AE instructions  RECOMMENDED OTHER SERVICES: none at this time  CONSULTED AND AGREED WITH PLAN OF CARE: Patient  PLAN FOR NEXT SESSION:  continue neuro re-ed RUE, in hand manipulation, functional reaching RUE, review red resistance putty   Dennis Bast, OTR/L 03/02/2022, 2:39 PM

## 2022-03-03 ENCOUNTER — Encounter: Payer: Self-pay | Admitting: Internal Medicine

## 2022-03-03 NOTE — Assessment & Plan Note (Signed)
She will continue current Ritalin/methylphenidate use.  I have encouraged her to be conservative with this and to use naps.

## 2022-03-03 NOTE — Assessment & Plan Note (Signed)
Difficulty maintaining sleep is a common aspect of narcolepsy. Ambien has worked well for her.  Safety again discussed.

## 2022-03-04 ENCOUNTER — Ambulatory Visit: Payer: BLUE CROSS/BLUE SHIELD | Admitting: Physical Medicine & Rehabilitation

## 2022-03-04 ENCOUNTER — Ambulatory Visit: Payer: BLUE CROSS/BLUE SHIELD | Admitting: Occupational Therapy

## 2022-03-04 ENCOUNTER — Encounter: Payer: Self-pay | Admitting: Occupational Therapy

## 2022-03-04 DIAGNOSIS — M6281 Muscle weakness (generalized): Secondary | ICD-10-CM

## 2022-03-04 DIAGNOSIS — R41842 Visuospatial deficit: Secondary | ICD-10-CM | POA: Diagnosis not present

## 2022-03-04 DIAGNOSIS — I69153 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: Secondary | ICD-10-CM

## 2022-03-04 DIAGNOSIS — I69151 Hemiplegia and hemiparesis following nontraumatic intracerebral hemorrhage affecting right dominant side: Secondary | ICD-10-CM | POA: Diagnosis not present

## 2022-03-04 DIAGNOSIS — R278 Other lack of coordination: Secondary | ICD-10-CM

## 2022-03-04 DIAGNOSIS — R2681 Unsteadiness on feet: Secondary | ICD-10-CM | POA: Diagnosis not present

## 2022-03-04 DIAGNOSIS — R2689 Other abnormalities of gait and mobility: Secondary | ICD-10-CM | POA: Diagnosis not present

## 2022-03-04 DIAGNOSIS — R208 Other disturbances of skin sensation: Secondary | ICD-10-CM | POA: Diagnosis not present

## 2022-03-04 NOTE — Therapy (Signed)
OUTPATIENT OCCUPATIONAL THERAPY NEURO TREATMENT  Patient Name: Denise Herrera MRN: [id number] DOB:[birth year], 51 y.o., female Today's Date: 03/04/2022  PCP: Dr. Antony Contras REFERRING PROVIDER: Cathlyn Parsons, PA-C    OT End of Session - 03/04/22 0849     Visit Number 16    Number of Visits 25    Date for OT Re-Evaluation 03/11/22    Authorization Type BC[employer], covered 100% (OOP met), visit limit--MN    OT Start Time 0846    OT Stop Time 0929    OT Time Calculation (min) 43 min    Activity Tolerance Patient tolerated treatment well    Behavior During Therapy WFL for tasks assessed/performed              Past Medical History:  Diagnosis Date   Chronic insomnia    History of migraine headaches    Narcolepsy without cataplexy(347.00)    Past Surgical History:  Procedure Laterality Date   CESAREAN SECTION     gyn cervical procedure     Patient Active Problem List   Diagnosis Date Noted   ICH (intracerebral hemorrhage) ([place]) 11/03/2021   Essential hypertension 09/05/2015   INSOMNIA, CHRONIC 03/01/2007   MIGRAINE HEADACHE 03/01/2007   Narcolepsy without cataplexy 03/01/2007    ONSET DATE: 11/05/21  REFERRING DIAG: I61.0 (ICD-10-CM) - Nontraumatic subcortical hemorrhage of left cerebral hemisphere ([place])   THERAPY DIAG:  Hemiplegia and hemiparesis following nontraumatic intracerebral hemorrhage affecting right dominant side (HCC)  Other lack of coordination  Muscle weakness (generalized)  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 ([place])  Rationale for Evaluation and Treatment Rehabilitation  SUBJECTIVE:   SUBJECTIVE STATEMENT: She feels that her R hand is cold a lot more following her CVA and it affects her ability to complete fine motor tasks accurately. She is coming from the gym this morning.   PERTINENT HISTORY: 11/05/2021 with acute onset of right-sided weakness and aphasia. Blood  pressure mildly elevated 141/85. CT of the head showed acute parenchymal hemorrhage in the left corona radiata with intraventricular extension.  Hospital d/c 11/28/21.  PMH:  history of ADHD maintained on Ritalin, migraine headaches, hypertension, hyperlipidemia, hx of narcolepsy without cataplex, Hx of R RTC repair 03/2021.     PRECAUTIONS: Fall  WEIGHT BEARING RESTRICTIONS No  PAIN:  Are you having pain? No,  FALLS: Has patient fallen in last 6 months? 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   TODAY'S TREATMENT:    - NMR completion for duration as noted below including:   In standing, pt utilized upper extremity ranger at wall for improved RUE ROM with cues to reduce shoulder hike and other compensatory movements.  Pt completed in hand manipulation with small pegs, picking 3 up at a time, storing them in hand, and then translating to tips of R thumb and index fingers before placing into pegboard following pattern card for fine motor coordination. Pt completed activity sitting edge of mat table with peg setup on tray table   While in quadruped, pt completed functional reaching of shuttlecocks  with RUE for improved ROM and with LUE for increased weight-bearing/proprioceptive input through affected RUE. Pt cued to bring belly button to spine for improved trunk control.  Pt then transitioned from bent elbows to extended elbows alternating weight through upper extremities for improved ROM, strength, and proprioceptive input through RUE.     - Self care completed for duration as noted below including:  Per subjective, OT applied heat pack to pt's hand to promote improved circulation, sensation, and to help with fine motor coordination with peg activity as noted above. Pt reporting improved comfort following application. Skin remained intact. Pt provided with handout for home application as noted in pt instructions.   - Therapeutic exercises completed for duration as noted below including:  OT reviewed red putty HEP for strengthening and coordination as noted in pt instructions. Pt verabalized understanding   PATIENT EDUCATION: Education details: Putty HEP Person educated: Patient Education method: Consulting civil engineer, Demonstration, Verbal cues, and Handouts Education comprehension: verbalized understanding, returned demonstration, and needs further education   HOME EXERCISE PROGRAM: 8/18/23coordination HEP 01/07/22: Shoulder HEP  01/21/22: Updated to HEP for proximal strengthening 03/04/2022: Red putty HEP   GOALS: Potential Goals reviewed with patient? Yes  SHORT TERM GOALS: Target date: 01/23/22- extended due to schedule  Pt will be independent with initial HEP. Goal status: MET  2.  Pt will improve dominant RUE coordination/functional reaching for ADLs as shown by improving score on box and blocks test by at least 8. Baseline: 34 blocks Goal status: IN PROGRESS (01/29/22: 40 blocks)  3.  Pt will use RUE as dominant UE for eating and grooming 50% of the time. Goal status: MET (Eating 100%, grooming 100%)   4.  Pt will perform simple home maintenance tasks with supervision. Goal status: MET  5.  Pt will perform environmental scanning/navigation with at least 95% accuracy in busy environment. Goal status: IN PROGRESS  6.  Pt will be able to write at least 2 sentences with good legibility in reasonable amount of time. Goal status: MET  LONG TERM GOALS: Target date: 03/11/22  Pt will be independent with updated  HEP Goal status: IN PROGRESS  2.  Pt will improve R hand coordination for ADLs as shown by completing 9-hole peg test in less than 90sec. Baseline: only able to put in 1 peg within 86mn Goal status: IN PROGRESS  3.  Pt will use RUE as dominant UE for eating and grooming at least 75% of the time. Goal status: MET  4.  Pt will perform simple-mod complex home maintenance tasks mod I. Goal status: INITIAL  5.  Pt will improve RUE strength to be be able to retrieve 3lb object from overhead shelf safely x3. Goal status: INITIAL  6.  Pt will improve R grip strength to at least 30lbs to assist in lifting objects/opening containers. Goal status: MET (31.7 LBS)   7.  Pt will perform environmental scanning/navigation in busy environment with at least 95% accuracy for incr safety.   Goal status: INITIAL  ASSESSMENT:  CLINICAL IMPRESSION: Pt progressing towards goals. Pt reports she will return to work in Dec. Highly motivated with her recovery and carryover of HEP to home.   PERFORMANCE DEFICITS in functional skills including ADLs, IADLs, coordination, dexterity, sensation, ROM, strength, FMC, GMC, mobility, balance, decreased knowledge of precautions, decreased knowledge of use of DME, vision, and UE functional use, cognitive skills including memory, and psychosocial skills including habits.   IMPAIRMENTS are limiting patient from ADLs, IADLs, work, leisure, and social  participation.   COMORBIDITIES may have co-morbidities  that affects occupational performance. Patient will benefit from skilled OT to address above impairments and improve overall function.  MODIFICATION OR ASSISTANCE TO COMPLETE EVALUATION: Min-Moderate modification of tasks or assist with assess necessary to complete an evaluation.  OT OCCUPATIONAL PROFILE AND HISTORY: Detailed assessment: Review of records and additional review of physical, cognitive, psychosocial history related to current functional  performance.  CLINICAL DECISION MAKING: Moderate - several treatment options, min-mod task modification necessary  REHAB POTENTIAL: Good  EVALUATION COMPLEXITY: Moderate    PLAN: OT FREQUENCY: 2x/week  OT DURATION: 12 weeks +eval  PLANNED INTERVENTIONS: self care/ADL training, therapeutic exercise, therapeutic activity, neuromuscular re-education, manual therapy, passive range of motion, balance training, functional mobility training, aquatic therapy, splinting, electrical stimulation, ultrasound, paraffin, fluidotherapy, moist heat, cryotherapy, patient/family education, cognitive remediation/compensation, visual/perceptual remediation/compensation, energy conservation, and DME and/or AE instructions  RECOMMENDED OTHER SERVICES: none at this time  CONSULTED AND AGREED WITH PLAN OF CARE: Patient  PLAN FOR NEXT SESSION:  continue neuro re-ed RUE, in hand manipulation, functional reaching RUE, review red resistance putty   Dennis Bast, OTR/L 03/04/2022, 10:21 AM

## 2022-03-05 DIAGNOSIS — M79604 Pain in right leg: Secondary | ICD-10-CM | POA: Diagnosis not present

## 2022-03-05 DIAGNOSIS — I639 Cerebral infarction, unspecified: Secondary | ICD-10-CM | POA: Diagnosis not present

## 2022-03-09 ENCOUNTER — Encounter: Payer: Self-pay | Admitting: Occupational Therapy

## 2022-03-09 ENCOUNTER — Ambulatory Visit: Payer: BLUE CROSS/BLUE SHIELD | Admitting: Occupational Therapy

## 2022-03-09 DIAGNOSIS — I69151 Hemiplegia and hemiparesis following nontraumatic intracerebral hemorrhage affecting right dominant side: Secondary | ICD-10-CM | POA: Diagnosis not present

## 2022-03-09 DIAGNOSIS — R278 Other lack of coordination: Secondary | ICD-10-CM

## 2022-03-09 DIAGNOSIS — I69153 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: Secondary | ICD-10-CM | POA: Diagnosis not present

## 2022-03-09 DIAGNOSIS — R2689 Other abnormalities of gait and mobility: Secondary | ICD-10-CM

## 2022-03-09 DIAGNOSIS — R2681 Unsteadiness on feet: Secondary | ICD-10-CM

## 2022-03-09 DIAGNOSIS — R41842 Visuospatial deficit: Secondary | ICD-10-CM | POA: Diagnosis not present

## 2022-03-09 DIAGNOSIS — M6281 Muscle weakness (generalized): Secondary | ICD-10-CM | POA: Diagnosis not present

## 2022-03-09 DIAGNOSIS — R208 Other disturbances of skin sensation: Secondary | ICD-10-CM | POA: Diagnosis not present

## 2022-03-09 NOTE — Therapy (Signed)
OUTPATIENT OCCUPATIONAL THERAPY NEURO TREATMENT  Patient Name: Denise Herrera MRN: [id number] DOB:[birth year], 51 y.o., female Today's Date: 03/09/2022  PCP: Dr. Antony Contras REFERRING PROVIDER: Cathlyn Parsons, PA-C    OT End of Session - 03/09/22 0940     Visit Number 17    Number of Visits 25    Date for OT Re-Evaluation 03/11/22    Authorization Type [employer], covered 100% (OOP met), visit limit--MN    OT Start Time 0934    OT Stop Time 1015    OT Time Calculation (min) 41 min    Activity Tolerance Patient tolerated treatment well    Behavior During Therapy [facility] for tasks assessed/performed               Past Medical History:  Diagnosis Date   Chronic insomnia    History of migraine headaches    Narcolepsy without cataplexy(347.00)    Past Surgical History:  Procedure Laterality Date   CESAREAN SECTION     gyn cervical procedure     Patient Active Problem List   Diagnosis Date Noted   ICH (intracerebral hemorrhage) ([place]) 11/03/2021   Essential hypertension 09/05/2015   INSOMNIA, CHRONIC 03/01/2007   MIGRAINE HEADACHE 03/01/2007   Narcolepsy without cataplexy 03/01/2007    ONSET DATE: 11/05/21  REFERRING DIAG: I61.0 (ICD-10-CM) - Nontraumatic subcortical hemorrhage of left cerebral hemisphere ([place])   THERAPY DIAG:  No diagnosis found.  Rationale for Evaluation and Treatment Rehabilitation  SUBJECTIVE:   SUBJECTIVE STATEMENT: She feels that her R hand is cold a lot more following her CVA and it affects her ability to complete fine motor tasks accurately. She is coming from the gym this morning.   PERTINENT HISTORY: 11/05/2021 with acute onset of right-sided weakness and aphasia. Blood pressure mildly elevated 141/85. CT of the head showed acute parenchymal hemorrhage in the left corona radiata with intraventricular extension.  Hospital d/c 11/28/21.  PMH:  history of ADHD maintained on Ritalin, migraine headaches, hypertension, hyperlipidemia, hx of  narcolepsy without cataplex, Hx of R RTC repair 03/2021.     PRECAUTIONS: Fall  WEIGHT BEARING RESTRICTIONS No  PAIN:  Are you having pain? No,  FALLS: Has patient fallen in last 6 months? 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   TODAY'S TREATMENT:                Wall pushups x 10 min v.c then rolling ball up vertical surface with bilateral UE's min v.c   Therapist stated checking progress towards goals in preparation for d/c, see pt goals for updates.  Environmental scanning 14/15 located on first pass, 93% accuracy.  Pt was able to place and retrieve a 3 lbs weight from overhead shelf without difficulty.  Gripper set at level 3 to pick up 1 inch blocks min-mod difficulty and drops.   PATIENT EDUCATION: Education details: progress towards goals and plans to d/c next visit, discussed graduated driving as MD has cleared pt for return to driving. Person educated: Patient Education method: Explanation,  Education comprehension: verbalized understanding,   HOME  EXERCISE PROGRAM: 8/18/23coordination HEP 01/07/22: Shoulder HEP  01/21/22: Updated to HEP for proximal strengthening 03/09/2022: Red putty HEP   GOALS: Potential Goals reviewed with patient? Yes  SHORT TERM GOALS: Target date: 01/23/22- extended due to schedule  Pt will be independent with initial HEP. Goal status: MET  2.  Pt will improve dominant RUE coordination/functional reaching for ADLs as shown by improving score on box and blocks test by at least  8. Baseline: 34 blocks Goal status: IN PROGRESS (41 blocks, 44 blocks- met second trial)  3.  Pt will use RUE as dominant UE for eating and grooming 50% of the time. Goal status: MET (Eating 100%, grooming 100%)   4.  Pt will perform simple home maintenance tasks with supervision. Goal status: MET  5.  Pt will perform environmental scanning/navigation with at least 95% accuracy in busy environment. Goal status: IN PROGRESS 93%  6.  Pt will be able to write at least 2 sentences with good legibility in reasonable amount of time. Goal status: MET  LONG TERM GOALS: Target date: 03/11/22  Pt will be independent with updated HEP Goal status: IN PROGRESS  2.  Pt will improve R hand coordination for ADLs as shown by completing 9-hole peg test in less than 90sec. Baseline: only able to put in 1 peg within 27mn Goal status: 70.47, met 3.  Pt will use RUE as dominant UE for eating and grooming at least 75% of the time. Goal status: MET  4.  Pt will perform simple-mod complex home maintenance tasks mod I. Goal status: met  5. 5.  Pt will improve RUE strength to be be able to retrieve 3lb object from overhead shelf safely x3. Goal status: MET 6.  Pt will improve R grip strength to at least 30lbs to assist in lifting objects/opening containers. Goal status: MET (31.7 LBS)   7.  Pt will perform environmental scanning/navigation in busy environment with at least 95% accuracy for incr safety.   Goal status:ongoing 93% 14/15  ASSESSMENT:  CLINICAL IMPRESSION: Pt progressing towards goals. Pt reports she will return to work in January. Discussed overall progress. Pt continues to demonstrate improved strength and coordination. She reports going to the gym.  PERFORMANCE DEFICITS in functional skills including ADLs, IADLs, coordination, dexterity, sensation, ROM, strength, FMC, GMC, mobility, balance, decreased knowledge of precautions, decreased knowledge of use of DME, vision, and UE  functional use, cognitive skills including memory, and psychosocial skills including habits.   IMPAIRMENTS are limiting patient from ADLs, IADLs, work, leisure, and social participation.   COMORBIDITIES may have co-morbidities  that affects occupational performance. Patient will benefit from skilled OT to address above impairments and improve overall function.  MODIFICATION OR ASSISTANCE TO COMPLETE EVALUATION: Min-Moderate modification of tasks or assist with assess necessary to complete an evaluation.  OT OCCUPATIONAL PROFILE AND HISTORY: Detailed assessment: Review of records and additional review of physical, cognitive, psychosocial history related to current functional performance.  CLINICAL DECISION MAKING: Moderate - several treatment options, min-mod task modification necessary  REHAB POTENTIAL: Good  EVALUATION COMPLEXITY: Moderate    PLAN: OT FREQUENCY: 2x/week  OT DURATION: 12 weeks +eval  PLANNED INTERVENTIONS: self care/ADL training, therapeutic exercise, therapeutic activity, neuromuscular re-education, manual therapy, passive range of motion, balance training, functional mobility training, aquatic therapy, splinting, electrical stimulation, ultrasound, paraffin, fluidotherapy, moist heat, cryotherapy, patient/family education, cognitive remediation/compensation, visual/perceptual remediation/compensation, energy conservation, and DME and/or AE instructions  RECOMMENDED OTHER SERVICES: none at this time  CONSULTED AND AGREED WITH PLAN  OF CARE: Patient  PLAN FOR NEXT SESSION: check goals, anticipate d/c next visit.   Tomia Enlow, OTR/L 03/09/2022, 9:41 AM

## 2022-03-10 DIAGNOSIS — M79604 Pain in right leg: Secondary | ICD-10-CM | POA: Diagnosis not present

## 2022-03-10 DIAGNOSIS — I639 Cerebral infarction, unspecified: Secondary | ICD-10-CM | POA: Diagnosis not present

## 2022-03-11 ENCOUNTER — Ambulatory Visit: Payer: BLUE CROSS/BLUE SHIELD | Admitting: Occupational Therapy

## 2022-03-11 DIAGNOSIS — M6281 Muscle weakness (generalized): Secondary | ICD-10-CM

## 2022-03-11 DIAGNOSIS — I69151 Hemiplegia and hemiparesis following nontraumatic intracerebral hemorrhage affecting right dominant side: Secondary | ICD-10-CM | POA: Diagnosis not present

## 2022-03-11 DIAGNOSIS — I69153 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: Secondary | ICD-10-CM

## 2022-03-11 DIAGNOSIS — R278 Other lack of coordination: Secondary | ICD-10-CM | POA: Diagnosis not present

## 2022-03-11 DIAGNOSIS — R41842 Visuospatial deficit: Secondary | ICD-10-CM | POA: Diagnosis not present

## 2022-03-11 DIAGNOSIS — R208 Other disturbances of skin sensation: Secondary | ICD-10-CM

## 2022-03-11 DIAGNOSIS — R2689 Other abnormalities of gait and mobility: Secondary | ICD-10-CM | POA: Diagnosis not present

## 2022-03-11 DIAGNOSIS — R2681 Unsteadiness on feet: Secondary | ICD-10-CM | POA: Diagnosis not present

## 2022-03-11 NOTE — Therapy (Signed)
OUTPATIENT OCCUPATIONAL THERAPY NEURO TREATMENT/  Patient Name: Denise Herrera MRN: [id number] DOB:1970/07/06, 51 y.o., female Today's Date: 03/11/2022  PCP: Dr. Antony Contras REFERRING PROVIDER: Cathlyn Parsons, PA-C     Current functional level related to goals / functional outcomes: Pt made good overall progress. She achieved 6/7 long term goals.  See goals below for progress.   Remaining deficits: Sensory deficits, decreased coordination, decrease strength   Education / Equipment: Pt was educated regarding: safety due to sensory deficits, HEP, scanning strategies and safety for ADLs/IADLS.  Pt verbalizes understanding.   Patient agrees to discharge. Patient goals were partially met. Patient is being discharged due to being pleased with the current functional level..      OT End of Session - 03/11/22 0927     Visit Number 18    Number of Visits 25    Date for OT Re-Evaluation 03/11/22    Authorization Type [employer], covered 100% (OOP met), visit limit--MN    OT Start Time 0848    OT Stop Time 0928    OT Time Calculation (min) 40 min    Activity Tolerance Patient tolerated treatment well    Behavior During Therapy WFL for tasks assessed/performed                Past Medical History:  Diagnosis Date   Chronic insomnia    History of migraine headaches    Narcolepsy without cataplexy(347.00)    Past Surgical History:  Procedure Laterality Date   CESAREAN SECTION     gyn cervical procedure     Patient Active Problem List   Diagnosis Date Noted   ICH (intracerebral hemorrhage) ([place]) 11/03/2021   Essential hypertension 09/05/2015   INSOMNIA, CHRONIC 03/01/2007   MIGRAINE HEADACHE 03/01/2007   Narcolepsy without cataplexy 03/01/2007    ONSET DATE: 11/05/21  REFERRING DIAG: I61.0 (ICD-10-CM) - Nontraumatic subcortical hemorrhage of left cerebral hemisphere ([place])   THERAPY DIAG:  Hemiplegia and hemiparesis following nontraumatic intracerebral hemorrhage  affecting right dominant side (HCC)  Other lack of coordination  Muscle weakness (generalized)  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 (HCC)  Other disturbances of skin sensation  Other abnormalities of gait and mobility  Visuospatial deficit  Rationale for Evaluation and Treatment Rehabilitation  SUBJECTIVE:   SUBJECTIVE STATEMENT: She feels that her R hand is cold a lot more following her CVA and it affects her ability to complete fine motor tasks accurately. She is coming from the gym this morning.   PERTINENT HISTORY: 11/05/2021 with acute onset of right-sided weakness and aphasia. Blood pressure mildly elevated 141/85. CT of the head showed acute parenchymal hemorrhage in the left corona radiata with intraventricular extension.  Hospital d/c 11/28/21.  PMH:  history of ADHD maintained on Ritalin, migraine headaches, hypertension, hyperlipidemia, hx of narcolepsy without cataplex, Hx of R RTC repair 03/2021.     PRECAUTIONS: Fall  WEIGHT BEARING RESTRICTIONS No  PAIN:  Are you having pain? No,  FALLS: Has patient fallen in last 6 months? 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   TODAY'S TREATMENT:                Reviewed strengthening exercises issued 01/21/22, min v.c initially then pt returned demonstration.  Fine motor coordination activities: flipping, dealing and shuffling cards with min difficulty.  Holding several coins in hand to place 1 coin  in a container at a time, min v.c     Rotating ball in hand min v.c    PATIENT EDUCATION: Education details: progress towards goals, HEP review Person educated: Patient Education method: Explanation, demonstration Education comprehension: verbalized understanding, returned demonstration  HOME EXERCISE PROGRAM: 8/18/23coordination HEP 01/07/22: Shoulder HEP  01/21/22: Updated to HEP for proximal strengthening 03/11/2022: Red putty HEP   GOALS: Potential Goals reviewed with patient? Yes  SHORT TERM GOALS: Target date: 01/23/22- extended due to schedule  Pt will be independent with initial HEP. Goal status: MET  2.  Pt will improve dominant RUE coordination/functional reaching for ADLs as shown by improving score on box and blocks test by at least 8. Baseline: 34 blocks Goal status:met 52 blocks  3.  Pt will use RUE as dominant UE for eating and grooming 50% of the time. Goal status: MET (Eating 100%, grooming 100%)   4.  Pt will perform simple home maintenance tasks with supervision. Goal status: MET  5.  Pt will perform environmental scanning/navigation with at least 95% accuracy in busy environment. Goal status: NOT MET 93%  6.  Pt will be able to write at least 2 sentences with good legibility in reasonable amount of time. Goal status: MET  LONG TERM GOALS: Target date: 03/11/22  Pt will be independent with updated HEP Goal status: MET  2.  Pt will improve R hand coordination for ADLs as shown by completing 9-hole peg test in less than 90sec. Baseline: only able to put in 1 peg within 36mn Goal status: 70.47, met 3.  Pt will use RUE as dominant UE for eating and grooming at least 75% of the time. Goal status: MET  4.  Pt will perform simple-mod complex home maintenance tasks mod I. Goal status: met  5. 5.  Pt will improve RUE strength to be be able to retrieve 3lb object from overhead shelf safely x3. Goal status: MET 6.  Pt will improve R grip strength to at  least 30lbs to assist in lifting objects/opening containers. Goal status: MET (31.7 LBS)   7.  Pt will perform environmental scanning/navigation in busy environment with at least 95% accuracy for incr safety.   Goal status:NOT FULLY MET 93% 14/15  ASSESSMENT:  CLINICAL IMPRESSION: Pt demonstrates good overall progress. She agrees with plans to wrap up therapy today.  PERFORMANCE DEFICITS in functional skills including ADLs, IADLs, coordination, dexterity, sensation, ROM, strength, FMC, GMC, mobility, balance, decreased knowledge of precautions, decreased knowledge of use of DME, vision, and UE functional use, cognitive skills including memory, and psychosocial skills including habits.   IMPAIRMENTS are limiting patient from ADLs, IADLs, work, leisure, and social participation.   COMORBIDITIES may have co-morbidities  that affects occupational performance. Patient will benefit from skilled OT to address above impairments and improve overall function.  MODIFICATION OR ASSISTANCE TO COMPLETE EVALUATION: Min-Moderate modification of tasks or assist with assess necessary to complete an evaluation.  OT OCCUPATIONAL PROFILE AND HISTORY: Detailed assessment: Review of records and additional review of physical, cognitive, psychosocial history related to current functional performance.  CLINICAL DECISION MAKING: Moderate - several treatment options, min-mod task modification necessary  REHAB POTENTIAL:  Good  EVALUATION COMPLEXITY: Moderate    PLAN: OT FREQUENCY: 2x/week  OT DURATION: 12 weeks +eval  PLANNED INTERVENTIONS: self care/ADL training, therapeutic exercise, therapeutic activity, neuromuscular re-education, manual therapy, passive range of motion, balance training, functional mobility training, aquatic therapy, splinting, electrical stimulation, ultrasound, paraffin, fluidotherapy, moist heat, cryotherapy, patient/family education, cognitive remediation/compensation,  visual/perceptual remediation/compensation, energy conservation, and DME and/or AE instructions  RECOMMENDED OTHER SERVICES: none at this time  CONSULTED AND AGREED WITH PLAN OF CARE: Patient  PLAN FOR NEXT SESSION: D/C OT Nadav Swindell, OTR/L 03/11/2022, 9:27 AM

## 2022-03-18 DIAGNOSIS — M79604 Pain in right leg: Secondary | ICD-10-CM | POA: Diagnosis not present

## 2022-03-18 DIAGNOSIS — I639 Cerebral infarction, unspecified: Secondary | ICD-10-CM | POA: Diagnosis not present

## 2022-03-24 DIAGNOSIS — M79604 Pain in right leg: Secondary | ICD-10-CM | POA: Diagnosis not present

## 2022-03-24 DIAGNOSIS — I639 Cerebral infarction, unspecified: Secondary | ICD-10-CM | POA: Diagnosis not present

## 2022-03-31 DIAGNOSIS — I619 Nontraumatic intracerebral hemorrhage, unspecified: Secondary | ICD-10-CM | POA: Diagnosis not present

## 2022-03-31 DIAGNOSIS — M79604 Pain in right leg: Secondary | ICD-10-CM | POA: Diagnosis not present

## 2022-03-31 DIAGNOSIS — I639 Cerebral infarction, unspecified: Secondary | ICD-10-CM | POA: Diagnosis not present

## 2022-04-02 ENCOUNTER — Telehealth: Payer: Self-pay | Admitting: Internal Medicine

## 2022-04-02 NOTE — Telephone Encounter (Signed)
Called and left message for patient to call back to clarify medication she is needing refilled.

## 2022-04-07 DIAGNOSIS — I639 Cerebral infarction, unspecified: Secondary | ICD-10-CM | POA: Diagnosis not present

## 2022-04-07 DIAGNOSIS — M79604 Pain in right leg: Secondary | ICD-10-CM | POA: Diagnosis not present

## 2022-04-14 ENCOUNTER — Telehealth: Payer: Self-pay | Admitting: Adult Health

## 2022-04-14 NOTE — Telephone Encounter (Signed)
Contacted pt back, she stated she has completed PT/OT with minimal benefit to her R hand. She stated she does dry needing and the therapist there mentioned she may benefit from receiving botox to help with her hand spacticity. She also mentioned being on a medication for her arthritis, PCP took her off of it when she had the stroke and she believes that could possibly be contributing to the stiffness as well. She wants to know if she could be a candidate for receiving the botox. Did inform her she has to give her body time to recover and unfortunately, things like this are long term side affect from strokes. Was advised Shanda Bumps is out of the office at the moment, she was ok with waiting for jessica's return to advise of her recommendations.

## 2022-04-14 NOTE — Telephone Encounter (Signed)
Pt called wanting to discuss Botox with the RN. Please advise.

## 2022-04-15 NOTE — Telephone Encounter (Signed)
She already follows with physical medicine and rehab who can further discuss use/benefit of Botox treatments, I would advise her to reach out to that office to discuss further.  Thank you.

## 2022-04-17 DIAGNOSIS — M25521 Pain in right elbow: Secondary | ICD-10-CM | POA: Diagnosis not present

## 2022-04-17 DIAGNOSIS — M79604 Pain in right leg: Secondary | ICD-10-CM | POA: Diagnosis not present

## 2022-04-17 DIAGNOSIS — I639 Cerebral infarction, unspecified: Secondary | ICD-10-CM | POA: Diagnosis not present

## 2022-04-21 MED ORDER — ZOLPIDEM TARTRATE ER 12.5 MG PO TBCR: 12.5000 mg | EXTENDED_RELEASE_TABLET | Freq: Every evening | ORAL | 5 refills | Status: DC | PRN

## 2022-04-21 NOTE — Telephone Encounter (Signed)
Pt notified that her rx has been refilled  Nothing further needed

## 2022-04-21 NOTE — Telephone Encounter (Signed)
She will need to make an appointment with PMR to discuss as they are the ones that would do botox and is already an established patient of their office.

## 2022-04-21 NOTE — Telephone Encounter (Signed)
Adderall refilled

## 2022-04-21 NOTE — Telephone Encounter (Signed)
I have already advised this pt to discuss this with physical medicine and rehab. Are you willing to make appt to discuss or reiterate the same info previously given ?

## 2022-04-21 NOTE — Telephone Encounter (Signed)
D Young- please advise on refll for ambien CR 12.5  She is requesting that this be sent to [facility]  Thanks!

## 2022-04-21 NOTE — Addendum Note (Signed)
Addended by: Jetty Duhamel D on: 04/21/2022 08:56 AM   Modules accepted: Orders

## 2022-04-22 DIAGNOSIS — I639 Cerebral infarction, unspecified: Secondary | ICD-10-CM | POA: Diagnosis not present

## 2022-04-22 DIAGNOSIS — M79604 Pain in right leg: Secondary | ICD-10-CM | POA: Diagnosis not present

## 2022-04-24 DIAGNOSIS — M25521 Pain in right elbow: Secondary | ICD-10-CM | POA: Diagnosis not present

## 2022-04-30 DIAGNOSIS — I619 Nontraumatic intracerebral hemorrhage, unspecified: Secondary | ICD-10-CM | POA: Diagnosis not present

## 2022-05-01 DIAGNOSIS — I639 Cerebral infarction, unspecified: Secondary | ICD-10-CM | POA: Diagnosis not present

## 2022-05-01 DIAGNOSIS — M79604 Pain in right leg: Secondary | ICD-10-CM | POA: Diagnosis not present

## 2022-05-04 DIAGNOSIS — E7800 Pure hypercholesterolemia, unspecified: Secondary | ICD-10-CM | POA: Diagnosis not present

## 2022-05-04 DIAGNOSIS — K219 Gastro-esophageal reflux disease without esophagitis: Secondary | ICD-10-CM | POA: Diagnosis not present

## 2022-05-04 DIAGNOSIS — I10 Essential (primary) hypertension: Secondary | ICD-10-CM | POA: Diagnosis not present

## 2022-05-12 DIAGNOSIS — N289 Disorder of kidney and ureter, unspecified: Secondary | ICD-10-CM | POA: Diagnosis not present

## 2022-05-25 ENCOUNTER — Telehealth: Payer: Self-pay | Admitting: Internal Medicine

## 2022-05-25 NOTE — Telephone Encounter (Signed)
PT called and wants refill of Zolpidem and Ritalin called in. TY.

## 2022-05-26 MED ORDER — METHYLPHENIDATE HCL 5 MG PO TABS: ORAL_TABLET | ORAL | 0 refills | Status: DC

## 2022-05-26 MED ORDER — ZOLPIDEM TARTRATE ER 12.5 MG PO TBCR
12.5000 mg | EXTENDED_RELEASE_TABLET | Freq: Every evening | ORAL | 5 refills | Status: DC | PRN
Start: 2022-05-26 — End: 2022-06-29

## 2022-05-26 NOTE — Telephone Encounter (Signed)
Refills done.

## 2022-05-26 NOTE — Telephone Encounter (Signed)
Needs Zolpidem and Ritalin 5mg  refilled   [facility] [address]

## 2022-05-27 ENCOUNTER — Encounter: Payer: BLUE CROSS/BLUE SHIELD | Attending: Registered Nurse | Admitting: Physical Medicine & Rehabilitation

## 2022-05-27 ENCOUNTER — Encounter: Payer: Self-pay | Admitting: Physical Medicine & Rehabilitation

## 2022-05-27 VITALS — BP 142/81 | HR 86 | Ht 64.0 in | Wt 174.0 lb

## 2022-05-27 DIAGNOSIS — R29818 Other symptoms and signs involving the nervous system: Secondary | ICD-10-CM

## 2022-05-27 DIAGNOSIS — I610 Nontraumatic intracerebral hemorrhage in hemisphere, subcortical: Secondary | ICD-10-CM | POA: Insufficient documentation

## 2022-05-27 MED ORDER — BACLOFEN 10 MG PO TABS: 10.0000 mg | ORAL_TABLET | Freq: Four times a day (QID) | ORAL | 3 refills | Status: DC

## 2022-05-27 NOTE — Patient Instructions (Signed)
ALWAYS FEEL FREE TO CALL OUR OFFICE WITH ANY PROBLEMS OR QUESTIONS ([phone])  **PLEASE NOTE** ALL MEDICATION REFILL REQUESTS (INCLUDING CONTROLLED SUBSTANCES) NEED TO BE MADE AT LEAST 7 DAYS PRIOR TO REFILL BEING DUE. ANY REFILL REQUESTS INSIDE THAT TIME FRAME MAY RESULT IN DELAYS IN RECEIVING YOUR PRESCRIPTION.                    

## 2022-05-27 NOTE — Progress Notes (Signed)
Subjective:    Patient ID: Denise Herrera, female    DOB: Jun 23, 1970, 52 y.o.   MRN: [id number]  HPI  Denise Herrera is here in follow up of her left thalamic hemorrhage. She was laid off her job in December. She has been working on her strength, coordination, and  mobility. She tries to do light work around the house. She may do some light cooking. She will go to the gym to work on strength, balance, cardio as she tolerates.   She still has problems coordinating with her right side. She frequently drops items when carrying with her right hand. She hasn't had any falls or near misses. She has spasms in her right hand. She's taking baclofen 10mg  tid and cyclobenzaprine prn.   Pain is minimal. Sleep is good. Bowels and bladder are working fine.   Pain Inventory Average Pain 1 Pain Right Now 1 My pain is dull and aching  In the last 24 hours, has pain interfered with the following? General activity 1 Relation with others 1 Enjoyment of life 1 What TIME of day is your pain at its worst? morning , daytime, evening, and night Sleep (in general) Good  Pain is worse with: some activites Pain improves with:  brace Relief from Meds:  na  Family History  Problem Relation Age of Onset   Asthma Mother    Hypertension Mother    Cancer Mother        breast cancer   Hypertension Maternal Grandfather    Diabetes Maternal Grandfather    Hypertension Maternal Grandmother    Social History   Socioeconomic History   Marital status: Married    Spouse name: Not on file   Number of children: 3   Years of education: Not on file   Highest education level: Not on file  Occupational History   Occupation: makes medical hose and prostheses  Tobacco Use   Smoking status: Never   Smokeless tobacco: Never  Substance and Sexual Activity   Alcohol use: No    Alcohol/week: 0.0 standard drinks of alcohol   Drug use: No   Sexual activity: Not on file  Other Topics Concern   Not on file  Social History  Narrative   Not on file   Social Determinants of Health   Financial Resource Strain: Not on file  Food Insecurity: Not on file  Transportation Needs: Not on file  Physical Activity: Not on file  Stress: Not on file  Social Connections: Not on file   Past Surgical History:  Procedure Laterality Date   CESAREAN SECTION     gyn cervical procedure     Past Surgical History:  Procedure Laterality Date   CESAREAN SECTION     gyn cervical procedure     Past Medical History:  Diagnosis Date   Chronic insomnia    History of migraine headaches    Narcolepsy without cataplexy(347.00)    BP (!) 142/81   Pulse 86   Ht 5\' 4"  (1.626 m)   Wt 174 lb (78.9 kg)   SpO2 99%   BMI 29.87 kg/m   Opioid Risk Score:   Fall Risk Score:  `1  Depression screen [facility] 2/9     02/09/2022   10:01 AM 12/17/2021    2:24 PM  Depression screen PHQ 2/9  Decreased Interest 0 0  Down, Depressed, Hopeless 0 0  PHQ - 2 Score 0 0  Altered sleeping  0  Tired, decreased energy  0  Change  in appetite  0  Feeling bad or failure about yourself   0  Trouble concentrating  0  Moving slowly or fidgety/restless  0  Suicidal thoughts  0  PHQ-9 Score  0     Review of Systems  Musculoskeletal:        Right arm pain  All other systems reviewed and are negative.      Objective:   Physical Exam  General: No acute distress HEENT: NCAT, EOMI, oral membranes moist Cards: reg rate  Chest: normal effort Abdomen: Soft, NT, ND Skin: dry, intact Extremities: no edema Psych: pleasant and appropriate  Skin: intact Neuro: Alert and oriented x 3. Normal insight and awareness. Intact Memory. Normal language and speech. Cranial nerve exam unremarkable except for mild right facial numbness. RUE grossly 4+/5 prox to dista--not much change. RLE 4 to 4+/5 prox to distal. Sensory remains 1/2 RUE and 1+/2 RLE for fine touch and pain. Mild right PD. Negative romberg test. Still with wide based gait. Still sl non-fluent.   No resting tone. DTR's 2+ Musculoskeletal: normal rom.   Good  sitting posture           Assessment & Plan:  Medical Problem List and Plan: 1. Functional deficits secondary to left thalamic ICH likely secondary to hypertensive crisis             -She has made nice gains overall!             -still has significant right hemi-sensory loss               -she's driving             -she is not able to return to work at her prior level. She can only return at sedentary level however, given her ongoing sensory loss 2.   Pain Management/RUE spasms:    -adjust baclofen to 10mg  qid, flexeril             - headaches resolved 3. Mood/Sleep/ADHD: metadate per primary 4. HTN: has been controlled. Continue per primary     Thirty minutes of face to face patient care time were spent during this visit. All questions were encouraged and answered. Follow up with me in 4 mos.

## 2022-05-31 DIAGNOSIS — I619 Nontraumatic intracerebral hemorrhage, unspecified: Secondary | ICD-10-CM | POA: Diagnosis not present

## 2022-06-22 ENCOUNTER — Telehealth: Payer: Self-pay | Admitting: Physical Medicine & Rehabilitation

## 2022-06-22 MED ORDER — TIZANIDINE HCL 2 MG PO TABS: 2.0000 mg | ORAL_TABLET | Freq: Two times a day (BID) | ORAL | 2 refills | Status: DC

## 2022-06-22 NOTE — Telephone Encounter (Signed)
The patient called and said the baclofen still isn't helping the pain in her hand, and its making her feel confused. She would like to be switched to something else.

## 2022-06-22 NOTE — Telephone Encounter (Signed)
Just to be clear. She told me she was having spasms in her right hand. Not pain per se.   She can reduce baclofen to 10mg  bid for 2 days then 10mg  once daily for 2 days then stop.  Begin tizanidine in 2 days at 2mg  qhs, then increase to BID once baclofen is stopped. An rx for tizanidine was sent to pharmacy.

## 2022-06-29 ENCOUNTER — Telehealth: Payer: Self-pay | Admitting: Internal Medicine

## 2022-06-29 MED ORDER — METHYLPHENIDATE HCL ER 20 MG PO TBCR: EXTENDED_RELEASE_TABLET | ORAL | 0 refills | Status: DC

## 2022-06-29 MED ORDER — ZOLPIDEM TARTRATE ER 12.5 MG PO TBCR: 12.5000 mg | EXTENDED_RELEASE_TABLET | Freq: Every evening | ORAL | 5 refills | Status: DC | PRN

## 2022-06-29 NOTE — Telephone Encounter (Signed)
Called spoke with patient. Patient stated that she needed refills on her [place] and metadate. Patient also stated that the metadate she needs called in the 20 mg.   CY, please advise.

## 2022-06-29 NOTE — Telephone Encounter (Signed)
Metadate 20 and ambien refilled

## 2022-06-29 NOTE — Telephone Encounter (Signed)
Called and spoke with patient. Advised patient her prescriptions were refilled. She verbalized understanding.   Nothing further needed.

## 2022-07-07 ENCOUNTER — Telehealth: Payer: Self-pay | Admitting: Physical Medicine & Rehabilitation

## 2022-07-07 NOTE — Telephone Encounter (Signed)
Patient came to office- She stated Tizanidine is helping, but still having trouble fully using right hand and elbow. Please advise patient.

## 2022-07-07 NOTE — Telephone Encounter (Signed)
Called patient back and informed per Dr. Naaman Plummer directions to increase Tizanidine to tid if she felt she was benefiting from it. Patient verbalized understanding nothing further needed.

## 2022-07-09 DIAGNOSIS — Z1231 Encounter for screening mammogram for malignant neoplasm of breast: Secondary | ICD-10-CM | POA: Diagnosis not present

## 2022-07-30 ENCOUNTER — Telehealth: Payer: Self-pay | Admitting: Physical Medicine & Rehabilitation

## 2022-07-30 NOTE — Telephone Encounter (Signed)
Patient came to office. The Tizanidine has not helped mobility in her right arm. She feels she is at a stand still. Please advise

## 2022-08-05 ENCOUNTER — Encounter: Payer: BLUE CROSS/BLUE SHIELD | Attending: Registered Nurse | Admitting: Physical Medicine & Rehabilitation

## 2022-08-05 ENCOUNTER — Encounter: Payer: Self-pay | Admitting: Physical Medicine & Rehabilitation

## 2022-08-05 VITALS — BP 129/86 | HR 75 | Ht 64.0 in | Wt 171.0 lb

## 2022-08-05 DIAGNOSIS — R29818 Other symptoms and signs involving the nervous system: Secondary | ICD-10-CM | POA: Diagnosis not present

## 2022-08-05 DIAGNOSIS — I610 Nontraumatic intracerebral hemorrhage in hemisphere, subcortical: Secondary | ICD-10-CM | POA: Insufficient documentation

## 2022-08-05 NOTE — Progress Notes (Signed)
Subjective:    Patient ID: PRISTINE SALVETTI, female    DOB: 1971/05/12, 52 y.o.   MRN: [id number]  HPI  Denise Herrera is here in follow up of her left thalamic hemorrhage. She is frustrated that she is still not able to use her right hand like she wants to. The tizanidine has helped any spasms in her right arm although she still has sense of being full and tight. Sometimes the right leg and arm have a deep tenderness.  She tries to work on stretching at home and has done some light work at [employer].  She has access to a stationary bike.  Her bp has been better controlled although her DBP is sl elevated today. Her PCP has decreased HCTZ due to renal insufficiency.   She has not been able to return to work given her ongoing deficits.   Pain Inventory Average Pain 4 Pain Right Now 4 My pain is intermittent and Numb, sharp, tightness  LOCATION OF PAIN  Right elbow, right hand, right ankle, right knee (behind), toes on right feet  BOWEL Number of stools per week: 7  BLADDER Normal    Mobility walk without assistance ability to climb steps?  yes do you drive?  yes Do you have any goals in this area?  yes  Function not employed: date last employed 02/2022 I need assistance with the following:  meal prep and household duties Do you have any goals in this area?  yes  Neuro/Psych weakness numbness trouble walking spasms confusion  Prior Studies Any changes since last visit?  no  Physicians involved in your care Any changes since last visit?  no   Family History  Problem Relation Age of Onset   Asthma Mother    Hypertension Mother    Cancer Mother        breast cancer   Hypertension Maternal Grandfather    Diabetes Maternal Grandfather    Hypertension Maternal Grandmother    Social History   Socioeconomic History   Marital status: Married    Spouse name: Not on file   Number of children: 3   Years of education: Not on file   Highest education level: Not on file   Occupational History   Occupation: makes medical hose and prostheses  Tobacco Use   Smoking status: Never   Smokeless tobacco: Never  Vaping Use   Vaping Use: Never used  Substance and Sexual Activity   Alcohol use: No    Alcohol/week: 0.0 standard drinks of alcohol   Drug use: No   Sexual activity: Not on file  Other Topics Concern   Not on file  Social History Narrative   Not on file   Social Determinants of Health   Financial Resource Strain: Not on file  Food Insecurity: Not on file  Transportation Needs: Not on file  Physical Activity: Not on file  Stress: Not on file  Social Connections: Not on file   Past Surgical History:  Procedure Laterality Date   CESAREAN SECTION     gyn cervical procedure     Past Medical History:  Diagnosis Date   Chronic insomnia    History of migraine headaches    Narcolepsy without cataplexy(347.00)    BP (!) 134/91   Pulse 75   Ht 5\' 4"  (1.626 m)   Wt 171 lb (77.6 kg)   SpO2 98%   BMI 29.35 kg/m   Opioid Risk Score:   Fall Risk Score:  `1  Depression screen  PHQ 2/9     08/05/2022   10:09 AM 02/09/2022   10:01 AM 12/17/2021    2:24 PM  Depression screen PHQ 2/9  Decreased Interest 0 0 0  Down, Depressed, Hopeless 0 0 0  PHQ - 2 Score 0 0 0  Altered sleeping   0  Tired, decreased energy   0  Change in appetite   0  Feeling bad or failure about yourself    0  Trouble concentrating   0  Moving slowly or fidgety/restless   0  Suicidal thoughts   0  PHQ-9 Score   0    Review of Systems  Musculoskeletal:  Positive for gait problem.       Spasms  Neurological:  Positive for weakness and numbness.  Psychiatric/Behavioral:  Positive for confusion.   All other systems reviewed and are negative.     Objective:   Physical Exam General: No acute distress HEENT: NCAT, EOMI, oral membranes moist Cards: reg rate  Chest: normal effort Abdomen: Soft, NT, ND Skin: dry, intact Extremities: no edema Psych: pleasant and  appropriate  Skin: intact Neuro: Alert and oriented x 3. Normal insight and awareness. Intact Memory. Normal language and speech. Cranial nerve exam unremarkable except for mild right facial numbness. RUE grossly 4+/5 prox to distaL mild intrinsic muscle weakness- stable in appearance.Marland Kitchen RLE  4+/5 prox to distal--a bit weaker at the ankle and toes.. Sensory remains 1/2 RUE and 1+/2 RLE for fine touch and pain. Mild right PD. Negative romberg test.  Gait is improving although she still does not strike her right heel very well and moves more in total than individually at each joint.  No resting tone. DTR's 2+ Musculoskeletal: normal rom.   Good  sitting posture           Assessment & Plan:  Medical Problem List and Plan: 1. Functional deficits secondary to left thalamic ICH likely secondary to hypertensive crisis             -We discussed sensory recovery at length.  She will need to work on regular and repetitive exercises for her right upper and lower extremities to improve her efficiency and movement as well as coordination.  There may be some limitations ultimately due to her sensory recovery.             --she's driving             -she cannot return to work at her prior level. She can only return at sedentary level however, given her ongoing sensory loss 2.   Pain Management/RUE spasms:               -tizanidine--continue 2mg  tid for any component of her presentation which are spasms.  Most of her sensations are related to her sensory loss more than frank spasticity.             - headaches resolved 3. Mood/Sleep/ADHD: metadate per primary 4. HTN: has been controlled. Continue per primary     30 minutes of face to face patient care time were spent during this visit. All questions were encouraged and answered. Follow up with me in 6 mos.

## 2022-08-05 NOTE — Patient Instructions (Signed)
ALWAYS FEEL FREE TO CALL OUR OFFICE WITH ANY PROBLEMS OR QUESTIONS [phone]  **PLEASE NOTE** ALL MEDICATION REFILL REQUESTS (INCLUDING CONTROLLED SUBSTANCES) NEED TO BE MADE AT LEAST 7 DAYS PRIOR TO REFILL BEING DUE. ANY REFILL REQUESTS INSIDE THAT TIME FRAME MAY RESULT IN DELAYS IN RECEIVING YOUR PRESCRIPTION.                    WORK ON REGULAR EXERCISES FOR YOUR RIGHT ARM, HAND, LEG, FEET.. WORK ON QUALITY MORE SO THAN QUANTITY

## 2022-08-10 ENCOUNTER — Telehealth: Payer: Self-pay | Admitting: Internal Medicine

## 2022-08-10 MED ORDER — METHYLPHENIDATE HCL ER 20 MG PO TBCR
EXTENDED_RELEASE_TABLET | ORAL | 0 refills | Status: DC
Start: 2022-08-10 — End: 2022-11-03

## 2022-08-10 MED ORDER — METHYLPHENIDATE HCL 5 MG PO TABS
ORAL_TABLET | ORAL | 0 refills | Status: DC
Start: 2022-08-10 — End: 2022-11-03

## 2022-08-10 NOTE — Telephone Encounter (Signed)
Ritalin and metadate refilled

## 2022-08-10 NOTE — Telephone Encounter (Signed)
Refills of Ritalin and Metadate ER have been sent to patients pharmacy. She is aware. NFN

## 2022-08-10 NOTE — Telephone Encounter (Signed)
Pt. Needs refills on RITALIN) 5 MG  andMETADATE ER) 20 MG  called into pharmacy

## 2022-08-12 ENCOUNTER — Encounter: Payer: Self-pay | Admitting: Physical Medicine & Rehabilitation

## 2022-08-12 MED ORDER — TIZANIDINE HCL 2 MG PO TABS: 2.0000 mg | ORAL_TABLET | Freq: Three times a day (TID) | ORAL | 2 refills | Status: DC

## 2022-08-29 NOTE — Progress Notes (Unsigned)
Subjective:    Patient ID: Denise Herrera, female    DOB: [birth year], 52 y.o.   MRN: [id number]  HPI  female never smoker followed for Narcolepsy without cataplexy, chronic insomnia, complicated by history of migraine NPSG and MSLT results consistent with narcolepsy syndrome in paper chart prior to 2009  -------------------------------------------------------------------------------------------   03/02/22- 52 year old female never smoker followed for Narcolepsy without cataplexy, chronic insomnia, complicated by history of migraine, HTN, CVA, gr1DD,   -Ritalin 10 mg 3x daily, methylphenidate ER 20 mg twice daily -Ambien CR 12.5 Body weight today Covid vax- none Flu vax-   declines Hosp 6/24-7/15-  intracerebral hemorrhage, HTN, > R side weakness, Rehab, She indicates significant recovery since CVA this summer which was attributed to hypertension.  No concerns about her stimulant medication reported.  She usually is taking 10 mg Ritalin 2 or 3 times daily.  Sometimes will take a 20 mg extended release if she is "active".  No seizures reported.  Napping most days.  Sleeps okay at night with Ambien.  09/01/22- 52 year old female never smoker followed for Narcolepsy without cataplexy, chronic insomnia, complicated by history of migraine, HTN, CVA/ ICH, Hemisensory Deficit, , gr1DD,   -Ritalin 10 mg 3x daily, methylphenidate ER 20 mg twice daily -Ambien CR 12.5 Body weight today 169 lbs Continues same meds and says they do well.  No acute cardiopulmonary issues. No new neurologic event. Remains out of work after CVA.  Review of Systems- see HPI  + = positive Constitutional:   No-   weight loss, night sweats, fevers, chills, +fatigue, lassitude. HEENT:   +  headaches, no-difficulty swallowing, tooth/dental problems, sore throat,       No-  sneezing, itching, ear ache, nasal congestion, post nasal drip,  CV:  No-   chest pain, orthopnea, PND, swelling in lower extremities, anasarca, dizziness,  palpitations Resp: No-   shortness of breath with exertion or at rest.              No-   productive cough,  No non-productive cough,  No-  coughing up of blood.              No-   change in color of mucus.  No- wheezing.   Skin: No-   rash or lesions.  GI:  No-   heartburn, indigestion, abdominal pain, nausea, vomiting,  GU: . MS:  No-   joint pain or swelling.   Neuro- nothing unusual Psych:  No- change in mood or affect. No depression or anxiety.  No memory loss    Objective:   Physical Exam General- Alert, Oriented, Affect-appropriate, Distress- none acute  Medium build. Appears well Skin- rash-none, lesions- none, excoriation- none; tatoos Lymphadenopathy- none Head- atraumatic            Eyes- Gross vision intact, PERRLA, conjunctivae clear secretions            Ears- Hearing, canals normal            Nose- Clear, NoSeptal dev, mucus, polyps, erosion, perforation             Throat- Mallampati II , mucosa clear , drainage- none, tonsils- atrophic Neck- flexible , trachea midline, no stridor , thyroid nl, carotid no bruit Chest - symmetrical excursion , unlabored           Heart/CV- RRR , no murmur , no gallop  , no rub, nl s1 s2                           -  JVD- none , edema- none, stasis changes- none, varices- none           Lung- clear to P&A, wheeze- none, cough- none , dullness-none, rub- none           Chest wall-  Abd-  Br/ Gen/ Rectal- Not done, not indicated Extrem- cyanosis- none, clubbing, none, atrophy- none, strength- nl Neuro- +mild smile asymmetry Assessment & Plan:

## 2022-09-01 ENCOUNTER — Ambulatory Visit (INDEPENDENT_AMBULATORY_CARE_PROVIDER_SITE_OTHER): Payer: Self-pay | Admitting: Internal Medicine

## 2022-09-01 ENCOUNTER — Encounter: Payer: Self-pay | Admitting: Internal Medicine

## 2022-09-01 VITALS — BP 110/64 | HR 88 | Ht 64.0 in | Wt 169.4 lb

## 2022-09-01 DIAGNOSIS — G4700 Insomnia, unspecified: Secondary | ICD-10-CM

## 2022-09-01 DIAGNOSIS — G47419 Narcolepsy without cataplexy: Secondary | ICD-10-CM

## 2022-09-01 NOTE — Assessment & Plan Note (Signed)
Common with narcolepsy- same difficulty maintaining sleep/wake. Plan- refill ambien when needed. Emphasize good sleep hygiene.

## 2022-09-01 NOTE — Assessment & Plan Note (Signed)
She does well with current meds used as directed and occasional nap. Plan- refill swhen needed

## 2022-09-01 NOTE — Patient Instructions (Signed)
We can continue current meds  Please call if we can help 

## 2022-09-23 ENCOUNTER — Ambulatory Visit: Payer: BLUE CROSS/BLUE SHIELD | Admitting: Physical Medicine & Rehabilitation

## 2022-09-23 ENCOUNTER — Other Ambulatory Visit: Payer: Self-pay | Admitting: Physical Medicine & Rehabilitation

## 2022-10-06 ENCOUNTER — Telehealth: Payer: Self-pay | Admitting: Internal Medicine

## 2022-10-06 MED ORDER — ZOLPIDEM TARTRATE ER 12.5 MG PO TBCR: 12.5000 mg | EXTENDED_RELEASE_TABLET | Freq: Every evening | ORAL | 5 refills | Status: DC | PRN

## 2022-10-06 NOTE — Telephone Encounter (Signed)
Called and spoke with pt to make sure I had the correct pharmacy showing that she needs her Ambien CR to be sent to. Stated to her that we would call her back once Rx has been refilled and she verbalized understanding.   Dr. Maple Hudson, please advise on refill.   Allergies  Allergen Reactions   Amlodipine Besylate     Dizziness, disequilibrium, fatigue Other reaction(s): Dizziness, disequilibrium and fatigue   Hydrochlorothiazide Other (See Comments)    Fatigue Laziness  Other reaction(s): fatigue/laziness   Influenza Vaccines     Other reaction(s): nausea and vomiting x 4-5 days   Lodine [Etodolac] Itching   Meloxicam     Other reaction(s): legs swell   Norvasc [Amlodipine] Other (See Comments)    Dizziness Disequilibrium Fatigue   Nuvigil [Armodafinil] Other (See Comments)    Headache    Vicodin [Hydrocodone-Acetaminophen] Itching   Lipitor [Atorvastatin] Other (See Comments)    Fatigue      Current Outpatient Medications:    acetaminophen (TYLENOL) 325 MG tablet, Take 2 tablets (650 mg total) by mouth every 4 (four) hours as needed for mild pain (or temp > 37.5 C (99.5 F))., Disp: , Rfl:    atorvastatin (LIPITOR) 20 MG tablet, Take 1 tablet (20 mg total) by mouth daily., Disp: 30 tablet, Rfl: 0   cetirizine (ZYRTEC) 10 MG tablet, Take 10 mg by mouth daily., Disp: , Rfl:    chlorthalidone (HYGROTON) 25 MG tablet, Take 25 mg by mouth daily. 1/2 tab, Disp: , Rfl:    cyclobenzaprine (FLEXERIL) 5 MG tablet, Take 5 mg by mouth 3 (three) times daily as needed., Disp: , Rfl:    eletriptan (RELPAX) 40 MG tablet, SMARTSIG:1 Tablet(s) By Mouth 1-2 Times Daily, Disp: , Rfl:    irbesartan (AVAPRO) 300 MG tablet, Take 1 tablet (300 mg total) by mouth daily., Disp: 30 tablet, Rfl: 0   methylphenidate (METADATE ER) 20 MG ER tablet, Twice daily as needed, Disp: 60 tablet, Rfl: 0   methylphenidate (RITALIN) 5 MG tablet, 1 tab 3 times daily if needed, Disp: 90 tablet, Rfl: 0   nebivolol  (BYSTOLIC) 5 MG tablet, Take 1 tablet (5 mg total) by mouth daily., Disp: 30 tablet, Rfl: 0   pantoprazole (PROTONIX) 40 MG tablet, Take 1 tablet (40 mg total) by mouth daily., Disp: 30 tablet, Rfl: 0   tiZANidine (ZANAFLEX) 2 MG tablet, TAKE 1 TABLET (2 MG TOTAL) BY MOUTH IN THE MORNING AND AT BEDTIME., Disp: 60 tablet, Rfl: 2   triamcinolone (NASACORT) 55 MCG/ACT AERO nasal inhaler, Place 2 sprays into the nose daily as needed (allergies)., Disp: , Rfl:    zolpidem (AMBIEN CR) 12.5 MG CR tablet, Take 1 tablet (12.5 mg total) by mouth at bedtime as needed., Disp: 30 tablet, Rfl: 5

## 2022-10-06 NOTE — Telephone Encounter (Signed)
Ambien refilled

## 2022-10-06 NOTE — Telephone Encounter (Signed)
Patient would like refill for Ambien CR. [facility]acy [facility] [address] Patient phone number is [phone].

## 2022-10-07 NOTE — Telephone Encounter (Signed)
Called and spoke with pt letting her know that CY refilled her medication and she verbalized understanding. Nothing further needed. 

## 2022-10-26 ENCOUNTER — Telehealth: Payer: Self-pay | Admitting: Internal Medicine

## 2022-10-26 NOTE — Telephone Encounter (Signed)
PT would like a 30 day supply of Ambien. States ins will only cover a certain amount of days.   [facility] [place]  Please call her to clarify med and dosage  [phone]  (She was hard for me to understand)

## 2022-10-28 MED ORDER — ZOLPIDEM TARTRATE ER 12.5 MG PO TBCR: 12.5000 mg | EXTENDED_RELEASE_TABLET | Freq: Every evening | ORAL | 5 refills | Status: DC | PRN

## 2022-10-28 MED ORDER — ZOLPIDEM TARTRATE ER 6.25 MG PO TBCR: EXTENDED_RELEASE_TABLET | ORAL | 5 refills | Status: DC

## 2022-10-28 NOTE — Telephone Encounter (Signed)
Spoke with patient. She states insurance will not cover Ambien 12.5mg  but will cover 6.25mg  two at bedtime Dr. Maple Hudson if this okay can you please send  [facility] [place]

## 2022-10-28 NOTE — Telephone Encounter (Signed)
Spoke with patient. Advised Denise Herrera has been sent to pharmacy. NFN

## 2022-10-28 NOTE — Telephone Encounter (Signed)
[facility]ien [facility]illed [facility]

## 2022-10-28 NOTE — Telephone Encounter (Signed)
Pt calling back again in regards to med refill for Ambien

## 2022-11-03 ENCOUNTER — Telehealth: Payer: Self-pay

## 2022-11-03 ENCOUNTER — Encounter: Payer: Self-pay | Admitting: Internal Medicine

## 2022-11-03 ENCOUNTER — Telehealth: Payer: Self-pay | Admitting: Internal Medicine

## 2022-11-03 MED ORDER — METHYLPHENIDATE HCL 5 MG PO TABS: ORAL_TABLET | ORAL | 0 refills | Status: DC

## 2022-11-03 MED ORDER — METHYLPHENIDATE HCL ER 20 MG PO TBCR: EXTENDED_RELEASE_TABLET | ORAL | 0 refills | Status: DC

## 2022-11-03 MED ORDER — TRAZODONE HCL 50 MG PO TABS: ORAL_TABLET | ORAL | 1 refills | Status: DC

## 2022-11-03 NOTE — Telephone Encounter (Signed)
*  Pulm  PA request received for Methylphenidate HCl ER 20MG  er tablets  PA submitted to [employer] via CMM and has been APPROVED   Key: [id number]

## 2022-11-03 NOTE — Telephone Encounter (Signed)
Dr. Maple Hudson can you please refill both of patients Methylphenidate prescriptions [facility]

## 2022-11-03 NOTE — Telephone Encounter (Signed)
Methylphenidate scripts refilled 

## 2022-11-03 NOTE — Telephone Encounter (Signed)
I have sent a script for trazodone to try instead of ambien for sleep if needed

## 2022-11-03 NOTE — Telephone Encounter (Signed)
Patient states needs refill for 2 meds. Ritalin and Metadate ER. [facility] is [facility] [address] Patient phone number is [phone].

## 2022-11-03 NOTE — Telephone Encounter (Signed)
Spoke with patient she is wanting another sleep aid. States she has been unable to get Ambien for several weeks now. She has contacted several pharmacies and no one has it available.    Dr. Maple Hudson please advise? [facility] [address]

## 2022-11-03 NOTE — Telephone Encounter (Signed)
*  Pulm  PA request received for Methylphenidate HCl 5MG  tablets  PA submitted to [employer] via CMM and has been APPROVED from 11/03/2022-11/02/2025  Key: [facility]

## 2022-11-04 NOTE — Telephone Encounter (Signed)
Spoke with patient. Advised Trazodone has been sent to pharmacy. Patient states she was able to get prescription yesterday. NFN

## 2022-11-23 ENCOUNTER — Telehealth: Payer: Self-pay | Admitting: Internal Medicine

## 2022-11-24 NOTE — Telephone Encounter (Signed)
Dr. Maple Hudson pt is requesting a substitute for trazodone. States she is experiencing crazy dreams. Please advise [facility] [address]

## 2022-11-25 MED ORDER — ESZOPICLONE 2 MG PO TABS: 2.0000 mg | ORAL_TABLET | Freq: Every evening | ORAL | 1 refills | Status: DC | PRN

## 2022-11-25 NOTE — Telephone Encounter (Signed)
She has not been able to get ambien from her drug store, and complained of "crazy dreams" from trazodone. I have sent script for lunesta to her drug store.

## 2022-11-25 NOTE — Telephone Encounter (Signed)
Called and spoke with pt to inform her Denise Herrera has been sent into pharmacy, pt verbalized understanding. NFN

## 2022-12-11 ENCOUNTER — Other Ambulatory Visit: Payer: Self-pay | Admitting: Physical Medicine & Rehabilitation

## 2022-12-21 ENCOUNTER — Encounter: Payer: Self-pay | Admitting: Physical Medicine & Rehabilitation

## 2022-12-21 ENCOUNTER — Other Ambulatory Visit: Payer: Self-pay | Admitting: Internal Medicine

## 2022-12-22 MED ORDER — METHYLPHENIDATE HCL 5 MG PO TABS: ORAL_TABLET | ORAL | 0 refills | Status: DC

## 2022-12-22 MED ORDER — METHYLPHENIDATE HCL ER 20 MG PO TBCR: EXTENDED_RELEASE_TABLET | ORAL | 0 refills | Status: DC

## 2022-12-22 NOTE — Telephone Encounter (Signed)
Methylphenidate scripts refilled 

## 2023-02-04 ENCOUNTER — Other Ambulatory Visit: Payer: Self-pay | Admitting: Physical Medicine & Rehabilitation

## 2023-02-08 ENCOUNTER — Telehealth: Payer: Self-pay | Admitting: Internal Medicine

## 2023-02-08 NOTE — Telephone Encounter (Signed)
Metadate Er 20 Mg  Metadate  ER 5mg   [facility] [address]  Please call in refill for this PT of these two meds.  Her # is [phone]

## 2023-02-10 ENCOUNTER — Encounter: Payer: 59 | Attending: Physical Medicine & Rehabilitation | Admitting: Physical Medicine & Rehabilitation

## 2023-02-10 ENCOUNTER — Encounter: Payer: Self-pay | Admitting: Physical Medicine & Rehabilitation

## 2023-02-10 VITALS — BP 120/81 | HR 66 | Ht 64.0 in | Wt 171.0 lb

## 2023-02-10 DIAGNOSIS — H905 Unspecified sensorineural hearing loss: Secondary | ICD-10-CM | POA: Diagnosis not present

## 2023-02-10 DIAGNOSIS — I610 Nontraumatic intracerebral hemorrhage in hemisphere, subcortical: Secondary | ICD-10-CM | POA: Insufficient documentation

## 2023-02-10 DIAGNOSIS — R29818 Other symptoms and signs involving the nervous system: Secondary | ICD-10-CM | POA: Insufficient documentation

## 2023-02-10 MED ORDER — BACLOFEN 10 MG PO TABS
5.0000 mg | ORAL_TABLET | Freq: Three times a day (TID) | ORAL | 2 refills | Status: DC | PRN
Start: 2023-02-10 — End: 2023-05-13

## 2023-02-10 NOTE — Patient Instructions (Signed)
ALWAYS FEEL FREE TO CALL OUR OFFICE WITH ANY PROBLEMS OR QUESTIONS ([phone])  **PLEASE NOTE** ALL MEDICATION REFILL REQUESTS (INCLUDING CONTROLLED SUBSTANCES) NEED TO BE MADE AT LEAST 7 DAYS PRIOR TO REFILL BEING DUE. ANY REFILL REQUESTS INSIDE THAT TIME FRAME MAY RESULT IN DELAYS IN RECEIVING YOUR PRESCRIPTION.                    

## 2023-02-10 NOTE — Progress Notes (Signed)
Subjective:    Patient ID: Denise Herrera, female    DOB: 1970/10/01, 52 y.o.   MRN: [id number]  HPI  Deveah is here in follow up of her left thalamic ICH. She has been doing fairly well from a pain standpoint. She still has right hemisensory deficits. However over the last 8-9 months, she has had "echoing" sensation in the right ear as well as decreased hearing. It really hasn't improved. Her balance hasn't changed. She denies pain although she is having increased cramps in right foot and toes. She tries to stretch and they still occur. Tizanidine isn't helping at all   Pain Inventory Average Pain 4 Pain Right Now 4 My pain is constant, burning, tingling, and aching  LOCATION OF PAIN  Right foot, right ankle, right knee, right arm, right leg  BOWEL Number of stools per week: 6   BLADDER Normal    Mobility ability to climb steps?  yes do you drive?  yes Do you have any goals in this area?  yes  Function disabled: date disabled applied  I need assistance with the following:  no needs Do you have any goals in this area?  yes  Neuro/Psych weakness numbness tingling trouble walking spasms  Prior Studies Any changes since last visit?  no  Physicians involved in your care Any changes since last visit?  no   Family History  Problem Relation Age of Onset   Asthma Mother    Hypertension Mother    Cancer Mother        breast cancer   Hypertension Maternal Grandfather    Diabetes Maternal Grandfather    Hypertension Maternal Grandmother    Social History   Socioeconomic History   Marital status: Married    Spouse name: Not on file   Number of children: 3   Years of education: Not on file   Highest education level: Not on file  Occupational History   Occupation: makes medical hose and prostheses  Tobacco Use   Smoking status: Never   Smokeless tobacco: Never  Vaping Use   Vaping status: Never Used  Substance and Sexual Activity   Alcohol use: No     Alcohol/week: 0.0 standard drinks of alcohol   Drug use: No   Sexual activity: Not on file  Other Topics Concern   Not on file  Social History Narrative   Not on file   Social Determinants of Health   Financial Resource Strain: Not on file  Food Insecurity: Not on file  Transportation Needs: Not on file  Physical Activity: Not on file  Stress: Not on file  Social Connections: Not on file   Past Surgical History:  Procedure Laterality Date   CESAREAN SECTION     gyn cervical procedure     Past Medical History:  Diagnosis Date   Chronic insomnia    History of migraine headaches    Narcolepsy without cataplexy(347.00)    Ht 5\' 4"  (1.626 m)   Wt 171 lb (77.6 kg)   BMI 29.35 kg/m   Opioid Risk Score:   Fall Risk Score:  `1  Depression screen [facility] 2/9     02/10/2023    9:45 AM 08/05/2022   10:09 AM 02/09/2022   10:01 AM 12/17/2021    2:24 PM  Depression screen PHQ 2/9  Decreased Interest 0 0 0 0  Down, Depressed, Hopeless 0 0 0 0  PHQ - 2 Score 0 0 0 0  Altered sleeping  0  Tired, decreased energy    0  Change in appetite    0  Feeling bad or failure about yourself     0  Trouble concentrating    0  Moving slowly or fidgety/restless    0  Suicidal thoughts    0  PHQ-9 Score    0    Review of Systems  Musculoskeletal:        Off balance spasms  Neurological:  Positive for weakness and numbness.  Psychiatric/Behavioral:         Tingling  All other systems reviewed and are negative.      Objective:   Physical Exam General: No acute distress HEENT: NCAT, EOMI, oral membranes moist, ear canal only with small amount of wax Cards: reg rate  Chest: normal effort Abdomen: Soft, NT, ND Skin: dry, intact Extremities: no edema Psych: pleasant and appropriate  Skin: intact Neuro: Alert and oriented x 3. Normal insight and awareness. Intact Memory. Normal language and speech. Cranial nerve exam unremarkable except for mild right facial numbness. RUE grossly  4+/5 prox to distaL mild intrinsic muscle weakness again. RLE  4+/5 prox to distal--a bit weaker still the ankle and toes.. Sensory remains 1/2 RUE and RLE. Mild right PD. Negative romberg test.  Gait fair but still doesn't perform full heel strike and push off, more fluid flow.  No resting tone. DTR's 2+ Musculoskeletal: normal rom.   reasonable posture           Assessment & Plan:  Medical Problem List and Plan: 1. Functional deficits secondary to left thalamic ICH likely secondary to hypertensive crisis             -continue with exercise, strengthening/stretches             --she's driving             -not recommending return to work given her ongoing weakness and sensory loss 2.   Pain Management/RUE spasms:               -tizanidine--stop this and try baclofen for cramping and spasms             - headaches resolved 3. Mood/Sleep/ADHD: metadate per primary 4. HTN: has been controlled. Continue per primary 5. Right sided hearing loss--ear canal is clear  -ENT referral     30+ minutes of face to face patient care time were spent during this visit including extensive paperwork. All questions were encouraged and answered. Follow up with me in 6 mos.

## 2023-02-11 MED ORDER — METHYLPHENIDATE HCL ER 20 MG PO TBCR: EXTENDED_RELEASE_TABLET | ORAL | 0 refills | Status: DC

## 2023-02-11 MED ORDER — METHYLPHENIDATE HCL 5 MG PO TABS: ORAL_TABLET | ORAL | 0 refills | Status: DC

## 2023-02-11 NOTE — Telephone Encounter (Signed)
Metadate scripts refilled

## 2023-02-13 ENCOUNTER — Other Ambulatory Visit: Payer: Self-pay | Admitting: Internal Medicine

## 2023-02-13 NOTE — Telephone Encounter (Signed)
Trazodone refilled.

## 2023-03-02 ENCOUNTER — Encounter (INDEPENDENT_AMBULATORY_CARE_PROVIDER_SITE_OTHER): Payer: Self-pay

## 2023-03-04 ENCOUNTER — Ambulatory Visit (INDEPENDENT_AMBULATORY_CARE_PROVIDER_SITE_OTHER): Payer: 59 | Admitting: Audiology

## 2023-03-04 ENCOUNTER — Encounter (INDEPENDENT_AMBULATORY_CARE_PROVIDER_SITE_OTHER): Payer: Self-pay

## 2023-03-04 ENCOUNTER — Ambulatory Visit (INDEPENDENT_AMBULATORY_CARE_PROVIDER_SITE_OTHER): Payer: 59 | Admitting: Otolaryngology

## 2023-03-04 VITALS — Ht 64.0 in | Wt 170.0 lb

## 2023-03-04 DIAGNOSIS — H9191 Unspecified hearing loss, right ear: Secondary | ICD-10-CM

## 2023-03-04 DIAGNOSIS — Z0110 Encounter for examination of ears and hearing without abnormal findings: Secondary | ICD-10-CM | POA: Diagnosis not present

## 2023-03-04 NOTE — Progress Notes (Signed)
Dear Dr. Riley Kill, Here is my assessment for our mutual patient, Denise Herrera. Thank you for allowing me the opportunity to care for your patient. Please do not hesitate to contact me should you have any other questions. Sincerely, Dr. Jovita Kussmaul  Otolaryngology Clinic Note Referring provider: Dr. Riley Kill HPI:  Denise Herrera is a 52 y.o. female kindly referred by Dr. Riley Kill for evaluation of right sided hearing issue. She feels like there is an echo on that side, and feels some tightness. Since her stroke in 2023 (round about). No pressure, tinnitus, pain, otorrhea, vertigo. No barotrauma, vestibular suppressant use. Nothing makes it better, nothing makes it worse. Denies deep pain in the ear, facial numbness No ear surgery. No autophony, sound of pressure induced vertigo.  No prior H&N surgery.   PMHx: Left thalamic stroke, HTN, Renal insufficiency, Insomnia, HLD   Independent Review of Additional Tests or Records:  [facility] 11/03/21: no mastoid or ME effusion; cuts thick but no gross OC or inner ear abnormality noted MRI 11/04/21: No retrocochlear lesions noted; no ME effusion   Audio 10/17 independently interpreted: normal hearing thresholdsb/l, symmetric; SRT 10, 100% at 50 dB b/l. Typa A tymps b/l  PMH/Meds/All/SocHx/FamHx/ROS:   Past Medical History:  Diagnosis Date   Chronic insomnia    History of migraine headaches    Narcolepsy without cataplexy(347.00)     Past Surgical History:  Procedure Laterality Date   CESAREAN SECTION     gyn cervical procedure      Family History  Problem Relation Age of Onset   Asthma Mother    Hypertension Mother    Cancer Mother        breast cancer   Hypertension Maternal Grandfather    Diabetes Maternal Grandfather    Hypertension Maternal Grandmother    No family history of bleeding disorders or difficulty with anesthesia  Social Connections: Not on file      Current Outpatient Medications:    traZODone (DESYREL) 50 MG tablet, TAKE  1 TO 2 TABLETS BY MOUTH AS NEEDED FOR SLEEP, Disp: 50 tablet, Rfl: 5   acetaminophen (TYLENOL) 325 MG tablet, Take 2 tablets (650 mg total) by mouth every 4 (four) hours as needed for mild pain (or temp > 37.5 C (99.5 F))., Disp: , Rfl:    atorvastatin (LIPITOR) 20 MG tablet, Take 1 tablet (20 mg total) by mouth daily., Disp: 30 tablet, Rfl: 0   baclofen (LIORESAL) 10 MG tablet, Take 0.5-1 tablets (5-10 mg total) by mouth 3 (three) times daily as needed for muscle spasms., Disp: 90 each, Rfl: 2   cetirizine (ZYRTEC) 10 MG tablet, Take 10 mg by mouth daily., Disp: , Rfl:    chlorthalidone (HYGROTON) 25 MG tablet, Take 25 mg by mouth daily. 1/2 tab, Disp: , Rfl:    eletriptan (RELPAX) 40 MG tablet, SMARTSIG:1 Tablet(s) By Mouth 1-2 Times Daily, Disp: , Rfl:    eszopiclone (LUNESTA) 2 MG TABS tablet, Take 1 tablet (2 mg total) by mouth at bedtime as needed for sleep. Take immediately before bedtime, Disp: 30 tablet, Rfl: 1   irbesartan (AVAPRO) 300 MG tablet, Take 1 tablet (300 mg total) by mouth daily., Disp: 30 tablet, Rfl: 0   methylphenidate (METADATE ER) 20 MG ER tablet, Twice daily as needed, Disp: 60 tablet, Rfl: 0   methylphenidate (RITALIN) 5 MG tablet, 1 tab 3 times daily if needed, Disp: 90 tablet, Rfl: 0   nebivolol (BYSTOLIC) 5 MG tablet, Take 1 tablet (5 mg total) by mouth  daily., Disp: 30 tablet, Rfl: 0   pantoprazole (PROTONIX) 40 MG tablet, Take 1 tablet (40 mg total) by mouth daily., Disp: 30 tablet, Rfl: 0   triamcinolone (NASACORT) 55 MCG/ACT AERO nasal inhaler, Place 2 sprays into the nose daily as needed (allergies)., Disp: , Rfl:    zolpidem (AMBIEN CR) 12.5 MG CR tablet, Take 1 tablet (12.5 mg total) by mouth at bedtime as needed., Disp: 30 tablet, Rfl: 5   zolpidem (AMBIEN CR) 6.25 MG CR tablet, 2 at bedtime for sleep, Disp: 60 tablet, Rfl: 5    Physical Exam:   Ht 5\' 4"  (1.626 m)   Wt 170 lb (77.1 kg)   BMI 29.18 kg/m    Salient findings:  CN II-XII intact Small  amount of cerumen, removed; Bilateral EAC clear and TM intact with well pneumatized middle ear spaces Weber 512: midline Rinne 512: AC > BC b/l  Rine 1024: AC > BC b/l  Anterior rhinoscopy: Septum relatively midline with mild deviation left No lesions of oral cavity/oropharynx; dentition fair No obviously palpable neck masses/lymphadenopathy/thyromegaly No respiratory distress or stridor No nystagmus Right sided weakness  Procedures:  Procedure: Bilateral ear microscopy using microscope (CPT 92504) Pre-procedure diagnosis: hearing loss concern Post-procedure diagnosis: same Indication: Concern for hearing loss; given patient's otologic complaints and history, for improved and comprehensive examination of external ear and tympanic membrane, bilateral otologic examination using microscope was performed  Procedure: Patient was placed semi-recumbent. Both ear canals were examined using the microscope with findings above. Some cerumen removed on left and on right using suction and currette.  Left: EAC was patent. TM was intact . Middle ear was aerated. Drainage: no Right: EAC was patent. TM was intact . Middle ear was aerated. Drainage: no Bilateral mild amount of cerumen removed Patient tolerated the procedure well.  Impression & Plans:  Denise Herrera is a 52 y.o. female with concern for right > L hearing loss after her thalamic stroke. She feels an intermittent echo on that side but no other symptoms including tinnitus. Audio reassuring today, and we have prior imaging after stroke without any obvious retrocochlear lesion. Discussed MRI but without asymmetric loss and no other symptoms, do not feel strongly and she wished to defer.  Right hearing loss concern - suspect sensation related to her stroke; exam and audio reassuring - f/u PRN; return precautions discussed    Thank you for allowing me the opportunity to care for your patient. Please do not hesitate to contact me should you have  any other questions.  Sincerely, Jovita Kussmaul, MD Otolarynoglogist (ENT), [facility] Phone: [phone] Fax: [phone]  03/04/2023, 9:47 AM

## 2023-03-04 NOTE — Progress Notes (Signed)
  [address] [place], [place] [address] [phone]             Audiological Evaluation   Name: NYSHA KOPLIN     DOB:   1970/06/01      MRN:   [id number]                                                                                     Service Date: 03/04/2023        Patient was referred today for a hearing evaluation by Dr. Allena Katz.  History Symptoms Yes/No Details  Hearing loss Yes Patient reported perceiving hearing loss where the right ear is worse than the left ear.  Tinnitus No Patient denied experiencing tinnitus.  Balance problems Yes Patient reported intermittent imbalance sensations.  Previous ear surgeries No Patient denied any previous ear surgeries.  Family history No Patient denied family history of hearing loss.  Amplification No Patient denied the use of hearing aids.    Tympanogram: Right ear: Normal external ear canal volume with normal middle ear pressure and low tympanic membrane compliance (Type As). Left ear: Normal external ear canal volume with normal middle ear pressure and low tympanic membrane compliance (Type As).    Hearing Evaluation: The audiogram was completed using conventional audiometric techniques under headphones with good reliability.   The hearing test results indicate: Right ear: Normal hearing sensitivity from [phone]. Left ear: Normal hearing sensitivity from [phone]. There is not a significant difference between the ears for pure tone thresholds.   Speech Recognition Thresholds were obtained at 10 dBHL in the right ear and 10 dBHL in the left ear.   Word Recognition Testing was completed using the NU-6 word lists at 50 dBHL in the right ear and at 50 dBHL in the left ear and the patient scored 100% in the right ear and 100% in the left ear.   Recommendations: Repeat audiogram when changes are perceived or per MD.    Conley Rolls Keeley Sussman, AUD, CCC-A 03/04/23

## 2023-03-09 ENCOUNTER — Telehealth: Payer: Self-pay | Admitting: Internal Medicine

## 2023-03-09 NOTE — Telephone Encounter (Signed)
Patient is calling because the medication that was prescribed is no longer in stock at her pharmacy. She needs an alternative to the methylphenidate 5mg . She has been without her medication for two weeks now.   [facility] [address]

## 2023-03-09 NOTE — Telephone Encounter (Signed)
I called the pt and there was no answer- LMTCB. ?

## 2023-03-11 NOTE — Telephone Encounter (Signed)
PT ret our call. Please try again. Her best # is [phone]  Dr. Waverly Ferrari says there are shortages but we can usually get an alternative.

## 2023-03-12 ENCOUNTER — Telehealth: Payer: Self-pay

## 2023-03-12 ENCOUNTER — Other Ambulatory Visit: Payer: Self-pay | Admitting: Pulmonary Disease

## 2023-03-12 ENCOUNTER — Other Ambulatory Visit (HOSPITAL_COMMUNITY): Payer: Self-pay

## 2023-03-12 MED ORDER — AMPHETAMINE-DEXTROAMPHET ER 10 MG PO CP24: 10.0000 mg | ORAL_CAPSULE | Freq: Two times a day (BID) | ORAL | 0 refills | Status: DC

## 2023-03-12 MED ORDER — AMPHETAMINE-DEXTROAMPHETAMINE 10 MG PO TABS: ORAL_TABLET | ORAL | 0 refills | Status: DC

## 2023-03-12 NOTE — Telephone Encounter (Signed)
Dr Maple Hudson- will you please advise on alternative for methylphenidate 5 mg? She can not longer get this at her pharmacy.

## 2023-03-12 NOTE — Telephone Encounter (Signed)
*  Pulm  Pharmacy Patient Advocate Encounter  Received notification from [facility] that Prior Authorization for Amphetamine-Dextroamphet ER 10MG  er capsules  has been APPROVED from 03/12/2023 to 03/11/2024. Ran test claim, Copay is $refill is too soon. This test claim was processed through [facility]- copay amounts may vary at other pharmacies due to pharmacy/plan contracts, or as the patient moves through the different stages of their insurance plan.   PA #/Case ID/Reference #: [id number]

## 2023-03-12 NOTE — Telephone Encounter (Signed)
Sent script for adderall 10 mg for now to replace ritalin 5 mg.

## 2023-03-12 NOTE — Telephone Encounter (Signed)
I called and spoke with the pt and notified of response per Dr Annamaria Boots She verbalized understanding  Nothing further needed

## 2023-03-23 ENCOUNTER — Emergency Department (HOSPITAL_COMMUNITY)
Admission: EM | Admit: 2023-03-23 | Discharge: 2023-03-24 | Disposition: A | Discharge status: Home / Self Care | Payer: 59 | Attending: Emergency Medicine | Admitting: Emergency Medicine

## 2023-03-23 ENCOUNTER — Other Ambulatory Visit: Payer: Self-pay

## 2023-03-23 ENCOUNTER — Emergency Department (HOSPITAL_COMMUNITY): Payer: 59

## 2023-03-23 DIAGNOSIS — R200 Anesthesia of skin: Secondary | ICD-10-CM | POA: Diagnosis not present

## 2023-03-23 DIAGNOSIS — R2990 Unspecified symptoms and signs involving the nervous system: Secondary | ICD-10-CM | POA: Diagnosis not present

## 2023-03-23 DIAGNOSIS — Z79899 Other long term (current) drug therapy: Secondary | ICD-10-CM | POA: Diagnosis not present

## 2023-03-23 DIAGNOSIS — N179 Acute kidney failure, unspecified: Secondary | ICD-10-CM | POA: Diagnosis not present

## 2023-03-23 DIAGNOSIS — R531 Weakness: Secondary | ICD-10-CM

## 2023-03-23 DIAGNOSIS — Z8673 Personal history of transient ischemic attack (TIA), and cerebral infarction without residual deficits: Secondary | ICD-10-CM | POA: Diagnosis not present

## 2023-03-23 LAB — I-STAT CHEM 8, ED
BUN: 24 mg/dL — ABNORMAL HIGH (ref 6–20)
Calcium, Ion: 1.11 mmol/L — ABNORMAL LOW (ref 1.15–1.40)
Chloride: 101 mmol/L (ref 98–111)
Creatinine, Ser: 2 mg/dL — ABNORMAL HIGH (ref 0.44–1.00)
Glucose, Bld: 95 mg/dL (ref 70–99)
HCT: 37 % (ref 36.0–46.0)
Hemoglobin: 12.6 g/dL (ref 12.0–15.0)
Potassium: 3 mmol/L — ABNORMAL LOW (ref 3.5–5.1)
Sodium: 138 mmol/L (ref 135–145)
TCO2: 26 mmol/L (ref 22–32)

## 2023-03-23 LAB — BASIC METABOLIC PANEL
Anion gap: 11 (ref 5–15)
BUN: 23 mg/dL — ABNORMAL HIGH (ref 6–20)
CO2: 26 mmol/L (ref 22–32)
Calcium: 9.7 mg/dL (ref 8.9–10.3)
Chloride: 101 mmol/L (ref 98–111)
Creatinine, Ser: 1.87 mg/dL — ABNORMAL HIGH (ref 0.44–1.00)
GFR, Estimated: 32 mL/min — ABNORMAL LOW (ref >60)
Glucose, Bld: 96 mg/dL (ref 70–99)
Potassium: 3.1 mmol/L — ABNORMAL LOW (ref 3.5–5.1)
Sodium: 138 mmol/L (ref 135–145)

## 2023-03-23 LAB — CBG MONITORING, ED: Glucose-Capillary: 81 mg/dL (ref 70–99)

## 2023-03-23 LAB — CBC
HCT: 37.3 % (ref 36.0–46.0)
Hemoglobin: 12.6 g/dL (ref 12.0–15.0)
MCH: 30.7 pg (ref 26.0–34.0)
MCHC: 33.8 g/dL (ref 30.0–36.0)
MCV: 91 fL (ref 80.0–100.0)
Platelets: 305 10*3/uL (ref 150–400)
RBC: 4.1 MIL/uL (ref 3.87–5.11)
RDW: 12.5 % (ref 11.5–15.5)
WBC: 7 10*3/uL (ref 4.0–10.5)
nRBC: 0 % (ref 0.0–0.2)

## 2023-03-23 LAB — PROTIME-INR
INR: 1 (ref 0.8–1.2)
Prothrombin Time: 13.4 s (ref 11.4–15.2)

## 2023-03-23 LAB — APTT: aPTT: 29 s (ref 24–36)

## 2023-03-23 MED ORDER — POTASSIUM CHLORIDE CRYS ER 20 MEQ PO TBCR
40.0000 meq | EXTENDED_RELEASE_TABLET | Freq: Once | ORAL | Status: AC
  Administered 2023-03-23: 40 meq via ORAL
  Filled 2023-03-23: qty 2

## 2023-03-23 MED ORDER — ASPIRIN 81 MG PO CHEW
324.0000 mg | CHEWABLE_TABLET | Freq: Once | ORAL | Status: AC
Start: 2023-03-24 — End: 2023-03-23
  Administered 2023-03-23: 324 mg via ORAL
  Filled 2023-03-23: qty 4

## 2023-03-23 MED ORDER — IOHEXOL 350 MG/ML SOLN
60.0000 mL | Freq: Once | INTRAVENOUS | Status: AC | PRN
  Administered 2023-03-23: 60 mL via INTRAVENOUS

## 2023-03-23 NOTE — ED Provider Notes (Signed)
[facility] Emergency Department Provider Note MRN:  [id number]  Arrival date & time: 03/24/23     Chief Complaint   Numbness (R Arm) and Code Stroke   History of Present Illness   Denise Herrera is a 52 y.o. year-old female presents to the ED with chief complaint of right sided numbness that started about 2 hours ago.  Now states that she has right sided weakness as well.  Reports prior history of stroke.  States that she takes lipitor, but not on aspirin or plavix.  She denies any speech or vision changes.  Denies any recent illnesses.  History provided by patient.   Review of Systems  Pertinent positive and negative review of systems noted in HPI.    Physical Exam   Vitals:   03/24/23 0555 03/24/23 0600  BP:  119/74  Pulse:  (!) 49  Resp:  20  Temp: 98 F (36.7 C)   SpO2:  100%    CONSTITUTIONAL:  non toxic-appearing, NAD NEURO:  Alert and oriented x 3, CN 3-12 grossly intact, decreased sensation in RUE and RLE, normal finger to nose and heel to shin, no pronator drift EYES:  eyes equal and reactive ENT/NECK:  Supple, no stridor  CARDIO:  normal rate, regular rhythm, appears well-perfused  PULM:  No respiratory distress, CTAB GI/GU:  non-distended,  MSK/SPINE:  decreased strength in RLE.  Required 2 person assist from wheel chair to CT table SKIN:  no rash, atraumatic   *Additional and/or pertinent findings included in MDM below  Diagnostic and Interventional Summary    EKG Interpretation Date/Time:    Ventricular Rate:    PR Interval:    QRS Duration:    QT Interval:    QTC Calculation:   R Axis:      Text Interpretation:         Labs Reviewed  BASIC METABOLIC PANEL - Abnormal; Notable for the following components:      Result Value   Potassium 3.1 (*)    BUN 23 (*)    Creatinine, Ser 1.87 (*)    GFR, Estimated 32 (*)    All other components within normal limits  URINALYSIS, ROUTINE W REFLEX MICROSCOPIC - Abnormal;  Notable for the following components:   Color, Urine STRAW (*)    All other components within normal limits  COMPREHENSIVE METABOLIC PANEL - Abnormal; Notable for the following components:   Potassium 3.0 (*)    BUN 23 (*)    Creatinine, Ser 1.81 (*)    Total Bilirubin 1.2 (*)    GFR, Estimated 33 (*)    All other components within normal limits  I-STAT CHEM 8, ED - Abnormal; Notable for the following components:   Potassium 3.0 (*)    BUN 24 (*)    Creatinine, Ser 2.00 (*)    Calcium, Ion 1.11 (*)    All other components within normal limits  RESP PANEL BY RT-PCR (RSV, FLU A&B, COVID)  RVPGX2  CBC  ETHANOL  PROTIME-INR  APTT  DIFFERENTIAL  CBC  RAPID URINE DRUG SCREEN, HOSP PERFORMED  CBG MONITORING, ED    US RENAL  Final Result    MR BRAIN WO CONTRAST  Final Result    CT ANGIO HEAD NECK W WO CM (CODE STROKE)  Final Result    CT HEAD CODE STROKE WO CONTRAST  Final Result      Medications  iohexol (OMNIPAQUE) 350 MG/ML injection 60 mL (60 mLs Intravenous Contrast Given 03/23/23  2318)  potassium chloride SA (KLOR-CON M) CR tablet 40 mEq (40 mEq Oral Given 03/23/23 2358)  aspirin chewable tablet 324 mg (324 mg Oral Given 03/23/23 2358)  LORazepam (ATIVAN) tablet 1 mg (1 mg Oral Given 03/24/23 0116)     Procedures  /  Critical Care .Critical Care  Performed by: Roxy Horseman, PA-C Authorized by: Roxy Horseman, PA-C   Critical care provider statement:    Critical care time (minutes):  38   Critical care was necessary to treat or prevent imminent or life-threatening deterioration of the following conditions:  CNS failure or compromise   Critical care was time spent personally by me on the following activities:  Development of treatment plan with patient or surrogate, discussions with consultants, evaluation of patient's response to treatment, examination of patient, ordering and review of laboratory studies, ordering and review of radiographic studies, ordering and  performing treatments and interventions, pulse oximetry, re-evaluation of patient's condition and review of old charts   ED Course and Medical Decision Making  I have reviewed the triage vital signs, the nursing notes, and pertinent available records from the EMR.  Social Determinants Affecting Complexity of Care: Patient has no clinically significant social determinants affecting this chief complaint..   ED Course: Clinical Course as of 03/24/23 0617  Tue Mar 23, 2023  2250 I MSE'd the patient.  She has right sided numbness x 2 hours.  She also has some weakness on my exam that she says is new.  I'll consult neurology. [RB]  2301 I consulted with Dr. Derry Lory, who recommends activating code stroke if RLE weakness is new. [RB]  2306 I confirmed with patient that RLE weakness is new. She does seem a bit weaker 4/5 strength on my exam. Code stroke activated due to new weakness.  Clear for CT. [RB]    Clinical Course User Index [RB] Roxy Horseman, PA-C    Medical Decision Making Patient here with right-sided numbness.  I saw her in triage and she also had some right-sided weakness on my exam.  She was within the code stroke window.  I discussed the case with neurology, and code stroke was activated.  Patient was not a candidate for TNK due to prior brain bleed approximately 1 year ago.  Patient seen by and discussed with Dr. Derry Lory, who recommends MRI brain.  If no abnormality, then no further workup from neurology standpoint.  MRI negative for acute stroke.  She does have new AKI.  Renal ultrasound was ordered, but shows no hydronephrosis.  She does not have any UTI symptoms and does not have worrisome urinalysis.  Patient discussed with Dr. Madilyn Hook, who recommends having patient hold for her chlorthalidone, avoid ibuprofen, and follow-up with her PCP in the next week for recheck of creatinine.  I have also encouraged the patient to stay well-hydrated.  Patient is understanding and  agreeable with the plan.  She is stable for discharge.  Amount and/or Complexity of Data Reviewed Labs: ordered. Radiology: ordered.  Risk OTC drugs. Prescription drug management.         Consultants: I consulted with Dr. Derry Lory, who recommends MRI brain.  Hold TNK.  Prior history of brain bleed.   Treatment and Plan: I considered admission due to patient's initial presentation, but after considering the examination and diagnostic results, patient will not require admission and can be discharged with outpatient follow-up.    Final Clinical Impressions(s) / ED Diagnoses     ICD-10-CM   1. Numbness  R20.0  2. Weakness  R53.1     3. AKI (acute kidney injury) (HCC)  N17.9       ED Discharge Orders     None         Discharge Instructions Discussed with and Provided to Patient:     Discharge Instructions      Your CT scans and MRI didn't show any new strokes.  Your kidney function is decreased compared to your baseline.  Please hold your chlorthalidone and avoid ibuprofen until you follow-up with your doctor.  You need to be certain to stay well hydrated.       Roxy Horseman, PA-C 03/24/23 0617    Tilden Fossa, MD 03/24/23 [phone]

## 2023-03-23 NOTE — ED Provider Triage Note (Signed)
  Emergency Medicine Provider Triage Evaluation Note  MRN:  [id number]  Arrival date & time: 03/23/23    Medically screening exam initiated at 10:50 PM.   CC:   Numbness (R Arm)   HPI:  Denise Herrera is a 52 y.o. year-old female presents to the ED with chief complaint of right arm numbness.  Onset 2 hours ago.  Denies weakness.  History provided by patient. ROS:  -As included in HPI PE:   Vitals:   03/23/23 2203  BP: (!) 136/92  Pulse: 70  Resp: 15  Temp: 97.9 F (36.6 C)  SpO2: 100%    Non-toxic appearing No respiratory distress CN 3-12 intact RLE weakness 4/5 MDM:  Concern for stroke with new weakness on my exam.  Will consult neurology. See my full note for details.  Patient was informed that the remainder of the evaluation will be completed by another provider, this initial triage assessment does not replace that evaluation, and the importance of remaining in the ED until their evaluation is complete.    Roxy Horseman, PA-C 03/23/23 2358

## 2023-03-23 NOTE — ED Notes (Signed)
Pt remained in triage room 4 awaiting for MSE. Provider MSE pt and activated a Code Stroke. Pt was taken back to CT 1. When transferring pt from recliner to wheelchair. Noted increased difficulty with mobility and worsening weakness. Verbal handoff given to [facility].

## 2023-03-23 NOTE — Consult Note (Signed)
NEUROLOGY CONSULT NOTE   Date of service: March 23, 2023 Patient Name: Denise Herrera MRN:  [id number] DOB:  04-25-1971 Chief Complaint: "worsening baseline R sided weakness and numbness" Requesting Provider: Tilden Fossa, MD  History of Present Illness  Denise Herrera is a 52 y.o. female with prior L thalamic hemorrhage, HTN, HLD, narcolepsy who presents with worsening of prior R sided numbness and weakness.  Symptoms started around 2100.  LKW: 2100 Modified rankin score: 0-Completely asymptomatic and back to baseline post- stroke IV Thrombolysis: not offered 2/2 prior hx of stroke. EVT: low suspicion for LVO.  NIHSS components Score: Comment  1a Level of Conscious 0[x]  1[]  2[]  3[]      1b LOC Questions 0[x]  1[]  2[]       1c LOC Commands 0[x]  1[]  2[]       2 Best Gaze 0[x]  1[]  2[]       3 Visual 0[x]  1[]  2[]  3[]      4 Facial Palsy 0[x]  1[]  2[]  3[]      5a Motor Arm - left 0[x]  1[]  2[]  3[]  4[]  UN[]    5b Motor Arm - Right 0[x]  1[]  2[]  3[]  4[]  UN[]    6a Motor Leg - Left 0[x]  1[]  2[]  3[]  4[]  UN[]    6b Motor Leg - Right 0[]  1[x]  2[]  3[]  4[]  UN[]    7 Limb Ataxia 0[x]  1[]  2[]  3[]  UN[]     8 Sensory 0[]  1[]  2[x]  UN[]      9 Best Language 0[x]  1[]  2[]  3[]      10 Dysarthria 0[x]  1[]  2[]  UN[]      11 Extinct. and Inattention 0[x]  1[]  2[]       TOTAL: 3      ROS  Comprehensive ROS performed and pertinent positives documented in HPI  Past History   Past Medical History:  Diagnosis Date   Chronic insomnia    History of migraine headaches    Narcolepsy without cataplexy(347.00)     Past Surgical History:  Procedure Laterality Date   CESAREAN SECTION     gyn cervical procedure      Family History: Family History  Problem Relation Age of Onset   Asthma Mother    Hypertension Mother    Cancer Mother        breast cancer   Hypertension Maternal Grandfather    Diabetes Maternal Grandfather    Hypertension Maternal Grandmother     Social History  reports that she has never  smoked. She has never used smokeless tobacco. She reports that she does not drink alcohol and does not use drugs.  Allergies  Allergen Reactions   Amlodipine Besylate     Dizziness, disequilibrium, fatigue Other reaction(s): Dizziness, disequilibrium and fatigue   Hydrochlorothiazide Other (See Comments)    Fatigue Laziness  Other reaction(s): fatigue/laziness   Influenza Vaccines     Other reaction(s): nausea and vomiting x 4-5 days   Lodine [Etodolac] Itching   Meloxicam     Other reaction(s): legs swell   Norvasc [Amlodipine] Other (See Comments)    Dizziness Disequilibrium Fatigue   Nuvigil [Armodafinil] Other (See Comments)    Headache    Vicodin [Hydrocodone-Acetaminophen] Itching   Lipitor [Atorvastatin] Other (See Comments)    Fatigue     Medications  No current facility-administered medications for this encounter.  Current Outpatient Medications:    acetaminophen (TYLENOL) 325 MG tablet, Take 2 tablets (650 mg total) by mouth every 4 (four) hours as needed for mild pain (or temp > 37.5 C (99.5 F))., Disp: , Rfl:  amphetamine-dextroamphetamine (ADDERALL XR) 10 MG 24 hr capsule, Take 1 capsule (10 mg total) by mouth 2 (two) times daily., Disp: 60 capsule, Rfl: 0   amphetamine-dextroamphetamine (ADDERALL) 10 MG tablet, 1 twice daily as needed, Disp: 60 tablet, Rfl: 0   atorvastatin (LIPITOR) 20 MG tablet, Take 1 tablet (20 mg total) by mouth daily., Disp: 30 tablet, Rfl: 0   baclofen (LIORESAL) 10 MG tablet, Take 0.5-1 tablets (5-10 mg total) by mouth 3 (three) times daily as needed for muscle spasms., Disp: 90 each, Rfl: 2   cetirizine (ZYRTEC) 10 MG tablet, Take 10 mg by mouth daily., Disp: , Rfl:    chlorthalidone (HYGROTON) 25 MG tablet, Take 25 mg by mouth daily. 1/2 tab, Disp: , Rfl:    eletriptan (RELPAX) 40 MG tablet, SMARTSIG:1 Tablet(s) By Mouth 1-2 Times Daily, Disp: , Rfl:    eszopiclone (LUNESTA) 2 MG TABS tablet, Take 1 tablet (2 mg total) by mouth at  bedtime as needed for sleep. Take immediately before bedtime (Patient not taking: Reported on 03/04/2023), Disp: 30 tablet, Rfl: 1   irbesartan (AVAPRO) 300 MG tablet, Take 1 tablet (300 mg total) by mouth daily., Disp: 30 tablet, Rfl: 0   methylphenidate (METADATE ER) 20 MG ER tablet, Twice daily as needed, Disp: 60 tablet, Rfl: 0   methylphenidate (RITALIN) 5 MG tablet, 1 tab 3 times daily if needed, Disp: 90 tablet, Rfl: 0   nebivolol (BYSTOLIC) 5 MG tablet, Take 1 tablet (5 mg total) by mouth daily., Disp: 30 tablet, Rfl: 0   pantoprazole (PROTONIX) 40 MG tablet, Take 1 tablet (40 mg total) by mouth daily., Disp: 30 tablet, Rfl: 0   traZODone (DESYREL) 50 MG tablet, TAKE 1 TO 2 TABLETS BY MOUTH AS NEEDED FOR SLEEP, Disp: 50 tablet, Rfl: 5   triamcinolone (NASACORT) 55 MCG/ACT AERO nasal inhaler, Place 2 sprays into the nose daily as needed (allergies). (Patient not taking: Reported on 03/04/2023), Disp: , Rfl:    zolpidem (AMBIEN CR) 12.5 MG CR tablet, Take 1 tablet (12.5 mg total) by mouth at bedtime as needed., Disp: 30 tablet, Rfl: 5   zolpidem (AMBIEN CR) 6.25 MG CR tablet, 2 at bedtime for sleep, Disp: 60 tablet, Rfl: 5  Vitals   Vitals:   03/23/23 2203  BP: (!) 136/92  Pulse: 70  Resp: 15  Temp: 97.9 F (36.6 C)  TempSrc: Oral  SpO2: 100%  Weight: 77.6 kg  Height: 5\' 4"  (1.626 m)    Body mass index is 29.35 kg/m.  Physical Exam   Constitutional: Appears well-developed and well-nourished.  Psych: Affect appropriate to situation.  Eyes: No scleral injection.  HENT: No OP obstruction.  Head: Normocephalic.  Cardiovascular: Normal rate and regular rhythm.  Respiratory: Effort normal, non-labored breathing.  GI: Soft.  No distension. There is no tenderness.  Skin: WDI.   Neurologic Examination  Mental status/Cognition: Alert, oriented to self, place, month and year, good attention.  Speech/language: Fluent, comprehension intact, object naming intact, repetition intact.   Cranial nerves:   CN II Pupils equal and reactive to light, no VF deficits    CN III,IV,VI EOM intact, no gaze preference or deviation, no nystagmus    CN V normal sensation in V1, V2, and V3 segments bilaterally    CN VII no asymmetry, no nasolabial fold flattening    CN VIII normal hearing to speech    CN IX & X normal palatal elevation, no uvular deviation    CN XI 5/5 head turn and  5/5 shoulder shrug bilaterally    CN XII midline tongue protrusion    Motor:  Muscle bulk: normal, tone some spasticity in RUE and RLE Mvmt Root Nerve  Muscle Right Left Comments  SA C5/6 Ax Deltoid 5 5   EF C5/6 Mc Biceps 4+ 5   EE C6/7/8 Rad Triceps 4+ 5   WF C6/7 Med FCR     WE C7/8 PIN ECU     F Ab C8/T1 U ADM/FDI 4+ 5   HF L1/2/3 Fem Illopsoas 4+ 5   KE L2/3/4 Fem Quad     DF L4/5 D Peron Tib Ant 5 5   PF S1/2 Tibial Grc/Sol 5 5    Sensation:  Light touch Decreased to light touch in R face, R arm and R leg.   Pin prick    Temperature    Vibration   Proprioception    Coordination/Complex Motor:  - Finger to Nose intact BL - Heel to shin *** - Rapid alternating movement *** - Gait: Stride length ***. Arm swing ***. Base width ***   Labs/Imaging/Neurodiagnostic studies   CBC:  Recent Labs  Lab April 11, 2023 2222  WBC 7.0  HGB 12.6  HCT 37.3  MCV 91.0  PLT 305    Basic Metabolic Panel:  Lab Results  Component Value Date   NA 138 Apr 11, 2023   K 3.1 (L) 2023-04-11   CO2 26 04-11-2023   GLUCOSE 96 11-Apr-2023   BUN 23 (H) April 11, 2023   CREATININE 1.87 (H) 2023-04-11   CALCIUM 9.7 04-11-2023   GFRNONAA 32 (L) 2023/04/11    Lipid Panel:  Lab Results  Component Value Date   LDLCALC 142 (H) 11/03/2021    HgbA1c:  Lab Results  Component Value Date   HGBA1C 5.3 11/03/2021    Urine Drug Screen: No results found for: "LABOPIA", "COCAINSCRNUR", "LABBENZ", "AMPHETMU", "THCU", "LABBARB"   Alcohol Level     Component Value Date/Time   ETH <10 11/03/2021 1545    INR  Lab  Results  Component Value Date   INR 1.0 11/03/2021    APTT  Lab Results  Component Value Date   APTT 31 11/03/2021    AED levels: No results found for: "PHENYTOIN", "ZONISAMIDE", "LAMOTRIGINE", "LEVETIRACETA"    CT Head without contrast(Personally reviewed): ***  CT angio Head and Neck with contrast(Personally reviewed): ***  MR Angio head without contrast and Carotid Duplex BL(Personally reviewed): ***  MRI Brain(Personally reviewed): ***  Neurodiagnostics rEEG:  ***  Impression   Denise Herrera is a 52 y.o. female  has a past medical history of Chronic insomnia, History of migraine headaches, and Narcolepsy without cataplexy(347.00).  Recommendations  *** ______________________________________________________________________    Welton Flakes [facility]

## 2023-03-23 NOTE — ED Triage Notes (Signed)
Pt reports new onset of right sided numbness that started two hours ago. VAN neg. A&Ox4.

## 2023-03-24 ENCOUNTER — Emergency Department (HOSPITAL_COMMUNITY): Payer: 59

## 2023-03-24 LAB — CBC
HCT: 38 % (ref 36.0–46.0)
Hemoglobin: 12.6 g/dL (ref 12.0–15.0)
MCH: 30.5 pg (ref 26.0–34.0)
MCHC: 33.2 g/dL (ref 30.0–36.0)
MCV: 92 fL (ref 80.0–100.0)
Platelets: 276 10*3/uL (ref 150–400)
RBC: 4.13 MIL/uL (ref 3.87–5.11)
RDW: 12.6 % (ref 11.5–15.5)
WBC: 6.3 10*3/uL (ref 4.0–10.5)
nRBC: 0 % (ref 0.0–0.2)

## 2023-03-24 LAB — URINALYSIS, ROUTINE W REFLEX MICROSCOPIC
Bilirubin Urine: NEGATIVE
Glucose, UA: NEGATIVE mg/dL
Hgb urine dipstick: NEGATIVE
Ketones, ur: NEGATIVE mg/dL
Leukocytes,Ua: NEGATIVE
Nitrite: NEGATIVE
Protein, ur: NEGATIVE mg/dL
Specific Gravity, Urine: 1.027 (ref 1.005–1.030)
pH: 5 (ref 5.0–8.0)

## 2023-03-24 LAB — COMPREHENSIVE METABOLIC PANEL
ALT: 27 U/L (ref 0–44)
AST: 36 U/L (ref 15–41)
Albumin: 3.8 g/dL (ref 3.5–5.0)
Alkaline Phosphatase: 74 U/L (ref 38–126)
Anion gap: 13 (ref 5–15)
BUN: 23 mg/dL — ABNORMAL HIGH (ref 6–20)
CO2: 25 mmol/L (ref 22–32)
Calcium: 9.6 mg/dL (ref 8.9–10.3)
Chloride: 100 mmol/L (ref 98–111)
Creatinine, Ser: 1.81 mg/dL — ABNORMAL HIGH (ref 0.44–1.00)
GFR, Estimated: 33 mL/min — ABNORMAL LOW (ref >60)
Glucose, Bld: 96 mg/dL (ref 70–99)
Potassium: 3 mmol/L — ABNORMAL LOW (ref 3.5–5.1)
Sodium: 138 mmol/L (ref 135–145)
Total Bilirubin: 1.2 mg/dL — ABNORMAL HIGH (ref <1.2)
Total Protein: 7 g/dL (ref 6.5–8.1)

## 2023-03-24 LAB — RAPID URINE DRUG SCREEN, HOSP PERFORMED
Amphetamines: POSITIVE — AB
Barbiturates: NOT DETECTED
Benzodiazepines: NOT DETECTED
Cocaine: NOT DETECTED
Opiates: NOT DETECTED
Tetrahydrocannabinol: NOT DETECTED

## 2023-03-24 LAB — ETHANOL: Alcohol, Ethyl (B): <10 mg/dL (ref <10)

## 2023-03-24 LAB — DIFFERENTIAL
Abs Immature Granulocytes: 0.01 10*3/uL (ref 0.00–0.07)
Basophils Absolute: 0 10*3/uL (ref 0.0–0.1)
Basophils Relative: 1 %
Eosinophils Absolute: 0.1 10*3/uL (ref 0.0–0.5)
Eosinophils Relative: 1 %
Immature Granulocytes: 0 %
Lymphocytes Relative: 54 %
Lymphs Abs: 3.5 10*3/uL (ref 0.7–4.0)
Monocytes Absolute: 0.4 10*3/uL (ref 0.1–1.0)
Monocytes Relative: 7 %
Neutro Abs: 2.3 10*3/uL (ref 1.7–7.7)
Neutrophils Relative %: 37 %

## 2023-03-24 MED ORDER — LORAZEPAM 1 MG PO TABS
1.0000 mg | ORAL_TABLET | Freq: Once | ORAL | Status: AC
  Administered 2023-03-24: 1 mg via ORAL
  Filled 2023-03-24: qty 1

## 2023-03-24 NOTE — Discharge Instructions (Signed)
Your CT scans and MRI didn't show any new strokes.  Your kidney function is decreased compared to your baseline.  Please hold your chlorthalidone and avoid ibuprofen until you follow-up with your doctor.  You need to be certain to stay well hydrated.

## 2023-03-24 NOTE — Code Documentation (Signed)
Stroke Response Nurse Documentation Code Documentation  Denise Herrera is a 52 y.o. female arriving to [facility]  via [employer] [facility] on 11/5 with past medical hx of ICH, HTN, insomnia. On No antithrombotic. Code stroke was activated by ED.   Patient from home where she was LKW at 2100 and now complaining of right sided weakness.   Stroke team at the bedside on patient arrival. Labs drawn and patient cleared for CT by Dr. Madilyn Hook. Patient to CT with team. NIHSS 4, see documentation for details and code stroke times. Patient with right leg weakness, right limb ataxia, and right decreased sensation on exam. The following imaging was completed:  CT Head and CTA. Patient is not a candidate for IV Thrombolytic due to prior ICH. Patient is not a candidate for IR due to low suspicion for LVO.   Care Plan: Neuro checks q2 hrs.   Bedside handoff with ED RN Juliette Alcide.    Rose Fillers  Rapid Response RN

## 2023-03-24 NOTE — ED Notes (Signed)
Pt refusing Covid swab

## 2023-03-24 NOTE — ED Notes (Signed)
Pt taken to MRI  

## 2023-03-31 ENCOUNTER — Telehealth: Payer: Self-pay | Admitting: Internal Medicine

## 2023-03-31 NOTE — Telephone Encounter (Signed)
Patient states needs refill for Metadate ER 20 mg. Pharmacy is [facility] [address] Patient phone number is [phone].

## 2023-04-02 MED ORDER — METHYLPHENIDATE HCL ER 20 MG PO TBCR: EXTENDED_RELEASE_TABLET | ORAL | 0 refills | Status: DC

## 2023-04-02 NOTE — Telephone Encounter (Signed)
Dr. Maple Hudson, please advise on refill request.  Allergies  Allergen Reactions   Amlodipine Besylate     Dizziness, disequilibrium, fatigue Other reaction(s): Dizziness, disequilibrium and fatigue   Hydrochlorothiazide Other (See Comments)    Fatigue Laziness  Other reaction(s): fatigue/laziness   Influenza Vaccines     Other reaction(s): nausea and vomiting x 4-5 days   Lodine [Etodolac] Itching   Meloxicam     Other reaction(s): legs swell   Norvasc [Amlodipine] Other (See Comments)    Dizziness Disequilibrium Fatigue   Nuvigil [Armodafinil] Other (See Comments)    Headache    Vicodin [Hydrocodone-Acetaminophen] Itching   Lipitor [Atorvastatin] Other (See Comments)    Fatigue      Current Outpatient Medications:    acetaminophen (TYLENOL) 325 MG tablet, Take 2 tablets (650 mg total) by mouth every 4 (four) hours as needed for mild pain (or temp > 37.5 C (99.5 F))., Disp: , Rfl:    amphetamine-dextroamphetamine (ADDERALL XR) 10 MG 24 hr capsule, Take 1 capsule (10 mg total) by mouth 2 (two) times daily., Disp: 60 capsule, Rfl: 0   amphetamine-dextroamphetamine (ADDERALL) 10 MG tablet, 1 twice daily as needed, Disp: 60 tablet, Rfl: 0   atorvastatin (LIPITOR) 20 MG tablet, Take 1 tablet (20 mg total) by mouth daily., Disp: 30 tablet, Rfl: 0   baclofen (LIORESAL) 10 MG tablet, Take 0.5-1 tablets (5-10 mg total) by mouth 3 (three) times daily as needed for muscle spasms., Disp: 90 each, Rfl: 2   cetirizine (ZYRTEC) 10 MG tablet, Take 10 mg by mouth daily., Disp: , Rfl:    eletriptan (RELPAX) 40 MG tablet, SMARTSIG:1 Tablet(s) By Mouth 1-2 Times Daily, Disp: , Rfl:    eszopiclone (LUNESTA) 2 MG TABS tablet, Take 1 tablet (2 mg total) by mouth at bedtime as needed for sleep. Take immediately before bedtime (Patient not taking: Reported on 03/04/2023), Disp: 30 tablet, Rfl: 1   irbesartan (AVAPRO) 300 MG tablet, Take 1 tablet (300 mg total) by mouth daily., Disp: 30 tablet, Rfl: 0    methylphenidate (METADATE ER) 20 MG ER tablet, Twice daily as needed, Disp: 60 tablet, Rfl: 0   methylphenidate (RITALIN) 5 MG tablet, 1 tab 3 times daily if needed, Disp: 90 tablet, Rfl: 0   nebivolol (BYSTOLIC) 5 MG tablet, Take 1 tablet (5 mg total) by mouth daily., Disp: 30 tablet, Rfl: 0   pantoprazole (PROTONIX) 40 MG tablet, Take 1 tablet (40 mg total) by mouth daily., Disp: 30 tablet, Rfl: 0   traZODone (DESYREL) 50 MG tablet, TAKE 1 TO 2 TABLETS BY MOUTH AS NEEDED FOR SLEEP, Disp: 50 tablet, Rfl: 5   triamcinolone (NASACORT) 55 MCG/ACT AERO nasal inhaler, Place 2 sprays into the nose daily as needed (allergies). (Patient not taking: Reported on 03/04/2023), Disp: , Rfl:    zolpidem (AMBIEN CR) 12.5 MG CR tablet, Take 1 tablet (12.5 mg total) by mouth at bedtime as needed., Disp: 30 tablet, Rfl: 5   zolpidem (AMBIEN CR) 6.25 MG CR tablet, 2 at bedtime for sleep, Disp: 60 tablet, Rfl: 5

## 2023-04-02 NOTE — Telephone Encounter (Signed)
 Metadate refilled.

## 2023-04-20 ENCOUNTER — Telehealth: Payer: Self-pay | Admitting: Internal Medicine

## 2023-04-20 MED ORDER — TRAZODONE HCL 50 MG PO TABS: ORAL_TABLET | ORAL | 5 refills | Status: AC

## 2023-04-20 MED ORDER — AMPHETAMINE-DEXTROAMPHETAMINE 10 MG PO TABS: ORAL_TABLET | ORAL | 0 refills | Status: DC

## 2023-04-20 NOTE — Telephone Encounter (Signed)
I refilled the trazodone for sleep. Ok to take 2 tabs in one isn't enough. I can rewrite that prescription if the higher dose works better. I sent a script for Adderall 10 mg to try again, instead of the ritalin 5 mg. If this is also unavailable, let me now.

## 2023-04-20 NOTE — Telephone Encounter (Signed)
Called and spoke with patient, advised of recommendations per Dr. Maple Hudson.  She verbalized understanding.  Nothing further needed.

## 2023-04-20 NOTE — Telephone Encounter (Signed)
Called and spoke with patient, she states that she needs a refill of the desryl.  She says this does not work as well as her Ritalin, it is very subtle , she still wants to sleep past her 8 hours.  After 4 hours she wants to sleep, but it is then time for the next dose.  She can still not get her Ritalin 5 mg as it is on back order.  She is wondering if there is something else she can try that works better. Dr. Maple Hudson, please advise.  Thank you.

## 2023-05-09 ENCOUNTER — Other Ambulatory Visit: Payer: Self-pay | Admitting: Physical Medicine & Rehabilitation

## 2023-05-19 ENCOUNTER — Other Ambulatory Visit: Payer: Self-pay | Admitting: Internal Medicine

## 2023-05-20 ENCOUNTER — Telehealth: Payer: Self-pay | Admitting: Internal Medicine

## 2023-05-20 NOTE — Telephone Encounter (Signed)
 PT needs 3 RX's  Methadate 5mg  Send to [facility] on [address] ([facility])  [facility]ate 20 mg [facility] [address] (Not a repeat)  Zolpidem 12  Send to [facility] in from the [place]. TY

## 2023-05-21 NOTE — Telephone Encounter (Signed)
 Ambien refilled

## 2023-05-24 NOTE — Telephone Encounter (Signed)
 Patient needs Methadate 5mg  sent [facility] on [address]

## 2023-05-25 MED ORDER — METHYLPHENIDATE HCL 5 MG PO TABS: ORAL_TABLET | ORAL | 0 refills | Status: DC

## 2023-05-25 NOTE — Telephone Encounter (Signed)
 Methylphenidate 5 mg refilled at [facility]

## 2023-05-26 NOTE — Telephone Encounter (Signed)
 NFN

## 2023-05-27 ENCOUNTER — Telehealth: Payer: Self-pay | Admitting: Internal Medicine

## 2023-05-27 MED ORDER — METHYLPHENIDATE HCL ER 20 MG PO TBCR: EXTENDED_RELEASE_TABLET | ORAL | 0 refills | Status: DC

## 2023-05-27 MED ORDER — ZOLPIDEM TARTRATE ER 12.5 MG PO TBCR: 12.5000 mg | EXTENDED_RELEASE_TABLET | Freq: Every evening | ORAL | 5 refills | Status: DC | PRN

## 2023-05-27 NOTE — Telephone Encounter (Signed)
 I refilled methylphenidate 20 ER and zolpidem 12.5 at [facility]

## 2023-05-27 NOTE — Telephone Encounter (Signed)
 Dr. Neysa, Please see last tel encounter. PT said only the 5 mg methadate went in. Original message was as follows:  PT needs 3 RX's   Methadate 5mg  Send to [facility] on [address] (NOT [facility])   Methadate 20 mg [facility] [address] (Not a repeat)   Zolpidem  12  Send to [facility] [place]        She needs a 20 mg too she said. Please advise if you will. Thanks.

## 2023-05-28 NOTE — Telephone Encounter (Signed)
 NFN

## 2023-06-28 ENCOUNTER — Telehealth: Payer: Self-pay | Admitting: Internal Medicine

## 2023-06-28 NOTE — Telephone Encounter (Signed)
 Methylphenidate  5 mg needed at [facility]

## 2023-06-29 MED ORDER — METHYLPHENIDATE HCL 5 MG PO TABS: ORAL_TABLET | ORAL | 0 refills | Status: DC

## 2023-06-29 NOTE — Telephone Encounter (Signed)
[facility]

## 2023-07-02 ENCOUNTER — Telehealth: Payer: Self-pay | Admitting: Internal Medicine

## 2023-07-02 MED ORDER — METHYLPHENIDATE HCL 5 MG PO TABS: ORAL_TABLET | ORAL | 0 refills | Status: DC

## 2023-07-02 NOTE — Telephone Encounter (Signed)
ATC patient x1.  Left detailed message per DPR.  Routed to Dr. Maple Hudson for refill.  Dr. Maple Hudson, please advise and send back to triage.  Thank you.

## 2023-07-02 NOTE — Telephone Encounter (Signed)
Patient states Ritalin needs to go to [facility] [address]. Patient out of medication. Patient phone number is [phone].

## 2023-07-02 NOTE — Telephone Encounter (Signed)
This patient is on several similar meds. I refilled ritalin 5 mg to [facility]

## 2023-07-05 NOTE — Telephone Encounter (Signed)
 I called and spoke with patient, advised of refill per Dr. Maple Hudson.  She verbalized she had picked up her medication.  Nothing further needed.

## 2023-07-15 ENCOUNTER — Telehealth: Payer: Self-pay | Admitting: Internal Medicine

## 2023-07-15 NOTE — Telephone Encounter (Signed)
 Patient states needs refill for Methylphenidate 20 mg. [facility]acy is [facility] [address] Patient out of medication. Patient phone number is [phone].

## 2023-07-16 MED ORDER — METHYLPHENIDATE HCL ER 20 MG PO TBCR: EXTENDED_RELEASE_TABLET | ORAL | 0 refills | Status: DC

## 2023-07-16 NOTE — Telephone Encounter (Signed)
 [facility]

## 2023-07-16 NOTE — Telephone Encounter (Signed)
 Called and spoke with patient, she needs a refill of her Methylphenidate 20 mg. Patient is out of medication.  [facility] [address]  Algis Downs I would send a message to Dr. Maple Hudson and once we hear back we will call her back.  She verbalized understanding.  Dr. Maple Hudson, please advise.  Please send back to triage.  Thank you.

## 2023-07-16 NOTE — Telephone Encounter (Signed)
 Called and spoke with patient, advised that refill had been sent to pharmacy.  Nothing further needed.

## 2023-07-27 ENCOUNTER — Telehealth: Payer: Self-pay | Admitting: Internal Medicine

## 2023-07-27 NOTE — Telephone Encounter (Signed)
  Methylphenidate 5mg  for up to 3 times a day . She was only given enough for 1 a day. But she usually has it for up to 3 times a day. Please call and advise [phone]   [facility]

## 2023-07-28 NOTE — Telephone Encounter (Signed)
 Called patient.  Patient received rx sent in by Dr. Maple Hudson on 07/16/2023 methylphenidate 20 mg ER Twice daily as needed .  Patient states she has always taken med as TID.  Can Dr. Maple Hudson please resend in rx?  Dr. Maple Hudson, please advise.  Thank you.

## 2023-07-29 NOTE — Telephone Encounter (Signed)
 Called patient.  Gave information.  Patient adamant that she takes methylphenidate 5 mg tablets, 3 tablets 3 times a day.    Per Dr. Maple Hudson, have patient make OV to discuss methylphenidate dosage.  Patient to bring all medicine bottles with her to OV.  Dr. Maple Hudson has an appointment opening tomorrow at 10:30 am.  Patient accepted appointment and verbalized understanding to bring medications with her.

## 2023-07-29 NOTE — Progress Notes (Signed)
 Subjective:    Patient ID: Denise Herrera, female    DOB: [birth year], 53 y.o.   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  No acute cardiopulmonary issues. No new neurologic event. Remains out of work after CVA.  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  History of Present Illness   The patient, with a history of Narcolepsy, presents for a medication adjustment. She has been taking zolpidem for insomnia and methylphenidate 20mg  extended release twice daily for Narcolepsy. She also has a prescription for methylphenidate 5mg , which she takes up to three times daily as needed. However, she reports that she has been taking up to three 5mg  tablets at a time, depending on her symptoms, and never exceeding nine tablets a day. She also mentions that she has been able to stretch a 30-day supply of the  5mg  tablets to last 45 days.  The patient also has a prescription for Adderall, but she reports that she has not been taking it and it does not work for her. She expresses a desire to stick with methylphenidate, which has been effective for her for the past 20 years.  In addition to her Narcolepsy and insomnia, the patient also reports neck tightness and mentions that she occasionally takes trazodone for sleep when she cannot get her usual zolpidem. She also mentions a history of stroke and high blood pressure, which she has been working to control. She reports that her current blood pressure medication is effective and does not cause side effects like dizziness or lightheadedness, which she experienced with previous medications.       Review of Systems- see HPI  + = positive Constitutional:   No-   weight loss, night sweats, fevers, chills, +fatigue, lassitude. HEENT:   +  headaches, no-difficulty swallowing, tooth/dental problems, sore throat,       No-  sneezing, itching, ear ache, nasal congestion, post nasal drip,  CV:  No-   chest pain, orthopnea, PND, swelling in lower extremities, anasarca, dizziness, palpitations Resp: No-   shortness of breath with exertion or at rest.              No-   productive cough,  No non-productive cough,  No-  coughing up of blood.              No-   change in color of mucus.  No- wheezing.   Skin: No-   rash or lesions.  GI:  No-   heartburn, indigestion, abdominal pain, nausea, vomiting,  GU: .  MS:  No-   joint pain or swelling.   Neuro- nothing unusual Psych:  No- change in mood or affect. No depression or anxiety.  No memory loss    Objective:   Physical Exam General- Alert, Oriented, Affect-appropriate, Distress- none acute  Medium build. Appears well Skin- rash-none, lesions- none, excoriation- none; tatoos Lymphadenopathy- none Head- atraumatic            Eyes- Gross vision intact, PERRLA, conjunctivae clear secretions            Ears-  Hearing, canals normal            Nose- Clear, NoSeptal dev, mucus, polyps, erosion, perforation             Throat- Mallampati II , mucosa clear , drainage- none, tonsils- atrophic Neck- flexible , trachea midline, no stridor , thyroid nl, carotid no bruit Chest - symmetrical excursion , unlabored           Heart/CV- RRR , no murmur , no gallop  , no rub, nl s1 s2                           - JVD- none , edema- none, stasis changes- none, varices- none           Lung- clear to P&A, wheeze- none, cough- none , dullness-none, rub- none           Chest wall-  Abd-  Br/ Gen/ Rectal- Not done, not indicated Extrem- cyanosis- none, clubbing, none, atrophy- none, strength- nl Neuro- +mild smile asymmetry Assessment & Plan:  Assessment and Plan:    Narcolepsy without Cataplexy Prescription discrepancy for methylphenidate clarified. Current regimen effective. - Adjust methylphenidate 5 mg prescription to allow up to two tablets , up to three times daily as needed. She rarely goes that high. -Continue methylphenidate ER 20 mg twice daily - Discontinue Adderall. - Send updated prescription to pharmacy.  Insomnia Current zolpidem 12.5mg  regimen satisfactory. Trazodone available as alternative. - Continue zolpidem. - Maintain trazodone as alternative.  Hypertension Hypertension well-controlled with current regimen, no side effects. - Continue current antihypertensive regimen.  Stroke Recovering well, understands importance of managing risk factors to prevent recurrence. - Monitor for stroke recurrence and manage risk factors, particularly hypertension.  Follow-up Unnecessary to see her so soon, seen every six months to a year. - Cancel April 24 appointment. - Schedule annual follow-ups unless needed otherwise.

## 2023-07-29 NOTE — Telephone Encounter (Signed)
 According to my computer record, we have been giving 60 tabs of the methylphenidate ER 20 mg each month for the past couple of years at least, for twice daily use if needed. We have given "three times daily " dosing for 5 and 10 mg tabs in the past, but not the 20 mg ER dose.

## 2023-07-30 ENCOUNTER — Ambulatory Visit: Admitting: Internal Medicine

## 2023-07-30 ENCOUNTER — Encounter: Payer: Self-pay | Admitting: Internal Medicine

## 2023-07-30 VITALS — BP 138/78 | HR 65 | Temp 97.6°F | Ht 64.0 in | Wt 161.4 lb

## 2023-07-30 DIAGNOSIS — I10 Essential (primary) hypertension: Secondary | ICD-10-CM | POA: Diagnosis not present

## 2023-07-30 DIAGNOSIS — G4700 Insomnia, unspecified: Secondary | ICD-10-CM

## 2023-07-30 DIAGNOSIS — G47419 Narcolepsy without cataplexy: Secondary | ICD-10-CM

## 2023-07-30 MED ORDER — METHYLPHENIDATE HCL 5 MG PO TABS: ORAL_TABLET | ORAL | 0 refills | Status: DC

## 2023-07-30 NOTE — Patient Instructions (Addendum)
 We have cleaned up your medication list to remove the adderalls, the low dose zolpidem, and we have changed the 5 mg methylphenidate script to agree with the way you have been taking it when needed.  Please let me know if we need to make other changes.  Cancel the April 24 return appointment.

## 2023-08-04 ENCOUNTER — Encounter: Payer: Self-pay | Admitting: Physical Medicine & Rehabilitation

## 2023-08-04 ENCOUNTER — Encounter: Payer: 59 | Attending: Physical Medicine & Rehabilitation | Admitting: Physical Medicine & Rehabilitation

## 2023-08-04 VITALS — BP 181/109 | HR 65 | Ht 64.0 in | Wt 162.6 lb

## 2023-08-04 DIAGNOSIS — R29818 Other symptoms and signs involving the nervous system: Secondary | ICD-10-CM | POA: Insufficient documentation

## 2023-08-04 DIAGNOSIS — H905 Unspecified sensorineural hearing loss: Secondary | ICD-10-CM | POA: Diagnosis present

## 2023-08-04 DIAGNOSIS — I610 Nontraumatic intracerebral hemorrhage in hemisphere, subcortical: Secondary | ICD-10-CM | POA: Insufficient documentation

## 2023-08-04 DIAGNOSIS — R252 Cramp and spasm: Secondary | ICD-10-CM | POA: Diagnosis not present

## 2023-08-04 MED ORDER — BACLOFEN 20 MG PO TABS: 10.0000 mg | ORAL_TABLET | Freq: Three times a day (TID) | ORAL | 5 refills | Status: DC

## 2023-08-04 NOTE — Progress Notes (Signed)
 Subjective:    Patient ID: Denise Herrera, female    DOB: 28-Oct-1970, 53 y.o.   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   Since I last saw Denise Herrera she says not a lot has changed. She relaxes a lot and is not exercising, as she says she doesn' thave much motivation. She does house upkeep.   Functionally, the patient is driving and independent at home.    Sleep patterns have been solid   Bowel and bladder function are intact   From a standpoint of mood, she feels positive. Denies depression but doesn't have a lot of motivation   The patient reports that pain is generally improved although she can have spasms on the right side. 1-3 baclofen a day doesn't help. Headaches are reasonably controlled  Vision might be a little worse. She wears glassed but needs eyes checked again.   She saw ENT about her right ear, and no abnl were found.    Pain Inventory Average Pain 3 Pain Right Now 3 My pain is  varies  In the last 24 hours, has pain interfered with the following? General activity 8 Relation with others 0 Enjoyment of life 8 What TIME of day is your pain at its worst? varies Sleep (in general) Good  Pain is worse with: walking, bending, sitting, standing, and some activites Pain improves with:  percussion tool Relief from Meds: 1  Family History  Problem Relation Age of Onset   Asthma Mother    Hypertension Mother    Cancer Mother        breast cancer   Hypertension Maternal Grandfather    Diabetes Maternal Grandfather    Hypertension Maternal Grandmother    Social History   Socioeconomic History   Marital status: Married    Spouse name: Not on file   Number of children: 3   Years of education: Not on file   Highest education level: Not on file  Occupational History   Occupation: makes medical hose and prostheses  Tobacco Use   Smoking status: Never   Smokeless tobacco: Never  Vaping Use    Vaping status: Never Used  Substance and Sexual Activity   Alcohol use: No    Alcohol/week: 0.0 standard drinks of alcohol   Drug use: No   Sexual activity: Not on file  Other Topics Concern   Not on file  Social History Narrative   Not on file   Social Drivers of Health   Financial Resource Strain: Not on file  Food Insecurity: Not on file  Transportation Needs: Not on file  Physical Activity: Not on file  Stress: Not on file  Social Connections: Not on file   Past Surgical History:  Procedure Laterality Date   CESAREAN SECTION     gyn cervical procedure     Past Surgical History:  Procedure Laterality Date   CESAREAN SECTION     gyn cervical procedure     Past Medical History:  Diagnosis Date   Chronic insomnia    History of migraine headaches    Narcolepsy without cataplexy(347.00)    BP (!) 177/114   Pulse 65   Ht 5\' 4"  (1.626 m)   Wt 162 lb 9.6 oz (73.8 kg)   LMP 05/07/2018   SpO2 98%   BMI 27.91 kg/m   Opioid Risk Score:   Fall Risk Score:  `1  Depression screen [facility] 2/9  08/04/2023   10:19 AM 02/10/2023    9:45 AM 08/05/2022   10:09 AM 02/09/2022   10:01 AM 12/17/2021    2:24 PM  Depression screen PHQ 2/9  Decreased Interest 0 0 0 0 0  Down, Depressed, Hopeless 0 0 0 0 0  PHQ - 2 Score 0 0 0 0 0  Altered sleeping     0  Tired, decreased energy     0  Change in appetite     0  Feeling bad or failure about yourself      0  Trouble concentrating     0  Moving slowly or fidgety/restless     0  Suicidal thoughts     0  PHQ-9 Score     0     Review of Systems  All other systems reviewed and are negative.      Objective:   Physical Exam  General: No acute distress HEENT: NCAT, EOMI, oral membranes moist Cards: reg rate  Chest: normal effort Abdomen: Soft, NT, ND Skin: dry, intact Extremities: no edema Psych: pleasant and appropriate  Skin: intact Neuro: Alert and oriented x 3. Normal insight and awareness. Intact Memory. Normal  language and speech. Cranial nerve exam unremarkable except for mild right facial numbness. RUE grossly 4+/5 prox to distal. RLE remains 4+/5 prox to distal--sl weaker with ADF.Marland Kitchen Sensory remains 1/2 RUE and RLE. Mild right PD. Negative romberg test.  Gait still a little enbloc RLE, sometimes doesn't land on heel.  No resting tone. DTR's remain 2+ Musculoskeletal: normal rom.   reasonable posture           Assessment & Plan:  Medical Problem List and Plan: 1. Functional deficits secondary to left thalamic ICH likely secondary to hypertensive crisis             -continue with exercise, strengthening/stretches             --she's driving             -can't return to work given her ongoing weakness and sensory loss 2.   Pain Management/RUE spasms:               -will schedule baclofen 20mg  tid to better control spasms. She doesn't have resting tone.             - headaches resolved 3. Mood/Sleep/ADHD: metadate continues per primary 4. HTN: elevated---will recheck today  -may need to follow up with primary 5. Right sided hearing loss--ear canal is clear             -ENT did not find anything  -likely stroke related.     20 minutes of face to face patient care time were spent during this visit including extensive paperwork. All questions were encouraged and answered. Follow up with me in 6 mos.

## 2023-08-04 NOTE — Patient Instructions (Addendum)
 ALWAYS FEEL FREE TO CALL OUR OFFICE WITH ANY PROBLEMS OR QUESTIONS [phone])  **PLEASE NOTE** ALL MEDICATION REFILL REQUESTS (INCLUDING CONTROLLED SUBSTANCES) NEED TO BE MADE AT LEAST 7 DAYS PRIOR TO REFILL BEING DUE. ANY REFILL REQUESTS INSIDE THAT TIME FRAME MAY RESULT IN DELAYS IN RECEIVING YOUR PRESCRIPTION.     FOLLOW UP WITH DR. Azucena Cecil REGARDING YOUR BP. IF IT REMAINS ELEVATED, RECHECK IT AT HOME AND CALL HIM IF IT'S ELEVATED

## 2023-08-07 ENCOUNTER — Other Ambulatory Visit: Payer: Self-pay | Admitting: Physical Medicine & Rehabilitation

## 2023-08-16 ENCOUNTER — Encounter: Payer: Self-pay | Admitting: Internal Medicine

## 2023-09-01 ENCOUNTER — Other Ambulatory Visit: Payer: Self-pay | Admitting: Internal Medicine

## 2023-09-01 NOTE — Telephone Encounter (Unsigned)
 Copied from CRM [id number]. Topic: Clinical - Medication Refill >> Sep 01, 2023  2:11 PM Hilton Lucky wrote: Most Recent Primary Care Visit:   Medication: methylphenidate (METADATE ER) 20 MG ER tablet  Has the patient contacted their pharmacy? Yes  Is this the correct pharmacy for this prescription? Yes  This is the patient's preferred pharmacy:  [id number] [place],  - [address] AT [facility] [address] [place] [address] Phone: [phone] Fax: [phone]    Has the prescription been filled recently? No  Is the patient out of the medication? Yes  Has the patient been seen for an appointment in the last year OR does the patient have an upcoming appointment? Yes  Can we respond through MyChart? No  Agent: Please be advised that Rx refills may take up to 3 business days. We ask that you follow-up with your pharmacy.

## 2023-09-01 NOTE — Telephone Encounter (Signed)
Please advise refill request

## 2023-09-02 MED ORDER — METHYLPHENIDATE HCL ER 20 MG PO TBCR: EXTENDED_RELEASE_TABLET | ORAL | 0 refills | Status: DC

## 2023-09-02 NOTE — Telephone Encounter (Signed)
Methylphenidate refilled 

## 2023-09-03 ENCOUNTER — Ambulatory Visit: Payer: Self-pay | Admitting: Internal Medicine

## 2023-09-06 ENCOUNTER — Other Ambulatory Visit: Payer: Self-pay | Admitting: Internal Medicine

## 2023-09-06 NOTE — Telephone Encounter (Signed)
 Copied from CRM [id number]. Topic: Clinical - Medication Refill >> Sep 06, 2023  2:59 PM Tyronne Galloway wrote: Most Recent Primary Care Visit:   Medication: methylphenidate  (RITALIN ) 5 MG tablet   Has the patient contacted their pharmacy? No; patient stated she never is able to have refills left on this med and has to always call for a new prescription to be placed.  (Agent: If no, request that the patient contact the pharmacy for the refill. If patient does not wish to contact the pharmacy document the reason why and proceed with request.) (Agent: If yes, when and what did the pharmacy advise?)  Is this the correct pharmacy for this prescription? Yes If no, delete pharmacy and type the correct one.  This is the patient's preferred pharmacy:  [id number] [place], [place] - [address] AT [facility] [address]  [place] [address] Phone: [phone] Fax: [phone]  [id number] [address], [place] - [address] [place] [address] Phone: [phone] Fax: [phone]  [id number] [place], [place] - [address] AT [facility] OF [facility] & [address] [place] [address] Phone: [phone] Fax: [phone]  [id number] [place], [place] - [address] [place] [place] [address] Phone: [phone] Fax: [phone]  [id number] [place], [place] - [address] AT [facility] [address] [place] [address] Phone: [phone] Fax: [phone]   Has the prescription been filled recently? Yes  Is the patient out of the medication? No; has 1 more days worth.  Has the patient been seen for an appointment in the last year OR does the patient have an upcoming appointment? Yes  Can we respond through MyChart? No; prefer a phone call.   Agent: Please be advised that Rx refills may  take up to 3 business days. We ask that you follow-up with your pharmacy.

## 2023-09-07 MED ORDER — METHYLPHENIDATE HCL 5 MG PO TABS: ORAL_TABLET | ORAL | 0 refills | Status: DC

## 2023-09-07 NOTE — Telephone Encounter (Signed)
Methylphenidate refilled 

## 2023-09-09 ENCOUNTER — Ambulatory Visit: Payer: Self-pay | Admitting: Internal Medicine

## 2023-09-17 ENCOUNTER — Encounter: Payer: Self-pay | Admitting: Physical Medicine & Rehabilitation

## 2023-09-23 ENCOUNTER — Other Ambulatory Visit: Payer: Self-pay | Admitting: Physical Medicine & Rehabilitation

## 2023-09-23 MED ORDER — CYCLOBENZAPRINE HCL 5 MG PO TABS: 5.0000 mg | ORAL_TABLET | Freq: Three times a day (TID) | ORAL | 2 refills | Status: DC

## 2023-10-06 ENCOUNTER — Other Ambulatory Visit: Payer: Self-pay | Admitting: Internal Medicine

## 2023-10-06 NOTE — Telephone Encounter (Signed)
 Refill

## 2023-10-06 NOTE — Telephone Encounter (Signed)
 Copied from CRM [id number]. Topic: Clinical - Medication Refill >> Oct 06, 2023  2:43 PM Eveleen Hinds B wrote: Medication:  methylphenidate  (RITALIN ) 5 MG tablet methylphenidate  (METADATE  ER) 20 MG ER tablet  Has the patient contacted their pharmacy? Yes (Agent: If no, request that the patient contact the pharmacy for the refill. If patient does not wish to contact the pharmacy document the reason why and proceed with request.) (Agent: If yes, when and what did the pharmacy advise?)  This is the patient's preferred pharmacy:  [id number] - [place], [place] - [address] AT [facility] [address] [place] [address] Phone: [phone] Fax: [phone]  Is this the correct pharmacy for this prescription? Yes If no, delete pharmacy and type the correct one.   Has the prescription been filled recently? Yes  Is the patient out of the medication? No  Has the patient been seen for an appointment in the last year OR does the patient have an upcoming appointment? Yes  Can we respond through MyChart? Yes  Agent: Please be advised that Rx refills may take up to 3 business days. We ask that you follow-up with your pharmacy.

## 2023-10-06 NOTE — Telephone Encounter (Signed)
 Last Fill: Methylphenidate  5: 09/07/23 90 tabs/0 RF     Methylphenidate  20: 09/02/23 60 tabs/0 RF  Last OV: 07/30/23 Next OV: 07/28/24  Routing to provider for review/authorization.

## 2023-10-07 MED ORDER — METHYLPHENIDATE HCL ER 20 MG PO TBCR: EXTENDED_RELEASE_TABLET | ORAL | 0 refills | Status: DC

## 2023-10-07 NOTE — Telephone Encounter (Signed)
Methylphenidate refilled 

## 2023-10-28 ENCOUNTER — Telehealth: Payer: Self-pay

## 2023-10-28 NOTE — Telephone Encounter (Signed)
 Jill with [employer]  has requested a 5 day turn-around for the medical questionnaire form to be completed and faxed (received on 10/26/2023).   [employer] has been informed that Dr. Rachel Budds is currently out of the office. The forms will be reviewed  next week. Dr. Rachel Budds is scheduled to return on 11/03/2023.

## 2023-11-01 ENCOUNTER — Telehealth: Payer: Self-pay

## 2023-11-01 NOTE — Telephone Encounter (Signed)
 [employer] calling into the office requesting a copy of the response from recent documentation rec'd on 10/26/23.  I was unable to open the file and advised [employer] that it would not allow file to open.  They are resending the forms to be completed again to our office.

## 2023-11-16 ENCOUNTER — Other Ambulatory Visit: Payer: Self-pay | Admitting: Internal Medicine

## 2023-11-16 NOTE — Telephone Encounter (Signed)
**Note De-identified  [place] Obfuscation** Please advise 

## 2023-11-16 NOTE — Telephone Encounter (Unsigned)
 Copied from CRM [id number]. Topic: Clinical - Medication Refill >> Nov 16, 2023  4:18 PM Rozanna G wrote: Medication: methylphenidate  (METADATE  ER) 20 MG ER table AND methylphenidate  (RITALIN ) 5 MG tablet  Has the patient contacted their pharmacy? No (Agent: If no, request that the patient contact the pharmacy for the refill. If patient does not wish to contact the pharmacy document the reason why and proceed with request.) (Agent: If yes, when and what did the pharmacy advise?)  This is the patient's preferred pharmacy:  [id number] [place], [place] - [address] AT [facility] [address] [place] [address] Phone: [phone] Fax: [phone]   Is this the correct pharmacy for this prescription? Yes If no, delete pharmacy and type the correct one.   Has the prescription been filled recently? Yes  Is the patient out of the medication? No  Has the patient been seen for an appointment in the last year OR does the patient have an upcoming appointment? Yes  Can we respond through MyChart? No  Agent: Please be advised that Rx refills may take up to 3 business days. We ask that you follow-up with your pharmacy.

## 2023-11-17 MED ORDER — METHYLPHENIDATE HCL 5 MG PO TABS: ORAL_TABLET | ORAL | 0 refills | Status: DC

## 2023-11-17 MED ORDER — METHYLPHENIDATE HCL ER 20 MG PO TBCR: EXTENDED_RELEASE_TABLET | ORAL | 0 refills | Status: DC

## 2023-11-17 NOTE — Telephone Encounter (Signed)
 Methylphenidate scripts refilled

## 2023-11-26 ENCOUNTER — Other Ambulatory Visit: Payer: Self-pay | Admitting: Internal Medicine

## 2023-11-26 NOTE — Telephone Encounter (Signed)
 Copied from CRM [id number]. Topic: Clinical - Medication Refill >> Nov 26, 2023  4:39 PM Rilla B wrote: Medication: zolpidem  (AMBIEN  CR) 12.5 MG CR tablet  Has the patient contacted their pharmacy? Yes (Agent: If no, request that the patient contact the pharmacy for the refill. If patient does not wish to contact the pharmacy document the reason why and proceed with request.) (Agent: If yes, when and what did the pharmacy advise?)  This is the patient's preferred pharmacy:  [id number] [place], [place] - [address] AT [facility] [address] [place] [address] Phone: [phone] Fax: [phone]  Is this the correct pharmacy for this prescription? Yes If no, delete pharmacy and type the correct one.   Has the prescription been filled recently? Yes  Is the patient out of the medication? No  Has the patient been seen for an appointment in the last year OR does the patient have an upcoming appointment? Yes  Can we respond through MyChart? Yes  Agent: Please be advised that Rx refills may take up to 3 business days. We ask that you follow-up with your pharmacy.

## 2023-11-29 MED ORDER — ZOLPIDEM TARTRATE ER 12.5 MG PO TBCR: 12.5000 mg | EXTENDED_RELEASE_TABLET | Freq: Every evening | ORAL | 5 refills | Status: DC | PRN

## 2023-11-29 NOTE — Telephone Encounter (Signed)
 Ambien refilled

## 2023-11-29 NOTE — Telephone Encounter (Signed)
 Dr. Neysa please advise Ambien  refill

## 2023-12-13 ENCOUNTER — Other Ambulatory Visit: Payer: Self-pay | Admitting: Physical Medicine & Rehabilitation

## 2023-12-21 ENCOUNTER — Other Ambulatory Visit: Payer: Self-pay | Admitting: Internal Medicine

## 2023-12-21 MED ORDER — METHYLPHENIDATE HCL 5 MG PO TABS: ORAL_TABLET | ORAL | 0 refills | Status: DC

## 2023-12-21 NOTE — Telephone Encounter (Signed)
**Note De-identified  [place] Obfuscation** Please advise 

## 2023-12-21 NOTE — Telephone Encounter (Unsigned)
 Copied from CRM [id number]. Topic: Clinical - Medication Refill >> Dec 21, 2023  2:22 PM Whitney O wrote: Medication: methylphenidate  (RITALIN ) 5 MG tablet  methylphenidate  (METADATE  ER) 20 MG ER tablet  Has the patient contacted their pharmacy? Yes no refills  (Agent: If no, request that the patient contact the pharmacy for the refill. If patient does not wish to contact the pharmacy document the reason why and proceed with request.) (Agent: If yes, when and what did the pharmacy advise?)  This is the patient's preferred pharmacy:  [id number] - [place], [place] - [address] AT [facility] [address] [place] [address] Phone: [phone] Fax: [phone]  [id number] - [address], [place] - [address] [place] [address] Phone: [phone] Fax: [phone]  [id number] [place], [place] - [address] AT [facility] OF [facility] & [address] [place] [address] Phone: [phone] Fax: [phone]  [facility] - [place], [place] - [address] [place] [address] Phone: [phone] Fax: [phone]  [id number] [place], [place] - [address] AT [facility] OF [address] & [address] [place] [address] Phone: [phone] Fax: [phone]  Is this the correct pharmacy for this prescription? Yes If no, delete pharmacy and type the correct one.  [id number] [place], [place] - [address] AT [facility] [address] [place] [address] Phone: [phone] Fax: [phone]   Has the prescription been filled recently? No  Is the patient out of the medication? No 2 more days left   Has the patient been seen for an appointment in the last year OR does the patient have an upcoming appointment? Yes 07/30/2023  Can we respond through  MyChart? No  Agent: Please be advised that Rx refills may take up to 3 business days. We ask that you follow-up with your pharmacy.

## 2023-12-21 NOTE — Telephone Encounter (Signed)
Methylphenidate refilled 

## 2023-12-27 ENCOUNTER — Other Ambulatory Visit: Payer: Self-pay | Admitting: Internal Medicine

## 2023-12-27 NOTE — Telephone Encounter (Signed)
**Note De-identified  [place] Obfuscation** Please advise 

## 2023-12-27 NOTE — Telephone Encounter (Unsigned)
 Copied from CRM [id number]. Topic: Clinical - Medication Refill >> Dec 27, 2023  2:08 PM Dustin F wrote: Medication: methylphenidate  (METADATE  ER) 20 MG ER tablet   Has the patient contacted their pharmacy? Yes; never had the prescription sent in for the pt. (Agent: If no, request that the patient contact the pharmacy for the refill. If patient does not wish to contact the pharmacy document the reason why and proceed with request.) (Agent: If yes, when and what did the pharmacy advise?)  This is the patient's preferred pharmacy:  [id number] [place], [place] - [address] AT [facility] [address] [place] [address] Phone: [phone] Fax: [phone]  Is this the correct pharmacy for this prescription? Yes If no, delete pharmacy and type the correct one.   Has the prescription been filled recently? Yes  Is the patient out of the medication? Yes  Has the patient been seen for an appointment in the last year OR does the patient have an upcoming appointment? Yes  Can we respond through MyChart? Yes  Agent: Please be advised that Rx refills may take up to 3 business days. We ask that you follow-up with your pharmacy.

## 2023-12-30 ENCOUNTER — Telehealth (HOSPITAL_BASED_OUTPATIENT_CLINIC_OR_DEPARTMENT_OTHER): Payer: Self-pay

## 2023-12-30 DIAGNOSIS — G47419 Narcolepsy without cataplexy: Secondary | ICD-10-CM

## 2023-12-30 MED ORDER — METHYLPHENIDATE HCL ER 20 MG PO TBCR: EXTENDED_RELEASE_TABLET | ORAL | 0 refills | Status: DC

## 2023-12-30 NOTE — Telephone Encounter (Signed)
 Mailbox is full unable to leave pt a message to notify rx has been sent

## 2023-12-30 NOTE — Telephone Encounter (Signed)
 PDMP reviewed. Methylphenidate  20 mg ER was never filled/sent. Rx sent today. Last OV reviewed. Advised to continue medication and no documented side effects or aberrant behavior. Pt should continue to monitor for side effects and notify if these occur. Future refills will need to come from Dr. Neysa. Thanks.

## 2023-12-30 NOTE — Telephone Encounter (Signed)
 Copied from CRM [id number]. Topic: Clinical - Prescription Issue >> Dec 29, 2023 11:10 AM Russell PARAS wrote: Reason for CRM:   Pt is contacting clinic regarding her methylphenidate  (METADATE  ER) 20 MG ER tablet. She contacted clinic on 08/5, requesting refills for this medication along with methylphenidate  (RITALIN ) 5 MG tablet.  The methylphenidate  (RITALIN ) 5 MG tablet was sent to pharmacy; however, the methylphenidate  (METADATE  ER) 20 MG ER tablet was not.   Another refill request was placed on 8/11, advised pt that it can take up to 3 days to process; however, she is frustrated due to starting the request on 08/5.  Requests call back with update on prescription status  CB#  [phone]

## 2023-12-31 NOTE — Telephone Encounter (Signed)
 Pt.notified

## 2024-01-31 ENCOUNTER — Other Ambulatory Visit: Payer: Self-pay | Admitting: Internal Medicine

## 2024-01-31 DIAGNOSIS — G47419 Narcolepsy without cataplexy: Secondary | ICD-10-CM

## 2024-01-31 NOTE — Telephone Encounter (Unsigned)
 Copied from CRM [id number]. Topic: Clinical - Medication Refill >> Jan 31, 2024 11:34 AM Essie A wrote: Medication: methylphenidate  (RITALIN ) 5 MG tablet, methylphenidate  (METADATE  ER) 20 MG ER tablet  Has the patient contacted their pharmacy? Yes (Agent: If no, request that the patient contact the pharmacy for the refill. If patient does not wish to contact the pharmacy document the reason why and proceed with request.) (Agent: If yes, when and what did the pharmacy advise?)  This is the patient's preferred pharmacy:  [id number] - [place], [place] - [address] AT [facility] [address] [place] [address] Phone: [phone] Fax: [phone]  Is this the correct pharmacy for this prescription? Yes If no, delete pharmacy and type the correct one.   Has the prescription been filled recently? Yes  Is the patient out of the medication? Yes, has 1 day left  Has the patient been seen for an appointment in the last year OR does the patient have an upcoming appointment? Yes  Can we respond through MyChart? No  Agent: Please be advised that Rx refills may take up to 3 business days. We ask that you follow-up with your pharmacy.

## 2024-02-01 MED ORDER — METHYLPHENIDATE HCL 5 MG PO TABS: ORAL_TABLET | ORAL | 0 refills | Status: DC

## 2024-02-01 MED ORDER — METHYLPHENIDATE HCL ER 20 MG PO TBCR: EXTENDED_RELEASE_TABLET | ORAL | 0 refills | Status: DC

## 2024-02-01 NOTE — Telephone Encounter (Signed)
**Note De-identified  [place] Obfuscation** Please advise 

## 2024-02-01 NOTE — Telephone Encounter (Signed)
 Dr. Saundra pt. Please send to him. Thanks!

## 2024-02-01 NOTE — Telephone Encounter (Signed)
 methylphenidate  scripts refilled

## 2024-02-02 ENCOUNTER — Encounter: Payer: Self-pay | Admitting: Physical Medicine & Rehabilitation

## 2024-02-02 ENCOUNTER — Encounter: Attending: Physical Medicine & Rehabilitation | Admitting: Physical Medicine & Rehabilitation

## 2024-02-02 VITALS — BP 150/100 | HR 82 | Ht 64.0 in | Wt 161.2 lb

## 2024-02-02 DIAGNOSIS — R29818 Other symptoms and signs involving the nervous system: Secondary | ICD-10-CM | POA: Insufficient documentation

## 2024-02-02 DIAGNOSIS — I610 Nontraumatic intracerebral hemorrhage in hemisphere, subcortical: Secondary | ICD-10-CM | POA: Insufficient documentation

## 2024-02-02 NOTE — Progress Notes (Signed)
 Subjective:    Patient ID: Denise Herrera, female    DOB: 1970/11/06, 53 y.o.   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  I last saw her in March. She's had a good summer. She went on a cruise in May to the [place] which was really fun. For exercise she works around the house, she goes to the gym 2-3 x per week. She drives without any issues. Her grandkids are 7 and 14. She also has a lot of other stepkids and neices and nephews.   Spasms are controlled. She remains on baclofen  15mg  3x daily, cyclobrenzaprine 2-3 x daily. She doesn't feel that flexeril  helps a great deal.   She remains on metadate  and ritalin  per primary   Sleep is fair, she prefers ambien  cr which seems to work for her.  She'll use trazodone  occasionally  Pain Inventory Average Pain 0 Pain Right Now 0 My pain is no pain  In the last 24 hours, has pain interfered with the following? General activity 4 Relation with others 6 Enjoyment of life 9 What TIME of day is your pain at its worst? varies Sleep (in general) Fair  Pain is worse with: no pain Pain improves with: no pain Relief from Meds: 8  Family History  Problem Relation Age of Onset   Asthma Mother    Hypertension Mother    Cancer Mother        breast cancer   Hypertension Maternal Grandfather    Diabetes Maternal Grandfather    Hypertension Maternal Grandmother    Social History   Socioeconomic History   Marital status: Married    Spouse name: Not on file   Number of children: 3   Years of education: Not on file   Highest education level: Not on file  Occupational History   Occupation: makes medical hose and prostheses  Tobacco Use   Smoking status: Never   Smokeless tobacco: Never  Vaping Use   Vaping status: Never Used  Substance and Sexual Activity   Alcohol use: No    Alcohol/week: 0.0 standard drinks of alcohol   Drug use: No   Sexual activity: Not on file  Other Topics  Concern   Not on file  Social History Narrative   Not on file   Social Drivers of Health   Financial Resource Strain: Not on file  Food Insecurity: Not on file  Transportation Needs: Not on file  Physical Activity: Not on file  Stress: Not on file  Social Connections: Not on file   Past Surgical History:  Procedure Laterality Date   CESAREAN SECTION     gyn cervical procedure     Past Surgical History:  Procedure Laterality Date   CESAREAN SECTION     gyn cervical procedure     Past Medical History:  Diagnosis Date   Chronic insomnia    History of migraine headaches    Narcolepsy without cataplexy(347.00)    BP (!) 160/106   Pulse 82   Ht 5' 4 (1.626 m)   Wt 161 lb 3.2 oz (73.1 kg)   LMP 05/07/2018   SpO2 94%   BMI 27.67 kg/m   Opioid Risk Score:   Fall Risk Score:  `1  Depression screen [facility] 2/9     02/02/2024   10:56 AM 08/04/2023   10:19 AM 02/10/2023    9:45 AM 08/05/2022   10:09 AM 02/09/2022   10:01 AM 12/17/2021  2:24 PM  Depression screen PHQ 2/9  Decreased Interest 0 0 0 0 0 0  Down, Depressed, Hopeless 0 0 0 0 0 0  PHQ - 2 Score 0 0 0 0 0 0  Altered sleeping      0  Tired, decreased energy      0  Change in appetite      0  Feeling bad or failure about yourself       0  Trouble concentrating      0  Moving slowly or fidgety/restless      0  Suicidal thoughts      0  PHQ-9 Score      0    Review of Systems  All other systems reviewed and are negative.      Objective:   Physical Exam  General: No acute distress HEENT: NCAT, EOMI, oral membranes moist Cards: reg rate  Chest: normal effort Abdomen: Soft, NT, ND Skin: dry, intact Extremities: no edema Psych: pleasant and appropriate  Skin: intact Neuro: Alert and oriented x 3. Normal insight and awareness. Intact Memory. Normal language and speech. Cranial nerve exam unremarkable except for mild right facial numbness. RUE grossly 4+/5 prox to distal. RLE remains 4+/5 prox to distal. ..  Sensory remains 1/2 RUE and RLE. Mild right PD. Gait still a little enbloc on right. No resting tone. DTR's remain 2+ Musculoskeletal: normal rom.   reasonable posture           Assessment & Plan:  Medical Problem List and Plan: 1. Functional deficits secondary to left thalamic ICH likely secondary to hypertensive crisis             -continue with exercise, strengthening/stretches  -discussed gait mechanics             -she's driving without issue             -can't return to work given her ongoing weakness and sensory loss 2.   Pain Management/RUE spasms:  -stop flexeril                -continue baclofen  15mg  tid             - headaches resolved 3. Mood/Sleep/ADHD: metadate  continues per primary 4. HTN: elevated again today. Her PCP is following and recent check was ok  5. Right sided hearing loss--ear canal is clear             -ENT did not find anything             -likely stroke related.     15 minutes of face to face patient care time were spent during this visit including extensive paperwork. All questions were encouraged and answered. Follow up with me in 6 mos.

## 2024-02-02 NOTE — Patient Instructions (Addendum)
 ALWAYS FEEL FREE TO CALL OUR OFFICE WITH ANY PROBLEMS OR QUESTIONS [phone])  **PLEASE NOTE** ALL MEDICATION REFILL REQUESTS (INCLUDING CONTROLLED SUBSTANCES) NEED TO BE MADE AT LEAST 7 DAYS PRIOR TO REFILL BEING DUE. ANY REFILL REQUESTS INSIDE THAT TIME FRAME MAY RESULT IN DELAYS IN RECEIVING YOUR PRESCRIPTION.    STOP CYCLOBENZAPRINE  (MUSCLE RELAXANT)

## 2024-03-08 ENCOUNTER — Other Ambulatory Visit: Payer: Self-pay | Admitting: Internal Medicine

## 2024-03-08 DIAGNOSIS — G47419 Narcolepsy without cataplexy: Secondary | ICD-10-CM

## 2024-03-08 MED ORDER — METHYLPHENIDATE HCL 5 MG PO TABS: ORAL_TABLET | ORAL | 0 refills | Status: DC

## 2024-03-08 MED ORDER — METHYLPHENIDATE HCL ER 20 MG PO TBCR: EXTENDED_RELEASE_TABLET | ORAL | 0 refills | Status: DC

## 2024-03-08 NOTE — Telephone Encounter (Signed)
 Pt requesting refill of controlled medications - please advise, thank you!  LOV: 07/30/23

## 2024-03-08 NOTE — Telephone Encounter (Signed)
 Copied from CRM #[id number]. Topic: Clinical - Medication Refill >> Mar 08, 2024  2:35 PM Devaughn S wrote: Medication: methylphenidate  (METADATE  ER) 20 MG ER tablet and methylphenidate  (RITALIN ) 5 MG tablet  Has the patient contacted their pharmacy? Yes (Agent: If no, request that the patient contact the pharmacy for the refill. If patient does not wish to contact the pharmacy document the reason why and proceed with request.) (Agent: If yes, when and what did the pharmacy advise?)  This is the patient's preferred pharmacy:  [id number] - [place], [place] - [address] AT [facility] [address] [place] [address] Phone: [phone] Fax: [phone]    Is this the correct pharmacy for this prescription? Yes If no, delete pharmacy and type the correct one.   Has the prescription been filled recently? No  Is the patient out of the medication? Yes  Has the patient been seen for an appointment in the last year OR does the patient have an upcoming appointment? Yes  Can we respond through MyChart? No  Agent: Please be advised that Rx refills may take up to 3 business days. We ask that you follow-up with your pharmacy.

## 2024-03-08 NOTE — Telephone Encounter (Signed)
 Methylphenidate scripts refilled

## 2024-03-27 ENCOUNTER — Other Ambulatory Visit: Payer: Self-pay | Admitting: Physical Medicine & Rehabilitation

## 2024-04-07 ENCOUNTER — Other Ambulatory Visit: Payer: Self-pay | Admitting: Internal Medicine

## 2024-04-07 DIAGNOSIS — G47419 Narcolepsy without cataplexy: Secondary | ICD-10-CM

## 2024-04-07 NOTE — Telephone Encounter (Unsigned)
 Copied from CRM [id number]. Topic: Clinical - Medication Refill >> Apr 07, 2024  9:25 AM Benton O wrote: Medication: methylphenidate  (METADATE  ER) 20 MG ER tablet  methylphenidate  (RITALIN ) 5 MG tablet  Has the patient contacted their pharmacy? Yes pharmacy told her to reach out to doctor  (Agent: If no, request that the patient contact the pharmacy for the refill. If patient does not wish to contact the pharmacy document the reason why and proceed with request.) (Agent: If yes, when and what did the pharmacy advise?)  This is the patient's preferred pharmacy:  [id number] - [place], [place] - [address] AT [facility] [address] [place] [address] Phone: [phone] Fax: [phone]  Is this the correct pharmacy for this prescription? Yes If no, delete pharmacy and type the correct one.  [id number] [place], [place] - [address] AT [facility] [address] [place] [address] Phone: [phone] Fax: [phone]   Has the prescription been filled recently? No  Is the patient out of the medication? Yes today is last day for both   Has the patient been seen for an appointment in the last year OR does the patient have an upcoming appointment? Yes  Can we respond through MyChart? No  Agent: Please be advised that Rx refills may take up to 3 business days. We ask that you follow-up with your pharmacy.

## 2024-04-07 NOTE — Telephone Encounter (Signed)
 I've never seen this pt before. Refilled once previously as APP of the day. Needs to go to Dr. Neysa as this is controlled and his pt. Thanks.

## 2024-04-07 NOTE — Telephone Encounter (Signed)
 Pt/pharm requesting refills on : methylphenidate  (METADATE  ER) 20 MG ER tablet  methylphenidate  (RITALIN ) 5 MG tablet  Please advise

## 2024-04-11 MED ORDER — METHYLPHENIDATE HCL 5 MG PO TABS: ORAL_TABLET | ORAL | 0 refills | Status: DC

## 2024-04-11 MED ORDER — METHYLPHENIDATE HCL ER 20 MG PO TBCR: EXTENDED_RELEASE_TABLET | ORAL | 0 refills | Status: DC

## 2024-04-11 NOTE — Telephone Encounter (Signed)
 Methylphenidate scripts refilled

## 2024-04-11 NOTE — Telephone Encounter (Signed)
 Copied from CRM #[id number]. Topic: General - Other >> Apr 10, 2024  4:52 PM Rilla B wrote: Reason for CRM: Patient calling to check the status of his narcolepsy methylphenidate  prescription.  Please call patient at [phone]   Please advise Dr. Neysa

## 2024-05-09 ENCOUNTER — Other Ambulatory Visit: Payer: Self-pay | Admitting: Internal Medicine

## 2024-05-09 DIAGNOSIS — G47419 Narcolepsy without cataplexy: Secondary | ICD-10-CM

## 2024-05-09 NOTE — Telephone Encounter (Unsigned)
 Copied from CRM #[id number]. Topic: Clinical - Medication Refill >> May 09, 2024  4:25 PM Isabell A wrote: Medication: methylphenidate  (RITALIN ) 5 MG tablet [[id number]]  methylphenidate  (METADATE  ER) 20 MG ER tablet [[id number]]   Has the patient contacted their pharmacy? No (Agent: If no, request that the patient contact the pharmacy for the refill. If patient does not wish to contact the pharmacy document the reason why and proceed with request.) (Agent: If yes, when and what did the pharmacy advise?)  This is the patient's preferred pharmacy:  [id number] - [place], [place] - [address] AT [facility] [address] [place] [address] Phone: [phone] Fax: [phone]   Is this the correct pharmacy for this prescription? Yes If no, delete pharmacy and type the correct one.   Has the prescription been filled recently? Yes  Is the patient out of the medication? No  Has the patient been seen for an appointment in the last year OR does the patient have an upcoming appointment? Yes  Can we respond through MyChart? No  Agent: Please be advised that Rx refills may take up to 3 business days. We ask that you follow-up with your pharmacy.

## 2024-05-10 MED ORDER — METHYLPHENIDATE HCL ER 20 MG PO TBCR: EXTENDED_RELEASE_TABLET | ORAL | 0 refills | Status: DC

## 2024-05-10 MED ORDER — METHYLPHENIDATE HCL 5 MG PO TABS: ORAL_TABLET | ORAL | 0 refills | Status: DC

## 2024-05-10 NOTE — Telephone Encounter (Signed)
 Please advise regarding refills Dr. Neysa Thank you!

## 2024-05-10 NOTE — Telephone Encounter (Signed)
 Methylphenidate scripts refilled

## 2024-05-24 ENCOUNTER — Encounter: Payer: Self-pay | Admitting: Internal Medicine

## 2024-06-14 ENCOUNTER — Other Ambulatory Visit: Payer: Self-pay | Admitting: Internal Medicine

## 2024-06-16 ENCOUNTER — Telehealth: Payer: Self-pay

## 2024-06-16 NOTE — Telephone Encounter (Signed)
 A telephone encounter has been created for this refill request.  Will close this encounter.

## 2024-06-16 NOTE — Telephone Encounter (Unsigned)
 Copied from CRM #[id number]. Topic: Clinical - Medication Refill >> Jun 14, 2024  3:44 PM Dedra B wrote: Medication: zolpidem  (AMBIEN  CR) 12.5 MG CR tablet methylphenidate  (RITALIN ) 5 MG tablet methylphenidate  (METADATE  ER) 20 MG ER tablet   Has the patient contacted their pharmacy? Yes, no refills  This is the patient's preferred pharmacy:  [id number] - [place], [place] - [address] AT [facility] [address] [place] [address] Phone: [phone] Fax: [phone]  Is this the correct pharmacy for this prescription? Yes  Has the prescription been filled recently? No  Is the patient out of the medication? Yes  Has the patient been seen for an appointment in the last year OR does the patient have an upcoming appointment? Yes  Can we respond through MyChart? No, call instead  Agent: Please be advised that Rx refills may take up to 3 business days. We ask that you follow-up with your pharmacy.

## 2024-06-19 ENCOUNTER — Telehealth (HOSPITAL_BASED_OUTPATIENT_CLINIC_OR_DEPARTMENT_OTHER): Payer: Self-pay

## 2024-06-19 NOTE — Telephone Encounter (Signed)
 Duplicate    Copied from CRM #[id number]. Topic: Clinical - Medication Refill >> Jun 14, 2024  3:44 PM Dedra B wrote: Medication: zolpidem  (AMBIEN  CR) 12.5 MG CR tablet methylphenidate  (RITALIN ) 5 MG tablet methylphenidate  (METADATE  ER) 20 MG ER tablet   Has the patient contacted their pharmacy? Yes, no refills  This is the patient's preferred pharmacy:  [id number] - [place], [place] - [address] AT [facility] [address] [place] [address] Phone: [phone] Fax: [phone]  Is this the correct pharmacy for this prescription? Yes  Has the prescription been filled recently? No  Is the patient out of the medication? Yes  Has the patient been seen for an appointment in the last year OR does the patient have an upcoming appointment? Yes  Can we respond through MyChart? No, call instead  Agent: Please be advised that Rx refills may take up to 3 business days. We ask that you follow-up with your pharmacy. >> Jun 16, 2024  3:55 PM Leila BROCKS wrote: Patient [phone] states called yesterday for medications refills for Medication: zolpidem  (AMBIEN  CR) 12.5 MG CR tablet, methylphenidate  (RITALIN ) 5 MG tablet, and methylphenidate  (METADATE  ER) 20 MG ER tablet. Patient will be out of methylphenidate  (METADATE  ER) 20 MG ER tablet tomorrow.  Informed patient, Rx refills may take up to 3 business days. We ask that you follow-up with your pharmacy in a week in advance to prevent any delays in medication(s) refill. The office is closed at 2:30 pm on Fridays. Please advise and call back when this is done.   [id number] [place],  - [address] AT [facility] [address] [place] [address] Phone: [phone] Fax: [phone]

## 2024-06-21 ENCOUNTER — Telehealth: Payer: Self-pay

## 2024-06-21 ENCOUNTER — Other Ambulatory Visit: Payer: Self-pay | Admitting: Physical Medicine & Rehabilitation

## 2024-06-21 DIAGNOSIS — R252 Cramp and spasm: Secondary | ICD-10-CM

## 2024-06-21 NOTE — Telephone Encounter (Unsigned)
 Copied from CRM #[id number]. Topic: Clinical - Prescription Issue >> Jun 21, 2024 10:30 AM Russell PARAS wrote: Reason for CRM:   Pt requested refills of below medications on 06/15/23. [facility] Checked status and appears awaiting on provider signature.  zolpidem  (AMBIEN  CR) 12.5 MG CR tablet methylphenidate  (RITALIN ) 5 MG tablet methylphenidate  (METADATE  ER) 20 MG ER tablet  She needs these medications to function in daily life and is upset it has been delayed.   CB#  [phone]

## 2024-06-22 ENCOUNTER — Telehealth: Payer: Self-pay

## 2024-06-22 ENCOUNTER — Other Ambulatory Visit: Payer: Self-pay | Admitting: Pulmonary Disease

## 2024-06-22 DIAGNOSIS — G47419 Narcolepsy without cataplexy: Secondary | ICD-10-CM

## 2024-06-22 MED ORDER — ZOLPIDEM TARTRATE ER 12.5 MG PO TBCR: 12.5000 mg | EXTENDED_RELEASE_TABLET | Freq: Every evening | ORAL | 5 refills | Status: AC | PRN

## 2024-06-22 MED ORDER — METHYLPHENIDATE HCL ER 20 MG PO TBCR: EXTENDED_RELEASE_TABLET | ORAL | 0 refills | Status: AC

## 2024-06-22 MED ORDER — METHYLPHENIDATE HCL 5 MG PO TABS: ORAL_TABLET | ORAL | 0 refills | Status: AC

## 2024-06-22 NOTE — Telephone Encounter (Signed)
 Patient is calling back because she is in need of her prescriptions and no one has fill them yet . Patient use to se dr young but I guess now she is seeing dr stretch . Please give patient a call concerning her prescriptions being filled . Patient says is there other doctors to fill this at the moment . Patient has called yesterday asa well and look like a refill request was sent in on the 28th  [phone]

## 2024-06-22 NOTE — Telephone Encounter (Signed)
 I called and spoke with the patient and advised her that her Methylphenidate  (Metadate  ER) 20 mg ER, Methylphenidate  (Ritalin ) 5 mg and Zolpidem  (ambien  CR) 12.5 mg CR had been sent in for her.  She checked and she had been notified that they were ready at the pharmacy.  Nothing further needed.

## 2024-06-22 NOTE — Telephone Encounter (Signed)
 Patient said its the three prescriptions and spoke with mr austin upfront and he was able to speak with the dr nurse and dr o said he would send them in temporary until she is seen on the 12th with dr stretch

## 2024-06-23 NOTE — Telephone Encounter (Signed)
 NFN

## 2024-06-29 ENCOUNTER — Encounter: Admitting: Pulmonary Disease

## 2024-07-28 ENCOUNTER — Ambulatory Visit: Admitting: Internal Medicine

## 2024-08-02 ENCOUNTER — Ambulatory Visit: Admitting: Physical Medicine & Rehabilitation

## 2026-08-12 ENCOUNTER — Ambulatory Visit: Payer: BLUE CROSS/BLUE SHIELD | Admitting: Adult Health
